# Patient Record
Sex: Male | Born: 1940 | Race: White | Hispanic: No | Marital: Married | Smoking: Former smoker
Health system: Southern US, Community
[De-identification: ages and names within clinical notes are randomized; demographics above are authoritative.]

## PROBLEM LIST (undated history)

## (undated) DIAGNOSIS — C679 Malignant neoplasm of bladder, unspecified: Secondary | ICD-10-CM

## (undated) DIAGNOSIS — G039 Meningitis, unspecified: Secondary | ICD-10-CM

## (undated) DIAGNOSIS — F0390 Unspecified dementia without behavioral disturbance: Secondary | ICD-10-CM

## (undated) DIAGNOSIS — Z789 Other specified health status: Secondary | ICD-10-CM

## (undated) DIAGNOSIS — Z87442 Personal history of urinary calculi: Secondary | ICD-10-CM

## (undated) DIAGNOSIS — N189 Chronic kidney disease, unspecified: Secondary | ICD-10-CM

## (undated) HISTORY — PX: OTHER SURGICAL HISTORY: SHX169

## (undated) HISTORY — PX: HERNIA REPAIR: SHX51

## (undated) HISTORY — PX: TONSILLECTOMY: SUR1361

---

## 2002-12-02 ENCOUNTER — Ambulatory Visit (HOSPITAL_BASED_OUTPATIENT_CLINIC_OR_DEPARTMENT_OTHER): Admission: RE | Admit: 2002-12-02 | Discharge: 2002-12-02 | Payer: Self-pay | Admitting: Surgery

## 2006-09-10 HISTORY — PX: OTHER SURGICAL HISTORY: SHX169

## 2016-12-20 ENCOUNTER — Other Ambulatory Visit: Payer: Self-pay | Admitting: Gastroenterology

## 2017-02-25 ENCOUNTER — Encounter (HOSPITAL_COMMUNITY): Payer: Self-pay | Admitting: *Deleted

## 2017-02-26 ENCOUNTER — Ambulatory Visit (HOSPITAL_COMMUNITY): Payer: Medicare Other | Admitting: Anesthesiology

## 2017-02-26 ENCOUNTER — Ambulatory Visit (HOSPITAL_COMMUNITY)
Admission: RE | Admit: 2017-02-26 | Discharge: 2017-02-26 | Disposition: A | Discharge status: Home / Self Care | Payer: Medicare Other | Source: Ambulatory Visit | Attending: Gastroenterology | Admitting: Gastroenterology

## 2017-02-26 ENCOUNTER — Encounter (HOSPITAL_COMMUNITY): Payer: Self-pay | Admitting: Emergency Medicine

## 2017-02-26 ENCOUNTER — Encounter (HOSPITAL_COMMUNITY)
Admission: RE | Disposition: A | Discharge status: Home / Self Care | Payer: Self-pay | Source: Ambulatory Visit | Attending: Gastroenterology

## 2017-02-26 DIAGNOSIS — Z1211 Encounter for screening for malignant neoplasm of colon: Secondary | ICD-10-CM | POA: Diagnosis not present

## 2017-02-26 DIAGNOSIS — Z79899 Other long term (current) drug therapy: Secondary | ICD-10-CM | POA: Insufficient documentation

## 2017-02-26 DIAGNOSIS — Z87891 Personal history of nicotine dependence: Secondary | ICD-10-CM | POA: Insufficient documentation

## 2017-02-26 HISTORY — PX: COLONOSCOPY WITH PROPOFOL: SHX5780

## 2017-02-26 HISTORY — DX: Other specified health status: Z78.9

## 2017-02-26 SURGERY — COLONOSCOPY WITH PROPOFOL
Anesthesia: Monitor Anesthesia Care

## 2017-02-26 MED ORDER — ONDANSETRON HCL 4 MG/2ML IJ SOLN
INTRAMUSCULAR | Status: AC
  Filled 2017-02-26: qty 2

## 2017-02-26 MED ORDER — LIDOCAINE HCL (CARDIAC) 20 MG/ML IV SOLN
INTRAVENOUS | Status: DC | PRN
  Administered 2017-02-26: 50 mg via INTRAVENOUS

## 2017-02-26 MED ORDER — PROPOFOL 10 MG/ML IV BOLUS
INTRAVENOUS | Status: DC | PRN
  Administered 2017-02-26: 50 mg via INTRAVENOUS

## 2017-02-26 MED ORDER — SODIUM CHLORIDE 0.9 % IV SOLN: INTRAVENOUS | Status: DC

## 2017-02-26 MED ORDER — PROPOFOL 10 MG/ML IV BOLUS
INTRAVENOUS | Status: AC
  Filled 2017-02-26: qty 40

## 2017-02-26 MED ORDER — LACTATED RINGERS IV SOLN
INTRAVENOUS | Status: DC
  Administered 2017-02-26 (×2): via INTRAVENOUS

## 2017-02-26 MED ORDER — LIDOCAINE 2% (20 MG/ML) 5 ML SYRINGE
INTRAMUSCULAR | Status: AC
  Filled 2017-02-26: qty 5

## 2017-02-26 MED ORDER — ONDANSETRON HCL 4 MG/2ML IJ SOLN
INTRAMUSCULAR | Status: DC | PRN
  Administered 2017-02-26: 4 mg via INTRAVENOUS

## 2017-02-26 MED ORDER — PROPOFOL 500 MG/50ML IV EMUL
INTRAVENOUS | Status: DC | PRN
  Administered 2017-02-26: 150 ug/kg/min via INTRAVENOUS

## 2017-02-26 SURGICAL SUPPLY — 22 items

## 2017-02-26 NOTE — Anesthesia Postprocedure Evaluation (Signed)
Anesthesia Post Note  Patient: Benjamin Hensley  Procedure(s) Performed: Procedure(s) (LRB): COLONOSCOPY WITH PROPOFOL (N/A)     Patient location during evaluation: PACU Anesthesia Type: MAC Level of consciousness: awake and alert Pain management: pain level controlled Vital Signs Assessment: post-procedure vital signs reviewed and stable Respiratory status: spontaneous breathing, nonlabored ventilation, respiratory function stable and patient connected to nasal cannula oxygen Cardiovascular status: stable and blood pressure returned to baseline Anesthetic complications: no    Last Vitals:  Vitals:   02/26/17 1030 02/26/17 1040  BP: 118/70 117/66  Pulse: (!) 55 (!) 57  Resp: 15 14  Temp:      Last Pain:  Vitals:   02/26/17 1023  TempSrc: Oral  PainSc:                  [place] Journey S

## 2017-02-26 NOTE — Transfer of Care (Signed)
Immediate Anesthesia Transfer of Care Note  Patient: Benjamin Hensley  Procedure(s) Performed: Procedure(s): COLONOSCOPY WITH PROPOFOL (N/A)  Patient Location: PACU  Anesthesia Type:MAC  Level of Consciousness:  sedated, patient cooperative and responds to stimulation  Airway & Oxygen Therapy:Patient Spontanous Breathing and Patient connected to face mask oxgen  Post-op Assessment:  Report given to PACU RN and Post -op Vital signs reviewed and stable  Post vital signs:  Reviewed and stable  Last Vitals:  Vitals:   02/26/17 0909  BP: (!) 154/68  Pulse: (!) 59  Resp: 12  Temp: 30.8 C    Complications: No apparent anesthesia complications

## 2017-02-26 NOTE — Anesthesia Preprocedure Evaluation (Signed)
Anesthesia Evaluation  Patient identified by MRN, date of birth, ID band Patient awake    Reviewed: Allergy & Precautions, NPO status , Patient's Chart, lab work & pertinent test results  Airway Mallampati: II  TM Distance: >3 FB Neck ROM: Full    Dental no notable dental hx.    Pulmonary neg pulmonary ROS, former smoker,    Pulmonary exam normal breath sounds clear to auscultation       Cardiovascular negative cardio ROS Normal cardiovascular exam Rhythm:Regular Rate:Normal     Neuro/Psych negative neurological ROS  negative psych ROS   GI/Hepatic negative GI ROS, Neg liver ROS,   Endo/Other  negative endocrine ROS  Renal/GU negative Renal ROS  negative genitourinary   Musculoskeletal negative musculoskeletal ROS (+)   Abdominal   Peds negative pediatric ROS (+)  Hematology negative hematology ROS (+)   Anesthesia Other Findings   Reproductive/Obstetrics negative OB ROS                             Anesthesia Physical Anesthesia Plan  ASA: II  Anesthesia Plan: MAC   Post-op Pain Management:    Induction: Intravenous  PONV Risk Score and Plan: 0  Airway Management Planned: Nasal Cannula  Additional Equipment:   Intra-op Plan:   Post-operative Plan:   Informed Consent: I have reviewed the patients History and Physical, chart, labs and discussed the procedure including the risks, benefits and alternatives for the proposed anesthesia with the patient or authorized representative who has indicated his/her understanding and acceptance.   Dental advisory given  Plan Discussed with: CRNA and Surgeon  Anesthesia Plan Comments:         Anesthesia Quick Evaluation

## 2017-02-26 NOTE — Discharge Instructions (Signed)

## 2017-02-26 NOTE — Op Note (Signed)
[facility] Patient Name: Benjamin Hensley Procedure Date: 02/26/2017 MRN: [id number] Attending MD: Garlan Fair , MD Date of Birth: 1941-05-23 CSN: [id number] Age: 76 Admit Type: Outpatient Procedure:                Colonoscopy Indications:              Screening for colorectal malignant neoplasm.                            01/03/2007 normal screening colonoscopy was                            performed. Providers:                Garlan Fair, MD, Laverta Baltimore RN, RN,                            Cherylynn Ridges, Technician Referring MD:              Medicines:                Propofol per Anesthesia Complications:            No immediate complications. Estimated Blood Loss:     Estimated blood loss: none. Procedure:                Pre-Anesthesia Assessment:                           - Prior to the procedure, a History and Physical                            was performed, and patient medications and                            allergies were reviewed. The patient's tolerance of                            previous anesthesia was also reviewed. The risks                            and benefits of the procedure and the sedation                            options and risks were discussed with the patient.                            All questions were answered, and informed consent                            was obtained. Prior Anticoagulants: The patient has                            taken aspirin, last dose was 1 day prior to                            procedure. ASA Grade Assessment:  II - A patient                            with mild systemic disease. After reviewing the                            risks and benefits, the patient was deemed in                            satisfactory condition to undergo the procedure.                           After obtaining informed consent, the colonoscope                            was passed under direct vision. Throughout  the                            procedure, the patient's blood pressure, pulse, and                            oxygen saturations were monitored continuously. The                            EC-[id number] ([id number]) scope was introduced through                            the anus and advanced to the the cecum, identified                            by appendiceal orifice and ileocecal valve. The                            colonoscopy was performed without difficulty. The                            patient tolerated the procedure well. The quality                            of the bowel preparation was good. The appendiceal                            orifice and the rectum were photographed. Scope In: 9:49:20 AM Scope Out: 10:11:34 AM Scope Withdrawal Time: 0 hours 14 minutes 48 seconds  Total Procedure Duration: 0 hours 22 minutes 14 seconds  Findings:      The perianal and digital rectal examinations were normal.      The entire examined colon appeared normal. Impression:               - The entire examined colon is normal.                           - No specimens collected. Moderate Sedation:      N/A- Per Anesthesia Care Recommendation:           -  Patient has a contact number available for                            emergencies. The signs and symptoms of potential                            delayed complications were discussed with the                            patient. Return to normal activities tomorrow.                            Written discharge instructions were provided to the                            patient.                           - Repeat colonoscopy is not recommended for                            screening purposes.                           - Resume previous diet.                           - Continue present medications. Procedure Code(s):        --- Professional ---                           Y4825, Colorectal cancer screening; colonoscopy on                             individual not meeting criteria for high risk Diagnosis Code(s):        --- Professional ---                           Z12.11, Encounter for screening for malignant                            neoplasm of colon CPT copyright 2016 American Medical Association. All rights reserved. The codes documented in this report are preliminary and upon coder review may  be revised to meet current compliance requirements. Earle Gell, MD Garlan Fair, MD 02/26/2017 10:17:08 AM This report has been signed electronically. Number of Addenda: 0

## 2017-02-26 NOTE — H&P (Signed)
Procedure: Screening colonoscopy. 01/03/2007 normal screening colonoscopy was performed  History: The patient is a 76 year old male born May 09, 1941. He is scheduled to undergo a repeat screening colonoscopy today.  Past medical history: Tonsillectomy. Right inguinal hernia repair. Hypertriglyceridemia. Seborrheic dermatitis. Intermittent asthma. Benign prostatic hypertrophy.  Exam: The patient is alert and lying comfortably on the endoscopy stretcher. Abdomen is soft and nontender to palpation. Lungs are clear to auscultation. Cardiac exam reveals a regular rhythm.  Plan: Proceed with screening colonoscopy

## 2017-02-27 ENCOUNTER — Encounter (HOSPITAL_COMMUNITY): Payer: Self-pay | Admitting: Gastroenterology

## 2017-09-18 DIAGNOSIS — B351 Tinea unguium: Secondary | ICD-10-CM | POA: Diagnosis not present

## 2017-12-04 ENCOUNTER — Other Ambulatory Visit: Payer: Self-pay | Admitting: Internal Medicine

## 2019-09-30 ENCOUNTER — Ambulatory Visit: Payer: Medicare Other | Attending: Internal Medicine

## 2019-09-30 DIAGNOSIS — Z23 Encounter for immunization: Secondary | ICD-10-CM | POA: Insufficient documentation

## 2019-10-19 ENCOUNTER — Ambulatory Visit: Payer: Medicare Other | Attending: Internal Medicine

## 2019-10-19 DIAGNOSIS — Z23 Encounter for immunization: Secondary | ICD-10-CM | POA: Insufficient documentation

## 2019-10-19 NOTE — Progress Notes (Signed)
   Covid-19 Vaccination Clinic  Name:  Benjamin Hensley    MRN: [id number] DOB: June 03, 1941  10/19/2019  [name] was observed post Covid-19 immunization for 15 minutes without incidence. He was provided with Vaccine Information Sheet and instruction to access the V-Safe system.   [name] was instructed to call 911 with any severe reactions post vaccine: Marland Kitchen Difficulty breathing  . Swelling of your face and throat  . A fast heartbeat  . A bad rash all over your body  . Dizziness and weakness    Immunizations Administered    Name Date Dose VIS Date Route   Pfizer COVID-19 Vaccine 10/19/2019  3:26 PM 0.3 mL 08/21/2019 Intramuscular   Manufacturer: [place]   Lot: [id number]   [place]: [address]-7

## 2020-09-21 DIAGNOSIS — E782 Mixed hyperlipidemia: Secondary | ICD-10-CM | POA: Diagnosis not present

## 2020-09-21 DIAGNOSIS — N1831 Chronic kidney disease, stage 3a: Secondary | ICD-10-CM | POA: Diagnosis not present

## 2020-09-21 DIAGNOSIS — Z0000 Encounter for general adult medical examination without abnormal findings: Secondary | ICD-10-CM | POA: Diagnosis not present

## 2020-09-21 DIAGNOSIS — Z1389 Encounter for screening for other disorder: Secondary | ICD-10-CM | POA: Diagnosis not present

## 2020-09-23 DIAGNOSIS — N1831 Chronic kidney disease, stage 3a: Secondary | ICD-10-CM | POA: Diagnosis not present

## 2020-09-23 DIAGNOSIS — N189 Chronic kidney disease, unspecified: Secondary | ICD-10-CM | POA: Diagnosis not present

## 2021-04-07 DIAGNOSIS — R109 Unspecified abdominal pain: Secondary | ICD-10-CM | POA: Diagnosis not present

## 2021-04-07 DIAGNOSIS — R310 Gross hematuria: Secondary | ICD-10-CM | POA: Diagnosis not present

## 2021-04-13 ENCOUNTER — Other Ambulatory Visit: Payer: Self-pay | Admitting: Internal Medicine

## 2021-04-13 ENCOUNTER — Ambulatory Visit
Admission: RE | Admit: 2021-04-13 | Discharge: 2021-04-13 | Disposition: A | Discharge status: Home / Self Care | Payer: Medicare Other | Source: Ambulatory Visit | Attending: Internal Medicine | Admitting: Internal Medicine

## 2021-04-13 DIAGNOSIS — R310 Gross hematuria: Secondary | ICD-10-CM

## 2021-04-13 DIAGNOSIS — J8410 Pulmonary fibrosis, unspecified: Secondary | ICD-10-CM | POA: Diagnosis not present

## 2021-04-13 DIAGNOSIS — R935 Abnormal findings on diagnostic imaging of other abdominal regions, including retroperitoneum: Secondary | ICD-10-CM | POA: Diagnosis not present

## 2021-04-24 ENCOUNTER — Other Ambulatory Visit: Payer: Self-pay | Admitting: Urology

## 2021-04-24 DIAGNOSIS — D414 Neoplasm of uncertain behavior of bladder: Secondary | ICD-10-CM | POA: Diagnosis not present

## 2021-04-24 DIAGNOSIS — K429 Umbilical hernia without obstruction or gangrene: Secondary | ICD-10-CM | POA: Diagnosis not present

## 2021-04-24 DIAGNOSIS — R319 Hematuria, unspecified: Secondary | ICD-10-CM | POA: Diagnosis not present

## 2021-04-24 DIAGNOSIS — I700 Atherosclerosis of aorta: Secondary | ICD-10-CM | POA: Diagnosis not present

## 2021-04-24 DIAGNOSIS — R310 Gross hematuria: Secondary | ICD-10-CM | POA: Diagnosis not present

## 2021-04-24 NOTE — Progress Notes (Signed)
DUE TO COVID-19 ONLY ONE VISITOR IS ALLOWED TO COME WITH YOU AND STAY IN THE WAITING ROOM ONLY DURING PRE OP AND PROCEDURE DAY OF SURGERY. THE 1 VISITOR  MAY VISIT WITH YOU AFTER SURGERY IN YOUR PRIVATE ROOM DURING VISITING HOURS ONLY!  YOU NEED TO HAVE A COVID 19 TEST ON___8/19/2022 ____ @_______ , THIS TEST MUST BE DONE BEFORE SURGERY, SEe map.                Benjamin Hensley  04/24/2021   Your procedure is scheduled on:  05/02/21   Report to [facility] Main  Entrance   Report to admitting at    1100 AM     Call this number if you have problems the morning of surgery [phone]    Remember: Do not eat food , candy gum or mints :After Midnight. You may have clear liquids from midnight until     CLEAR LIQUID DIET   Foods Allowed                                                                       Coffee and tea, regular and decaf                              Plain Jell-O any favor except red or purple                                            Fruit ices (not with fruit pulp)                                      Iced Popsicles                                     Carbonated beverages, regular and diet                                    Cranberry, grape and apple juices Sports drinks like Gatorade Lightly seasoned clear broth or consume(fat free) Sugar, honey syrup   _____________________________________________________________________    BRUSH YOUR TEETH MORNING OF SURGERY AND RINSE YOUR MOUTH OUT, NO CHEWING GUM CANDY OR MINTS.     Take these medicines the morning of surgery with A SIP OF WATER:   DO NOT TAKE ANY DIABETIC MEDICATIONS DAY OF YOUR SURGERY                               You may not have any metal on your body including hair pins and              piercings  Do not wear jewelry, make-up, lotions, powders or perfumes, deodorant             Do not wear nail polish on your  fingernails.  Do not shave  48 hours prior to surgery.              Men may  shave face and neck.   Do not bring valuables to the hospital. [place].  Contacts, dentures or bridgework may not be worn into surgery.  Leave suitcase in the car. After surgery it may be brought to your room.     Patients discharged the day of surgery will not be allowed to drive home. IF YOU ARE HAVING SURGERY AND GOING HOME THE SAME DAY, YOU MUST HAVE AN ADULT TO DRIVE YOU HOME AND BE WITH YOU FOR 24 HOURS. YOU MAY GO HOME BY TAXI OR UBER OR ORTHERWISE, BUT AN ADULT MUST ACCOMPANY YOU HOME AND STAY WITH YOU FOR 24 HOURS.  Name and phone number of your driver:  Special Instructions: N/A              Please read over the following fact sheets you were given: _____________________________________________________________________  [facility] - Preparing for Surgery Before surgery, you can play an important role.  Because skin is not sterile, your skin needs to be as free of germs as possible.  You can reduce the number of germs on your skin by washing with CHG (chlorahexidine gluconate) soap before surgery.  CHG is an antiseptic cleaner which kills germs and bonds with the skin to continue killing germs even after washing. Please DO NOT use if you have an allergy to CHG or antibacterial soaps.  If your skin becomes reddened/irritated stop using the CHG and inform your nurse when you arrive at Short Stay. Do not shave (including legs and underarms) for at least 48 hours prior to the first CHG shower.  You may shave your face/neck. Please follow these instructions carefully:  1.  Shower with CHG Soap the night before surgery and the  morning of Surgery.  2.  If you choose to wash your hair, wash your hair first as usual with your  normal  shampoo.  3.  After you shampoo, rinse your hair and body thoroughly to remove the  shampoo.                           4.  Use CHG as you would any other liquid soap.  You can apply chg directly  to the skin and  wash                       Gently with a scrungie or clean washcloth.  5.  Apply the CHG Soap to your body ONLY FROM THE NECK DOWN.   Do not use on face/ open                           Wound or open sores. Avoid contact with eyes, ears mouth and genitals (private parts).                       Wash face,  Genitals (private parts) with your normal soap.             6.  Wash thoroughly, paying special attention to the area where your surgery  will be performed.  7.  Thoroughly rinse your body with warm water from the neck down.  8.  DO NOT shower/wash with your normal soap after using and rinsing off  the CHG Soap.                9.  Pat yourself dry with a clean towel.            10.  Wear clean pajamas.            11.  Place clean sheets on your bed the night of your first shower and do not  sleep with pets. Day of Surgery : Do not apply any lotions/deodorants the morning of surgery.  Please wear clean clothes to the hospital/surgery center.  FAILURE TO FOLLOW THESE INSTRUCTIONS MAY RESULT IN THE CANCELLATION OF YOUR SURGERY PATIENT SIGNATURE_________________________________  NURSE SIGNATURE__________________________________  ________________________________________________________________________

## 2021-04-26 ENCOUNTER — Other Ambulatory Visit: Payer: Self-pay

## 2021-04-26 ENCOUNTER — Encounter (HOSPITAL_COMMUNITY): Payer: Self-pay

## 2021-04-26 ENCOUNTER — Encounter (HOSPITAL_COMMUNITY)
Admission: RE | Admit: 2021-04-26 | Discharge: 2021-04-26 | Disposition: A | Discharge status: Home / Self Care | Payer: Medicare Other | Source: Ambulatory Visit | Attending: Urology | Admitting: Urology

## 2021-04-26 DIAGNOSIS — Z79899 Other long term (current) drug therapy: Secondary | ICD-10-CM | POA: Insufficient documentation

## 2021-04-26 DIAGNOSIS — Z01812 Encounter for preprocedural laboratory examination: Secondary | ICD-10-CM | POA: Insufficient documentation

## 2021-04-26 HISTORY — DX: Meningitis, unspecified: G03.9

## 2021-04-26 LAB — BASIC METABOLIC PANEL
Anion gap: 5 (ref 5–15)
BUN: 19 mg/dL (ref 8–23)
CO2: 27 mmol/L (ref 22–32)
Calcium: 9 mg/dL (ref 8.9–10.3)
Chloride: 106 mmol/L (ref 98–111)
Creatinine, Ser: 1.28 mg/dL — ABNORMAL HIGH (ref 0.61–1.24)
GFR, Estimated: 57 mL/min — ABNORMAL LOW (ref >60)
Glucose, Bld: 83 mg/dL (ref 70–99)
Potassium: 4.8 mmol/L (ref 3.5–5.1)
Sodium: 138 mmol/L (ref 135–145)

## 2021-04-26 LAB — CBC
HCT: 45.9 % (ref 39.0–52.0)
Hemoglobin: 15.1 g/dL (ref 13.0–17.0)
MCH: 28.9 pg (ref 26.0–34.0)
MCHC: 32.9 g/dL (ref 30.0–36.0)
MCV: 87.8 fL (ref 80.0–100.0)
Platelets: 153 10*3/uL (ref 150–400)
RBC: 5.23 MIL/uL (ref 4.22–5.81)
RDW: 13 % (ref 11.5–15.5)
WBC: 6.9 10*3/uL (ref 4.0–10.5)
nRBC: 0 % (ref 0.0–0.2)

## 2021-04-26 NOTE — Progress Notes (Addendum)
Anesthesia Review:  PCP: DR Lavone Orn  Cardiologist : none  Chest x-ray : 04/14/2021  EKG : Echo : Stress test: Cardiac Cath :  Activity level: can do a flight of stairs without difficulty  Sleep Study/ CPAP : none  Fasting Blood Sugar :      / Checks Blood Sugar -- times a day:   Blood Thinner/ Instructions /Last Dose: ASA / Instructions/ Last Dose :   Positive home test for covid on 03/27/2021.  Pt was not seen by MD and did not contact any MD.  PT states he tested negative at home 7 days later per pt  Covid Test 04/28/2021.

## 2021-04-28 ENCOUNTER — Other Ambulatory Visit: Payer: Self-pay | Admitting: Urology

## 2021-04-28 LAB — SARS CORONAVIRUS 2 (TAT 6-24 HRS): SARS Coronavirus 2: NEGATIVE

## 2021-05-01 NOTE — H&P (Signed)
I have blood in my urine.     Benjamin Hensley is sent by Dr. Laurann Montana for intermittent gross hematuria over the last 10 days. It is worse with walking and activity. He has had no dysuria. He has no increased frequency except with increased fluid intake. He has some intermittent right flank pain for years that is not severe. He has no prior GU history. 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 He had an abdominal US in 1/22 that showed a 138ml prostate and normal kidneys. A KUB was clear at that time. IPSS is 6.     CC: AUA Questions Scoring.  HPI: Benjamin Hensley is a 80 year-old male patient who was referred by Dr. Delanna Ahmadi, MD who is here for evaluation.      AUA Symptom Score: Less than 20% of the time he has the sensation of not emptying his bladder completely when finished urinating. Less than 20% of the time he has to urinate again fewer than two hours after he has finished urinating. He does not have to stop and start again several times when he urinates. He never finds it difficult to postpone urination. Less than 50% of the time he has a weak urinary stream. He never has to push or strain to begin urination. He has to get up to urinate 2 times from the time he goes to bed until the time he gets up in the morning.   Calculated AUA Symptom Score: 6    ALLERGIES: None   MEDICATIONS: Atorvastatin Calcium  Sertraline Hcl     GU PSH: None   NON-GU PSH: Hernia Repair - 2018     GU PMH: None   NON-GU PMH: None   FAMILY HISTORY: Death In The Family Father - Other Death In The Family Mother - Other High Blood Pressure - Father   SOCIAL HISTORY: Marital Status: Married Preferred Language: English; Ethnicity: Not Hispanic Or Latino; Race: White Current Smoking Status: Patient does not smoke anymore. Has not smoked since 04/25/1991.   Tobacco Use Assessment Completed: Used Tobacco in last 30 days? Does not use smokeless tobacco. Does not use drugs. Drinks 4+ caffeinated drinks  per day.    REVIEW OF SYSTEMS:    GU Review Male:   Patient reports frequent urination and get up at night to urinate. Patient denies hard to postpone urination, burning/ pain with urination, leakage of urine, stream starts and stops, trouble starting your stream, have to strain to urinate , erection problems, and penile pain.  Gastrointestinal (Upper):   Patient denies nausea, vomiting, and indigestion/ heartburn.  Gastrointestinal (Lower):   Patient denies diarrhea and constipation.  Constitutional:   Patient denies fever, night sweats, weight loss, and fatigue.  Skin:   Patient denies skin rash/ lesion and itching.  Eyes:   Patient denies blurred vision and double vision.  Ears/ Nose/ Throat:   Patient denies sore throat and sinus problems.  Hematologic/Lymphatic:   Patient denies swollen glands and easy bruising.  Cardiovascular:   Patient denies leg swelling and chest pains.  Respiratory:   Patient denies cough and shortness of breath.  Endocrine:   Patient denies excessive thirst.  Musculoskeletal:   Patient denies back pain and joint pain.  Neurological:   Patient denies headaches and dizziness.  Psychologic:   Patient denies depression and anxiety.   Notes: Blood in ruine    VITAL SIGNS:      04/24/2021 09:19 AM  Weight 186 lb / 84.37 kg  Height 71 in / 180.34 cm  BP 147/73 mmHg  Pulse 73 /min  Temperature 97.8 F / 36.5 C  BMI 25.9 kg/m   GU PHYSICAL EXAMINATION:    Anus and Perineum: No hemorrhoids. No anal stenosis. No rectal fissure, no anal fissure. No edema, no dimple, no perineal tenderness, no anal tenderness.  Scrotum: No lesions. No edema. No cysts. No warts.  Epididymides: Right: no spermatocele, no masses, no cysts, no tenderness, no induration, no enlargement. Left: no spermatocele, no masses, no cysts, no tenderness, no induration, no enlargement.  Testes: No tenderness, no swelling, no enlargement left testes. No tenderness, no swelling, no enlargement right  testes. Normal location left testes. Normal location right testes. No mass, no cyst, no varicocele, no hydrocele left testes. No mass, no cyst, no varicocele, no hydrocele right testes.  Urethral Meatus: Normal size. No lesion, no wart, no discharge, no polyp. Normal location.  Penis: Circumcised, no warts, no cracks. No dorsal Peyronie's plaques, no left corporal Peyronie's plaques, no right corporal Peyronie's plaques, no scarring, no warts. No balanitis, no meatal stenosis.  Prostate: Prostate 2 + size. Left lobe normal consistency, right lobe normal consistency. Symmetrical lobes. No prostate nodule. Left lobe no tenderness, right lobe no tenderness.   Seminal Vesicles: Nonpalpable.  Sphincter Tone: Normal sphincter. No rectal tenderness. No rectal mass.    MULTI-SYSTEM PHYSICAL EXAMINATION:    Constitutional: Well-nourished. No physical deformities. Normally developed. Good grooming.  Neck: Neck symmetrical, not swollen. Normal tracheal position.  Respiratory: Normal breath sounds. No labored breathing, no use of accessory muscles.   Cardiovascular: Regular rate and rhythm. No murmur, no gallop. Normal temperature,, no swelling, no varicosities.   Lymphatic: No enlargement, no tenderness of supraclavicular, groin or neck lymph nodes.  Skin: No paleness, no jaundice, no cyanosis. No lesion, no ulcer, no rash.  Neurologic / Psychiatric: Oriented to time, oriented to place, oriented to person. No depression, no anxiety, no agitation.  Gastrointestinal: Umbilical hernia. No mass, no tenderness, no rigidity, non obese abdomen.   Musculoskeletal: Normal gait and station of head and neck.     Complexity of Data:  Lab Test Review:   BMP  Records Review:   AUA Symptom Score, Previous Doctor Records  Urine Test Review:   Urinalysis  X-Ray Review: Renal Ultrasound: Reviewed Films. Reviewed Report. Discussed With Patient.  C.T. Hematuria: Reviewed Films. Reviewed Report. Discussed With Patient.      PROCEDURES:         C.T. 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 There is a second lesion that is smaller on the right bladder neck posteriorly.       OMNIPAQUE [address] Patient confirmed No Neulasta OnPro Device.         Urinalysis w/Scope Dipstick Dipstick Cont'd Micro  Color: Yellow Bilirubin: Neg mg/dL WBC/hpf: 0 - 5/hpf  Appearance: Clear Ketones: Neg mg/dL RBC/hpf: 3 - 10/hpf  Specific Gravity: 1.020 Blood: 1+ ery/uL Bacteria: Rare (0-9/hpf)  pH: 7.0 Protein: Trace mg/dL Cystals: NS (Not Seen)  Glucose: Neg mg/dL Urobilinogen: 0.2 mg/dL Casts: NS (Not Seen)    Nitrites: Neg Trichomonas: Not Present    Leukocyte Esterase: Neg leu/uL Mucous: Not Present      Epithelial Cells: NS (Not Seen)      Yeast: NS (Not Seen)      Sperm: Not Present    ASSESSMENT:  ICD-10 Details  1 GU:   Gross hematuria - Z61.0 Acute, Uncomplicated - He has intermittent gross hematuria with what 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 I will get him set up for a TURBT with possible gemcitabine. I have reviewed the risks of bleeding, infection, bladder wall, ureteral and urethral injury, chemical cystitis, thrombotic events and anesthetic complications.   2   Bladder tumor/neoplasm - D41.4 Undiagnosed New Problem   PLAN:           Orders Labs Urine Cytology  X-Rays: C.T. Hematuria With and Without I.V. Contrast - He has ongoing gross hematuria and needs a CT. Recent Cr on 8/4 was 1.33.   X-Ray Notes: History:  Hematuria: Yes/No  Patient to see MD after exam: Yes/No  Previous exam: CT / IVP/ US/ KUB/ None  When:  Where:  Diabetic: Yes/ No  BUN/ Creatine:  Date of last BUN Creatinine:  Weight in pounds:  Allergy- Contrasts/ Shellfish: Yes/ No  Conflicting diabetic meds: Yes/ No  Oral  contrast and instructions given to patient:   Prior Authorization #: NPCR            Schedule Return Visit/Planned Activity: ASAP - Schedule Surgery  Procedure: Unspecified Date - Cystoscopy TURBT 2-5 cm - 96045 Notes: asap.          Document Letter(s):  Created for Patient: Clinical Summary

## 2021-05-02 ENCOUNTER — Encounter (HOSPITAL_COMMUNITY)
Admission: RE | Disposition: A | Discharge status: Home / Self Care | Payer: Self-pay | Source: Home / Self Care | Attending: Urology

## 2021-05-02 ENCOUNTER — Observation Stay (HOSPITAL_COMMUNITY)
Admission: RE | Admit: 2021-05-02 | Discharge: 2021-05-03 | Disposition: A | Discharge status: Home / Self Care | Payer: Medicare Other | Attending: Urology | Admitting: Urology

## 2021-05-02 ENCOUNTER — Ambulatory Visit (HOSPITAL_COMMUNITY): Payer: Medicare Other | Admitting: Anesthesiology

## 2021-05-02 ENCOUNTER — Encounter (HOSPITAL_COMMUNITY): Payer: Self-pay | Admitting: Urology

## 2021-05-02 ENCOUNTER — Ambulatory Visit (HOSPITAL_COMMUNITY): Payer: Medicare Other | Admitting: Emergency Medicine

## 2021-05-02 ENCOUNTER — Other Ambulatory Visit: Payer: Self-pay

## 2021-05-02 DIAGNOSIS — C672 Malignant neoplasm of lateral wall of bladder: Secondary | ICD-10-CM | POA: Diagnosis not present

## 2021-05-02 DIAGNOSIS — N3289 Other specified disorders of bladder: Secondary | ICD-10-CM | POA: Diagnosis not present

## 2021-05-02 DIAGNOSIS — C678 Malignant neoplasm of overlapping sites of bladder: Secondary | ICD-10-CM | POA: Diagnosis not present

## 2021-05-02 DIAGNOSIS — R310 Gross hematuria: Secondary | ICD-10-CM | POA: Diagnosis not present

## 2021-05-02 DIAGNOSIS — Z87891 Personal history of nicotine dependence: Secondary | ICD-10-CM | POA: Insufficient documentation

## 2021-05-02 DIAGNOSIS — G039 Meningitis, unspecified: Secondary | ICD-10-CM | POA: Diagnosis not present

## 2021-05-02 DIAGNOSIS — C675 Malignant neoplasm of bladder neck: Secondary | ICD-10-CM | POA: Insufficient documentation

## 2021-05-02 HISTORY — PX: TRANSURETHRAL RESECTION OF BLADDER TUMOR WITH MITOMYCIN-C: SHX6459

## 2021-05-02 SURGERY — TRANSURETHRAL RESECTION OF BLADDER TUMOR WITH MITOMYCIN-C
Anesthesia: General

## 2021-05-02 MED ORDER — ONDANSETRON HCL 4 MG/2ML IJ SOLN: 4.0000 mg | INTRAMUSCULAR | Status: DC | PRN

## 2021-05-02 MED ORDER — LIDOCAINE 2% (20 MG/ML) 5 ML SYRINGE
INTRAMUSCULAR | Status: AC
  Filled 2021-05-02: qty 5

## 2021-05-02 MED ORDER — CEFAZOLIN SODIUM-DEXTROSE 2-4 GM/100ML-% IV SOLN
2.0000 g | INTRAVENOUS | Status: AC
  Administered 2021-05-02: 2 g via INTRAVENOUS

## 2021-05-02 MED ORDER — LIDOCAINE 2% (20 MG/ML) 5 ML SYRINGE
INTRAMUSCULAR | Status: DC | PRN
Start: 2021-05-02 — End: 2021-05-02
  Administered 2021-05-02: 80 mg via INTRAVENOUS
  Administered 2021-05-02: 20 mg via INTRAVENOUS

## 2021-05-02 MED ORDER — FENTANYL CITRATE (PF) 100 MCG/2ML IJ SOLN: 25.0000 ug | INTRAMUSCULAR | Status: DC | PRN

## 2021-05-02 MED ORDER — ORAL CARE MOUTH RINSE: 15.0000 mL | Freq: Once | OROMUCOSAL | Status: AC

## 2021-05-02 MED ORDER — GEMCITABINE CHEMO FOR BLADDER INSTILLATION 2000 MG: 2000.0000 mg | Freq: Once | INTRAVENOUS | Status: DC

## 2021-05-02 MED ORDER — ONDANSETRON HCL 4 MG/2ML IJ SOLN: 4.0000 mg | Freq: Once | INTRAMUSCULAR | Status: DC | PRN

## 2021-05-02 MED ORDER — CHLORHEXIDINE GLUCONATE 0.12 % MT SOLN
15.0000 mL | Freq: Once | OROMUCOSAL | Status: AC
  Administered 2021-05-02: 15 mL via OROMUCOSAL

## 2021-05-02 MED ORDER — FENTANYL CITRATE (PF) 100 MCG/2ML IJ SOLN
INTRAMUSCULAR | Status: AC
  Filled 2021-05-02: qty 2

## 2021-05-02 MED ORDER — OXYCODONE HCL 5 MG/5ML PO SOLN
5.0000 mg | Freq: Once | ORAL | Status: DC | PRN
Start: 2021-05-02 — End: 2021-05-02

## 2021-05-02 MED ORDER — HYOSCYAMINE SULFATE 0.125 MG SL SUBL
0.1250 mg | SUBLINGUAL_TABLET | SUBLINGUAL | Status: DC | PRN
  Filled 2021-05-02: qty 1

## 2021-05-02 MED ORDER — ROCURONIUM BROMIDE 10 MG/ML (PF) SYRINGE
PREFILLED_SYRINGE | INTRAVENOUS | Status: DC | PRN
  Administered 2021-05-02: 60 mg via INTRAVENOUS

## 2021-05-02 MED ORDER — BISACODYL 10 MG RE SUPP: 10.0000 mg | Freq: Every day | RECTAL | Status: DC | PRN

## 2021-05-02 MED ORDER — ROCURONIUM BROMIDE 10 MG/ML (PF) SYRINGE
PREFILLED_SYRINGE | INTRAVENOUS | Status: AC
  Filled 2021-05-02: qty 10

## 2021-05-02 MED ORDER — HYDROCODONE-ACETAMINOPHEN 5-325 MG PO TABS: 1.0000 | ORAL_TABLET | Freq: Four times a day (QID) | ORAL | 0 refills | Status: DC | PRN

## 2021-05-02 MED ORDER — OXYCODONE HCL 5 MG PO TABS: 5.0000 mg | ORAL_TABLET | ORAL | Status: DC | PRN

## 2021-05-02 MED ORDER — DEXAMETHASONE SODIUM PHOSPHATE 10 MG/ML IJ SOLN
INTRAMUSCULAR | Status: DC | PRN
  Administered 2021-05-02: 4 mg via INTRAVENOUS

## 2021-05-02 MED ORDER — HYDROMORPHONE HCL 1 MG/ML IJ SOLN: 0.5000 mg | INTRAMUSCULAR | Status: DC | PRN

## 2021-05-02 MED ORDER — FENTANYL CITRATE (PF) 100 MCG/2ML IJ SOLN
INTRAMUSCULAR | Status: DC | PRN
  Administered 2021-05-02: 50 ug via INTRAVENOUS

## 2021-05-02 MED ORDER — SENNOSIDES-DOCUSATE SODIUM 8.6-50 MG PO TABS: 1.0000 | ORAL_TABLET | Freq: Every evening | ORAL | Status: DC | PRN

## 2021-05-02 MED ORDER — ONDANSETRON HCL 4 MG/2ML IJ SOLN
INTRAMUSCULAR | Status: AC
  Filled 2021-05-02: qty 2

## 2021-05-02 MED ORDER — GEMCITABINE CHEMO FOR BLADDER INSTILLATION 2000 MG
2000.0000 mg | Freq: Once | INTRAVENOUS | Status: AC
  Administered 2021-05-02: 2000 mg via INTRAVESICAL
  Filled 2021-05-02: qty 2000

## 2021-05-02 MED ORDER — 0.9 % SODIUM CHLORIDE (POUR BTL) OPTIME
TOPICAL | Status: DC | PRN
  Administered 2021-05-02: 1000 mL

## 2021-05-02 MED ORDER — STERILE WATER FOR IRRIGATION IR SOLN
Status: DC | PRN
  Administered 2021-05-02: 500 mL

## 2021-05-02 MED ORDER — ATORVASTATIN CALCIUM 10 MG PO TABS
10.0000 mg | ORAL_TABLET | Freq: Every day | ORAL | Status: DC
  Administered 2021-05-02 – 2021-05-03 (×2): 10 mg via ORAL
  Filled 2021-05-02 (×2): qty 1

## 2021-05-02 MED ORDER — CEFAZOLIN SODIUM-DEXTROSE 2-4 GM/100ML-% IV SOLN
INTRAVENOUS | Status: AC
  Filled 2021-05-02: qty 100

## 2021-05-02 MED ORDER — OXYCODONE HCL 5 MG PO TABS: 5.0000 mg | ORAL_TABLET | Freq: Once | ORAL | Status: DC | PRN

## 2021-05-02 MED ORDER — DEXAMETHASONE SODIUM PHOSPHATE 10 MG/ML IJ SOLN
INTRAMUSCULAR | Status: AC
  Filled 2021-05-02: qty 1

## 2021-05-02 MED ORDER — SODIUM CHLORIDE 0.9% FLUSH
3.0000 mL | Freq: Two times a day (BID) | INTRAVENOUS | Status: DC
  Administered 2021-05-03: 3 mL via INTRAVENOUS

## 2021-05-02 MED ORDER — SODIUM CHLORIDE 0.9 % IR SOLN
Status: DC | PRN
  Administered 2021-05-02: 12000 mL

## 2021-05-02 MED ORDER — LACTATED RINGERS IV SOLN: INTRAVENOUS | Status: DC

## 2021-05-02 MED ORDER — POTASSIUM CHLORIDE IN NACL 20-0.45 MEQ/L-% IV SOLN
INTRAVENOUS | Status: DC
  Filled 2021-05-02 (×2): qty 1000

## 2021-05-02 MED ORDER — SUGAMMADEX SODIUM 200 MG/2ML IV SOLN
INTRAVENOUS | Status: DC | PRN
Start: 2021-05-02 — End: 2021-05-02
  Administered 2021-05-02: 200 mg via INTRAVENOUS

## 2021-05-02 MED ORDER — ONDANSETRON HCL 4 MG/2ML IJ SOLN
INTRAMUSCULAR | Status: DC | PRN
  Administered 2021-05-02: 4 mg via INTRAVENOUS

## 2021-05-02 MED ORDER — SERTRALINE HCL 50 MG PO TABS
50.0000 mg | ORAL_TABLET | Freq: Every day | ORAL | Status: DC
  Administered 2021-05-02 – 2021-05-03 (×2): 50 mg via ORAL
  Filled 2021-05-02 (×2): qty 1

## 2021-05-02 MED ORDER — ACETAMINOPHEN 325 MG PO TABS: 650.0000 mg | ORAL_TABLET | ORAL | Status: DC | PRN

## 2021-05-02 MED ORDER — PROPOFOL 10 MG/ML IV BOLUS
INTRAVENOUS | Status: AC
  Filled 2021-05-02: qty 20

## 2021-05-02 MED ORDER — PROPOFOL 10 MG/ML IV BOLUS
INTRAVENOUS | Status: DC | PRN
  Administered 2021-05-02: 120 mg via INTRAVENOUS
  Administered 2021-05-02: 30 mg via INTRAVENOUS

## 2021-05-02 MED ORDER — FLEET ENEMA 7-19 GM/118ML RE ENEM: 1.0000 | ENEMA | Freq: Once | RECTAL | Status: DC | PRN

## 2021-05-02 SURGICAL SUPPLY — 22 items
BAG DRN RND TRDRP ANRFLXCHMBR (UROLOGICAL SUPPLIES) ×1
BAG URINE DRAIN 2000ML AR STRL (UROLOGICAL SUPPLIES) ×1 IMPLANT
BAG URO CATCHER STRL LF (MISCELLANEOUS) ×2 IMPLANT
CATH FOLEY 2WAY SLVR  5CC 20FR (CATHETERS) ×2
CATH FOLEY 2WAY SLVR 5CC 20FR (CATHETERS) IMPLANT
CATH FOLEY 3WAY 30CC 22FR (CATHETERS) IMPLANT
CATH URET 5FR 28IN OPEN ENDED (CATHETERS) IMPLANT
DRAPE FOOT SWITCH (DRAPES) ×2 IMPLANT
GLOVE SURG POLYISO LF SZ8 (GLOVE) IMPLANT
GOWN STRL REUS W/TWL XL LVL3 (GOWN DISPOSABLE) ×2 IMPLANT
HOLDER FOLEY CATH W/STRAP (MISCELLANEOUS) ×1 IMPLANT
KIT TURNOVER KIT A (KITS) ×2 IMPLANT
LOOP CUT BIPOLAR 24F LRG (ELECTROSURGICAL) IMPLANT
MANIFOLD NEPTUNE II (INSTRUMENTS) ×2 IMPLANT
PACK CYSTO (CUSTOM PROCEDURE TRAY) ×2 IMPLANT
PLUG CATH AND CAP STER (CATHETERS) ×1 IMPLANT
SUT ETHILON 3 0 PS 1 (SUTURE) IMPLANT
SYR 30ML LL (SYRINGE) IMPLANT
SYR TOOMEY IRRIG 70ML (MISCELLANEOUS)
SYRINGE TOOMEY IRRIG 70ML (MISCELLANEOUS) IMPLANT
TUBING CONNECTING 10 (TUBING) ×2 IMPLANT
TUBING UROLOGY SET (TUBING) ×2 IMPLANT

## 2021-05-02 NOTE — Anesthesia Procedure Notes (Signed)
Procedure Name: Intubation Date/Time: 05/02/2021 12:55 PM Performed by: Milford Cage, CRNA Pre-anesthesia Checklist: Patient identified, Emergency Drugs available, Suction available and Patient being monitored Patient Re-evaluated:Patient Re-evaluated prior to induction Oxygen Delivery Method: Circle system utilized Preoxygenation: Pre-oxygenation with 100% oxygen Induction Type: IV induction Ventilation: Mask ventilation without difficulty Laryngoscope Size: Mac and 3 Grade View: Grade I Tube type: Oral Tube size: 7.0 mm Number of attempts: 1 Airway Equipment and Method: Stylet Placement Confirmation: ETT inserted through vocal cords under direct vision, positive ETCO2 and breath sounds checked- equal and bilateral Secured at: 22 cm Tube secured with: Tape Dental Injury: Teeth and Oropharynx as per pre-operative assessment

## 2021-05-02 NOTE — Transfer of Care (Signed)
Immediate Anesthesia Transfer of Care Note  Patient: Benjamin Hensley  Procedure(s) Performed: TRANSURETHRAL RESECTION OF BLADDER TUMOR WITH GEMCITABINE  Patient Location: PACU  Anesthesia Type:General  Level of Consciousness: drowsy  Airway & Oxygen Therapy: Patient Spontanous Breathing and Patient connected to face mask oxygen  Post-op Assessment: Report given to RN and Post -op Vital signs reviewed and stable  Post vital signs: Reviewed and stable  Last Vitals:  Vitals Value Taken Time  BP    Temp    Pulse 71 05/02/21 1355  Resp 16 05/02/21 1355  SpO2 100 % 05/02/21 1355  Vitals shown include unvalidated device data.  Last Pain:  Vitals:   05/02/21 1145  TempSrc: Oral         Complications: No notable events documented.

## 2021-05-02 NOTE — Progress Notes (Signed)
Report received from [facility] PACU RN

## 2021-05-02 NOTE — Plan of Care (Signed)

## 2021-05-02 NOTE — Interval H&P Note (Signed)
History and Physical Interval Note:  I reviewed the Gemcitabine intravesical chemotherapy which may be needed.  I reviewed the side effect associated with that which is primarily chemical cystitis.   05/02/2021 12:26 PM  Benjamin Hensley  has presented today for surgery, with the diagnosis of [place].  The various methods of treatment have been discussed with the patient and family. After consideration of risks, benefits and other options for treatment, the patient has consented to  Procedure(s) with comments: TRANSURETHRAL RESECTION OF BLADDER TUMOR WITH GEMCITABINE (N/A) - GENERAL ANESTHESIA WITH PARALYSIS as a surgical intervention.  The patient's history has been reviewed, patient examined, no change in status, stable for surgery.  I have reviewed the patient's chart and labs.  Questions were answered to the patient's satisfaction.     Irine Seal

## 2021-05-02 NOTE — Discharge Instructions (Addendum)
You may remove the catheter at home on Friday morning by cutting off the side arm with the yellow nipple and after the fluid drains from the catheter balloon the catheter should slide out easily.  If you don't feel you can do that, please call the office and we can arrange an appointment to remove the catheter.

## 2021-05-02 NOTE — Anesthesia Postprocedure Evaluation (Signed)
Anesthesia Post Note  Patient: Benjamin Hensley  Procedure(s) Performed: TRANSURETHRAL RESECTION OF BLADDER TUMOR WITH GEMCITABINE     Patient location during evaluation: PACU Anesthesia Type: General Level of consciousness: awake and alert Pain management: pain level controlled Vital Signs Assessment: post-procedure vital signs reviewed and stable Respiratory status: spontaneous breathing, nonlabored ventilation and respiratory function stable Cardiovascular status: stable and blood pressure returned to baseline Anesthetic complications: no   No notable events documented.  Last Vitals:  Vitals:   05/02/21 1445 05/02/21 1500  BP: 128/69 (!) 143/72  Pulse: (!) 52 64  Resp: 15 16  Temp:    SpO2: 99% 99%    Last Pain:  Vitals:   05/02/21 1500  TempSrc:   PainSc: 0-No pain                 Audry Pili

## 2021-05-02 NOTE — Op Note (Signed)
Procedure: 1.  Cystoscopy with transurethral resection of 1 cm right anterior bladder neck tumor 2.  Transurethral resection of large left lateral wall tumor with a resection defect of 4 x 6 cm. 3.  Instillation of gemcitabine in the PACU.  Preop diagnosis: Gross hematuria with bladder mass on CT.  Postop diagnosis: Small right anterior bladder neck tumor and large left lateral wall tumor.  Surgeon: Dr. Irine Seal.  Anesthesia: General.  Specimen: Tumor chips from right anterior bladder wall tumor and tumor chips from left lateral wall tumor as separate specimens.  Drain: 59 [place] Foley catheter.  EBL: 10 mL.  Complications: None.  Indications: Benjamin Hensley is a 80 year old male who had presented with a history of gross hematuria and was found on CT scan to have a bladder mass on the left lateral wall with a central bulk of the mass approximately 1.5 cm but there was thickening of the bladder wall that extended from the trigone and out lateral from the tumor as well as superior to the tumor that was concerning for possible invasive disease.  There was another small tumor at the bladder neck.  It was felt that tumor resection possible gemcitabine instillation were indicated.  Procedure: He was taken operating room was given antibiotics.  A general anesthetic was induced.  He was intubated and paralyzed.  He was placed in lithotomy position and fitted with PAS hose.  His perineum and genitalia were prepped with Betadine solution he was draped in usual sterile fashion.  Cystoscopy was performed using the 21 [place] scope and 30 and 70 degree lenses.  Examination revealed a normal urethra.  The external sphincter was intact.  The prostatic urethra was approximately 3 to 4 cm in length with bilobar hyperplasia with coaptation and obstruction and a small middle lobe.  At the right anterior bladder neck there was a papillary lesion that was approximately a centimeter in size.  Inspection of bladder  revealed moderate trabeculation with some cellules and diverticuli.  Ureteral orifices were unremarkable.  The CT detected mass on the left lateral wall was identified and was noted to be a fairly solid appearing lobulated mass consistent with high-grade urothelial carcinoma.  Also extending out from the base was abnormal mucosa with some papillary changes also concerning for urothelial CA and possible carcinoma in situ.  The diameter of this lesion was approximately 6 cm extending from near the left ureteral orifice and on the lateral wall and posteriorly.  The cystoscope was removed and the urethra was calibrated to 30 [place] with [employer] sounds.  The 34 French continuous-flow resectoscope sheath was then inserted and fitted with a bipolar loop and 30 degree lens.  Saline was used as the irrigant.  The tumor at the right anterior bladder neck was resected and the specimen was removed.  The tumor bed was then generously fulgurated.  The left lateral wall tumor was then resected.  It was quite vascular.  Once the bulky component of the tumor was resected I then resected an extensive area of bladder wall with abnormalities noted on cystoscopy.  The resection was carried down into the muscle and there were a few areas of fat noted but no evidence of full perforation.  Once the resection was completed the chips were removed and generous fulguration was performed to ensure hemostasis.  There were a couple of other areas that had some worrisome appearing mucosa including 1 small spot on the posterior wall there were also fulgurated.  The resectoscope was  then removed and a 53 [place] Foley catheter was placed with the aid of a catheter guide.  The balloon was filled with 10 mL of sterile fluid and the catheter was hand irrigated with return of 1 small clot but then clear irrigant.  The catheter was placed to drainage.  He was taken down from lithotomy position, his anesthetic was reversed and he was moved recovery  room in stable condition.  In the PACU his bladder was instilled with 2000 mg of gemcitabine and 50 mL of diluent and and then drained after an hour.  He will be discharged home with his Foley.  There were no complications.

## 2021-05-02 NOTE — Anesthesia Preprocedure Evaluation (Addendum)
Anesthesia Evaluation  Patient identified by MRN, date of birth, ID band Patient awake    Reviewed: Allergy & Precautions, NPO status , Patient's Chart, lab work & pertinent test results  History of Anesthesia Complications Negative for: history of anesthetic complications  Airway Mallampati: II  TM Distance: >3 FB Neck ROM: Full    Dental  (+) Dental Advisory Given, Teeth Intact   Pulmonary former smoker,    Pulmonary exam normal        Cardiovascular negative cardio ROS   Rhythm:Regular Rate:Bradycardia     Neuro/Psych  Meningitis as a child  negative psych ROS   GI/Hepatic negative GI ROS, Neg liver ROS,   Endo/Other  negative endocrine ROS  Renal/GU negative Renal ROS    Bladder lesion with hematuria     Musculoskeletal negative musculoskeletal ROS (+)   Abdominal   Peds  Hematology negative hematology ROS (+)   Anesthesia Other Findings Covid test negative   Reproductive/Obstetrics                            Anesthesia Physical Anesthesia Plan  ASA: 2  Anesthesia Plan: General   Post-op Pain Management:    Induction: Intravenous  PONV Risk Score and Plan: 3 and Treatment may vary due to age or medical condition, Ondansetron and Dexamethasone  Airway Management Planned: Oral ETT  Additional Equipment: None  Intra-op Plan:   Post-operative Plan: Extubation in OR  Informed Consent: I have reviewed the patients History and Physical, chart, labs and discussed the procedure including the risks, benefits and alternatives for the proposed anesthesia with the patient or authorized representative who has indicated his/her understanding and acceptance.     Dental advisory given  Plan Discussed with: CRNA and Anesthesiologist  Anesthesia Plan Comments:        Anesthesia Quick Evaluation

## 2021-05-03 ENCOUNTER — Encounter (HOSPITAL_COMMUNITY): Payer: Self-pay | Admitting: Urology

## 2021-05-03 DIAGNOSIS — C675 Malignant neoplasm of bladder neck: Secondary | ICD-10-CM | POA: Diagnosis present

## 2021-05-03 DIAGNOSIS — C672 Malignant neoplasm of lateral wall of bladder: Secondary | ICD-10-CM | POA: Diagnosis not present

## 2021-05-03 DIAGNOSIS — Z87891 Personal history of nicotine dependence: Secondary | ICD-10-CM | POA: Diagnosis not present

## 2021-05-03 MED ORDER — CHLORHEXIDINE GLUCONATE CLOTH 2 % EX PADS
6.0000 | MEDICATED_PAD | Freq: Every day | CUTANEOUS | Status: DC
  Administered 2021-05-03: 6 via TOPICAL

## 2021-05-03 NOTE — Progress Notes (Signed)
Pt discharged home today per Dr. Jeffie Pollock. [name] IV site D/C'd and WDL. [name] VSS. Pt provided with home medication list, discharge instructions and prescriptions. Verbalized understanding. Pt and patient's wife return demonstration of at home foley care using teach back method. Pt left floor via WC in stable condition accompanied by NT.

## 2021-05-03 NOTE — Plan of Care (Signed)
  Problem: Education: Goal: Knowledge of General Education information will improve Description: Including pain rating scale, medication(s)/side effects and non-pharmacologic comfort measures Outcome: Completed/Met   Problem: Clinical Measurements: Goal: Ability to maintain clinical measurements within normal limits will improve Outcome: Completed/Met Goal: Will remain free from infection Outcome: Completed/Met Goal: Diagnostic test results will improve Outcome: Completed/Met Goal: Respiratory complications will improve Outcome: Completed/Met Goal: Cardiovascular complication will be avoided Outcome: Completed/Met   

## 2021-05-03 NOTE — Plan of Care (Signed)

## 2021-05-03 NOTE — Discharge Summary (Signed)
Physician Discharge Summary  Patient ID: Benjamin Hensley MRN: [id number] DOB/AGE: 11-26-1940 80 y.o.  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  He did well and his urine is clearing.  He will be sent home with the foley.   He was given perioperative antibiotics:  Anti-infectives (From admission, onward)    Start     Dose/Rate Route Frequency Ordered Stop   05/02/21 1118  ceFAZolin (ANCEF) 2-4 GM/100ML-% IVPB       Note to Pharmacy: Marchia Meiers   : cabinet override      05/02/21 1118 05/02/21 1304   05/02/21 1108  ceFAZolin (ANCEF) IVPB 2g/100 mL premix        2 g 200 mL/hr over 30 Minutes Intravenous 30 min pre-op 05/02/21 1108 05/02/21 1258     .  He was given sequential compression devices for DVT prophylaxis.  He benefited maximally from the hospital stay and there were no complications.    Recent vital signs:  Vitals:   05/03/21 0503 05/03/21 0830  BP: 98/64 124/66  Pulse: 74 63  Resp: 18 16  Temp: 98.4 F (36.9 C) 98.2 F (36.8 C)  SpO2: 96% 96%    Recent laboratory studies:  Lab Results  Component Value Date   HGB 15.1 04/26/2021   Lab Results  Component Value Date   WBC 6.9 04/26/2021    PLT 153 04/26/2021   No results found for: INR Lab Results  Component Value Date   NA 138 04/26/2021   K 4.8 04/26/2021   CL 106 04/26/2021   CO2 27 04/26/2021   BUN 19 04/26/2021   CREATININE 1.28 (H) 04/26/2021   GLUCOSE 83 04/26/2021    Discharge Medications:   Allergies as of 05/03/2021   No Known Allergies      Medication List     TAKE these medications    acetaminophen 500 MG tablet Commonly known as: TYLENOL Take 1,000 mg by mouth every 6 (six) hours as needed for moderate pain or headache.   atorvastatin 10 MG tablet Commonly known as: LIPITOR Take 10 mg by mouth daily.   HYDROcodone-acetaminophen 5-325 MG tablet Commonly known as: NORCO/VICODIN Take 1 tablet by mouth every 6 (six) hours as needed for moderate pain. Notes to patient: May cause constipation.   May make you dizzy or drowsy. Do not drive or do anything that could be dangerous until you know how this medicine affects you.    sertraline 50 MG tablet Commonly known as: ZOLOFT Take 50 mg by mouth daily.   sildenafil 100 MG tablet Commonly known as: VIAGRA Take 100 mg by mouth daily as needed for erectile dysfunction.        Diagnostic Studies: DG Chest 2 View  Result  Date: 04/14/2021 CLINICAL DATA:  Ex-smoker with gross hematuria EXAM: CHEST - 2 VIEW COMPARISON:  05/15/2018, report only. FINDINGS: Midline trachea. Normal heart size and mediastinal contours. No pleural effusion or pneumothorax. Right upper lobe calcified granuloma of 7 mm. IMPRESSION: No acute cardiopulmonary disease. Electronically Signed   By: Abigail Miyamoto M.D.   On: 04/14/2021 16:53    Disposition: Discharge disposition: 01-Home or Self Care       Discharge Instructions     Discontinue IV   Complete by: As directed         Follow-up Information     Irine Seal, MD. Go on 05/12/2021.   Specialty: Urology Why: @ 3:45 pm Contact information: [place] [address] [phone]                   Signed: Irine Seal 05/03/2021, 12:54 PM

## 2021-05-04 LAB — SURGICAL PATHOLOGY

## 2021-05-12 DIAGNOSIS — C673 Malignant neoplasm of anterior wall of bladder: Secondary | ICD-10-CM | POA: Diagnosis not present

## 2021-05-12 DIAGNOSIS — C672 Malignant neoplasm of lateral wall of bladder: Secondary | ICD-10-CM | POA: Diagnosis not present

## 2021-06-01 DIAGNOSIS — Z23 Encounter for immunization: Secondary | ICD-10-CM | POA: Diagnosis not present

## 2021-06-01 DIAGNOSIS — C672 Malignant neoplasm of lateral wall of bladder: Secondary | ICD-10-CM | POA: Diagnosis not present

## 2021-06-27 DIAGNOSIS — K429 Umbilical hernia without obstruction or gangrene: Secondary | ICD-10-CM | POA: Diagnosis not present

## 2021-06-27 DIAGNOSIS — C673 Malignant neoplasm of anterior wall of bladder: Secondary | ICD-10-CM | POA: Diagnosis not present

## 2021-06-27 DIAGNOSIS — C672 Malignant neoplasm of lateral wall of bladder: Secondary | ICD-10-CM | POA: Diagnosis not present

## 2021-07-17 DIAGNOSIS — H401111 Primary open-angle glaucoma, right eye, mild stage: Secondary | ICD-10-CM | POA: Diagnosis not present

## 2021-07-17 DIAGNOSIS — H524 Presbyopia: Secondary | ICD-10-CM | POA: Diagnosis not present

## 2021-07-17 DIAGNOSIS — Z961 Presence of intraocular lens: Secondary | ICD-10-CM | POA: Diagnosis not present

## 2021-07-18 DIAGNOSIS — K429 Umbilical hernia without obstruction or gangrene: Secondary | ICD-10-CM | POA: Diagnosis not present

## 2021-07-18 DIAGNOSIS — C673 Malignant neoplasm of anterior wall of bladder: Secondary | ICD-10-CM | POA: Diagnosis not present

## 2021-07-19 ENCOUNTER — Other Ambulatory Visit: Payer: Self-pay | Admitting: Urology

## 2021-09-13 DIAGNOSIS — Z01818 Encounter for other preprocedural examination: Secondary | ICD-10-CM | POA: Diagnosis not present

## 2021-09-13 DIAGNOSIS — C673 Malignant neoplasm of anterior wall of bladder: Secondary | ICD-10-CM | POA: Diagnosis not present

## 2021-09-14 DIAGNOSIS — H401111 Primary open-angle glaucoma, right eye, mild stage: Secondary | ICD-10-CM | POA: Diagnosis not present

## 2021-09-21 ENCOUNTER — Inpatient Hospital Stay (HOSPITAL_COMMUNITY)
Admission: RE | Admit: 2021-09-21 | Discharge: 2021-09-28 | DRG: 654 | Disposition: A | Discharge status: Home Health | Payer: Medicare Other | Source: Ambulatory Visit | Attending: Urology | Admitting: Urology

## 2021-09-21 ENCOUNTER — Encounter (HOSPITAL_COMMUNITY): Payer: Self-pay | Admitting: Urology

## 2021-09-21 ENCOUNTER — Other Ambulatory Visit: Payer: Self-pay

## 2021-09-21 DIAGNOSIS — K429 Umbilical hernia without obstruction or gangrene: Secondary | ICD-10-CM | POA: Diagnosis not present

## 2021-09-21 DIAGNOSIS — Z79899 Other long term (current) drug therapy: Secondary | ICD-10-CM

## 2021-09-21 DIAGNOSIS — C679 Malignant neoplasm of bladder, unspecified: Principal | ICD-10-CM | POA: Diagnosis present

## 2021-09-21 DIAGNOSIS — Z87891 Personal history of nicotine dependence: Secondary | ICD-10-CM | POA: Diagnosis not present

## 2021-09-21 DIAGNOSIS — K567 Ileus, unspecified: Secondary | ICD-10-CM | POA: Diagnosis not present

## 2021-09-21 DIAGNOSIS — Z20822 Contact with and (suspected) exposure to covid-19: Secondary | ICD-10-CM | POA: Diagnosis not present

## 2021-09-21 DIAGNOSIS — C61 Malignant neoplasm of prostate: Secondary | ICD-10-CM | POA: Diagnosis present

## 2021-09-21 DIAGNOSIS — D090 Carcinoma in situ of bladder: Secondary | ICD-10-CM | POA: Diagnosis not present

## 2021-09-21 HISTORY — DX: Malignant neoplasm of bladder, unspecified: C67.9

## 2021-09-21 LAB — TYPE AND SCREEN
ABO/RH(D): O POS
Antibody Screen: NEGATIVE

## 2021-09-21 LAB — COMPREHENSIVE METABOLIC PANEL
ALT: 22 U/L (ref 0–44)
AST: 21 U/L (ref 15–41)
Albumin: 4.1 g/dL (ref 3.5–5.0)
Alkaline Phosphatase: 61 U/L (ref 38–126)
Anion gap: 7 (ref 5–15)
BUN: 21 mg/dL (ref 8–23)
CO2: 25 mmol/L (ref 22–32)
Calcium: 9.1 mg/dL (ref 8.9–10.3)
Chloride: 104 mmol/L (ref 98–111)
Creatinine, Ser: 1.24 mg/dL (ref 0.61–1.24)
GFR, Estimated: 60 mL/min — ABNORMAL LOW (ref >60)
Glucose, Bld: 88 mg/dL (ref 70–99)
Potassium: 3.9 mmol/L (ref 3.5–5.1)
Sodium: 136 mmol/L (ref 135–145)
Total Bilirubin: 0.8 mg/dL (ref 0.3–1.2)
Total Protein: 7.1 g/dL (ref 6.5–8.1)

## 2021-09-21 LAB — ABO/RH: ABO/RH(D): O POS

## 2021-09-21 LAB — CBC
HCT: 48.5 % (ref 39.0–52.0)
Hemoglobin: 15.6 g/dL (ref 13.0–17.0)
MCH: 28.3 pg (ref 26.0–34.0)
MCHC: 32.2 g/dL (ref 30.0–36.0)
MCV: 87.9 fL (ref 80.0–100.0)
Platelets: 186 10*3/uL (ref 150–400)
RBC: 5.52 MIL/uL (ref 4.22–5.81)
RDW: 12.9 % (ref 11.5–15.5)
WBC: 6.6 10*3/uL (ref 4.0–10.5)
nRBC: 0 % (ref 0.0–0.2)

## 2021-09-21 LAB — RESP PANEL BY RT-PCR (FLU A&B, COVID) ARPGX2
Influenza A by PCR: NEGATIVE
Influenza B by PCR: NEGATIVE
SARS Coronavirus 2 by RT PCR: NEGATIVE

## 2021-09-21 LAB — SURGICAL PCR SCREEN
MRSA, PCR: NEGATIVE
Staphylococcus aureus: NEGATIVE

## 2021-09-21 MED ORDER — METRONIDAZOLE 500 MG PO TABS
500.0000 mg | ORAL_TABLET | ORAL | Status: DC
  Administered 2021-09-21 – 2021-09-22 (×2): 500 mg via ORAL
  Filled 2021-09-21 (×2): qty 1

## 2021-09-21 MED ORDER — NEOMYCIN SULFATE 500 MG PO TABS: 500.0000 mg | ORAL_TABLET | ORAL | Status: DC

## 2021-09-21 MED ORDER — ATORVASTATIN CALCIUM 10 MG PO TABS
10.0000 mg | ORAL_TABLET | Freq: Every day | ORAL | Status: DC
  Administered 2021-09-23 – 2021-09-28 (×6): 10 mg via ORAL
  Filled 2021-09-21 (×6): qty 1

## 2021-09-21 MED ORDER — SERTRALINE HCL 50 MG PO TABS
50.0000 mg | ORAL_TABLET | Freq: Every day | ORAL | Status: DC
  Administered 2021-09-23 – 2021-09-28 (×6): 50 mg via ORAL
  Filled 2021-09-21 (×6): qty 1

## 2021-09-21 MED ORDER — ALVIMOPAN 12 MG PO CAPS
12.0000 mg | ORAL_CAPSULE | ORAL | Status: AC
  Administered 2021-09-22: 12 mg via ORAL
  Filled 2021-09-21: qty 1

## 2021-09-21 MED ORDER — PEG 3350-KCL-NA BICARB-NACL 420 G PO SOLR
4000.0000 mL | Freq: Once | ORAL | Status: AC
  Administered 2021-09-21: 4000 mL via ORAL

## 2021-09-21 MED ORDER — PIPERACILLIN-TAZOBACTAM 3.375 G IVPB 30 MIN
3.3750 g | INTRAVENOUS | Status: AC
Start: 2021-09-22 — End: 2021-09-22
  Administered 2021-09-22: 3.375 g via INTRAVENOUS
  Filled 2021-09-21 (×2): qty 50

## 2021-09-21 MED ORDER — SODIUM CHLORIDE 0.9 % IV SOLN: INTRAVENOUS | Status: DC

## 2021-09-21 NOTE — Anesthesia Preprocedure Evaluation (Addendum)
Anesthesia Evaluation  Patient identified by MRN, date of birth, ID band Patient awake    Reviewed: Allergy & Precautions, NPO status , Patient's Chart, lab work & pertinent test results  Airway Mallampati: II  TM Distance: >3 FB Neck ROM: Full    Dental no notable dental hx. (+) Teeth Intact, Dental Advisory Given   Pulmonary former smoker,    Pulmonary exam normal breath sounds clear to auscultation       Cardiovascular Exercise Tolerance: Good Normal cardiovascular exam Rhythm:Regular Rate:Normal     Neuro/Psych negative neurological ROS  negative psych ROS   GI/Hepatic negative GI ROS, Neg liver ROS,   Endo/Other    Renal/GU Lab Results      Component                Value               Date                      CREATININE               1.24                09/21/2021                K                        3.9                 09/21/2021                    Bladder dysfunction (Hx of Bladder CA)      Musculoskeletal negative musculoskeletal ROS (+)   Abdominal   Peds  Hematology Lab Results      Component                Value               Date                            HGB                      15.6                09/21/2021                HCT                      48.5                09/21/2021                        PLT                      186                 09/21/2021             Anesthesia Other Findings NKA  Reproductive/Obstetrics                            Anesthesia Physical Anesthesia Plan  ASA: 2  Anesthesia Plan: General   Post-op Pain Management: Lidocaine infusion and Ketamine IV   Induction:  Intravenous  PONV Risk Score and Plan: 3 and Treatment may vary due to age or medical condition and Ondansetron  Airway Management Planned: Oral ETT  Additional Equipment: None  Intra-op Plan:   Post-operative Plan: Extubation in OR  Informed Consent: I have reviewed  the patients History and Physical, chart, labs and discussed the procedure including the risks, benefits and alternatives for the proposed anesthesia with the patient or authorized representative who has indicated his/her understanding and acceptance.     Dental advisory given  Plan Discussed with: CRNA and Anesthesiologist  Anesthesia Plan Comments: (2 (18g) IVS)       Anesthesia Quick Evaluation

## 2021-09-21 NOTE — H&P (Signed)
Benjamin Hensley is an 81 y.o. male.    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 Staging CXR and CT localized. Cr 1.2's. Single ureters bilaterally. 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   2 Umbilical Hernia - fat-containing umbilical hernia incidental on CT 2022, no h/o strangulation   PMH sig for Rt inguinal hernia repair, TNA, pilonidal cyst surgery. 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 His wife May is very involved as well. His PCP is Lavone Orn MD.   Today " Benjamin Hensley" is seen as pre-op admission before major bladder cancer surgery with cystoprostatectomy, node dissection, and conduit diversion. No interval fevers.   Past Medical History:  Diagnosis Date   Medical history non-contributory    Meningitis    hx of as a child affected right eye    Past Surgical History:  Procedure Laterality Date   ciliodional cyst  yrs ago   COLONOSCOPY WITH PROPOFOL N/A 02/26/2017   Procedure: COLONOSCOPY WITH PROPOFOL;  Surgeon: Garlan Fair, MD;  Location: [facility] ENDOSCOPY;  Service: Endoscopy;  Laterality: N/A;   colonscopy  2008   HERNIA REPAIR     TONSILLECTOMY  as child   TRANSURETHRAL RESECTION OF BLADDER TUMOR WITH MITOMYCIN-C N/A 05/02/2021   Procedure: TRANSURETHRAL RESECTION OF BLADDER TUMOR WITH GEMCITABINE;  Surgeon: Irine Seal, MD;  Location: [facility] ORS;  Service: Urology;  Laterality: N/A;  GENERAL ANESTHESIA WITH PARALYSIS    No family history on file. Social History:  reports that he has quit smoking. His smoking use included cigarettes. He has a 30.00 pack-year smoking history. He has never used smokeless tobacco. He reports current alcohol use. He reports that he does not use drugs.  Allergies: No Known Allergies  Medications Prior to Admission  Medication Sig Dispense Refill    acetaminophen (TYLENOL) 500 MG tablet Take 1,000 mg by mouth every 6 (six) hours as needed for moderate pain or headache.     atorvastatin (LIPITOR) 10 MG tablet Take 10 mg by mouth daily.     Multiple Vitamins-Minerals (EMERGEN-C VITAMIN C PO) Take 1 Package by mouth daily.     sertraline (ZOLOFT) 50 MG tablet Take 50 mg by mouth daily.  3   HYDROcodone-acetaminophen (NORCO/VICODIN) 5-325 MG tablet Take 1 tablet by mouth every 6 (six) hours as needed for moderate pain. (Patient not taking: Reported on 09/12/2021) 8 tablet 0    No results found for this or any previous visit (from the past 48 hour(s)). No results found.  Review of Systems  Constitutional:  Negative for chills and fever.  All other systems reviewed and are negative.  There were no vitals taken for this visit. Physical Exam Vitals reviewed.  HENT:     Head: Normocephalic.  Eyes:     Pupils: Pupils are equal, round, and reactive to light.  Cardiovascular:     Rate and Rhythm: Normal rate.     Pulses: Normal pulses.  Abdominal:     General: Abdomen is flat.     Comments: Reducible umbilical hernia.   Genitourinary:    Comments: No CVAT at present Musculoskeletal:        General: Normal range of motion.     Cervical back: Normal range of motion.  Skin:    General: Skin is warm.  Neurological:     General: No focal deficit present.  Mental Status: He is alert.  Psychiatric:        Mood and Affect: Mood normal.     Assessment/Plan  Bowel prep, stomal marking, CBC, CMP, [place], NPO p MN, 1/2 MIVF. Discussed risks, benefits, alternatives, expected peri-op course with cystectomy including about 5.5 hour surgery, 7 day hospital stay, home with home health and 3-26mos full recovery.    [name], MD 09/21/2021, 1:48 PM

## 2021-09-21 NOTE — Consult Note (Signed)
[facility] Nurse requested for preoperative stoma site marking  Discussed surgical procedure and stoma creation with patient and family.  Explained role of the [place] nurse team.  Provided the patient with educational booklet and provided samples of pouching options.  Answered patient and family questions.   Examined patient sitting, and standing in order to place the marking in the patient's visual field, away from any creases or abdominal contour issues and within the rectus muscle.  Attempted to mark below the patient's belt line, unable to due to a crease just above the umbilicus  Marked for ileal conduit in the RUQ __4__  cm to the right of the umbilicus and  __4__ cm above the umbilicus.  Patient to OR tomorrow therefore mark was not covered.   [facility] Nurse team will follow up with patient after surgery for continued ostomy care and teaching.  Cathlean Marseilles Tamala Julian, MSN, RN, [place], Lysle Pearl, [facility] Wound Treatment Associate Pager [phone]

## 2021-09-22 ENCOUNTER — Inpatient Hospital Stay (HOSPITAL_COMMUNITY): Payer: Medicare Other | Admitting: Anesthesiology

## 2021-09-22 ENCOUNTER — Encounter (HOSPITAL_COMMUNITY)
Admission: RE | Disposition: A | Discharge status: Home Health | Payer: Self-pay | Source: Ambulatory Visit | Attending: Urology

## 2021-09-22 ENCOUNTER — Encounter (HOSPITAL_COMMUNITY): Payer: Self-pay | Admitting: Urology

## 2021-09-22 DIAGNOSIS — C679 Malignant neoplasm of bladder, unspecified: Secondary | ICD-10-CM | POA: Diagnosis present

## 2021-09-22 HISTORY — PX: LYMPH NODE DISSECTION: SHX5087

## 2021-09-22 HISTORY — PX: ROBOT ASSISTED LAPAROSCOPIC COMPLETE CYSTECT ILEAL CONDUIT: SHX5139

## 2021-09-22 HISTORY — PX: UMBILICAL HERNIA REPAIR: SHX196

## 2021-09-22 HISTORY — PX: ROBOT ASSISTED LAPAROSCOPIC RADICAL PROSTATECTOMY: SHX5141

## 2021-09-22 LAB — GLUCOSE, CAPILLARY: Glucose-Capillary: 157 mg/dL — ABNORMAL HIGH (ref 70–99)

## 2021-09-22 LAB — HEMOGLOBIN AND HEMATOCRIT, BLOOD
HCT: 46.6 % (ref 39.0–52.0)
Hemoglobin: 15 g/dL (ref 13.0–17.0)

## 2021-09-22 SURGERY — CYSTECTOMY, ROBOT-ASSISTED, WITH ILEAL CONDUIT CREATION
Anesthesia: General | Site: Abdomen

## 2021-09-22 MED ORDER — LIDOCAINE HCL (PF) 2 % IJ SOLN
INTRAMUSCULAR | Status: DC | PRN
  Administered 2021-09-22: 1.5 mg/kg/h

## 2021-09-22 MED ORDER — DEXAMETHASONE SODIUM PHOSPHATE 10 MG/ML IJ SOLN
INTRAMUSCULAR | Status: AC
  Filled 2021-09-22: qty 1

## 2021-09-22 MED ORDER — BUPIVACAINE LIPOSOME 1.3 % IJ SUSP
INTRAMUSCULAR | Status: AC
  Filled 2021-09-22: qty 20

## 2021-09-22 MED ORDER — ONDANSETRON HCL 4 MG/2ML IJ SOLN
INTRAMUSCULAR | Status: AC
  Filled 2021-09-22: qty 2

## 2021-09-22 MED ORDER — ROCURONIUM BROMIDE 10 MG/ML (PF) SYRINGE
PREFILLED_SYRINGE | INTRAVENOUS | Status: DC | PRN
  Administered 2021-09-22: 30 mg via INTRAVENOUS
  Administered 2021-09-22: 20 mg via INTRAVENOUS
  Administered 2021-09-22: 30 mg via INTRAVENOUS
  Administered 2021-09-22: 60 mg via INTRAVENOUS

## 2021-09-22 MED ORDER — PHENYLEPHRINE HCL-NACL 20-0.9 MG/250ML-% IV SOLN
INTRAVENOUS | Status: DC | PRN
  Administered 2021-09-22: 25 ug/min via INTRAVENOUS

## 2021-09-22 MED ORDER — ROCURONIUM BROMIDE 10 MG/ML (PF) SYRINGE
PREFILLED_SYRINGE | INTRAVENOUS | Status: AC
  Filled 2021-09-22: qty 10

## 2021-09-22 MED ORDER — STERILE WATER FOR IRRIGATION IR SOLN
Status: DC | PRN
  Administered 2021-09-22: 3000 mL

## 2021-09-22 MED ORDER — PROPOFOL 500 MG/50ML IV EMUL
INTRAVENOUS | Status: AC
  Filled 2021-09-22: qty 50

## 2021-09-22 MED ORDER — PIPERACILLIN-TAZOBACTAM 3.375 G IVPB
3.3750 g | Freq: Three times a day (TID) | INTRAVENOUS | Status: AC
  Administered 2021-09-22 – 2021-09-23 (×3): 3.375 g via INTRAVENOUS
  Filled 2021-09-22 (×3): qty 50

## 2021-09-22 MED ORDER — ALVIMOPAN 12 MG PO CAPS
12.0000 mg | ORAL_CAPSULE | Freq: Two times a day (BID) | ORAL | Status: DC
  Administered 2021-09-23 – 2021-09-24 (×4): 12 mg via ORAL
  Filled 2021-09-22 (×4): qty 1

## 2021-09-22 MED ORDER — SODIUM CHLORIDE (PF) 0.9 % IJ SOLN
INTRAMUSCULAR | Status: DC | PRN
  Administered 2021-09-22: 20 mL

## 2021-09-22 MED ORDER — FENTANYL CITRATE (PF) 100 MCG/2ML IJ SOLN
INTRAMUSCULAR | Status: AC
  Filled 2021-09-22: qty 2

## 2021-09-22 MED ORDER — KETAMINE HCL 50 MG/5ML IJ SOSY
PREFILLED_SYRINGE | INTRAMUSCULAR | Status: AC
  Filled 2021-09-22: qty 5

## 2021-09-22 MED ORDER — FENTANYL CITRATE PF 50 MCG/ML IJ SOSY
PREFILLED_SYRINGE | INTRAMUSCULAR | Status: AC
  Filled 2021-09-22: qty 3

## 2021-09-22 MED ORDER — DIPHENHYDRAMINE HCL 12.5 MG/5ML PO ELIX
12.5000 mg | ORAL_SOLUTION | Freq: Four times a day (QID) | ORAL | Status: DC | PRN
  Administered 2021-09-27: 12.5 mg via ORAL
  Filled 2021-09-22: qty 5

## 2021-09-22 MED ORDER — ACETAMINOPHEN 10 MG/ML IV SOLN: 1000.0000 mg | Freq: Once | INTRAVENOUS | Status: DC | PRN

## 2021-09-22 MED ORDER — LIDOCAINE HCL 2 % IJ SOLN
INTRAMUSCULAR | Status: AC
  Filled 2021-09-22: qty 40

## 2021-09-22 MED ORDER — LACTATED RINGERS IV SOLN: INTRAVENOUS | Status: DC

## 2021-09-22 MED ORDER — OXYCODONE HCL 5 MG PO TABS
5.0000 mg | ORAL_TABLET | ORAL | Status: DC | PRN
  Administered 2021-09-22 – 2021-09-25 (×5): 5 mg via ORAL
  Filled 2021-09-22 (×5): qty 1

## 2021-09-22 MED ORDER — LIDOCAINE 2% (20 MG/ML) 5 ML SYRINGE
INTRAMUSCULAR | Status: DC | PRN
  Administered 2021-09-22: 100 mg via INTRAVENOUS

## 2021-09-22 MED ORDER — PHENYLEPHRINE 40 MCG/ML (10ML) SYRINGE FOR IV PUSH (FOR BLOOD PRESSURE SUPPORT)
PREFILLED_SYRINGE | INTRAVENOUS | Status: AC
  Filled 2021-09-22: qty 10

## 2021-09-22 MED ORDER — FENTANYL CITRATE PF 50 MCG/ML IJ SOSY
25.0000 ug | PREFILLED_SYRINGE | INTRAMUSCULAR | Status: DC | PRN
  Administered 2021-09-22: 50 ug via INTRAVENOUS

## 2021-09-22 MED ORDER — LIDOCAINE HCL (PF) 2 % IJ SOLN
INTRAMUSCULAR | Status: AC
  Filled 2021-09-22: qty 5

## 2021-09-22 MED ORDER — DEXTROSE-NACL 5-0.45 % IV SOLN
INTRAVENOUS | Status: DC
  Administered 2021-09-22: 1000 mL via INTRAVENOUS

## 2021-09-22 MED ORDER — DIPHENHYDRAMINE HCL 50 MG/ML IJ SOLN: 12.5000 mg | Freq: Four times a day (QID) | INTRAMUSCULAR | Status: DC | PRN

## 2021-09-22 MED ORDER — SUGAMMADEX SODIUM 200 MG/2ML IV SOLN
INTRAVENOUS | Status: DC | PRN
  Administered 2021-09-22: 200 mg via INTRAVENOUS

## 2021-09-22 MED ORDER — ONDANSETRON HCL 4 MG/2ML IJ SOLN: 4.0000 mg | INTRAMUSCULAR | Status: DC | PRN

## 2021-09-22 MED ORDER — SENNOSIDES-DOCUSATE SODIUM 8.6-50 MG PO TABS
2.0000 | ORAL_TABLET | Freq: Every day | ORAL | Status: DC
  Administered 2021-09-22 – 2021-09-23 (×2): 2 via ORAL
  Filled 2021-09-22 (×3): qty 2

## 2021-09-22 MED ORDER — HYDROMORPHONE HCL 1 MG/ML IJ SOLN
0.5000 mg | INTRAMUSCULAR | Status: DC | PRN
  Administered 2021-09-22: 1 mg via INTRAVENOUS
  Filled 2021-09-22: qty 1

## 2021-09-22 MED ORDER — PROPOFOL 10 MG/ML IV BOLUS
INTRAVENOUS | Status: DC | PRN
Start: 2021-09-22 — End: 2021-09-22
  Administered 2021-09-22: 150 mg via INTRAVENOUS

## 2021-09-22 MED ORDER — FENTANYL CITRATE (PF) 250 MCG/5ML IJ SOLN
INTRAMUSCULAR | Status: AC
  Filled 2021-09-22: qty 5

## 2021-09-22 MED ORDER — LACTATED RINGERS IR SOLN
Status: DC | PRN
  Administered 2021-09-22: 1000 mL

## 2021-09-22 MED ORDER — PHENYLEPHRINE HCL (PRESSORS) 10 MG/ML IV SOLN
INTRAVENOUS | Status: AC
  Filled 2021-09-22: qty 1

## 2021-09-22 MED ORDER — BUPIVACAINE LIPOSOME 1.3 % IJ SUSP
INTRAMUSCULAR | Status: DC | PRN
  Administered 2021-09-22: 20 mL

## 2021-09-22 MED ORDER — PHENYLEPHRINE 40 MCG/ML (10ML) SYRINGE FOR IV PUSH (FOR BLOOD PRESSURE SUPPORT)
PREFILLED_SYRINGE | INTRAVENOUS | Status: DC | PRN
  Administered 2021-09-22 (×4): 80 ug via INTRAVENOUS

## 2021-09-22 MED ORDER — SODIUM CHLORIDE (PF) 0.9 % IJ SOLN
INTRAMUSCULAR | Status: AC
  Filled 2021-09-22: qty 20

## 2021-09-22 MED ORDER — KETAMINE HCL-SODIUM CHLORIDE 100-0.9 MG/10ML-% IV SOSY
PREFILLED_SYRINGE | INTRAVENOUS | Status: DC | PRN
  Administered 2021-09-22 (×2): 10 mg via INTRAVENOUS
  Administered 2021-09-22: 30 mg via INTRAVENOUS

## 2021-09-22 MED ORDER — ONDANSETRON HCL 4 MG/2ML IJ SOLN
INTRAMUSCULAR | Status: DC | PRN
  Administered 2021-09-22: 4 mg via INTRAVENOUS

## 2021-09-22 MED ORDER — LACTATED RINGERS IV SOLN
INTRAVENOUS | Status: DC | PRN
Start: 2021-09-22 — End: 2021-09-22

## 2021-09-22 MED ORDER — DEXAMETHASONE SODIUM PHOSPHATE 10 MG/ML IJ SOLN
INTRAMUSCULAR | Status: DC | PRN
  Administered 2021-09-22: 10 mg via INTRAVENOUS

## 2021-09-22 MED ORDER — ONDANSETRON HCL 4 MG/2ML IJ SOLN: 4.0000 mg | Freq: Once | INTRAMUSCULAR | Status: DC | PRN

## 2021-09-22 MED ORDER — ACETAMINOPHEN 10 MG/ML IV SOLN
1000.0000 mg | Freq: Four times a day (QID) | INTRAVENOUS | Status: AC
  Administered 2021-09-22 – 2021-09-23 (×4): 1000 mg via INTRAVENOUS
  Filled 2021-09-22 (×4): qty 100

## 2021-09-22 MED ORDER — FENTANYL CITRATE (PF) 100 MCG/2ML IJ SOLN
INTRAMUSCULAR | Status: DC | PRN
  Administered 2021-09-22: 50 ug via INTRAVENOUS
  Administered 2021-09-22 (×2): 25 ug via INTRAVENOUS
  Administered 2021-09-22 (×5): 50 ug via INTRAVENOUS

## 2021-09-22 MED ORDER — STERILE WATER FOR IRRIGATION IR SOLN
Status: DC | PRN
  Administered 2021-09-22: 1000 mL

## 2021-09-22 MED ORDER — PHENYLEPHRINE HCL-NACL 20-0.9 MG/250ML-% IV SOLN
INTRAVENOUS | Status: AC
  Filled 2021-09-22: qty 250

## 2021-09-22 SURGICAL SUPPLY — 122 items
ADH SKN CLS APL DERMABOND .7 (GAUZE/BANDAGES/DRESSINGS) ×3
AGENT HMST KT MTR STRL THRMB (HEMOSTASIS)
APL ESCP 34 STRL LF DISP (HEMOSTASIS)
APL PRP STRL LF DISP 70% ISPRP (MISCELLANEOUS) ×3
APL SKNCLS STERI-STRIP NONHPOA (GAUZE/BANDAGES/DRESSINGS)
APL SWBSTK 6 STRL LF DISP (MISCELLANEOUS) ×3
APPLICATOR COTTON TIP 6 STRL (MISCELLANEOUS) ×4 IMPLANT
APPLICATOR COTTON TIP 6IN STRL (MISCELLANEOUS) ×4
APPLICATOR SURGIFLO ENDO (HEMOSTASIS) IMPLANT
BAG COUNTER SPONGE SURGICOUNT (BAG) IMPLANT
BAG LAPAROSCOPIC 12 15 PORT 16 (BASKET) ×4 IMPLANT
BAG RETRIEVAL 12/15 (BASKET) ×4
BAG SPNG CNTER NS LX DISP (BAG)
BENZOIN TINCTURE PRP APPL 2/3 (GAUZE/BANDAGES/DRESSINGS) IMPLANT
BLADE SURG SZ10 CARB STEEL (BLADE) IMPLANT
CATH FOLEY 2WAY SLVR 18FR 30CC (CATHETERS) ×5 IMPLANT
CATH SILICONE 5CC 18FR (INSTRUMENTS) ×5 IMPLANT
CATH TIEMANN FOLEY 18FR 5CC (CATHETERS) ×5 IMPLANT
CELLS DAT CNTRL 66122 CELL SVR (MISCELLANEOUS) ×3 IMPLANT
CHLORAPREP W/TINT 26 (MISCELLANEOUS) ×5 IMPLANT
CLIP LIGATING HEM O LOK PURPLE (MISCELLANEOUS) ×21 IMPLANT
CLIP LIGATING HEMO LOK XL GOLD (MISCELLANEOUS) ×10 IMPLANT
CLIP LIGATING HEMO O LOK GREEN (MISCELLANEOUS) ×5 IMPLANT
CNTNR URN SCR LID CUP LEK RST (MISCELLANEOUS) ×4 IMPLANT
CONT SPEC 4OZ STRL OR WHT (MISCELLANEOUS) ×4
COVER SURGICAL LIGHT HANDLE (MISCELLANEOUS) ×5 IMPLANT
COVER TIP SHEARS 8 DVNC (MISCELLANEOUS) ×4 IMPLANT
COVER TIP SHEARS 8MM DA VINCI (MISCELLANEOUS) ×4
CUTTER ECHEON FLEX ENDO 45 340 (ENDOMECHANICALS) ×5 IMPLANT
DECANTER SPIKE VIAL GLASS SM (MISCELLANEOUS) ×5 IMPLANT
DERMABOND ADVANCED (GAUZE/BANDAGES/DRESSINGS) ×1
DERMABOND ADVANCED .7 DNX12 (GAUZE/BANDAGES/DRESSINGS) ×8 IMPLANT
DRAIN CHANNEL RND F F (WOUND CARE) IMPLANT
DRAIN PENROSE 0.5X18 (DRAIN) IMPLANT
DRAPE ARM DVNC X/XI (DISPOSABLE) ×16 IMPLANT
DRAPE COLUMN DVNC XI (DISPOSABLE) ×4 IMPLANT
DRAPE DA VINCI XI ARM (DISPOSABLE) ×16
DRAPE DA VINCI XI COLUMN (DISPOSABLE) ×4
DRAPE SURG IRRIG POUCH 19X23 (DRAPES) ×5 IMPLANT
DRSG TEGADERM 4X4.75 (GAUZE/BANDAGES/DRESSINGS) ×5 IMPLANT
ELECT PENCIL ROCKER SW 15FT (MISCELLANEOUS) ×5 IMPLANT
ELECT REM PT RETURN 15FT ADLT (MISCELLANEOUS) ×5 IMPLANT
GAUZE 4X4 16PLY ~~LOC~~+RFID DBL (SPONGE) IMPLANT
GAUZE SPONGE 2X2 8PLY STRL LF (GAUZE/BANDAGES/DRESSINGS) IMPLANT
GLOVE SURG ENC MOIS LTX SZ6.5 (GLOVE) ×10 IMPLANT
GLOVE SURG ENC TEXT LTX SZ7.5 (GLOVE) ×15 IMPLANT
GLOVE SURG UNDER POLY LF SZ7.5 (GLOVE) ×5 IMPLANT
GOWN STRL REUS W/TWL LRG LVL3 (GOWN DISPOSABLE) ×25 IMPLANT
HOLDER FOLEY CATH W/STRAP (MISCELLANEOUS) ×5 IMPLANT
IRRIG SUCT STRYKERFLOW 2 WTIP (MISCELLANEOUS) ×4
IRRIGATION SUCT STRKRFLW 2 WTP (MISCELLANEOUS) ×4 IMPLANT
IV LACTATED RINGERS 1000ML (IV SOLUTION) ×5 IMPLANT
KIT PROCEDURE DA VINCI SI (MISCELLANEOUS) ×4
KIT PROCEDURE DVNC SI (MISCELLANEOUS) ×4 IMPLANT
KIT TURNOVER KIT A (KITS) IMPLANT
LOOP VESSEL MAXI BLUE (MISCELLANEOUS) ×5 IMPLANT
NDL ASPIRATION 22 (NEEDLE) ×4 IMPLANT
NDL INSUFFLATION 14GA 120MM (NEEDLE) ×4 IMPLANT
NDL SPNL 22GX7 QUINCKE BK (NEEDLE) ×4 IMPLANT
NEEDLE ASPIRATION 22 (NEEDLE) ×4 IMPLANT
NEEDLE INSUFFLATION 14GA 120MM (NEEDLE) ×4 IMPLANT
NEEDLE SPNL 22GX7 QUINCKE BK (NEEDLE) ×4 IMPLANT
PACK ROBOT UROLOGY CUSTOM (CUSTOM PROCEDURE TRAY) ×5 IMPLANT
PAD POSITIONING PINK XL (MISCELLANEOUS) ×5 IMPLANT
PORT ACCESS TROCAR AIRSEAL 12 (TROCAR) ×4 IMPLANT
PORT ACCESS TROCAR AIRSEAL 5M (TROCAR) ×1
RELOAD STAPLE 45 4.1 GRN THCK (STAPLE) ×4 IMPLANT
RELOAD STAPLE 60 2.6 WHT THN (STAPLE) ×12 IMPLANT
RELOAD STAPLE 60 4.1 GRN THCK (STAPLE) ×12 IMPLANT
RELOAD STAPLER GREEN 60MM (STAPLE) ×21 IMPLANT
RELOAD STAPLER WHITE 60MM (STAPLE) ×15 IMPLANT
RETRACTOR LONRSTAR 16.6X16.6CM (MISCELLANEOUS) IMPLANT
RETRACTOR STAY HOOK 5MM (MISCELLANEOUS) IMPLANT
RETRACTOR STER APS 16.6X16.6CM (MISCELLANEOUS)
RETRACTOR WND ALEXIS 18 MED (MISCELLANEOUS) ×4 IMPLANT
RTRCTR WOUND ALEXIS 18CM MED (MISCELLANEOUS) ×4
SEAL CANN UNIV 5-8 DVNC XI (MISCELLANEOUS) ×16 IMPLANT
SEAL XI 5MM-8MM UNIVERSAL (MISCELLANEOUS) ×16
SET TRI-LUMEN FLTR TB AIRSEAL (TUBING) ×5 IMPLANT
SOLUTION ELECTROLUBE (MISCELLANEOUS) ×5 IMPLANT
SPONGE GAUZE 2X2 STER 10/PKG (GAUZE/BANDAGES/DRESSINGS)
SPONGE T-LAP 18X18 ~~LOC~~+RFID (SPONGE) ×10 IMPLANT
SPONGE T-LAP 4X18 ~~LOC~~+RFID (SPONGE) ×5 IMPLANT
STAPLE RELOAD 45 GRN (STAPLE) ×3 IMPLANT
STAPLE RELOAD 45MM GREEN (STAPLE) ×4
STAPLER ECHELON LONG 60 440 (INSTRUMENTS) ×5 IMPLANT
STAPLER RELOAD GREEN 60MM (STAPLE) ×28
STAPLER RELOAD WHITE 60MM (STAPLE) ×20
STENT SET URETHERAL LEFT 7FR (STENTS) ×5 IMPLANT
STENT SET URETHERAL RIGHT 7FR (STENTS) ×5 IMPLANT
SURGIFLO W/THROMBIN 8M KIT (HEMOSTASIS) IMPLANT
SUT CHROMIC 4 0 RB 1X27 (SUTURE) ×5 IMPLANT
SUT ETHILON 3 0 PS 1 (SUTURE) ×5 IMPLANT
SUT MNCRL AB 4-0 PS2 18 (SUTURE) ×10 IMPLANT
SUT NOVA NAB GS-21 0 18 T12 DT (SUTURE) ×1 IMPLANT
SUT PDS AB 0 CTX 36 PDP370T (SUTURE) ×15 IMPLANT
SUT PDS AB 1 CT1 27 (SUTURE) ×10 IMPLANT
SUT SILK 3 0 SH 30 (SUTURE) IMPLANT
SUT SILK 3 0 SH CR/8 (SUTURE) ×5 IMPLANT
SUT V-LOC BARB 180 2/0GR6 GS22 (SUTURE)
SUT VIC AB 2-0 CT1 27 (SUTURE)
SUT VIC AB 2-0 CT1 27XBRD (SUTURE) IMPLANT
SUT VIC AB 2-0 SH 18 (SUTURE) IMPLANT
SUT VIC AB 2-0 SH 27 (SUTURE) ×4
SUT VIC AB 2-0 SH 27X BRD (SUTURE) ×4 IMPLANT
SUT VIC AB 2-0 UR5 27 (SUTURE) ×20 IMPLANT
SUT VIC AB 3-0 SH 27 (SUTURE) ×24
SUT VIC AB 3-0 SH 27X BRD (SUTURE) ×8 IMPLANT
SUT VIC AB 3-0 SH 27XBRD (SUTURE) ×16 IMPLANT
SUT VIC AB 4-0 RB1 27 (SUTURE) ×16
SUT VIC AB 4-0 RB1 27XBRD (SUTURE) ×16 IMPLANT
SUT VICRYL 0 UR6 27IN ABS (SUTURE) ×5 IMPLANT
SUT VLOC BARB 180 ABS3/0GR12 (SUTURE) ×12
SUTURE V-LC BRB 180 2/0GR6GS22 (SUTURE) IMPLANT
SUTURE VLOC BRB 180 ABS3/0GR12 (SUTURE) ×12 IMPLANT
SYR 27GX1/2 1ML LL SAFETY (SYRINGE) ×5 IMPLANT
SYSTEM UROSTOMY GENTLE TOUCH (WOUND CARE) ×5 IMPLANT
TOWEL OR NON WOVEN STRL DISP B (DISPOSABLE) ×5 IMPLANT
TROCAR BLADELESS 15MM (ENDOMECHANICALS) ×5 IMPLANT
TROCAR XCEL NON-BLD 5MMX100MML (ENDOMECHANICALS) IMPLANT
TUBING CONNECTING 10 (TUBING) ×5 IMPLANT
WATER STERILE IRR 1000ML POUR (IV SOLUTION) ×5 IMPLANT

## 2021-09-22 NOTE — Transfer of Care (Signed)
Immediate Anesthesia Transfer of Care Note  Patient: Benjamin Hensley  Procedure(s) Performed: XI ROBOTIC ASSISTED LAPAROSCOPIC COMPLETE CYSTECT ILEAL CONDUIT WITH INDOCYANINE GREEN DYE (Abdomen) XI ROBOTIC ASSISTED LAPAROSCOPIC RADICAL PROSTATECTOMY (Abdomen) LYMPH NODE DISSECTION (Bilateral: Abdomen) OPEN HERNIA REPAIR UMBILICAL ADULT INDOCYANINE GREEN FLUORESCENCE IMAGING (ICG) (Abdomen)  Patient Location: PACU  Anesthesia Type:General  Level of Consciousness: awake, alert  and oriented  Airway & Oxygen Therapy: Patient Spontanous Breathing and Patient connected to face mask oxygen  Post-op Assessment: Report given to RN and Post -op Vital signs reviewed and stable  Post vital signs: Reviewed and stable  Last Vitals:  Vitals Value Taken Time  BP 114/78   Temp    Pulse 72 09/22/21 1236  Resp 15 09/22/21 1236  SpO2 99 % 09/22/21 1236  Vitals shown include unvalidated device data.  Last Pain:  Vitals:   09/22/21 0412  TempSrc: Oral  PainSc:          Complications: No notable events documented.

## 2021-09-22 NOTE — Progress Notes (Signed)
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 Staging CXR and CT localized. Cr 1.2's. Single ureters bilaterally. 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   2 Umbilical Hernia - fat-containing umbilical hernia incidental on CT 2022, no h/o strangulation   Today " Benjamin Hensley" is seen to proceed with cystectomy / umbilical hernia repair. He complete bowel prep to clear. Hgb 15.     Objective: Vital signs in last 24 hours: Temp:  [97.3 F (36.3 C)-99 F (37.2 C)] 98.1 F (36.7 C) (01/13 0412) Pulse Rate:  [56-70] 70 (01/13 0412) Resp:  [16-17] 16 (01/13 0412) BP: (129-147)/(74-93) 134/89 (01/13 0412) SpO2:  [92 %-100 %] 96 % (01/13 0412) Weight:  [86.8 kg] 86.8 kg (01/12 1411) Last BM Date: 09/21/21  Intake/Output from previous day: 01/12 0701 - 01/13 0700 In: 790.3 [P.O.:360; I.V.:430.3] Out: -  Intake/Output this shift: No intake/output data recorded.  Physical Exam Vitals reviewed.  HENT:     Head: Normocephalic.  Eyes:     Pupils: Pupils are equal, round, and reactive to light. Stable disconjugate gaze.  Cardiovascular:     Rate and Rhythm: Normal rate.     Pulses: Normal pulses.  Abdominal:     General: Abdomen is flat.     Comments: Reducible umbilical hernia.  Left sided stomal site noted.  Genitourinary:    Comments: No CVAT at present Musculoskeletal:        General: Normal range of motion.     Cervical back: Normal range of motion.  Skin:    General: Skin is warm.  Neurological:     General: No focal deficit present.     Mental Status: He is alert.  Psychiatric:        Mood and Affect: Mood normal.     Lab Results:  Recent Labs    09/21/21 1357  WBC 6.6  HGB 15.6  HCT 48.5  PLT 186   BMET Recent Labs    09/21/21 1357  NA 136  K 3.9  CL 104  CO2 25  GLUCOSE 88  BUN 21  CREATININE 1.24  CALCIUM 9.1    PT/INR No results for input(s): LABPROT, INR in the last 72 hours. ABG No results for input(s): PHART, HCO3 in the last 72 hours.  Invalid input(s): PCO2, PO2  Studies/Results: No results found.  Anti-infectives: Anti-infectives (From admission, onward)    Start     Dose/Rate Route Frequency Ordered Stop   09/22/21 0600  piperacillin-tazobactam (ZOSYN) IVPB 3.375 g        3.375 g 100 mL/hr over 30 Minutes Intravenous 30 min pre-op 09/21/21 1340     09/21/21 1600  metroNIDAZOLE (FLAGYL) tablet 500 mg        500 mg Oral Every 4 hours 09/21/21 1446 09/22/21 0359   09/21/21 1600  neomycin (MYCIFRADIN) tablet 500 mg  Status:  Discontinued        500 mg Oral Every 4 hours 09/21/21 1446 09/21/21 1447       Assessment/Plan:  Proceed as planned with cystoprostatectomy with conduit diversion and umbilical hernia repair. Risks, benefits, alternatives, expected peri-op course discussed extensively previously and reiteratd today.    [name] 09/22/2021

## 2021-09-22 NOTE — Plan of Care (Signed)

## 2021-09-22 NOTE — Anesthesia Postprocedure Evaluation (Signed)
Anesthesia Post Note  Patient: Benjamin Hensley  Procedure(s) Performed: XI ROBOTIC ASSISTED LAPAROSCOPIC COMPLETE CYSTECT ILEAL CONDUIT WITH INDOCYANINE GREEN DYE (Abdomen) XI ROBOTIC ASSISTED LAPAROSCOPIC RADICAL PROSTATECTOMY (Abdomen) LYMPH NODE DISSECTION (Bilateral: Abdomen) OPEN HERNIA REPAIR UMBILICAL ADULT INDOCYANINE GREEN FLUORESCENCE IMAGING (ICG) (Abdomen)     Patient location during evaluation: PACU Anesthesia Type: General Level of consciousness: awake and alert Pain management: pain level controlled Vital Signs Assessment: post-procedure vital signs reviewed and stable Respiratory status: spontaneous breathing, nonlabored ventilation, respiratory function stable and patient connected to nasal cannula oxygen Cardiovascular status: blood pressure returned to baseline and stable Postop Assessment: no apparent nausea or vomiting Anesthetic complications: no   No notable events documented.  Last Vitals:  Vitals:   09/22/21 1400 09/22/21 1428  BP: 132/71 (!) 132/52  Pulse: 81 84  Resp: 14 18  Temp: 36.6 C 36.6 C  SpO2: 99% 100%    Last Pain:  Vitals:   09/22/21 1428  TempSrc: Oral  PainSc:                  Barnet Glasgow

## 2021-09-22 NOTE — Anesthesia Procedure Notes (Signed)
Procedure Name: Intubation Date/Time: 09/22/2021 7:40 AM Performed by: Genelle Bal, CRNA Pre-anesthesia Checklist: Patient identified, Emergency Drugs available, Suction available and Patient being monitored Patient Re-evaluated:Patient Re-evaluated prior to induction Oxygen Delivery Method: Circle system utilized Preoxygenation: Pre-oxygenation with 100% oxygen Induction Type: IV induction Ventilation: Mask ventilation without difficulty and Oral airway inserted - appropriate to patient size Laryngoscope Size: Sabra Heck and 2 Grade View: Grade I Tube type: Oral Tube size: 7.5 mm Number of attempts: 1 Airway Equipment and Method: Stylet and Oral airway Placement Confirmation: ETT inserted through vocal cords under direct vision, positive ETCO2 and breath sounds checked- equal and bilateral Secured at: 23 cm Tube secured with: Tape Dental Injury: Teeth and Oropharynx as per pre-operative assessment

## 2021-09-22 NOTE — Discharge Instructions (Signed)

## 2021-09-22 NOTE — Brief Op Note (Signed)
09/21/2021 - 09/22/2021  12:15 PM  PATIENT:  Benjamin Hensley  81 y.o. male  PRE-OPERATIVE DIAGNOSIS:  BLADDER CANCER, UMBILICAL HERNIA  POST-OPERATIVE DIAGNOSIS:  bladder cancer umbilical hernia  PROCEDURE:  Procedure(s) with comments: XI ROBOTIC ASSISTED LAPAROSCOPIC COMPLETE CYSTECT ILEAL CONDUIT WITH INDOCYANINE GREEN DYE (N/A) - 6 HRS XI ROBOTIC ASSISTED LAPAROSCOPIC RADICAL PROSTATECTOMY (N/A) LYMPH NODE DISSECTION (Bilateral) OPEN HERNIA REPAIR UMBILICAL ADULT (N/A) INDOCYANINE GREEN FLUORESCENCE IMAGING (ICG) (N/A)  SURGEON:  Surgeon(s) and Role:    [name], MD - Primary  PHYSICIAN ASSISTANT:   ASSISTANTS: Debbrah Alar PA   ANESTHESIA:   local and general  EBL:  200 mL   BLOOD ADMINISTERED:none  DRAINS:  1 - Rt sided Urostomy to gravity with Rt (red) and LT (blue) bander stents; 2- JP to bulb    LOCAL MEDICATIONS USED:  MARCAINE     SPECIMEN:  Source of Specimen:  1 - ureteral margins; 2 - pelvic lymph nodes; 3 - bladder + prostate en bloc  DISPOSITION OF SPECIMEN:  PATHOLOGY  COUNTS:  YES  TOURNIQUET:  * No tourniquets in log *  DICTATION: .Other Dictation: Dictation Number [id number]  PLAN OF CARE: Admit to inpatient   PATIENT DISPOSITION:  PACU - hemodynamically stable.   Delay start of Pharmacological VTE agent (>24hrs) due to surgical blood loss or risk of bleeding: yes

## 2021-09-23 ENCOUNTER — Encounter (HOSPITAL_COMMUNITY): Payer: Self-pay | Admitting: Urology

## 2021-09-23 LAB — HEMOGLOBIN AND HEMATOCRIT, BLOOD
HCT: 41.8 % (ref 39.0–52.0)
Hemoglobin: 13.6 g/dL (ref 13.0–17.0)

## 2021-09-23 LAB — BASIC METABOLIC PANEL
Anion gap: 10 (ref 5–15)
BUN: 22 mg/dL (ref 8–23)
CO2: 26 mmol/L (ref 22–32)
Calcium: 8.5 mg/dL — ABNORMAL LOW (ref 8.9–10.3)
Chloride: 104 mmol/L (ref 98–111)
Creatinine, Ser: 1.42 mg/dL — ABNORMAL HIGH (ref 0.61–1.24)
GFR, Estimated: 51 mL/min — ABNORMAL LOW (ref >60)
Glucose, Bld: 147 mg/dL — ABNORMAL HIGH (ref 70–99)
Potassium: 4.1 mmol/L (ref 3.5–5.1)
Sodium: 140 mmol/L (ref 135–145)

## 2021-09-23 LAB — GLUCOSE, CAPILLARY
Glucose-Capillary: 118 mg/dL — ABNORMAL HIGH (ref 70–99)
Glucose-Capillary: 120 mg/dL — ABNORMAL HIGH (ref 70–99)
Glucose-Capillary: 146 mg/dL — ABNORMAL HIGH (ref 70–99)
Glucose-Capillary: 188 mg/dL — ABNORMAL HIGH (ref 70–99)

## 2021-09-23 NOTE — Progress Notes (Signed)
The pt was able to ambulate the total length of the hall way with the Probation officer. He tolerated it well. Shortly after returning to his room, he became "weak  in his knees" and he felt like he was going to fall. He had to be assist back to bed with two staff members. After he returned to his bed he felt fine. I will continue to monitor.

## 2021-09-23 NOTE — Progress Notes (Signed)
1 Day Post-Op Subjective: No acute events overnight. Patient has ambulated x 2. No flatus/BM. Patient endorses some pain but overall controlled  Objective: Vital signs in last 24 hours: Temp:  [97.5 F (36.4 C)-98.4 F (36.9 C)] 98 F (36.7 C) (01/14 1200) Pulse Rate:  [61-99] 61 (01/14 1200) Resp:  [14-18] 16 (01/14 1200) BP: (107-132)/(52-81) 107/69 (01/14 1200) SpO2:  [93 %-100 %] 98 % (01/14 1200)  Intake/Output from previous day: 01/13 0701 - 01/14 0700 In: 2550 [I.V.:2500; IV Piggyback:50] Out: 2235 [Urine:1700; Drains:335; Blood:200] Intake/Output this shift: Total I/O In: -  Out: 390 [Urine:350; Drains:40]  Physical Exam:  General: Alert and oriented CV: RRR Lungs: Clear Abdomen: Soft, ND, ATTP; inc c/d/I; drain with scant s/s output. Ostomy pink/patent. Urine clear pink Ext: NT, No erythema  Lab Results: Recent Labs    09/21/21 1357 09/22/21 1318 09/23/21 0534  HGB 15.6 15.0 13.6  HCT 48.5 46.6 41.8   BMET Recent Labs    09/21/21 1357 09/23/21 0534  NA 136 140  K 3.9 4.1  CL 104 104  CO2 25 26  GLUCOSE 88 147*  BUN 21 22  CREATININE 1.24 1.42*  CALCIUM 9.1 8.5*     Studies/Results: No results found.  Assessment/Plan: 81 yo M s/p robot assisted cystectomy with ileal conduit.  POD 1, doing well --continue OOB --pain control --advance to clears tomorrow if no emesis   LOS: 2 days   Donald Pore MD 09/23/2021, 1:30 PM [facility]  Pager: [phone]

## 2021-09-23 NOTE — Progress Notes (Signed)
The pt had a second attempt at ambulating the full length of the unit and tolerated it well.

## 2021-09-24 LAB — BASIC METABOLIC PANEL
Anion gap: 6 (ref 5–15)
BUN: 19 mg/dL (ref 8–23)
CO2: 26 mmol/L (ref 22–32)
Calcium: 8.4 mg/dL — ABNORMAL LOW (ref 8.9–10.3)
Chloride: 109 mmol/L (ref 98–111)
Creatinine, Ser: 1.34 mg/dL — ABNORMAL HIGH (ref 0.61–1.24)
GFR, Estimated: 54 mL/min — ABNORMAL LOW (ref >60)
Glucose, Bld: 126 mg/dL — ABNORMAL HIGH (ref 70–99)
Potassium: 3.6 mmol/L (ref 3.5–5.1)
Sodium: 141 mmol/L (ref 135–145)

## 2021-09-24 LAB — HEMOGLOBIN AND HEMATOCRIT, BLOOD
HCT: 44.3 % (ref 39.0–52.0)
Hemoglobin: 14.3 g/dL (ref 13.0–17.0)

## 2021-09-24 NOTE — Progress Notes (Signed)
Pt ambulated this morning on hall way via walker, tolerated well.

## 2021-09-24 NOTE — Progress Notes (Addendum)
I administered the patient's ordered meds, including alvimopan, at 1000. The patient reported that he was not able to pass gas or have a bowel movement. The patient's bowel sounds were active. Patient attempted to go to the bathroom, but was unable to go. Patient's diet was changed to clear liquid diet from being NPO post-surgery. Patient was able to tolerate broth and ginger ale. I will continue to monitor for bowels to "wake up" post-surgery. I also completed a head-to-toe assessment on patient. Vitals were WNL except for BP which I attributed to our ambulation to the restroom. Upon assessment, stoma looked good (red, moist). I also hung a new bag of fluids (5% dextrose, 45% sodium chloride) at the ordered slower rate of 75 mL/hr. Will continue to monitor.

## 2021-09-24 NOTE — Progress Notes (Addendum)
The patient had a bowel movement in the bed. I assisted the patient to the restroom where he continued to have a fully liquid bowel movement over the course of about 15 minutes. Once he determined he was finished I bathed the patient, changed the patient's gown and socks, cleaned the bathroom and room, changed the linens on the patient's bed and got the patient back to bed. Patient stated that pain was still a 5/10 but refused any pain medication/interventions. Patient denies any cramping or abdominal discomfort due to urgent diarrhea. Will continue to monitor. Patient's JP drain was full so I emptied and recorded the amount drained (75 mL). Will continue to monitor drain, stoma and urinary bag. Still having hematuria post-surgery.

## 2021-09-24 NOTE — Evaluation (Signed)
Physical Therapy Evaluation Patient Details Name: Benjamin Hensley MRN: [id number] DOB: August 03, 1941 Today's Date: 09/24/2021  History of Present Illness  Pt s/p XI ROBOTIC ASSISTED LAPAROSCOPIC COMPLETE CYSTECT ILEAL CONDUIT, ROBOTIC ASSISTED LAPAROSCOPIC RADICAL PROSTATECTOMY, LYMPH NODE DISSECTIONand  OPEN HERNIA REPAIR UMBILICAL on 4/94/49 due to bladder CA.  Clinical Impression  Pt admitted with above diagnosis.  Pt currently with functional limitations due to the deficits listed below (see PT Problem List). Pt will benefit from skilled PT to increase their independence and safety with mobility to allow discharge to the venue listed below.  Pt limited today by large bouts of diarrhea earlier in the day.  Legs feel weak due to this and didn't want to walk away from his room.  Anticipate that he should progress well enough to not need any home PT or DME, but will continue to assess.        Recommendations for follow up therapy are one component of a multi-disciplinary discharge planning process, led by the attending physician.  Recommendations may be updated based on patient status, additional functional criteria and insurance authorization.  Follow Up Recommendations No PT follow up    Assistance Recommended at Discharge    Patient can return home with the following  A little help with walking and/or transfers;A little help with bathing/dressing/bathroom    Equipment Recommendations Other (comment) (continue to assess)  Recommendations for Other Services       Functional Status Assessment Patient has had a recent decline in their functional status and demonstrates the ability to make significant improvements in function in a reasonable and predictable amount of time.     Precautions / Restrictions Precautions Precautions: Fall Restrictions Weight Bearing Restrictions: No      Mobility  Bed Mobility Overal bed mobility: Needs Assistance Bed Mobility: Rolling;Sidelying to  Sit;Sit to Sidelying Rolling: Min guard Sidelying to sit: Min assist     Sit to sidelying: Min assist General bed mobility comments: A for legs with bed mobility, but pt gives forth good effort. Instructed in log rolling.    Transfers Overall transfer level: Needs assistance Equipment used: None Transfers: Sit to/from Stand Sit to Stand: Min assist           General transfer comment: MIN A to power up due to weakness    Ambulation/Gait Ambulation/Gait assistance: Min guard Gait Distance (Feet): 50 Feet (in room) Assistive device: IV Pole Gait Pattern/deviations: Step-through pattern       General Gait Details: Ambulated in room only due to pt having had diarrhea earlier.  Stairs            Wheelchair Mobility    Modified Rankin (Stroke Patients Only)       Balance Overall balance assessment: No apparent balance deficits (not formally assessed)                                           Pertinent Vitals/Pain Pain Assessment: Faces Faces Pain Scale: Hurts a little bit    Home Living Family/patient expects to be discharged to:: Private residence Living Arrangements: Spouse/significant other Available Help at Discharge: Family Type of Home: House Home Access: Stairs to enter Entrance Stairs-Rails: None Entrance Stairs-Number of Steps: 3   Home Layout: One level Home Equipment: None      Prior Function Prior Level of Function : Independent/Modified Independent  Hand Dominance        Extremity/Trunk Assessment   Upper Extremity Assessment Upper Extremity Assessment: Overall WFL for tasks assessed    Lower Extremity Assessment Lower Extremity Assessment: Overall WFL for tasks assessed;Generalized weakness       Communication      Cognition Arousal/Alertness: Awake/alert Behavior During Therapy: WFL for tasks assessed/performed Overall Cognitive Status: Within Functional Limits for tasks  assessed                                          General Comments      Exercises     Assessment/Plan    PT Assessment Patient needs continued PT services  PT Problem List Decreased strength;Decreased activity tolerance;Decreased mobility;Decreased knowledge of precautions       PT Treatment Interventions DME instruction;Gait training;Functional mobility training;Therapeutic activities;Therapeutic exercise    PT Goals (Current goals can be found in the Care Plan section)  Acute Rehab PT Goals Patient Stated Goal: Get stronger PT Goal Formulation: With patient Time For Goal Achievement: 10/08/21 Potential to Achieve Goals: Good    Frequency Min 3X/week     Co-evaluation               AM-PAC PT "6 Clicks" Mobility  Outcome Measure Help needed turning from your back to your side while in a flat bed without using bedrails?: A Little Help needed moving from lying on your back to sitting on the side of a flat bed without using bedrails?: A Little Help needed moving to and from a bed to a chair (including a wheelchair)?: A Little Help needed standing up from a chair using your arms (e.g., wheelchair or bedside chair)?: A Little Help needed to walk in hospital room?: A Little Help needed climbing 3-5 steps with a railing? : A Little 6 Click Score: 18    End of Session Equipment Utilized During Treatment: Gait belt Activity Tolerance: Patient tolerated treatment well Patient left: in bed;with call bell/phone within reach;with bed alarm set Nurse Communication: Mobility status PT Visit Diagnosis: Muscle weakness (generalized) (M62.81);Difficulty in walking, not elsewhere classified (R26.2)    Time: 1351-1416 PT Time Calculation (min) (ACUTE ONLY): 25 min   Charges:   PT Evaluation $PT Eval Moderate Complexity: 1 Mod PT Treatments $Gait Training: 8-22 mins        Merrik Puebla L. Tamala Julian, [place]  09/24/2021   Galen Manila 09/24/2021, 5:11 PM

## 2021-09-24 NOTE — Progress Notes (Signed)
2 Days Post-Op Subjective: No acute complaints today. +OOB multiple times yesterday. -Flatus, -BM. Denies n/v  Objective: Vital signs in last 24 hours: Temp:  [97.5 F (36.4 C)-99.3 F (37.4 C)] 98.4 F (36.9 C) (01/15 0440) Pulse Rate:  [61-82] 82 (01/15 0440) Resp:  [14-18] 18 (01/15 0440) BP: (107-145)/(57-81) 136/72 (01/15 0440) SpO2:  [95 %-100 %] 97 % (01/15 0440)  Intake/Output from previous day: 01/14 0701 - 01/15 0700 In: 3813.4 [I.V.:3813.4] Out: 3250 [Urine:3000; Drains:250] Intake/Output this shift: No intake/output data recorded.  Physical Exam:  General: Alert and oriented CV: RRR Lungs: Clear Abdomen: Soft, ND, ATTP; inc c/d/I; drain with ~50 cc of s/s output; ostomy pink/patent, stents in place Ext: NT, No erythema  Lab Results: Recent Labs    09/22/21 1318 09/23/21 0534 09/24/21 0451  HGB 15.0 13.6 14.3  HCT 46.6 41.8 44.3   BMET Recent Labs    09/23/21 0534 09/24/21 0451  NA 140 141  K 4.1 3.6  CL 104 109  CO2 26 26  GLUCOSE 147* 126*  BUN 22 19  CREATININE 1.42* 1.34*  CALCIUM 8.5* 8.4*     Studies/Results: No results found.  Assessment/Plan: 81 yo M s/p robot assisted cystectomy with ileal conduit POD 2, doing well -advance diet to clears -continue OOB -labs in AM   LOS: 3 days   Donald Pore MD 09/24/2021, 8:54 AM [facility]  Pager: [phone]

## 2021-09-25 LAB — BASIC METABOLIC PANEL
Anion gap: 6 (ref 5–15)
BUN: 16 mg/dL (ref 8–23)
CO2: 26 mmol/L (ref 22–32)
Calcium: 8.1 mg/dL — ABNORMAL LOW (ref 8.9–10.3)
Chloride: 108 mmol/L (ref 98–111)
Creatinine, Ser: 1.2 mg/dL (ref 0.61–1.24)
GFR, Estimated: >60 mL/min (ref >60)
Glucose, Bld: 125 mg/dL — ABNORMAL HIGH (ref 70–99)
Potassium: 3.5 mmol/L (ref 3.5–5.1)
Sodium: 140 mmol/L (ref 135–145)

## 2021-09-25 LAB — HEMOGLOBIN AND HEMATOCRIT, BLOOD
HCT: 37.7 % — ABNORMAL LOW (ref 39.0–52.0)
Hemoglobin: 12.3 g/dL — ABNORMAL LOW (ref 13.0–17.0)

## 2021-09-25 MED ORDER — SODIUM CHLORIDE 0.9 % IV SOLN: INTRAVENOUS | Status: DC

## 2021-09-25 NOTE — Progress Notes (Signed)
° °  Subjective/Chief Complaint:  1 - Aggressive Variant Bladder Cancer - s/p cystoprostatectomy with node dissection and conduit diversion and open umbilcal hernia repair  09/22/2021 for aggressive variant bladder caner and reducible umbilical hernia. Path pending. Admitted 1/12 for stomal marking and bowel prep.  2 - Ileus  - s/p planned small bowel anastamosis as part of conduit diversion 1/13. Received entered peri-op. NPO with ic chips, Clears POD2, Vigorous bowel function POD 3.   3 - Disposition / Rehab - independent in all ADL's. Wife "May" very involved. PT eval 1/15 recs no needs. [place] working with in house for new ostomy teaching.  Today "Cloy" is continuing to progress nicely. Return of bowel function. Pain controlled. In good spirits. PT eval yesterday no needs.     Objective: Vital signs in last 24 hours: Temp:  [98.1 F (36.7 C)-99.7 F (37.6 C)] 98.4 F (36.9 C) (01/16 0439) Pulse Rate:  [68-76] 70 (01/16 0439) Resp:  [16-17] 16 (01/16 0439) BP: (125-154)/(64-75) 143/71 (01/16 0439) SpO2:  [94 %-98 %] 94 % (01/16 0439) Last BM Date: 09/24/21  Intake/Output from previous day: 01/15 0701 - 01/16 0700 In: 1241.1 [P.O.:360; I.V.:881.1] Out: 1742 [Urine:1475; Drains:267] Intake/Output this shift: Total I/O In: 240 [P.O.:240] Out: 210 [Urine:150; Drains:60]  NAD, Very pleasant, Vigorous for age, In glasses. Non-labored breathing on RA RRR SNTND, RLQ Urostomy pink / patent with Rt (red) and Lt (blue) bander stents and copious non-foul urine  scant mucus Recent scars w/o hernias.  SCD's in place.    Lab Results:  Recent Labs    09/24/21 0451 09/25/21 0414  HGB 14.3 12.3*  HCT 44.3 37.7*   BMET Recent Labs    09/24/21 0451 09/25/21 0414  NA 141 140  K 3.6 3.5  CL 109 108  CO2 26 26  GLUCOSE 126* 125*  BUN 19 16  CREATININE 1.34* 1.20  CALCIUM 8.4* 8.1*   PT/INR No results for input(s): LABPROT, INR in the last 72 hours. ABG No results for  input(s): PHART, HCO3 in the last 72 hours.  Invalid input(s): PCO2, PO2  Studies/Results: No results found.  Anti-infectives: Anti-infectives (From admission, onward)    Start     Dose/Rate Route Frequency Ordered Stop   09/22/21 1600  piperacillin-tazobactam (ZOSYN) IVPB 3.375 g        3.375 g 12.5 mL/hr over 240 Minutes Intravenous Every 8 hours 09/22/21 1502 09/23/21 1134   09/22/21 0600  piperacillin-tazobactam (ZOSYN) IVPB 3.375 g        3.375 g 100 mL/hr over 30 Minutes Intravenous 30 min pre-op 09/21/21 1340 09/22/21 0815   09/21/21 1600  metroNIDAZOLE (FLAGYL) tablet 500 mg  Status:  Discontinued        500 mg Oral Every 4 hours 09/21/21 1446 09/22/21 0359   09/21/21 1600  neomycin (MYCIFRADIN) tablet 500 mg  Status:  Discontinued        500 mg Oral Every 4 hours 09/21/21 1446 09/21/21 1447       Assessment/Plan:  Doing exceptionally well POD 3 s/p major extirpative surgery for bladder cancer. IVF to 50, continue clears, importance of TID ambulation reinforced. Stop PO senna as some loose stools. Goals for DC discussed, likely Thursday based on current progress.    [name] 09/25/2021

## 2021-09-25 NOTE — Consult Note (Addendum)
[facility] Nurse ostomy consult note Patient receiving care in [phone] Stoma type/location: RMQ ileal conduit Stomal assessment/size: 1 5/8" with two stents Peristomal assessment:  Blistering with skin loss on the left side of the stoma. Smaller skin barrier replaced to allow for this skin to heal.  Treatment options for stomal/peristomal skin: Ring barrier Output: pink with mucus around the stoma which easily came off with cleaning of the peristomal skin.  Ostomy pouching: 2pc. 2 1/4" pouch Kellie Simmering # [phone]) skin barrier Kellie Simmering # [phone]) Barrier ring Kellie Simmering # [phone]) Education provided: Spouse and two sons Aaron Edelman and [place]) present and participated in pouch change.  Explained role of ostomy nurse and creation of stoma  Explained stoma characteristics (budded, color, texture, care) Demonstrated pouch change (cutting new barrier, measuring stoma, cleaning peristomal skin and stoma, use of barrier ring) Education on emptying when 1/3 to 1/2 full and how to empty Education on urine characteristics (sediment, mucous) Demonstrated hooking pouch to nighttime drainage bag Discussed bathing dehydration  Discussed treatment of peristomal skin (skin barrier wipes) Answered patient/family questions:    Enrolled patient in [facility] program: Yes Welcome to [employer]! Thank you for choosing to enroll. A member of our team will be in touch shortly to explain our services, which include:  Personalized support from a Land, supplier, and insurance information Condition-specific information and connection to community resources If you have questions now, or in the future, please call us at [phone].  Spouse and one son will be present on Thursday at 10:00 for another teaching session. WOC will follow weekly until discharge.  Cathlean Marseilles Tamala Julian, MSN, RN, [place], Lysle Pearl, [facility] Wound Treatment Associate Pager [phone]

## 2021-09-25 NOTE — Op Note (Signed)
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  PROCEDURE PERFORMED:    1.  Cystoscopy with injection of indocyanine green dye. 2.  Robotic-assisted laparoscopic radical cystectomy. 3.  Radical prostatectomy. 4.  Bilateral pelvic lymph node dissection. 5.  Ileal conduit urinary diversion. 6.  Open umbilical hernia repair.  ESTIMATED BLOOD LOSS:  200 mL.  COMPLICATIONS:  None.  FINDINGS:   1.  Right distal ureteral margin frozen section negative. 2.  Final right distal ureteral margin. 3.  Left distal ureteral margin, frozen section negative. 4.  Final left distal ureter margin. 5.  Right external iliac lymph nodes. 6. Right obturator lymph nodes. 7.  Right common iliac lymph nodes. 8.  Left common iliac lymph nodes. 9.  Left internal iliac lymph nodes. 10.  Left external iliac lymph nodes. 11.  Left obturator lymph nodes. 12.  Bladder plus prostate en bloc.  DRAINS:   1.  Jackson-Pratt drain to bulb suction. 2.  Right lower quadrant urostomy to gravity drainage with right (red), left (blue) bander stents.  FINDINGS:   1.  Minimal volume intraluminal residual bladder tumor. 2.  Multiple bilateral sentinel lymph nodes noted on pathology requisition. 3.  Fat containing umbilical hernia fascial defect approximately 2.5 cm.  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  He has undergone transurethral resection for initial diagnostic staging purposes and actual  imaging, which revealed no distant disease. Given his aggressive variant, he was referred for consideration of definitive management with cystoprostatectomy and he  elected to proceed.  He was admitted yesterday for bowel prep, stomal marking and labs,  which were all favorable. He presents for cystectomy with conduit urinary.  He does notably have significant umbilical hernia, fat containing type.  They wished to have it addressed concomitantly for cosmesis and for safety to prevent future  strangulation of the bowel contents.  Informed consent was obtained and placed in medical record.   PROCEDURE IN DETAIL: The patient being Benjamin Hensley verified, procedure being cystoscopy with injection of indocyanine green dye, robotic cystectomy, prostatectomy, node dissection, conduit urinary diversion and umbilical hernia repair was confirmed.   Procedure timeout was performed.  Intravenous antibiotics administered.  General endotracheal anesthesia induced.  The patient was placed into a low lithotomy position.  Sterile field was created, prepped and draped the patient's penis, perineum and  proximal thigh using iodine and his infraxiphoid abdomen using chlorhexidine gluconate. He was further fashioned on the operating table using 3-inch tape with foam padding across the supra xiphoid chest.  His arms were tucked to the side using gel  rolls.  Test with a steep Trendelenburg positioning was performed.  LAVH type drape was placed.  Next, cystourethroscopy was performed with a 24-French injection cystoscope with a 0-degree lens. Inspection of anterior and posterior  urethra only revealed some significant bilobar prostatic hypertrophy.  The patient's urinary bladder revealed minimal amount of residual tumor.  There was some subtle tumor and papillary changes at the left hemitrigone area.  Next, 2 mL of indocyanine  green dye was injected into this area across approximately 5 submucosal blebs to perform tumor tattooing and sentinel lymphangiography.  A silicone type Foley catheter was then placed per  urethra to straight drain and 15 mL sterile water in the balloon.  Next, high  flow,  low pressure, pneumoperitoneum was obtained using Veress technique in the supraumbilical midline using aspiration and drop test and reducing the umbilical hernia and verifying that no bowel contents were there on CT scan. And an 8 mm robotic camera port  was then placed in the same location.  Laparoscopic examination of the patient's peritoneal cavity revealed that he had some mild loose adhesions in the area of the umbilical hernia.  Additional ports were placed as follows:  Right paramedian 8 mm  robotic port, right far lateral 12 mm AirSeal assistant port, right paramedian 15 mm assistant port at the previously marked stomal site, left paramedian 8 mm robotic port and the left far lateral 8 mm robotic port.  Limited adhesiolysis was performed  using purely laparoscopic scissors.  There was some omentum that was tethered to the umbilical hernia sac.  Next, robot was docked.  Attention was directed to development of the left retroperitoneum.  Incision was made lateral to the descending colon  from the iliac vessels superiorly for a distance of approximately 10 cm and inferiorly cruising  just lateral to the left medial umbilical ligament towards the anterior abdominal wall.  This created a very large posterior peritoneal flap, which was  reflected medially.  The left ureter was encountered. There was a crossing iliac vessel and it was marked with a vessel loop, dissected proximally for a distance of approximately 8 cm towards the area of the gonadal crossing and then distally to the  ureterovesical junction, which was doubly clipped and ligated with a proximal clip containing dye tagged suture.  Frozen section was negative for carcinoma.  The left ureter was then tucked into the true pelvis.  Attention was then directed to the left  sided pelvic lymphadenectomy.  The pelvis was inspected under near infrared fluorescence light and multiple sentinel lymphatic channels were seen.  There were multiple  sentinel lymph nodes on both sides.  These will be noted on the pathology requisition.  First the left common iliac group was dissected free with the boundaries being the aortic bifurcation, iliac bifurcation  and lymphostasis achieved with cold clips, set aside labelled as left common iliac lymph nodes.  Next, the left external iliac  group was dissected free with the boundaries being the iliac bifurcation, inguinal ring.  Lymphostasis was achieved with cold clips, set aside labeled as the left external lymph nodes.  Next the left obturator was resected free.  The boundaries being the left external iliac vein, pelvic sidewall, obturator nerve.  Lymphostasis was achieved with cold clips, set aside as left obturator lymph nodes. Left obturator nerve was inspected  following this maneuver and found to be uninjured.  Additional fibrofatty tissue overlying the internal iliac artery from the area of the iliac bifurcation to the superior vesicle artery was dissected free.  Lymphostasis was achieved with cold clips and  set aside labelled as left internal iliac lymph nodes.  The left bladder was then swept further away from the pelvic sidewall toward the endopelvic fascia, which was then swept away from the lateral side of the prostate towards the apex of the prostate.   This completed the left lateral and retroperitoneal dissection.  Attention was directed to the right side.  The ileocecal junction was identified by visualizing the appendix.  The distal ileum was traced proximally for a distance of approximately 20 cm  into an area of distal  ileum that appeared to have sufficient mobility and vascularity for conduit formation.  This was marked using silk tag clip with a single non type clip distal to this in the proximal and distal orientation. Incision made   lateral to the cecum inferiorly along the posterior peritoneum coursing lateral to the right medial umbilical ligament towards the anterior abdominal wall.   A Y-shaped extension was made from this towards the area of the aortic bifurcation following the  course of the common iliac vessels.  This created a large posterior peritoneal flap on the right side, which was grasped and reflected medially.  The right ureter was encountered as it coursed over the iliac vessels, marked with a vessel loop, dissected  proximally and distally to a distance of approximately 6 mm distally to the ureterovesical junction, which was doubly clipped and ligated, proximal clip containing a white tag suture.  Right ureter was stuck to the true pelvis.  Frozen section negative  for carcinoma.  This exposed the pelvic lymphoceles on the right side and a right sided pelvic lymph node dissection was performed as per the left using the same template.  Following this, the right obturator nerve was inspected and found to be uninjured  as well and the right bladder wall swept away from the pelvic sidewall.  Endopelvic fascia was then swept away from the prostate towards the apex of the prostate.  Next a retroperitoneal window was made directly on top of the aortic bifurcation from the right side, the left side and left ureter was brought through this retroperitoneal tunnel and the right ureter, left ureter and terminal ileum tag sutures were  placed into Hem-o-lok clip for later urinary diversion.  All these structures were out of the true pelvis.  Posterior dissection was performed by creating an inverted U-shaped incision in the posterior peritoneum behind the bladder. Incision made to  previous lateral retroperitoneal incisions and this created a posterior peritoneal flap, which developed away from the posterior bladder toward the area of the apex of the prostate.  This was developed laterally, thus exposing the vascular pedicles of  the bladder and prostate.  These were controlled using white load stapler x 2 each side, which revealed an excellent hemostatic control of the bladder and  prostate pedicles.  The space of Retzius was then developed between the median umbilical ligaments  towards the area of the prostate.  This exposed the dorsal venous complex and it was controlled using a green load stapler.  Final apical dissection was performed releasing the bladder and prostate specimen on superior traction, transecting the  membranous urethra coldly.  The silicone catheter was purposely doubly clipped, ligated and used as a bucket handle and the bladder plus prostate en bloc specimen was placed in a large EndoCatch bag for later retrieval.   Digital rectal exam was again performed using indicator glove under laparoscopic vision.  No evidence of rectal violation was noted.  Sponge and needle counts were correct.  Hemostasis was excellent. I was quite happy with extirpative portions of the procedure. Closed suction  drain was brought throughout the pubis, left lateral most robotic port site into the peritoneal cavity.  The specimen bag was brought through the left paramedian robotic port.  The right ureter, left ureter, terminal ileum tag sutures were grasped with  a self-locking laparoscopic grasper via the right side of the 15 mm assistant port, just that this did not cross the midline.  Robot was then undocked.  Specimen was retrieved  by extending the previous camera port site inferiorly to the left side of  the umbilicus for a distance of approximately 7 cm and the bladder plus prostate en bloc specimen was removed and set aside for permanent pathology.  A wound protector type retractor was then placed. The right ureter, left ureter, terminal ileum tag  sutures were brought through this and these structures were marked using a Babcock graspers individually.  Attention was placed at the conduit harvest. A 14 cm segment of distal ileum was taken to continuity at the previously marked site.  The mesentery  developed with 1.5 loads of the vascular stapler distal and one load proximal  taking exquisite care to avoid devascularization of the anastomotic segments.  The conduit was then lined to retroperitoneal orientation.  The bowel-bowel anastomosis was  performed using 1.5 loads of the green load stapler in the antimesenteric border.  The free end oversewn using running silk and A separate imbricating layer of running silk.  The acute angle anastomosis was bolstered with interrupted silk.  The  mesenteric defect was reapproximated using interrupted silk.  The bowel-bowel anastomosis was visually pliable, palpably patent with a favorable geometry and it was redelivered into abdominal cavity.  The proximal staple line of the conduit was excluded  using running Vicryl.  The distal staple line was removed as were the tag sutures.  Attention was directed at ureteroenteric anastomosis first of the left ureter. Final left margin was sent marked for permanent pathology and a 4 mm V-shaped segment of  proximal conduit was excised using Potts scissors and four mucosal everting sutures were applied.  The ureter was spatulated for a distance of approximately 1 cm.  A heel stitch of interrupted Vicryl was applied and a blue color bander stent was placed  26 mm to the anastomosis and further ureteroenteric anastomosis was performed using two separate running suture lines of 4-0 Vicryl, which showed an excellent tension-free apposition.  A chromic stitch was used to anchor the stent in the mid pole of the  conduit to prevent inadvertent early displacement.  A mirror image ureteroenteric anastomosis was performed on the right side of the right ureter by placing a red color bander stent 26 mm to the anastomosis and setting aside a separate permanent  pathology, distal margin.  I was quite happy with the geometry and apparent vascular viability of the diversion.  Next, a quarter-sized diameter column of skin and subcutaneous tissue was excised from the previous marked conduit site and the level of the   fascia was dilated to accommodate two surgeon's fingers and 2-0 Vicryl anchoring sutures were placed in the fascia in a quadrant type fashion.  Conduit was brought through this into a proper mesenteric position and the anchoring sutures were applied at  the proximal end of the conduit to prevent parastomal hernia formation.  Four rose-budding sutures were then applied in a similar fashion.  The abdomen was once again inspected at the extraction sites. Again, hemostasis was excellent.  Sponge and needle  counts were correct.  The omentum was brought over this.  The umbilical hernia sac was very carefully mobilized. Edges were visualized and the sac was excised and discarded.  The anterior fascial edges were easily visualized and the extraction site was  then closed using figure-of-eight Novafil x 7, thus performing open umbilical hernia repair apparently. Second layer of the Scarpa's was reapproximated using a running 0 Vicryl.  All incision sites were infiltrated with dilute lipolyzed Marcaine and  closed at the level of the skin using subcuticular Monocryl followed by Dermabond.  Final small maturation was performed by tying down the rose-budding sutures and then reapproximating further the skin to mucosa in interrupted fashion x 3 each  quadrant. Stoma appliance was placed. Jackson-Pratt drain, which had previously been placed through the left lateral most robotic port site was placed into JP suction.  The procedure was terminated.  The patient tolerated the procedure well, no   immediate perioperative complications.  The patient was taken to postanesthesia care in stable condition.  Plan for inpatient admission.  Please note, first assistant Debbrah Alar was crucial for all portions of the surgery today.  She provided invaluable assistance with retraction, suctioning, vascular clipping, vascular stapling and general first assistance.   MUK D: 09/22/2021 12:31:07 pm T: 09/23/2021 12:23:00 am  JOB:  [id number]/ [id number]

## 2021-09-25 NOTE — TOC CM/SW Note (Signed)
°  Transition of Care [facility]) Screening Note   Patient Details  Name: Benjamin Hensley Date of Birth: 1940/10/21   [facility]) CM/SW Contact:    Ross Ludwig, LCSW Phone Number: 09/25/2021, 7:01 PM    Transition of Care Department [facility]) has reviewed patient and no TOC needs have been identified at this time. We will continue to monitor patient advancement through interdisciplinary progression rounds. If new patient transition needs arise, please place a TOC consult.

## 2021-09-26 LAB — BASIC METABOLIC PANEL
Anion gap: 5 (ref 5–15)
BUN: 21 mg/dL (ref 8–23)
CO2: 25 mmol/L (ref 22–32)
Calcium: 7.9 mg/dL — ABNORMAL LOW (ref 8.9–10.3)
Chloride: 108 mmol/L (ref 98–111)
Creatinine, Ser: 1.12 mg/dL (ref 0.61–1.24)
GFR, Estimated: >60 mL/min (ref >60)
Glucose, Bld: 98 mg/dL (ref 70–99)
Potassium: 3.6 mmol/L (ref 3.5–5.1)
Sodium: 138 mmol/L (ref 135–145)

## 2021-09-26 LAB — HEMOGLOBIN AND HEMATOCRIT, BLOOD
HCT: 37 % — ABNORMAL LOW (ref 39.0–52.0)
Hemoglobin: 11.7 g/dL — ABNORMAL LOW (ref 13.0–17.0)

## 2021-09-26 LAB — SURGICAL PATHOLOGY

## 2021-09-26 LAB — CREATININE, FLUID (PLEURAL, PERITONEAL, JP DRAINAGE): Creat, Fluid: 1.1 mg/dL

## 2021-09-26 NOTE — Progress Notes (Signed)
4 Days Post-Op   Subjective/Chief Complaint:   1 - Aggressive Variant Bladder Cancer + Prostate Cancer - s/p cystoprostatectomy with node dissection and conduit diversion and open umbilcal hernia repair  09/22/2021 for aggressive variant bladder caner and reducible umbilical hernia. Path residual carcioma in situ + Grade 3 prostate cancer with negative nodes and margins. Admitted 1/12 for stomal marking and bowel prep.  2 - Ileus  - s/p planned small bowel anastamosis as part of conduit diversion 1/13. Received entered peri-op. NPO with ic chips, Clears POD2, Vigorous bowel function POD 3. Fulls POD 4.   3 - Disposition / Rehab - independent in all ADL's. Wife "May" very involved. PT eval 1/15 recs no needs. [place] working with in house for new ostomy teaching.  Today "Murat" is stable. Path very favorable with node and margin negative disease. Tolerating fulls with continued bowel function.    Objective: Vital signs in last 24 hours: Temp:  [97.6 F (36.4 C)-98 F (36.7 C)] 97.6 F (36.4 C) (01/17 0400) Pulse Rate:  [61-62] 61 (01/17 0400) Resp:  [16] 16 (01/16 2000) BP: (133-136)/(69-70) 133/70 (01/17 0400) SpO2:  [95 %-99 %] 99 % (01/17 0400) Last BM Date: 09/26/21  Intake/Output from previous day: 01/16 0701 - 01/17 0700 In: 880.6 [P.O.:240; I.V.:640.6] Out: 1210 [Urine:900; Drains:310] Intake/Output this shift: Total I/O In: 868.3 [I.V.:868.3] Out: 425 [Urine:400; Drains:25]  NAD, Very pleasant, Vigorous for age, In glasses.Wife at bedside, PT walking in gall.  Non-labored breathing on RA RRR SNTND, RLQ Urostomy pink / patent with Rt (red) and Lt (blue) bander stents and copious non-foul urine  scant mucus Recent scars w/o hernias.  SCD's in place.   Lab Results:  Recent Labs    09/25/21 0414 09/26/21 0423  HGB 12.3* 11.7*  HCT 37.7* 37.0*   BMET Recent Labs    09/25/21 0414 09/26/21 0423  NA 140 138  K 3.5 3.6  CL 108 108  CO2 26 25  GLUCOSE 125* 98  BUN  16 21  CREATININE 1.20 1.12  CALCIUM 8.1* 7.9*   PT/INR No results for input(s): LABPROT, INR in the last 72 hours. ABG No results for input(s): PHART, HCO3 in the last 72 hours.  Invalid input(s): PCO2, PO2  Studies/Results: No results found.  Anti-infectives: Anti-infectives (From admission, onward)    Start     Dose/Rate Route Frequency Ordered Stop   09/22/21 1600  piperacillin-tazobactam (ZOSYN) IVPB 3.375 g        3.375 g 12.5 mL/hr over 240 Minutes Intravenous Every 8 hours 09/22/21 1502 09/23/21 1134   09/22/21 0600  piperacillin-tazobactam (ZOSYN) IVPB 3.375 g        3.375 g 100 mL/hr over 30 Minutes Intravenous 30 min pre-op 09/21/21 1340 09/22/21 0815   09/21/21 1600  metroNIDAZOLE (FLAGYL) tablet 500 mg  Status:  Discontinued        500 mg Oral Every 4 hours 09/21/21 1446 09/22/21 0359   09/21/21 1600  neomycin (MYCIFRADIN) tablet 500 mg  Status:  Discontinued        500 mg Oral Every 4 hours 09/21/21 1446 09/21/21 1447       Assessment/Plan:  Doing exceptionally well POD 4. SLIV, CM consult to arrange Nord 1/19 based on current progress.   Path and overall good prognosis discussed with pt and wife.    [name] 09/26/2021

## 2021-09-26 NOTE — Care Management Important Message (Signed)
Important Message  Patient Details IM Letter given to the Patient. Name: Benjamin Hensley MRN: [id number] Date of Birth: [birth year]   Medicare Important Message Given:  Yes     Kerin Salen 09/26/2021, 11:34 AM

## 2021-09-26 NOTE — Progress Notes (Signed)
Patient ambulated this morning on hallway, tolerated well.

## 2021-09-27 LAB — BASIC METABOLIC PANEL
Anion gap: 4 — ABNORMAL LOW (ref 5–15)
BUN: 23 mg/dL (ref 8–23)
CO2: 26 mmol/L (ref 22–32)
Calcium: 8.3 mg/dL — ABNORMAL LOW (ref 8.9–10.3)
Chloride: 107 mmol/L (ref 98–111)
Creatinine, Ser: 1.12 mg/dL (ref 0.61–1.24)
GFR, Estimated: >60 mL/min (ref >60)
Glucose, Bld: 101 mg/dL — ABNORMAL HIGH (ref 70–99)
Potassium: 3.9 mmol/L (ref 3.5–5.1)
Sodium: 137 mmol/L (ref 135–145)

## 2021-09-27 LAB — HEMOGLOBIN AND HEMATOCRIT, BLOOD
HCT: 37.2 % — ABNORMAL LOW (ref 39.0–52.0)
Hemoglobin: 12.1 g/dL — ABNORMAL LOW (ref 13.0–17.0)

## 2021-09-27 NOTE — Progress Notes (Signed)
5 Days Post-Op   Subjective/Chief Complaint:   1 - Aggressive Variant Bladder Cancer + Prostate Cancer - s/p cystoprostatectomy with node dissection and conduit diversion and open umbilcal hernia repair  09/22/2021 for aggressive variant bladder caner and reducible umbilical hernia. Path residual carcioma in situ + Grade 3 prostate cancer with negative nodes and margins. Admitted 1/12 for stomal marking and bowel prep. JP Cr same as serum POD 4.   2 - Ileus  - s/p planned small bowel anastamosis as part of conduit diversion 1/13. Received entered peri-op. NPO with ic chips, Clears POD2, Vigorous bowel function POD 3. Fulls POD 4. Reg diet POD 5.   3 - Disposition / Rehab - independent in all ADL's. Wife "May" very involved. PT eval 1/15 recs no needs. [place] working with in house for new ostomy teaching.  Today "Benjamin Hensley" is continuing to improve, likely DC tomorrow. Has ostomy RN visit in house scheduled for AM and family going to participate.    Objective: Vital signs in last 24 hours: Temp:  [98.7 F (37.1 C)-99 F (37.2 C)] 98.8 F (37.1 C) (01/18 1509) Pulse Rate:  [62-75] 75 (01/18 1509) Resp:  [18] 18 (01/18 1509) BP: (127-137)/(64-72) 137/72 (01/18 1509) SpO2:  [93 %-96 %] 96 % (01/18 1509) Last BM Date: 09/26/21  Intake/Output from previous day: 01/17 0701 - 01/18 0700 In: 868.3 [I.V.:868.3] Out: 1805 [Urine:1750; Drains:55] Intake/Output this shift: Total I/O In: 240 [P.O.:240] Out: 550 [Urine:550]   NAD, Very pleasant, Vigorous for age, In glasses.Wife and sons at bedside.  Non-labored breathing on RA RRR SNTND, RLQ Urostomy pink / patent with Rt (red) and Lt (blue) bander stents and copious non-foul urine  scant mucus Recent scars w/o hernias.  SCD's in place.   Lab Results:  Recent Labs    09/26/21 0423 09/27/21 0426  HGB 11.7* 12.1*  HCT 37.0* 37.2*   BMET Recent Labs    09/26/21 0423 09/27/21 0426  NA 138 137  K 3.6 3.9  CL 108 107  CO2 25 26   GLUCOSE 98 101*  BUN 21 23  CREATININE 1.12 1.12  CALCIUM 7.9* 8.3*   PT/INR No results for input(s): LABPROT, INR in the last 72 hours. ABG No results for input(s): PHART, HCO3 in the last 72 hours.  Invalid input(s): PCO2, PO2  Studies/Results: No results found.  Anti-infectives: Anti-infectives (From admission, onward)    Start     Dose/Rate Route Frequency Ordered Stop   09/22/21 1600  piperacillin-tazobactam (ZOSYN) IVPB 3.375 g        3.375 g 12.5 mL/hr over 240 Minutes Intravenous Every 8 hours 09/22/21 1502 09/23/21 1134   09/22/21 0600  piperacillin-tazobactam (ZOSYN) IVPB 3.375 g        3.375 g 100 mL/hr over 30 Minutes Intravenous 30 min pre-op 09/21/21 1340 09/22/21 0815   09/21/21 1600  metroNIDAZOLE (FLAGYL) tablet 500 mg  Status:  Discontinued        500 mg Oral Every 4 hours 09/21/21 1446 09/22/21 0359   09/21/21 1600  neomycin (MYCIFRADIN) tablet 500 mg  Status:  Discontinued        500 mg Oral Every 4 hours 09/21/21 1446 09/21/21 1447       Assessment/Plan:  Doing exceptionally well POD 5 s/p major surgery at his age. Reg diet, stop labs, likely DC tomorrow. HHRN and case management consult requested. Family updated on plan of care and in agreement.    [name] 09/27/2021

## 2021-09-27 NOTE — Plan of Care (Signed)
  Problem: Skin Integrity: Goal: Risk for impaired skin integrity will decrease Outcome: Progressing   

## 2021-09-27 NOTE — Plan of Care (Signed)
  Problem: Nutrition: Goal: Adequate nutrition will be maintained Outcome: Progressing   Problem: Skin Integrity: Goal: Risk for impaired skin integrity will decrease Outcome: Progressing   

## 2021-09-27 NOTE — Progress Notes (Signed)
Physical Therapy Treatment and Discharge Patient Details Name: Benjamin Hensley MRN: [id number] DOB: 23-Jun-1941 Today's Date: 09/27/2021   History of Present Illness Pt s/p XI ROBOTIC ASSISTED LAPAROSCOPIC COMPLETE CYSTECT ILEAL CONDUIT, ROBOTIC ASSISTED LAPAROSCOPIC RADICAL PROSTATECTOMY, LYMPH NODE DISSECTIONand  OPEN HERNIA REPAIR UMBILICAL on 6/54/65 due to bladder CA.    PT Comments    Pt progressing well and goals met.  No further therapy needs.  Ambulated in hallway with supervision safely.  Demonstrated safe balance and steady dynamic gait (21/24 indicating low fall risk) and stairs.   Safe to ambulate with family.  Recommendations for follow up therapy are one component of a multi-disciplinary discharge planning process, led by the attending physician.  Recommendations may be updated based on patient status, additional functional criteria and insurance authorization.  Follow Up Recommendations  No PT follow up     Assistance Recommended at Discharge PRN  Patient can return home with the following Assistance with cooking/housework   Equipment Recommendations  None recommended by PT    Recommendations for Other Services       Precautions / Restrictions Precautions Precautions: Fall Restrictions Weight Bearing Restrictions: No     Mobility  Bed Mobility Overal bed mobility: Independent                  Transfers Overall transfer level: Modified independent   Transfers: Sit to/from Stand Sit to Stand: Modified independent (Device/Increase time)           General transfer comment: Demonstrated sit to stand safely    Ambulation/Gait Ambulation/Gait assistance: Supervision Gait Distance (Feet): 300 Feet Assistive device: None Gait Pattern/deviations: WFL(Within Functional Limits) Gait velocity: normal     General Gait Details: Demonstrated normal gait safely with supervision   Stairs Stairs: Yes Stairs assistance: Supervision Stair Management:  One rail Right, Alternating pattern, Forwards Number of Stairs: 13     Wheelchair Mobility    Modified Rankin (Stroke Patients Only)       Balance Overall balance assessment: Independent   Sitting balance-Leahy Scale: Normal     Standing balance support: No upper extremity supported Standing balance-Leahy Scale: Good                   Standardized Balance Assessment Standardized Balance Assessment : Dynamic Gait Index   Dynamic Gait Index Level Surface: Normal Change in Gait Speed: Normal Gait with Horizontal Head Turns: Normal Gait with Vertical Head Turns: Normal Gait and Pivot Turn: Normal Step Over Obstacle: Mild Impairment Step Around Obstacles: Mild Impairment Steps: Mild Impairment Total Score: 21      Cognition Arousal/Alertness: Awake/alert Behavior During Therapy: WFL for tasks assessed/performed Overall Cognitive Status: Within Functional Limits for tasks assessed                                          Exercises      General Comments General comments (skin integrity, edema, etc.): VSS; educated no further PT needs      Pertinent Vitals/Pain Pain Assessment Pain Assessment: No/denies pain    Home Living                          Prior Function            PT Goals (current goals can now be found in the care plan section) Progress towards PT goals: Goals  met/education completed, patient discharged from PT    Frequency           PT Plan      Co-evaluation              AM-PAC PT "6 Clicks" Mobility   Outcome Measure  Help needed turning from your back to your side while in a flat bed without using bedrails?: None Help needed moving from lying on your back to sitting on the side of a flat bed without using bedrails?: None Help needed moving to and from a bed to a chair (including a wheelchair)?: None Help needed standing up from a chair using your arms (e.g., wheelchair or bedside chair)?:  None Help needed to walk in hospital room?: None Help needed climbing 3-5 steps with a railing? : A Little 6 Click Score: 23    End of Session   Activity Tolerance: Patient tolerated treatment well Patient left: in bed;with call bell/phone within reach;with bed alarm set Nurse Communication: Mobility status PT Visit Diagnosis: Muscle weakness (generalized) (M62.81);Difficulty in walking, not elsewhere classified (R26.2)     Time: 1210-1220 PT Time Calculation (min) (ACUTE ONLY): 10 min  Charges:  $Gait Training: 8-22 mins                     Abran Richard, PT Acute Rehab Services Pager [phone] [facility] [place] 09/27/2021, 12:31 PM

## 2021-09-28 MED ORDER — OXYCODONE-ACETAMINOPHEN 5-325 MG PO TABS: 1.0000 | ORAL_TABLET | Freq: Four times a day (QID) | ORAL | 0 refills | Status: DC | PRN

## 2021-09-28 NOTE — Discharge Summary (Signed)
Physician Discharge Summary  Patient ID: Benjamin Hensley MRN: [id number] DOB/AGE: [birth year]-Benjamin Hensley-1942 79 y.o.  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 Path residual carcioma in situ + Grade 3 prostate cancer with negative nodes and margins. Admitted 1/12 for stomal marking and bowel prep. JP Cr same as serum POD 4.   2 - Ileus  - s/p planned small bowel anastamosis as part of conduit diversion 1/13. Received entered peri-op. NPO with ic chips, Clears POD2, Vigorous bowel function POD 3. Fulls POD 4. Reg diet POD 5.   3 - Disposition / Rehab - independent in all ADL's. Wife "Benjamin Hensley" very involved. PT eval 1/15 recs no needs. Benjamin Hensley working with in house for new ostomy teaching and to be arranged ro home at discharge.   Consults: None  Significant Diagnostic Studies: labs: as per above.   Treatments: surgery: as per above  Discharge Exam:  NAD, Very pleasant, Vigorous for age, In glasses.Wife and son at bedside.  Non-labored breathing on RA RRR SNTND, RLQ Urostomy pink / patent with Rt (red) and Lt (blue) bander stents and copious non-foul urine  scant mucus JP removed (output non-foul serous) and dry dressing placed Recent scars w/o hernias.  SCD's in place.   Blood pressure 124/78, pulse 77, temperature 98.1 F (36.7 C), temperature source Oral, resp. rate 18, height 5\' 11"  (1.803 m), weight 86.8 kg, SpO2 97 %.    Disposition: HOME   Allergies as of 09/28/2021   No Known Allergies      Medication List     STOP taking these medications     HYDROcodone-acetaminophen 5-325 MG tablet Commonly known as: NORCO/VICODIN       TAKE these medications    acetaminophen 500 MG tablet Commonly known as: TYLENOL Take 1,000 mg by mouth every 6 (six) hours as needed for moderate pain or headache.   atorvastatin 10 MG tablet Commonly known as: LIPITOR Take 10 mg by mouth daily.   EMERGEN-C VITAMIN C PO Take 1 Package by mouth daily.   oxyCODONE-acetaminophen 5-325 MG tablet Commonly known as: Percocet Take 1 tablet by mouth every 6 (six) hours as needed for severe pain or moderate pain (post-operatively).   sertraline 50 MG tablet Commonly known as: ZOLOFT Take 50 mg by mouth daily.        Follow-up Information     [name], MD Follow up on 10/16/2021.   Specialty: Urology Why: at 10:45 Am for MD visit. Contact information: [address] [place] [address] [phone]                 Signed: [name] 09/28/2021, 2:22 PM

## 2021-09-28 NOTE — TOC Transition Note (Signed)
Transition of Care [facility]) - CM/SW Discharge Note   Patient Details  Name: Benjamin Hensley MRN: [id number] Date of Birth: 1941-04-02  [facility]) CM/SW Contact:  Dessa Phi, RN Phone Number: 09/28/2021, 2:51 PM   Clinical Narrative:  d/c home w/[facility]. No further CM needs.     Final next level of care: Home w Home Health Services Barriers to Discharge: No Barriers Identified   Patient Goals and CMS Choice   CMS Medicare.gov Compare Post Acute Care list provided to:: Patient Choice offered to / list presented to : Patient  Discharge Placement                       Discharge Plan and Services                          HH Arranged: RN [facility] Agency: [facility] Date [place]: 09/28/21 Time [place]: 3329 Representative spoke with at [place]: [place] ([place]) Interventions     Readmission Risk Interventions No flowsheet data found.

## 2021-09-28 NOTE — Progress Notes (Signed)
Notified on call provider about patient having a bit more JP output than the day before. On call provider mentioned that it might occur with more movement. Patient did ambulate at least 5 times on 09/27/21 and climbed a flight of stairs. Passed along to dayshift nurse.

## 2021-09-28 NOTE — Progress Notes (Signed)
Pt and [name] Wife and Sons given discharge teaching including all Medications and schedules for these Medications. [place] RN visited Pt and Family earlier this AM for home ileal conduit care. HH has been arranged by Care Manager. Pt and Family verbalized understanding of all discharge teaching/instructions. Discharge AVS with Pt at time of discharge.

## 2021-09-28 NOTE — Consult Note (Signed)
Benjamin Hensley Nurse ostomy follow up Patient receiving care in [place]. Sons Abagail Kitchens and Bryan, and wife present for teaching session. Stoma type/location: RUQ urostomy Stomal assessment/size: 1 3/8 inches, round, moist, sutures intact, stent intact and draining urine from both Peristomal assessment: intact Treatment options for stomal/peristomal skin: barrier ring Output: yellow urine Ostomy pouching: 2pc. 2 1/4 inch pouch and skin barrier with barrier ring.  Six of each of these items provided for discharge home, as well as a new bedside drainage bag and adapter. Education provided:  The spouse completed the entire pouch removal, new system preparation and application process today.  She, the patient, and both sons asked many thoughtful and important questions during the hour long teaching session. The spouse did a superior job with the ostomy care she provided today.  No further inpatient teaching is required.  I hope they will have the opportunity to receive home health support for a while.  Enrolled patient in [place] Discharge program: Yes, previously.  Val Riles, RN, MSN, CWOCN, CNS-BC, pager [phone]

## 2021-09-29 DIAGNOSIS — L219 Seborrheic dermatitis, unspecified: Secondary | ICD-10-CM | POA: Diagnosis not present

## 2021-09-30 DIAGNOSIS — K567 Ileus, unspecified: Secondary | ICD-10-CM | POA: Diagnosis not present

## 2021-09-30 DIAGNOSIS — Z936 Other artificial openings of urinary tract status: Secondary | ICD-10-CM | POA: Diagnosis not present

## 2021-09-30 DIAGNOSIS — Z48815 Encounter for surgical aftercare following surgery on the digestive system: Secondary | ICD-10-CM | POA: Diagnosis not present

## 2021-09-30 DIAGNOSIS — Z87891 Personal history of nicotine dependence: Secondary | ICD-10-CM | POA: Diagnosis not present

## 2021-09-30 DIAGNOSIS — Z483 Aftercare following surgery for neoplasm: Secondary | ICD-10-CM | POA: Diagnosis not present

## 2021-09-30 DIAGNOSIS — Z906 Acquired absence of other parts of urinary tract: Secondary | ICD-10-CM | POA: Diagnosis not present

## 2021-09-30 DIAGNOSIS — C679 Malignant neoplasm of bladder, unspecified: Secondary | ICD-10-CM | POA: Diagnosis not present

## 2021-09-30 DIAGNOSIS — Z9181 History of falling: Secondary | ICD-10-CM | POA: Diagnosis not present

## 2021-10-02 DIAGNOSIS — Z87891 Personal history of nicotine dependence: Secondary | ICD-10-CM | POA: Diagnosis not present

## 2021-10-02 DIAGNOSIS — Z483 Aftercare following surgery for neoplasm: Secondary | ICD-10-CM | POA: Diagnosis not present

## 2021-10-02 DIAGNOSIS — Z936 Other artificial openings of urinary tract status: Secondary | ICD-10-CM | POA: Diagnosis not present

## 2021-10-02 DIAGNOSIS — C679 Malignant neoplasm of bladder, unspecified: Secondary | ICD-10-CM | POA: Diagnosis not present

## 2021-10-02 DIAGNOSIS — Z48815 Encounter for surgical aftercare following surgery on the digestive system: Secondary | ICD-10-CM | POA: Diagnosis not present

## 2021-10-02 DIAGNOSIS — Z906 Acquired absence of other parts of urinary tract: Secondary | ICD-10-CM | POA: Diagnosis not present

## 2021-10-02 DIAGNOSIS — Z9181 History of falling: Secondary | ICD-10-CM | POA: Diagnosis not present

## 2021-10-02 DIAGNOSIS — K567 Ileus, unspecified: Secondary | ICD-10-CM | POA: Diagnosis not present

## 2021-10-05 DIAGNOSIS — K567 Ileus, unspecified: Secondary | ICD-10-CM | POA: Diagnosis not present

## 2021-10-05 DIAGNOSIS — Z906 Acquired absence of other parts of urinary tract: Secondary | ICD-10-CM | POA: Diagnosis not present

## 2021-10-05 DIAGNOSIS — Z936 Other artificial openings of urinary tract status: Secondary | ICD-10-CM | POA: Diagnosis not present

## 2021-10-05 DIAGNOSIS — C679 Malignant neoplasm of bladder, unspecified: Secondary | ICD-10-CM | POA: Diagnosis not present

## 2021-10-05 DIAGNOSIS — Z48815 Encounter for surgical aftercare following surgery on the digestive system: Secondary | ICD-10-CM | POA: Diagnosis not present

## 2021-10-05 DIAGNOSIS — Z87891 Personal history of nicotine dependence: Secondary | ICD-10-CM | POA: Diagnosis not present

## 2021-10-05 DIAGNOSIS — Z9181 History of falling: Secondary | ICD-10-CM | POA: Diagnosis not present

## 2021-10-05 DIAGNOSIS — Z483 Aftercare following surgery for neoplasm: Secondary | ICD-10-CM | POA: Diagnosis not present

## 2021-10-09 DIAGNOSIS — Z9181 History of falling: Secondary | ICD-10-CM | POA: Diagnosis not present

## 2021-10-09 DIAGNOSIS — K567 Ileus, unspecified: Secondary | ICD-10-CM | POA: Diagnosis not present

## 2021-10-09 DIAGNOSIS — Z906 Acquired absence of other parts of urinary tract: Secondary | ICD-10-CM | POA: Diagnosis not present

## 2021-10-09 DIAGNOSIS — Z936 Other artificial openings of urinary tract status: Secondary | ICD-10-CM | POA: Diagnosis not present

## 2021-10-09 DIAGNOSIS — Z87891 Personal history of nicotine dependence: Secondary | ICD-10-CM | POA: Diagnosis not present

## 2021-10-09 DIAGNOSIS — Z48815 Encounter for surgical aftercare following surgery on the digestive system: Secondary | ICD-10-CM | POA: Diagnosis not present

## 2021-10-09 DIAGNOSIS — Z483 Aftercare following surgery for neoplasm: Secondary | ICD-10-CM | POA: Diagnosis not present

## 2021-10-09 DIAGNOSIS — C679 Malignant neoplasm of bladder, unspecified: Secondary | ICD-10-CM | POA: Diagnosis not present

## 2021-10-12 DIAGNOSIS — Z936 Other artificial openings of urinary tract status: Secondary | ICD-10-CM | POA: Diagnosis not present

## 2021-10-12 DIAGNOSIS — Z906 Acquired absence of other parts of urinary tract: Secondary | ICD-10-CM | POA: Diagnosis not present

## 2021-10-12 DIAGNOSIS — K567 Ileus, unspecified: Secondary | ICD-10-CM | POA: Diagnosis not present

## 2021-10-12 DIAGNOSIS — Z9181 History of falling: Secondary | ICD-10-CM | POA: Diagnosis not present

## 2021-10-12 DIAGNOSIS — Z87891 Personal history of nicotine dependence: Secondary | ICD-10-CM | POA: Diagnosis not present

## 2021-10-12 DIAGNOSIS — C679 Malignant neoplasm of bladder, unspecified: Secondary | ICD-10-CM | POA: Diagnosis not present

## 2021-10-12 DIAGNOSIS — Z483 Aftercare following surgery for neoplasm: Secondary | ICD-10-CM | POA: Diagnosis not present

## 2021-10-12 DIAGNOSIS — Z48815 Encounter for surgical aftercare following surgery on the digestive system: Secondary | ICD-10-CM | POA: Diagnosis not present

## 2021-10-14 DIAGNOSIS — Z936 Other artificial openings of urinary tract status: Secondary | ICD-10-CM | POA: Diagnosis not present

## 2021-10-16 DIAGNOSIS — R8271 Bacteriuria: Secondary | ICD-10-CM | POA: Diagnosis not present

## 2021-10-17 DIAGNOSIS — Z936 Other artificial openings of urinary tract status: Secondary | ICD-10-CM | POA: Diagnosis not present

## 2021-10-17 DIAGNOSIS — Z8551 Personal history of malignant neoplasm of bladder: Secondary | ICD-10-CM | POA: Diagnosis not present

## 2021-10-19 DIAGNOSIS — Z9181 History of falling: Secondary | ICD-10-CM | POA: Diagnosis not present

## 2021-10-19 DIAGNOSIS — C679 Malignant neoplasm of bladder, unspecified: Secondary | ICD-10-CM | POA: Diagnosis not present

## 2021-10-19 DIAGNOSIS — Z906 Acquired absence of other parts of urinary tract: Secondary | ICD-10-CM | POA: Diagnosis not present

## 2021-10-19 DIAGNOSIS — Z936 Other artificial openings of urinary tract status: Secondary | ICD-10-CM | POA: Diagnosis not present

## 2021-10-19 DIAGNOSIS — Z48815 Encounter for surgical aftercare following surgery on the digestive system: Secondary | ICD-10-CM | POA: Diagnosis not present

## 2021-10-19 DIAGNOSIS — K567 Ileus, unspecified: Secondary | ICD-10-CM | POA: Diagnosis not present

## 2021-10-19 DIAGNOSIS — Z87891 Personal history of nicotine dependence: Secondary | ICD-10-CM | POA: Diagnosis not present

## 2021-10-19 DIAGNOSIS — Z483 Aftercare following surgery for neoplasm: Secondary | ICD-10-CM | POA: Diagnosis not present

## 2021-10-26 DIAGNOSIS — Z906 Acquired absence of other parts of urinary tract: Secondary | ICD-10-CM | POA: Diagnosis not present

## 2021-10-26 DIAGNOSIS — Z87891 Personal history of nicotine dependence: Secondary | ICD-10-CM | POA: Diagnosis not present

## 2021-10-26 DIAGNOSIS — Z483 Aftercare following surgery for neoplasm: Secondary | ICD-10-CM | POA: Diagnosis not present

## 2021-10-26 DIAGNOSIS — K567 Ileus, unspecified: Secondary | ICD-10-CM | POA: Diagnosis not present

## 2021-10-26 DIAGNOSIS — Z48815 Encounter for surgical aftercare following surgery on the digestive system: Secondary | ICD-10-CM | POA: Diagnosis not present

## 2021-10-26 DIAGNOSIS — C679 Malignant neoplasm of bladder, unspecified: Secondary | ICD-10-CM | POA: Diagnosis not present

## 2021-10-26 DIAGNOSIS — Z936 Other artificial openings of urinary tract status: Secondary | ICD-10-CM | POA: Diagnosis not present

## 2021-10-26 DIAGNOSIS — Z9181 History of falling: Secondary | ICD-10-CM | POA: Diagnosis not present

## 2021-10-31 DIAGNOSIS — Z483 Aftercare following surgery for neoplasm: Secondary | ICD-10-CM | POA: Diagnosis not present

## 2021-11-01 DIAGNOSIS — Z483 Aftercare following surgery for neoplasm: Secondary | ICD-10-CM | POA: Diagnosis not present

## 2021-11-02 DIAGNOSIS — Z906 Acquired absence of other parts of urinary tract: Secondary | ICD-10-CM | POA: Diagnosis not present

## 2021-11-02 DIAGNOSIS — K567 Ileus, unspecified: Secondary | ICD-10-CM | POA: Diagnosis not present

## 2021-11-02 DIAGNOSIS — Z48815 Encounter for surgical aftercare following surgery on the digestive system: Secondary | ICD-10-CM | POA: Diagnosis not present

## 2021-11-02 DIAGNOSIS — Z87891 Personal history of nicotine dependence: Secondary | ICD-10-CM | POA: Diagnosis not present

## 2021-11-02 DIAGNOSIS — Z483 Aftercare following surgery for neoplasm: Secondary | ICD-10-CM | POA: Diagnosis not present

## 2021-11-02 DIAGNOSIS — Z9181 History of falling: Secondary | ICD-10-CM | POA: Diagnosis not present

## 2021-11-02 DIAGNOSIS — Z936 Other artificial openings of urinary tract status: Secondary | ICD-10-CM | POA: Diagnosis not present

## 2021-11-02 DIAGNOSIS — C679 Malignant neoplasm of bladder, unspecified: Secondary | ICD-10-CM | POA: Diagnosis not present

## 2021-11-06 DIAGNOSIS — R8271 Bacteriuria: Secondary | ICD-10-CM | POA: Diagnosis not present

## 2021-11-09 DIAGNOSIS — Z936 Other artificial openings of urinary tract status: Secondary | ICD-10-CM | POA: Diagnosis not present

## 2021-11-09 DIAGNOSIS — Z87891 Personal history of nicotine dependence: Secondary | ICD-10-CM | POA: Diagnosis not present

## 2021-11-09 DIAGNOSIS — C679 Malignant neoplasm of bladder, unspecified: Secondary | ICD-10-CM | POA: Diagnosis not present

## 2021-11-09 DIAGNOSIS — Z48815 Encounter for surgical aftercare following surgery on the digestive system: Secondary | ICD-10-CM | POA: Diagnosis not present

## 2021-11-09 DIAGNOSIS — Z483 Aftercare following surgery for neoplasm: Secondary | ICD-10-CM | POA: Diagnosis not present

## 2021-11-09 DIAGNOSIS — Z906 Acquired absence of other parts of urinary tract: Secondary | ICD-10-CM | POA: Diagnosis not present

## 2021-11-09 DIAGNOSIS — Z9181 History of falling: Secondary | ICD-10-CM | POA: Diagnosis not present

## 2021-11-09 DIAGNOSIS — K567 Ileus, unspecified: Secondary | ICD-10-CM | POA: Diagnosis not present

## 2021-11-14 ENCOUNTER — Encounter (HOSPITAL_COMMUNITY): Payer: Self-pay

## 2021-11-14 ENCOUNTER — Inpatient Hospital Stay (HOSPITAL_COMMUNITY)
Admission: EM | Admit: 2021-11-14 | Discharge: 2021-11-16 | DRG: 690 | Disposition: A | Discharge status: Home / Self Care | Payer: Medicare Other | Attending: Family Medicine | Admitting: Family Medicine

## 2021-11-14 ENCOUNTER — Other Ambulatory Visit: Payer: Self-pay

## 2021-11-14 ENCOUNTER — Emergency Department (HOSPITAL_COMMUNITY): Payer: Medicare Other

## 2021-11-14 DIAGNOSIS — R6889 Other general symptoms and signs: Secondary | ICD-10-CM

## 2021-11-14 DIAGNOSIS — Z9079 Acquired absence of other genital organ(s): Secondary | ICD-10-CM | POA: Diagnosis not present

## 2021-11-14 DIAGNOSIS — B9620 Unspecified Escherichia coli [E. coli] as the cause of diseases classified elsewhere: Secondary | ICD-10-CM | POA: Diagnosis present

## 2021-11-14 DIAGNOSIS — N134 Hydroureter: Secondary | ICD-10-CM | POA: Diagnosis not present

## 2021-11-14 DIAGNOSIS — R1032 Left lower quadrant pain: Secondary | ICD-10-CM | POA: Diagnosis not present

## 2021-11-14 DIAGNOSIS — R5381 Other malaise: Secondary | ICD-10-CM | POA: Diagnosis not present

## 2021-11-14 DIAGNOSIS — N136 Pyonephrosis: Principal | ICD-10-CM | POA: Diagnosis present

## 2021-11-14 DIAGNOSIS — B961 Klebsiella pneumoniae [K. pneumoniae] as the cause of diseases classified elsewhere: Secondary | ICD-10-CM | POA: Diagnosis present

## 2021-11-14 DIAGNOSIS — Z87891 Personal history of nicotine dependence: Secondary | ICD-10-CM | POA: Diagnosis not present

## 2021-11-14 DIAGNOSIS — E785 Hyperlipidemia, unspecified: Secondary | ICD-10-CM | POA: Diagnosis present

## 2021-11-14 DIAGNOSIS — K5900 Constipation, unspecified: Secondary | ICD-10-CM | POA: Diagnosis present

## 2021-11-14 DIAGNOSIS — Z79899 Other long term (current) drug therapy: Secondary | ICD-10-CM | POA: Diagnosis not present

## 2021-11-14 DIAGNOSIS — R6883 Chills (without fever): Secondary | ICD-10-CM | POA: Diagnosis not present

## 2021-11-14 DIAGNOSIS — N390 Urinary tract infection, site not specified: Secondary | ICD-10-CM | POA: Diagnosis not present

## 2021-11-14 DIAGNOSIS — C61 Malignant neoplasm of prostate: Secondary | ICD-10-CM | POA: Diagnosis present

## 2021-11-14 DIAGNOSIS — Z20822 Contact with and (suspected) exposure to covid-19: Secondary | ICD-10-CM | POA: Diagnosis present

## 2021-11-14 DIAGNOSIS — Z801 Family history of malignant neoplasm of trachea, bronchus and lung: Secondary | ICD-10-CM

## 2021-11-14 DIAGNOSIS — C679 Malignant neoplasm of bladder, unspecified: Secondary | ICD-10-CM | POA: Diagnosis not present

## 2021-11-14 DIAGNOSIS — D72828 Other elevated white blood cell count: Secondary | ICD-10-CM | POA: Diagnosis not present

## 2021-11-14 DIAGNOSIS — I700 Atherosclerosis of aorta: Secondary | ICD-10-CM | POA: Diagnosis not present

## 2021-11-14 LAB — URINALYSIS, ROUTINE W REFLEX MICROSCOPIC
Bilirubin Urine: NEGATIVE
Glucose, UA: NEGATIVE mg/dL
Ketones, ur: NEGATIVE mg/dL
Nitrite: POSITIVE — AB
Protein, ur: 100 mg/dL — AB
Specific Gravity, Urine: 1.014 (ref 1.005–1.030)
WBC, UA: >50 WBC/hpf — ABNORMAL HIGH (ref 0–5)
pH: 5 (ref 5.0–8.0)

## 2021-11-14 LAB — CBC
HCT: 40.9 % (ref 39.0–52.0)
Hemoglobin: 13.1 g/dL (ref 13.0–17.0)
MCH: 27 pg (ref 26.0–34.0)
MCHC: 32 g/dL (ref 30.0–36.0)
MCV: 84.3 fL (ref 80.0–100.0)
Platelets: 160 10*3/uL (ref 150–400)
RBC: 4.85 MIL/uL (ref 4.22–5.81)
RDW: 13.4 % (ref 11.5–15.5)
WBC: 10.3 10*3/uL (ref 4.0–10.5)
nRBC: 0 % (ref 0.0–0.2)

## 2021-11-14 LAB — COMPREHENSIVE METABOLIC PANEL
ALT: 20 U/L (ref 0–44)
AST: 20 U/L (ref 15–41)
Albumin: 3.6 g/dL (ref 3.5–5.0)
Alkaline Phosphatase: 58 U/L (ref 38–126)
Anion gap: 9 (ref 5–15)
BUN: 32 mg/dL — ABNORMAL HIGH (ref 8–23)
CO2: 22 mmol/L (ref 22–32)
Calcium: 8.7 mg/dL — ABNORMAL LOW (ref 8.9–10.3)
Chloride: 107 mmol/L (ref 98–111)
Creatinine, Ser: 1.45 mg/dL — ABNORMAL HIGH (ref 0.61–1.24)
GFR, Estimated: 49 mL/min — ABNORMAL LOW (ref >60)
Glucose, Bld: 116 mg/dL — ABNORMAL HIGH (ref 70–99)
Potassium: 4.2 mmol/L (ref 3.5–5.1)
Sodium: 138 mmol/L (ref 135–145)
Total Bilirubin: 0.7 mg/dL (ref 0.3–1.2)
Total Protein: 7.2 g/dL (ref 6.5–8.1)

## 2021-11-14 LAB — RESP PANEL BY RT-PCR (FLU A&B, COVID) ARPGX2
Influenza A by PCR: NEGATIVE
Influenza B by PCR: NEGATIVE
SARS Coronavirus 2 by RT PCR: NEGATIVE

## 2021-11-14 LAB — LIPASE, BLOOD: Lipase: 38 U/L (ref 11–51)

## 2021-11-14 MED ORDER — SERTRALINE HCL 50 MG PO TABS
50.0000 mg | ORAL_TABLET | Freq: Every day | ORAL | Status: DC
  Administered 2021-11-15 – 2021-11-16 (×2): 50 mg via ORAL
  Filled 2021-11-14 (×3): qty 1

## 2021-11-14 MED ORDER — ACETAMINOPHEN 650 MG RE SUPP: 650.0000 mg | Freq: Four times a day (QID) | RECTAL | Status: DC | PRN

## 2021-11-14 MED ORDER — SODIUM CHLORIDE 0.9 % IV SOLN
1.0000 g | INTRAVENOUS | Status: DC
  Filled 2021-11-14: qty 10

## 2021-11-14 MED ORDER — ATORVASTATIN CALCIUM 10 MG PO TABS
10.0000 mg | ORAL_TABLET | Freq: Every day | ORAL | Status: DC
  Administered 2021-11-15 – 2021-11-16 (×2): 10 mg via ORAL
  Filled 2021-11-14 (×3): qty 1

## 2021-11-14 MED ORDER — ACETAMINOPHEN 325 MG PO TABS
650.0000 mg | ORAL_TABLET | Freq: Four times a day (QID) | ORAL | Status: DC | PRN
Start: 2021-11-14 — End: 2021-11-16

## 2021-11-14 MED ORDER — ONDANSETRON HCL 4 MG PO TABS
4.0000 mg | ORAL_TABLET | Freq: Four times a day (QID) | ORAL | Status: DC | PRN
Start: 2021-11-14 — End: 2021-11-16
  Administered 2021-11-16: 11:00:00 4 mg via ORAL
  Filled 2021-11-14: qty 1

## 2021-11-14 MED ORDER — SODIUM CHLORIDE 0.9 % IV BOLUS
1000.0000 mL | Freq: Once | INTRAVENOUS | Status: AC
  Administered 2021-11-14: 1000 mL via INTRAVENOUS

## 2021-11-14 MED ORDER — IOHEXOL 300 MG/ML  SOLN
100.0000 mL | Freq: Once | INTRAMUSCULAR | Status: AC | PRN
  Administered 2021-11-14: 100 mL via INTRAVENOUS

## 2021-11-14 MED ORDER — ENOXAPARIN SODIUM 40 MG/0.4ML IJ SOSY
40.0000 mg | PREFILLED_SYRINGE | INTRAMUSCULAR | Status: DC
  Administered 2021-11-14 – 2021-11-15 (×2): 40 mg via SUBCUTANEOUS
  Filled 2021-11-14 (×2): qty 0.4

## 2021-11-14 MED ORDER — LACTATED RINGERS IV SOLN: INTRAVENOUS | Status: DC

## 2021-11-14 MED ORDER — ONDANSETRON HCL 4 MG/2ML IJ SOLN
4.0000 mg | Freq: Four times a day (QID) | INTRAMUSCULAR | Status: DC | PRN
Start: 2021-11-14 — End: 2021-11-16

## 2021-11-14 MED ORDER — CEFTRIAXONE SODIUM 1 G IJ SOLR
1.0000 g | Freq: Once | INTRAMUSCULAR | Status: AC
  Administered 2021-11-14: 1 g via INTRAVENOUS
  Filled 2021-11-14: qty 10

## 2021-11-14 NOTE — ED Triage Notes (Signed)
Patient c/o LLQ abdominal pain and chills since 0300 today.   ?

## 2021-11-14 NOTE — ED Notes (Signed)
pt

## 2021-11-14 NOTE — ED Notes (Signed)
Patient transported to CT 

## 2021-11-14 NOTE — ED Provider Notes (Signed)
[facility] DEPT Provider Note   CSN: [id number] Arrival date & time: 11/14/21  1345     History  Chief Complaint  Patient presents with   Chills   Abdominal Pain    Benjamin Hensley is a 81 y.o. male with medical history significant for bladder cancer, hernia surgery on 09/22/21.  Patient reports to ED for evaluation of chills and left lower quadrant abdominal pain.  Patient states that since his hernia surgery on 1/13 he has had abdominal pain that waxes and wanes.  The patient states that last night at 3 AM he was awoken out of his sleep due to this abdominal pain.  The patient states that the pain is located in his left lower quadrant.  The patient describes the pain as a "nagging pain, I cannot get rid of it".  Patient states that the pain is alleviated when he takes exercise Tylenol however the pain always returns.  Patient reports that he has not had a bowel movement since last Saturday however he states that this is normal for him.  The patient denies any use of opiates.  Patient endorsing abdominal pain, constipation, chills.  The patient denies nausea, vomiting, diarrhea, fevers, blood in stool, dysuria, chest pain, shortness of breath.  Patient denies any history of A-fib, being placed on blood thinners.   Abdominal Pain Associated symptoms: chills and constipation   Associated symptoms: no chest pain, no diarrhea, no fever, no nausea, no shortness of breath and no vomiting       Home Medications Prior to Admission medications   Medication Sig Start Date End Date Taking? Authorizing Provider  acetaminophen (TYLENOL) 500 MG tablet Take 1,000 mg by mouth every 6 (six) hours as needed for moderate pain or headache.    [provider]  atorvastatin (LIPITOR) 10 MG tablet Take 10 mg by mouth daily. 02/17/21   [provider]  Multiple Vitamins-Minerals (EMERGEN-C VITAMIN C PO) Take 1 Package by mouth daily.    [provider]   oxyCODONE-acetaminophen (PERCOCET) 5-325 MG tablet Take 1 tablet by mouth every 6 (six) hours as needed for severe pain or moderate pain (post-operatively). 09/28/21 09/28/22  [name], MD  sertraline (ZOLOFT) 50 MG tablet Take 50 mg by mouth daily. 12/11/16   [provider]      Allergies    Patient has no known allergies.    Review of Systems   Review of Systems  Constitutional:  Positive for chills. Negative for fever.  Respiratory:  Negative for shortness of breath.   Cardiovascular:  Negative for chest pain.  Gastrointestinal:  Positive for abdominal pain and constipation. Negative for blood in stool, diarrhea, nausea and vomiting.  All other systems reviewed and are negative.  Physical Exam Updated Vital Signs BP 115/67    Pulse 88    Temp 98.6 F (37 C) (Oral)    Resp 18    Ht 5' 11.75" (1.822 m)    Wt 81.6 kg    SpO2 97%    BMI 24.58 kg/m  Physical Exam Vitals and nursing note reviewed.  Constitutional:      General: He is not in acute distress.    Appearance: He is well-developed. He is not ill-appearing, toxic-appearing or diaphoretic.  HENT:     Head: Normocephalic and atraumatic.  Cardiovascular:     Rate and Rhythm: Normal rate and regular rhythm.  Pulmonary:     Effort: Pulmonary effort is normal. No respiratory distress.  Breath sounds: Normal breath sounds. No stridor. No wheezing, rhonchi or rales.  Abdominal:     General: Abdomen is flat. A surgical scar is present. Bowel sounds are increased. There is no distension. There are no signs of injury.     Palpations: Abdomen is soft. There is no mass.     Tenderness: There is abdominal tenderness in the left lower quadrant.     Comments: Urostomy bag noted to patient abdomen, not draining or leaking. Urine in bag appears concentrated.   Skin:    General: Skin is warm and dry.     Capillary Refill: Capillary refill takes less than 2 seconds.  Neurological:     Mental Status: He is alert and  oriented to person, place, and time.    ED Results / Procedures / Treatments   Labs (all labs ordered are listed, but only abnormal results are displayed) Labs Reviewed  COMPREHENSIVE METABOLIC PANEL - Abnormal; Notable for the following components:      Result Value   Glucose, Bld 116 (*)    BUN 32 (*)    Creatinine, Ser 1.45 (*)    Calcium 8.7 (*)    GFR, Estimated 49 (*)    All other components within normal limits  URINALYSIS, ROUTINE W REFLEX MICROSCOPIC - Abnormal; Notable for the following components:   APPearance CLOUDY (*)    Hgb urine dipstick MODERATE (*)    Protein, ur 100 (*)    Nitrite POSITIVE (*)    Leukocytes,Ua LARGE (*)    WBC, UA >50 (*)    Bacteria, UA MANY (*)    All other components within normal limits  URINE CULTURE  CULTURE, BLOOD (ROUTINE X 2)  CULTURE, BLOOD (ROUTINE X 2)  LIPASE, BLOOD  CBC    EKG None  Radiology CT ABDOMEN PELVIS W CONTRAST  Result Date: 11/14/2021 CLINICAL DATA:  Left lower quadrant abdominal pain. EXAM: CT ABDOMEN AND PELVIS WITH CONTRAST TECHNIQUE: Multidetector CT imaging of the abdomen and pelvis was performed using the standard protocol following bolus administration of intravenous contrast. RADIATION DOSE REDUCTION: This exam was performed according to the departmental dose-optimization program which includes automated exposure control, adjustment of the mA and/or kV according to patient size and/or use of iterative reconstruction technique. CONTRAST:  145m OMNIPAQUE IOHEXOL 300 MG/ML  SOLN COMPARISON:  CT abdomen pelvis dated April 24, 2021. FINDINGS: Lower chest: No acute abnormality. Hepatobiliary: No focal liver abnormality is seen. No gallstones, gallbladder wall thickening, or biliary dilatation. Pancreas: Unremarkable. No pancreatic ductal dilatation or surrounding inflammatory changes. Spleen: Normal in size without focal abnormality. Unchanged calcified granulomas. Adrenals/Urinary Tract: Adrenal glands are  unremarkable. Interval cystectomy with ileal conduit. New mild bilateral hydroureteronephrosis. Prominent urothelial enhancement of the left renal pelvis and ureter with surrounding fat stranding. Ureteral stent within the ileal conduit and left ureter. No calculi. Stomach/Bowel: Unchanged small hiatal hernia. The stomach is otherwise within normal limits. No bowel wall thickening, distention, or surrounding inflammatory changes. Vascular/Lymphatic: Aortic atherosclerosis. No enlarged abdominal or pelvic lymph nodes. Reproductive: Interval prostatectomy. Other: No abdominal wall hernia or abnormality. No abdominopelvic ascites. No pneumoperitoneum. Musculoskeletal: No acute or significant osseous findings. IMPRESSION: 1. Interval cystectomy with ileal conduit. New mild bilateral hydroureteronephrosis with prominent urothelial enhancement of the left renal pelvis and ureter with surrounding inflammation, concerning for ascending urinary tract infection. 2. Aortic Atherosclerosis (ICD10-I70.0). Electronically Signed   By: WTitus DubinM.D.   On: 11/14/2021 16:19    Procedures Procedures    Medications  Ordered in ED Medications  cefTRIAXone (ROCEPHIN) 1 g in sodium chloride 0.9 % 100 mL IVPB (1 g Intravenous New Bag/Given 11/14/21 1702)  sodium chloride 0.9 % bolus 1,000 mL (1,000 mLs Intravenous New Bag/Given 11/14/21 1544)  iohexol (OMNIPAQUE) 300 MG/ML solution 100 mL (100 mLs Intravenous Contrast Given 11/14/21 1601)    ED Course/ Medical Decision Making/ A&P Clinical Course as of 11/14/21 1709  Tue Mar 07, 645  2531 81 year old male with a history of urostomy, placed 2 months ago, presenting to ED with lower abdominal pain and rigors and chills last night.  He initially presented with a heart rate of 100, afebrile, blood pressures been normal.  White blood cell count is 10.3.  UA was positive for leukocytes and nitrites and cloudy, with many bacteria.  CT scan of the abdomen is concerning for  bilateral hydronephrosis and possible ureteral inflammation.  Overall this clinical presentation could be consistent with complicated UTI, and also concerning for possible bacteremia with his rigors.  I felt the safest course of action would be to initiate IV antibiotics with Rocephin, send a urine culture, obtain blood cultures, and admit to observation for bacteremia rule out.  The patient and his wife are agreeable with this plan.  He also received some IV fluids for some elevation of BUN and creatinine [MT]    Clinical Course User Index [MT] Trifan, Carola Rhine, MD                           Medical Decision Making Amount and/or Complexity of Data Reviewed Labs: ordered. Radiology: ordered.   81 year old male presents to ED for evaluation of abdominal pain please see HPI for further details. On examination, patient is afebrile, not tachycardic however patient did present with tachycardia, not hypoxic, clear lung sounds bilaterally.  The patient has a soft compressible abdomen and endorses tenderness in the left lower quadrant.  Patient denies a history of diverticulosis.  Patient has recent surgical history on 1/13 that included an umbilical hernia repair as well as Crystoprostatectomy with node dissection and conduit diversion for aggressive variant bladder cancer, grade 3 prostate cancer.  Patient worked up utilizing the following labs and imaging studies interpreted by me personally: - CMP shows elevated creatinine 1.45, decreased GFR to 49.  Patient treated utilizing 1 L normal saline - Lipase unremarkable - CBC unremarkable, patient has slightly elevated white blood cell count of 10.3 - Urinalysis shows nitrites, leukocytes, protein, white blood cells. - CT abdomen pelvis does not show any signs of bowel obstruction, diverticulitis.  There is concern for a sending infection/inflammation of urinary tract concerning for pyelonephritis.  At this time, after discussing the patient and his  case with my attending Dr. Langston Masker, we feel that this patient should be admitted for IV antibiotics as well as sepsis rule out. Patient has been started on 1g of Rocephin. Have paged hospitalist for admission.   Dr. Darrick Meigs, [facility] hospitalist, has agreed to see and admit the patient.  The patient is stable at time of admission.   Final Clinical Impression(s) / ED Diagnoses Final diagnoses:  Urinary tract infection without hematuria, site unspecified  Rigors    Rx / DC Orders ED Discharge Orders     None         Azucena Cecil, PA-C 11/14/21 1710    Wyvonnia Dusky, MD 11/14/21 [id number]

## 2021-11-14 NOTE — ED Notes (Signed)
Pt NAD in bed, a/ox4. Pt c/o LUQ abd pain since sudden onset this AM at 0300. Pt states he took 2 tylenol and it went away, then came back at 1100. Pt denies pain on palp, n/v/d, flank pain, constipation, fever. -sick contacts or food borne illness.  ?

## 2021-11-14 NOTE — H&P (Signed)
TRH H&P    Patient Demographics:    Benjamin Hensley, is a 81 y.o. male  MRN: [id number]  DOB - 08-23-1941  Admit Date - 11/14/2021  Referring MD/NP/PA: Octaviano Glow  Outpatient Primary MD for the patient is Lavone Orn, MD  Patient coming from: Home  Chief complaint-left lower quadrant pain and chills   HPI:    Benjamin Hensley  is a 81 y.o. male, with medical history of bladder cancer, umbilical hernia, prostate cancer, s/p cystoprostatectomy with no dissection and conduit diversion and open umbilical hernia repair on 09/22/2021.  Patient had urostomy placed on 09/22/2021.  Presented with abdominal pain and left lower quadrant which has been going for a week, usually gets better with Tylenol but today patient says that pain did not get better.  He also developed rigors and chills this morning around 3 AM.  Patient came to the ED, CT scan of the abdomen was concerning for bilateral hydroureteronephrosis with prominent urothelial enhancement of the left renal pelvis and ureter with surrounding inflammation concerning for ascending urinary tract infection.  Patient also had abnormal UA.  Started on ceftriaxone for complicated UTI. Denies nausea or vomiting Denies chest pain or shortness of breath Denies dizziness, or passing out spell  In the ED patient started on ceftriaxone.    Review of systems:    In addition to the HPI above,    All other systems reviewed and are negative.    Past History of the following :    Past Medical History:  Diagnosis Date   Bladder cancer [facility])    Medical history non-contributory    Meningitis    hx of as a child affected right eye      Past Surgical History:  Procedure Laterality Date   ciliodional cyst  yrs ago   COLONOSCOPY WITH PROPOFOL N/A 02/26/2017   Procedure: COLONOSCOPY WITH PROPOFOL;  Surgeon: Garlan Fair, MD;  Location: [facility];  Service:  Endoscopy;  Laterality: N/A;   colonscopy  2008   HERNIA REPAIR     LYMPH NODE DISSECTION Bilateral 09/22/2021   Procedure: LYMPH NODE DISSECTION;  Surgeon: [name], MD;  Location: [facility];  Service: Urology;  Laterality: Bilateral;   ROBOT ASSISTED LAPAROSCOPIC COMPLETE CYSTECT ILEAL CONDUIT N/A 09/22/2021   Procedure: XI ROBOTIC ASSISTED LAPAROSCOPIC COMPLETE CYSTECT ILEAL CONDUIT WITH INDOCYANINE GREEN DYE;  Surgeon: [name], MD;  Location: [facility];  Service: Urology;  Laterality: N/A;  6 HRS   ROBOT ASSISTED LAPAROSCOPIC RADICAL PROSTATECTOMY N/A 09/22/2021   Procedure: XI ROBOTIC ASSISTED LAPAROSCOPIC RADICAL PROSTATECTOMY;  Surgeon: [name], MD;  Location: [facility];  Service: Urology;  Laterality: N/A;   TONSILLECTOMY  as child   TRANSURETHRAL RESECTION OF BLADDER TUMOR WITH MITOMYCIN-C N/A 05/02/2021   Procedure: TRANSURETHRAL RESECTION OF BLADDER TUMOR WITH GEMCITABINE;  Surgeon: Irine Seal, MD;  Location: [facility];  Service: Urology;  Laterality: N/A;  GENERAL ANESTHESIA WITH PARALYSIS   UMBILICAL HERNIA REPAIR N/A 09/22/2021   Procedure: OPEN HERNIA REPAIR UMBILICAL ADULT;  Surgeon: [name], MD;  Location:  [facility];  Service: Urology;  Laterality: N/A;      Social History:      Social History   Tobacco Use   Smoking status: Former    Packs/day: 1.00    Years: 30.00    Pack years: 30.00    Types: Cigarettes   Smokeless tobacco: Never  Substance Use Topics   Alcohol use: Not Currently    Comment: occ       Family History :   Patient's sister diagnosed with lung cancer   Home Medications:   Prior to Admission medications   Medication Sig Start Date End Date Taking? Authorizing Provider  acetaminophen (TYLENOL) 500 MG tablet Take 1,000 mg by mouth every 6 (six) hours as needed for moderate pain or headache.   Yes [provider]  atorvastatin (LIPITOR) 10 MG tablet Take 10 mg by mouth daily. 02/17/21  Yes [provider]   sertraline (ZOLOFT) 50 MG tablet Take 50 mg by mouth daily. 12/11/16  Yes [provider]  oxyCODONE-acetaminophen (PERCOCET) 5-325 MG tablet Take 1 tablet by mouth every 6 (six) hours as needed for severe pain or moderate pain (post-operatively). 09/28/21 09/28/22  [name], MD     Allergies:    No Known Allergies   Physical Exam:   Vitals  Blood pressure 115/67, pulse 88, temperature 98.6 F (37 C), temperature source Oral, resp. rate 18, height 5' 11.75" (1.822 m), weight 81.6 kg, SpO2 97 %.  1.  General: Appears in no acute distress  2. Psychiatric: Alert, oriented x3, intact insight and judgment  3. Neurologic: Cranial nerves II through XII grossly intact, no focal deficit noted  4. HEENMT:  Atraumatic normocephalic, extraocular muscles are intact  5. Respiratory : Clear to auscultation bilaterally, no wheezing or crackles auscultated  6. Cardiovascular : S1-S2, regular, no murmur auscultated  7. Gastrointestinal:  Abdominal soft, nontender, no organomegaly, no CVA tenderness   8. Skin:  No rashes noted on the skin  9.Musculoskeletal:  No edema in the lower extremities    Data Review:    CBC Recent Labs  Lab 11/14/21 1355  WBC 10.3  HGB 13.1  HCT 40.9  PLT 160  MCV 84.3  MCH 27.0  MCHC 32.0  RDW 13.4   ------------------------------------------------------------------------------------------------------------------  Results for orders placed or performed during the hospital encounter of 11/14/21 (from the past 48 hour(s))  Lipase, blood     Status: None   Collection Time: 11/14/21  1:55 PM  Result Value Ref Range   Lipase 38 11 - 51 U/L    Comment: Performed at [facility], [place] [address] [place], [place] [address]  Comprehensive metabolic panel     Status: Abnormal   Collection Time: 11/14/21  1:55 PM  Result Value Ref Range   Sodium 138 135 - 145 mmol/L   Potassium 4.2 3.5 - 5.1 mmol/L   Chloride 107 98 -  111 mmol/L   CO2 22 22 - 32 mmol/L   Glucose, Bld 116 (H) 70 - 99 mg/dL    Comment: Glucose reference range applies only to samples taken after fasting for at least 8 hours.   BUN 32 (H) 8 - 23 mg/dL   Creatinine, Ser 1.45 (H) 0.61 - 1.24 mg/dL   Calcium 8.7 (L) 8.9 - 10.3 mg/dL   Total Protein 7.2 6.5 - 8.1 g/dL   Albumin 3.6 3.5 - 5.0 g/dL   AST 20 15 - 41 U/L   ALT 20 0 - 44  U/L   Alkaline Phosphatase 58 38 - 126 U/L   Total Bilirubin 0.7 0.3 - 1.2 mg/dL   GFR, Estimated 49 (L) >60 mL/min    Comment: (NOTE) Calculated using the CKD-EPI Creatinine Equation (2021)    Anion gap 9 5 - 15    Comment: Performed at [facility], [place] [address], [place], [place] [address]  CBC     Status: None   Collection Time: 11/14/21  1:55 PM  Result Value Ref Range   WBC 10.3 4.0 - 10.5 K/uL   RBC 4.85 4.22 - 5.81 MIL/uL   Hemoglobin 13.1 13.0 - 17.0 g/dL   HCT 40.9 39.0 - 52.0 %   MCV 84.3 80.0 - 100.0 fL   MCH 27.0 26.0 - 34.0 pg   MCHC 32.0 30.0 - 36.0 g/dL   RDW 13.4 11.5 - 15.5 %   Platelets 160 150 - 400 K/uL   nRBC 0.0 0.0 - 0.2 %    Comment: Performed at [facility], [place] [address] [place], [place] [address]  Urinalysis, Routine w reflex microscopic     Status: Abnormal   Collection Time: 11/14/21  3:09 PM  Result Value Ref Range   Color, Urine YELLOW YELLOW   APPearance CLOUDY (A) CLEAR   Specific Gravity, Urine 1.014 1.005 - 1.030   pH 5.0 5.0 - 8.0   Glucose, UA NEGATIVE NEGATIVE mg/dL   Hgb urine dipstick MODERATE (A) NEGATIVE   Bilirubin Urine NEGATIVE NEGATIVE   Ketones, ur NEGATIVE NEGATIVE mg/dL   Protein, ur 100 (A) NEGATIVE mg/dL   Nitrite POSITIVE (A) NEGATIVE   Leukocytes,Ua LARGE (A) NEGATIVE   RBC / HPF 21-50 0 - 5 RBC/hpf   WBC, UA >50 (H) 0 - 5 WBC/hpf   Bacteria, UA MANY (A) NONE SEEN   Mucus PRESENT     Comment: Performed at [facility], [place] [address], [place], [place] [address]    Chemistries   Recent Labs  Lab 11/14/21 1355  NA 138  K 4.2  CL 107  CO2 22  GLUCOSE 116*  BUN 32*  CREATININE 1.45*  CALCIUM 8.7*  AST 20  ALT 20  ALKPHOS 58  BILITOT 0.7   ------------------------------------------------------------------------------------------------------------------  ------------------------------------------------------------------------------------------------------------------ GFR: Estimated Creatinine Clearance: 45.7 mL/min (A) (by C-G formula based on SCr of 1.45 mg/dL (H)). Liver Function Tests: Recent Labs  Lab 11/14/21 1355  AST 20  ALT 20  ALKPHOS 58  BILITOT 0.7  PROT 7.2  ALBUMIN 3.6   Recent Labs  Lab 11/14/21 1355  LIPASE 38   No results for input(s): AMMONIA in the last 168 hours. Coagulation Profile: No results for input(s): INR, PROTIME in the last 168 hours. Cardiac Enzymes: No results for input(s): CKTOTAL, CKMB, CKMBINDEX, TROPONINI in the last 168 hours. BNP (last 3 results) No results for input(s): PROBNP in the last 8760 hours. HbA1C: No results for input(s): HGBA1C in the last 72 hours. CBG: No results for input(s): GLUCAP in the last 168 hours. Lipid Profile: No results for input(s): CHOL, HDL, LDLCALC, TRIG, CHOLHDL, LDLDIRECT in the last 72 hours. Thyroid Function Tests: No results for input(s): TSH, T4TOTAL, FREET4, T3FREE, THYROIDAB in the last 72 hours. Anemia Panel: No results for input(s): VITAMINB12, FOLATE, FERRITIN, TIBC, IRON, RETICCTPCT in the last 72 hours.  --------------------------------------------------------------------------------------------------------------- Urine analysis:    Component Value Date/Time   COLORURINE YELLOW 11/14/2021 1509   APPEARANCEUR CLOUDY (A) 11/14/2021 1509   LABSPEC 1.014 11/14/2021 1509  PHURINE 5.0 11/14/2021 1509   GLUCOSEU NEGATIVE 11/14/2021 1509   HGBUR MODERATE (A) 11/14/2021 1509   BILIRUBINUR NEGATIVE 11/14/2021 1509   KETONESUR NEGATIVE 11/14/2021 1509    PROTEINUR 100 (A) 11/14/2021 1509   NITRITE POSITIVE (A) 11/14/2021 1509   LEUKOCYTESUR LARGE (A) 11/14/2021 1509      Imaging Results:    CT ABDOMEN PELVIS W CONTRAST  Result Date: 11/14/2021 CLINICAL DATA:  Left lower quadrant abdominal pain. EXAM: CT ABDOMEN AND PELVIS WITH CONTRAST TECHNIQUE: Multidetector CT imaging of the abdomen and pelvis was performed using the standard protocol following bolus administration of intravenous contrast. RADIATION DOSE REDUCTION: This exam was performed according to the departmental dose-optimization program which includes automated exposure control, adjustment of the mA and/or kV according to patient size and/or use of iterative reconstruction technique. CONTRAST:  150m OMNIPAQUE IOHEXOL 300 MG/ML  SOLN COMPARISON:  CT abdomen pelvis dated April 24, 2021. FINDINGS: Lower chest: No acute abnormality. Hepatobiliary: No focal liver abnormality is seen. No gallstones, gallbladder wall thickening, or biliary dilatation. Pancreas: Unremarkable. No pancreatic ductal dilatation or surrounding inflammatory changes. Spleen: Normal in size without focal abnormality. Unchanged calcified granulomas. Adrenals/Urinary Tract: Adrenal glands are unremarkable. Interval cystectomy with ileal conduit. New mild bilateral hydroureteronephrosis. Prominent urothelial enhancement of the left renal pelvis and ureter with surrounding fat stranding. Ureteral stent within the ileal conduit and left ureter. No calculi. Stomach/Bowel: Unchanged small hiatal hernia. The stomach is otherwise within normal limits. No bowel wall thickening, distention, or surrounding inflammatory changes. Vascular/Lymphatic: Aortic atherosclerosis. No enlarged abdominal or pelvic lymph nodes. Reproductive: Interval prostatectomy. Other: No abdominal wall hernia or abnormality. No abdominopelvic ascites. No pneumoperitoneum. Musculoskeletal: No acute or significant osseous findings. IMPRESSION: 1. Interval cystectomy  with ileal conduit. New mild bilateral hydroureteronephrosis with prominent urothelial enhancement of the left renal pelvis and ureter with surrounding inflammation, concerning for ascending urinary tract infection. 2. Aortic Atherosclerosis (ICD10-I70.0). Electronically Signed   By: WTitus DubinM.D.   On: 11/14/2021 16:19       Assessment & Plan:    Principal Problem:   Complicated UTI (urinary tract infection)   Complicated UTI-patient had complicated surgery on 10/17/5102now presenting with left lower quadrant pain, chills and shivering concerning for bacteremia.  CT abdomen/pelvis shows ascending inflammation and left hydroureter/renal pelvis.  Patient started on IV ceftriaxone.  Follow urine and blood culture results.  I have consulted urology, called and discussed Dr. BShelda Palwill see patient in a.m. Hyperlipidemia-continue Lipitor    DVT Prophylaxis-   Lovenox   AM Labs Ordered, also please review Full Orders  Family Communication: Admission, patients condition and plan of care including tests being ordered have been discussed with the patient and  who indicate understanding and agree with the plan and Code Status.  Code Status: Full code  Admission status: Inpatient :The appropriate admission status for this patient is INPATIENT. Inpatient status is judged to be reasonable and necessary in order to provide the required intensity of service to ensure the patient's safety. The patient's presenting symptoms, physical exam findings, and initial radiographic and laboratory data in the context of their chronic comorbidities is felt to place them at high risk for further clinical deterioration. Furthermore, it is not anticipated that the patient will be medically stable for discharge from the hospital within 2 midnights of admission. The following factors support the admission status of inpatient.    * I certify that at the point of admission it is my clinical judgment that the patient  will require inpatient hospital care spanning beyond 2 midnights from the point of admission due to high intensity of service, high risk for further deterioration and high frequency of surveillance required.*  Time spent in minutes : 60 minutes   Athalee Esterline S Nikoloz Huy M.D

## 2021-11-15 DIAGNOSIS — C679 Malignant neoplasm of bladder, unspecified: Secondary | ICD-10-CM

## 2021-11-15 LAB — CBC
HCT: 38.1 % — ABNORMAL LOW (ref 39.0–52.0)
Hemoglobin: 12.1 g/dL — ABNORMAL LOW (ref 13.0–17.0)
MCH: 27.4 pg (ref 26.0–34.0)
MCHC: 31.8 g/dL (ref 30.0–36.0)
MCV: 86.2 fL (ref 80.0–100.0)
Platelets: 144 10*3/uL — ABNORMAL LOW (ref 150–400)
RBC: 4.42 MIL/uL (ref 4.22–5.81)
RDW: 13.4 % (ref 11.5–15.5)
WBC: 8 10*3/uL (ref 4.0–10.5)
nRBC: 0 % (ref 0.0–0.2)

## 2021-11-15 LAB — COMPREHENSIVE METABOLIC PANEL
ALT: 16 U/L (ref 0–44)
AST: 16 U/L (ref 15–41)
Albumin: 3.2 g/dL — ABNORMAL LOW (ref 3.5–5.0)
Alkaline Phosphatase: 51 U/L (ref 38–126)
Anion gap: 9 (ref 5–15)
BUN: 25 mg/dL — ABNORMAL HIGH (ref 8–23)
CO2: 22 mmol/L (ref 22–32)
Calcium: 8.5 mg/dL — ABNORMAL LOW (ref 8.9–10.3)
Chloride: 106 mmol/L (ref 98–111)
Creatinine, Ser: 1.41 mg/dL — ABNORMAL HIGH (ref 0.61–1.24)
GFR, Estimated: 51 mL/min — ABNORMAL LOW (ref >60)
Glucose, Bld: 117 mg/dL — ABNORMAL HIGH (ref 70–99)
Potassium: 4.1 mmol/L (ref 3.5–5.1)
Sodium: 137 mmol/L (ref 135–145)
Total Bilirubin: 0.5 mg/dL (ref 0.3–1.2)
Total Protein: 6.6 g/dL (ref 6.5–8.1)

## 2021-11-15 MED ORDER — OXYCODONE-ACETAMINOPHEN 5-325 MG PO TABS: 1.0000 | ORAL_TABLET | Freq: Once | ORAL | Status: DC | PRN

## 2021-11-15 MED ORDER — OXYCODONE-ACETAMINOPHEN 5-325 MG PO TABS
1.0000 | ORAL_TABLET | Freq: Once | ORAL | Status: AC | PRN
  Administered 2021-11-15: 1 via ORAL
  Filled 2021-11-15: qty 1

## 2021-11-15 MED ORDER — TRAMADOL HCL 50 MG PO TABS
50.0000 mg | ORAL_TABLET | Freq: Once | ORAL | Status: AC | PRN
  Administered 2021-11-15: 50 mg via ORAL
  Filled 2021-11-15: qty 1

## 2021-11-15 MED ORDER — CIPROFLOXACIN IN D5W 400 MG/200ML IV SOLN
400.0000 mg | Freq: Two times a day (BID) | INTRAVENOUS | Status: DC
  Administered 2021-11-15 – 2021-11-16 (×3): 400 mg via INTRAVENOUS
  Filled 2021-11-15 (×2): qty 200

## 2021-11-15 NOTE — Hospital Course (Signed)
Mr. Tirado is a 81 y.o. M with T1G3 urothelial carcinoma s/p TURBT 2022 and recent cystoprostatectomy with ureteral conduit diversion 1 month ago in Jan 2023 who presented with LLQ pain and chills for several days, now severe. ? ?In the ER, CT showed bilateral hydroureter and enhancement consistent with ascending UTI. Started on Rocephin and Urology consulted. ?

## 2021-11-15 NOTE — Progress Notes (Signed)
?  Progress Note ? ? ?Patient: Benjamin Hensley VQX:[id number] DOB: Apr 03, 1941 DOA: 11/14/2021     1 ?DOS: the patient was seen and examined on 11/15/2021 ?  ? ? ? ? ?Brief hospital course: ?Benjamin Hensley is a 81 y.o. M with T1G3 urothelial carcinoma s/p TURBT 2022 and recent cystoprostatectomy with ureteral conduit diversion 1 month ago in Jan 2023 who presented with LLQ pain and chills for several days, now severe. ? ?In the ER, CT showed bilateral hydroureter and enhancement consistent with ascending UTI. Started on Rocephin and Urology consulted. ? ? ? ? ? ?Assessment and Plan: ?* Complicated UTI (urinary tract infection) ?Urine culture from clinic growing Rocephin and Enterobacter. ?- Stop Rocephin ?- Start Levaquin IV ? ?Bladder cancer ([place]) ?Urology reviewed CT, no indication for treatment of hydronephrosis which is expected at this time ?- Consult urology, appreciate cares ? ? ? ? ?  ? ?Subjective: Patient is extremely sleepy, weak, barely able to get up no further fever, confusion, vomiting. ? ?Physical Exam: ?Vitals:  ? 11/14/21 2341 11/15/21 0529 11/15/21 0804 11/15/21 1420  ?BP: (!) 123/50 126/60 120/75 (!) 122/59  ?Pulse: 64 80 70 80  ?Resp: '16 18 18 16  '$ ?Temp: 98 ?F (36.7 ?C) 98.2 ?F (36.8 ?C) 98.3 ?F (36.8 ?C) 99.1 ?F (37.3 ?C)  ?TempSrc: Oral Oral Oral Oral  ?SpO2: 98% 96% 96% 93%  ?Weight:      ?Height:      ? ?Elderly adult male, sitting in recliner, appears debilitated and weak. ?RRR, no murmurs, no lower extremity edema ?Normal respiratory rate and rhythm, lungs clear without rales or wheezes ?Abdomen soft, mild nonfocal tenderness to palpation, urostomy in place ?Attention diminished, affect blunted ? ? ? ? ?Data Reviewed: ?Urology notes reviewed, CT imaging, basic metabolic panel and complete blood counts reviewed.  Discussed with urology. ? ?Family Communication: wife by phone ? ?Disposition: ?Status is: Inpatient ?Remains inpatient appropriate because: he is still quite weak and tired and somnolent  will keep for further IV antibiotics ? Planned Discharge Destination: Home ? ? ? ?  ? ? ? ?Author: ?Edwin Dada, MD ?11/15/2021 3:48 PM ? ?For on call review [email or web address].  ?

## 2021-11-15 NOTE — Assessment & Plan Note (Signed)
Urine culture from clinic growing Rocephin and Enterobacter. ?- Stop Rocephin ?- Start Levaquin IV ?

## 2021-11-15 NOTE — Assessment & Plan Note (Addendum)
Urology reviewed CT, no indication for treatment of hydronephrosis which is expected at this time ?- Consult urology, appreciate cares ?

## 2021-11-15 NOTE — Consult Note (Signed)
H&P Physician requesting consult: Eleonore Chiquito  Chief Complaint: UTI  History of Present Illness: 81 year old male status post cystoprostatectomy with node dissection and conduit diversion and umbilical hernia repair 7/98/9211 for an aggressive bladder cancer and prostate cancer.  He presented with abdominal pain and chills.  He has been afebrile with vital signs stable.  No leukocytosis.  Creatinine 1.41 which is similar to a value about 1 month ago.  Patient had a urine culture from 10/16/2021 that showed Enterobacter sensitive to ciprofloxacin and cefepime.  Results are as follows from February culture:    Source       : URINE CULTURE  Iso/Result : 01     Enterobacter cloacae  Antimicrobic/Dose                        MIC   SYSTEMIC   URINE  ---------------------------------------------------------------  Amp/Sulbactam                          >16/8       R        R  Amikacin                                 <16       S        S  Ampicillin                               >16       R        R  Amox/K Clav                            >16/8       R        R  Aztreonam                                 16       I        I  Ceftriaxone                              >32       R        R  Ceftazidime                              >16       R        R  Ceftazidime/K Clav                        >2                   Cefotaxime                               >32       R        R  Cefotaxime/K Clavu                        >  4                   Cefotaxime-ESBL                           >1                   Cefazolin                                >16       R        R  Ciprofloxacin                             <1       S        S  Cefepime                                  <2       S        S  Cefotetan                                >32       R        R  Ertapenem                               <0.5       S        S  Nitrofurantoin                           <32                S  Gentamicin                                 >8       R        R  Levofloxacin                              <2       S        S  Meropenem                                 <1       S        S  Minocycline                               <4       S        S  Pip/Tazo                                 <16       S        S  Trimeth/Sulfa                          >  2/38       R        R  Tetracycline                              >8       R        R  Tobramycin                                <4       S        S   Today, patient's symptoms have resolved.  He is feeling much improved.  He states he is having good bowel movements.  Urostomy has been working well.  No longer having pain or chills.  Currently on ceftriaxone.  He had a CT scan performed that showed new mild bilateral hydroureteronephrosis with prominent urothelial enhancement of the left renal pelvis and ureter with surrounding inflammation concerning for ascending UTI.    Past Medical History:  Diagnosis Date   Bladder cancer [facility])    Medical history non-contributory    Meningitis    hx of as a child affected right eye   Past Surgical History:  Procedure Laterality Date   ciliodional cyst  yrs ago   COLONOSCOPY WITH PROPOFOL N/A 02/26/2017   Procedure: COLONOSCOPY WITH PROPOFOL;  Surgeon: Garlan Fair, MD;  Location: [facility];  Service: Endoscopy;  Laterality: N/A;   colonscopy  2008   HERNIA REPAIR     LYMPH NODE DISSECTION Bilateral 09/22/2021   Procedure: LYMPH NODE DISSECTION;  Surgeon: [name], MD;  Location: [facility];  Service: Urology;  Laterality: Bilateral;   ROBOT ASSISTED LAPAROSCOPIC COMPLETE CYSTECT ILEAL CONDUIT N/A 09/22/2021   Procedure: XI ROBOTIC ASSISTED LAPAROSCOPIC COMPLETE CYSTECT ILEAL CONDUIT WITH INDOCYANINE GREEN DYE;  Surgeon: [name], MD;  Location: [facility];  Service: Urology;  Laterality: N/A;  6 HRS   ROBOT ASSISTED LAPAROSCOPIC RADICAL PROSTATECTOMY N/A 09/22/2021   Procedure: XI ROBOTIC  ASSISTED LAPAROSCOPIC RADICAL PROSTATECTOMY;  Surgeon: [name], MD;  Location: [facility];  Service: Urology;  Laterality: N/A;   TONSILLECTOMY  as child   TRANSURETHRAL RESECTION OF BLADDER TUMOR WITH MITOMYCIN-C N/A 05/02/2021   Procedure: TRANSURETHRAL RESECTION OF BLADDER TUMOR WITH GEMCITABINE;  Surgeon: Irine Seal, MD;  Location: [facility];  Service: Urology;  Laterality: N/A;  GENERAL ANESTHESIA WITH PARALYSIS   UMBILICAL HERNIA REPAIR N/A 09/22/2021   Procedure: OPEN HERNIA REPAIR UMBILICAL ADULT;  Surgeon: [name], MD;  Location: [facility];  Service: Urology;  Laterality: N/A;    Home Medications:  Medications Prior to Admission  Medication Sig Dispense Refill Last Dose   acetaminophen (TYLENOL) 500 MG tablet Take 1,000 mg by mouth every 6 (six) hours as needed for moderate pain or headache.   11/14/2021   atorvastatin (LIPITOR) 10 MG tablet Take 10 mg by mouth daily.   11/14/2021   sertraline (ZOLOFT) 50 MG tablet Take 50 mg by mouth daily.  3 11/14/2021   oxyCODONE-acetaminophen (PERCOCET) 5-325 MG tablet Take 1 tablet by mouth every 6 (six) hours as needed for severe pain or moderate pain (post-operatively). 15 tablet 0    Allergies: No Known Allergies  History reviewed. No pertinent family history. Social History:  reports that he has quit smoking. His smoking use included cigarettes. He has a 30.00 pack-year smoking history. He has never used smokeless tobacco.  He reports that he does not currently use alcohol. He reports that he does not use drugs.  ROS: A complete review of systems was performed.  All systems are negative except for pertinent findings as noted. ROS   Physical Exam:  Vital signs in last 24 hours: Temp:  [98 F (36.7 C)-98.6 F (37 C)] 98.2 F (36.8 C) (03/08 0529) Pulse Rate:  [64-100] 80 (03/08 0529) Resp:  [16-19] 18 (03/08 0529) BP: (102-134)/(50-70) 126/60 (03/08 0529) SpO2:  [95 %-98 %] 96 % (03/08 0529) Weight:  [81.6 kg-83.6 kg] 83.6 kg (03/07  2130) General:  Alert and oriented, No acute distress HEENT: Normocephalic, atraumatic Neck: No JVD or lymphadenopathy Cardiovascular: Regular rate and rhythm Lungs: Regular rate and effort Abdomen: Soft, nontender, nondistended, no abdominal masses, conduit pink patent and productive of clear yellow urine.  Incision without hernia.  Bander stent visible out of conduit Back: No CVA tenderness Extremities: No edema Neurologic: Grossly intact  Laboratory Data:  Results for orders placed or performed during the hospital encounter of 11/14/21 (from the past 24 hour(s))  Lipase, blood     Status: None   Collection Time: 11/14/21  1:55 PM  Result Value Ref Range   Lipase 38 11 - 51 U/L  Comprehensive metabolic panel     Status: Abnormal   Collection Time: 11/14/21  1:55 PM  Result Value Ref Range   Sodium 138 135 - 145 mmol/L   Potassium 4.2 3.5 - 5.1 mmol/L   Chloride 107 98 - 111 mmol/L   CO2 22 22 - 32 mmol/L   Glucose, Bld 116 (H) 70 - 99 mg/dL   BUN 32 (H) 8 - 23 mg/dL   Creatinine, Ser 1.45 (H) 0.61 - 1.24 mg/dL   Calcium 8.7 (L) 8.9 - 10.3 mg/dL   Total Protein 7.2 6.5 - 8.1 g/dL   Albumin 3.6 3.5 - 5.0 g/dL   AST 20 15 - 41 U/L   ALT 20 0 - 44 U/L   Alkaline Phosphatase 58 38 - 126 U/L   Total Bilirubin 0.7 0.3 - 1.2 mg/dL   GFR, Estimated 49 (L) >60 mL/min   Anion gap 9 5 - 15  CBC     Status: None   Collection Time: 11/14/21  1:55 PM  Result Value Ref Range   WBC 10.3 4.0 - 10.5 K/uL   RBC 4.85 4.22 - 5.81 MIL/uL   Hemoglobin 13.1 13.0 - 17.0 g/dL   HCT 40.9 39.0 - 52.0 %   MCV 84.3 80.0 - 100.0 fL   MCH 27.0 26.0 - 34.0 pg   MCHC 32.0 30.0 - 36.0 g/dL   RDW 13.4 11.5 - 15.5 %   Platelets 160 150 - 400 K/uL   nRBC 0.0 0.0 - 0.2 %  Urinalysis, Routine w reflex microscopic     Status: Abnormal   Collection Time: 11/14/21  3:09 PM  Result Value Ref Range   Color, Urine YELLOW YELLOW   APPearance CLOUDY (A) CLEAR   Specific Gravity, Urine 1.014 1.005 - 1.030    pH 5.0 5.0 - 8.0   Glucose, UA NEGATIVE NEGATIVE mg/dL   Hgb urine dipstick MODERATE (A) NEGATIVE   Bilirubin Urine NEGATIVE NEGATIVE   Ketones, ur NEGATIVE NEGATIVE mg/dL   Protein, ur 100 (A) NEGATIVE mg/dL   Nitrite POSITIVE (A) NEGATIVE   Leukocytes,Ua LARGE (A) NEGATIVE   RBC / HPF 21-50 0 - 5 RBC/hpf   WBC, UA >50 (H) 0 - 5 WBC/hpf  Bacteria, UA MANY (A) NONE SEEN   Mucus PRESENT   Resp Panel by RT-PCR (Flu A&B, Covid) Nasopharyngeal Swab     Status: None   Collection Time: 11/14/21  6:08 PM   Specimen: Nasopharyngeal Swab; Nasopharyngeal(NP) swabs in vial transport medium  Result Value Ref Range   SARS Coronavirus 2 by RT PCR NEGATIVE NEGATIVE   Influenza A by PCR NEGATIVE NEGATIVE   Influenza B by PCR NEGATIVE NEGATIVE  CBC     Status: Abnormal   Collection Time: 11/15/21  5:49 AM  Result Value Ref Range   WBC 8.0 4.0 - 10.5 K/uL   RBC 4.42 4.22 - 5.81 MIL/uL   Hemoglobin 12.1 (L) 13.0 - 17.0 g/dL   HCT 38.1 (L) 39.0 - 52.0 %   MCV 86.2 80.0 - 100.0 fL   MCH 27.4 26.0 - 34.0 pg   MCHC 31.8 30.0 - 36.0 g/dL   RDW 13.4 11.5 - 15.5 %   Platelets 144 (L) 150 - 400 K/uL   nRBC 0.0 0.0 - 0.2 %  Comprehensive metabolic panel     Status: Abnormal   Collection Time: 11/15/21  5:49 AM  Result Value Ref Range   Sodium 137 135 - 145 mmol/L   Potassium 4.1 3.5 - 5.1 mmol/L   Chloride 106 98 - 111 mmol/L   CO2 22 22 - 32 mmol/L   Glucose, Bld 117 (H) 70 - 99 mg/dL   BUN 25 (H) 8 - 23 mg/dL   Creatinine, Ser 1.41 (H) 0.61 - 1.24 mg/dL   Calcium 8.5 (L) 8.9 - 10.3 mg/dL   Total Protein 6.6 6.5 - 8.1 g/dL   Albumin 3.2 (L) 3.5 - 5.0 g/dL   AST 16 15 - 41 U/L   ALT 16 0 - 44 U/L   Alkaline Phosphatase 51 38 - 126 U/L   Total Bilirubin 0.5 0.3 - 1.2 mg/dL   GFR, Estimated 51 (L) >60 mL/min   Anion gap 9 5 - 15   Recent Results (from the past 240 hour(s))  Resp Panel by RT-PCR (Flu A&B, Covid) Nasopharyngeal Swab     Status: None   Collection Time: 11/14/21  6:08 PM    Specimen: Nasopharyngeal Swab; Nasopharyngeal(NP) swabs in vial transport medium  Result Value Ref Range Status   SARS Coronavirus 2 by RT PCR NEGATIVE NEGATIVE Final    Comment: (NOTE) SARS-CoV-2 target nucleic acids are NOT DETECTED.  The SARS-CoV-2 RNA is generally detectable in upper respiratory specimens during the acute phase of infection. The lowest concentration of SARS-CoV-2 viral copies this assay can detect is 138 copies/mL. A negative result does not preclude SARS-Cov-2 infection and should not be used as the sole basis for treatment or other patient management decisions. A negative result may occur with  improper specimen collection/handling, submission of specimen other than nasopharyngeal swab, presence of viral mutation(s) within the areas targeted by this assay, and inadequate number of viral copies(<138 copies/mL). A negative result must be combined with clinical observations, patient history, and epidemiological information. The expected result is Negative.  Fact Sheet for Patients:  EntrepreneurPulse.com.au  Fact Sheet for Healthcare Providers:  IncredibleEmployment.be  This test is no t yet approved or cleared by the [place] FDA and  has been authorized for detection and/or diagnosis of SARS-CoV-2 by FDA under an Emergency Use Authorization (EUA). This EUA will remain  in effect (meaning this test can be used) for the duration of the COVID-19 declaration under Section 564(b)(1) of the Act,  21 U.S.C.section 360bbb-3(b)(1), unless the authorization is terminated  or revoked sooner.       Influenza A by PCR NEGATIVE NEGATIVE Final   Influenza B by PCR NEGATIVE NEGATIVE Final    Comment: (NOTE) The Xpert Xpress SARS-CoV-2/FLU/RSV plus assay is intended as an aid in the diagnosis of influenza from Nasopharyngeal swab specimens and should not be used as a sole basis for treatment. Nasal washings and aspirates are  unacceptable for Xpert Xpress SARS-CoV-2/FLU/RSV testing.  Fact Sheet for Patients: EntrepreneurPulse.com.au  Fact Sheet for Healthcare Providers: IncredibleEmployment.be  This test is not yet approved or cleared by the [place] FDA and has been authorized for detection and/or diagnosis of SARS-CoV-2 by FDA under an Emergency Use Authorization (EUA). This EUA will remain in effect (meaning this test can be used) for the duration of the COVID-19 declaration under Section 564(b)(1) of the Act, 21 U.S.C. section 360bbb-3(b)(1), unless the authorization is terminated or revoked.  Performed at [facility], [place] [address] [place], [place] [address]    Creatinine: Recent Labs    11/14/21 1355 11/15/21 0549  CREATININE 1.45* 1.41*   CT scan personally reviewed and is detailed in the history of present illness.  Looks like the left ureteral stent has migrated down.  Impression/Assessment:  Urinary tract infection Bilateral hydronephrosis   Plan:  Recommend switching antibiotics to ciprofloxacin based on prior culture.  I will discuss with Dr. Tresa Moore about his migrated stent and see if we need to go ahead and pull it.  Looks like he is clinically improving.  Can consider discharge on ciprofloxacin today versus observe another day to await culture results.  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  Can observe this for now.  Marton Redwood, III 11/15/2021, 8:01 AM

## 2021-11-15 NOTE — Progress Notes (Signed)
?  Transition of Care (TOC) Screening Note ? ? ?Patient Details  ?Name: Benjamin Hensley ?Date of Birth: 07/19/1941 ? ? ?Transition of Care (TOC) CM/SW Contact:    ?Frederic Tones, Marjie Skiff, RN ?Phone Number: ?11/15/2021, 1:41 PM ? ? ? ?Transition of Care Department [facility]) has reviewed patient and no TOC needs have been identified at this time. We will continue to monitor patient advancement through interdisciplinary progression rounds. If new patient transition needs arise, please place a TOC consult. ?  ?

## 2021-11-16 LAB — URINE CULTURE: Culture: >=100000 — AB

## 2021-11-16 MED ORDER — ONDANSETRON HCL 4 MG PO TABS: 4.0000 mg | ORAL_TABLET | Freq: Four times a day (QID) | ORAL | 1 refills | Status: DC | PRN

## 2021-11-16 MED ORDER — CEPHALEXIN 250 MG PO CAPS: 250.0000 mg | ORAL_CAPSULE | Freq: Four times a day (QID) | ORAL | 0 refills | Status: DC

## 2021-11-16 NOTE — Discharge Summary (Signed)
Physician Discharge Summary   Patient: Benjamin Hensley MRN: [id number] DOB: 05-28-1941  Admit date:     11/14/2021  Discharge date: 11/16/21  Discharge Physician: Edwin Dada   PCP: Lavone Orn, MD   Recommendations at discharge:  Follow up with Dr. Tresa Moore in 1 week      Discharge Diagnoses: Principal Problem:   Complicated UTI (urinary tract infection) Active Problems:   Bladder cancer [facility])      Hospital Course: [name] is a 81 y.o. M with T1G3 urothelial carcinoma s/p TURBT 2022 and recent cystoprostatectomy with ureteral conduit diversion 1 month ago in Jan 2023 who presented with LLQ pain and chills for several days, now severe.  In the ER, CT showed bilateral hydroureter and enhancement consistent with ascending UTI. Started on Rocephin and Urology consulted.  Patient's urine cultures here grew Klebsiella and E. coli, both susceptible to cefazolin.  He was treated with Rocephin and Levaquin, discharged on cephalexin to complete 10 days.  Case discussed with Urology, who managed his recent ureteral stents and have arranged for close follow up.  On the day of discharge, the patient was mentating well, ambulating well, had normal vital signs, and normal p.o. intake          Pain control - [place] Controlled Substance Reporting System database was reviewed.     Consultants: Urology   Disposition: Home   DISCHARGE MEDICATION: Allergies as of 11/16/2021   No Known Allergies      Medication List     TAKE these medications    acetaminophen 500 MG tablet Commonly known as: TYLENOL Take 1,000 mg by mouth every 6 (six) hours as needed for moderate pain or headache.   atorvastatin 10 MG tablet Commonly known as: LIPITOR Take 10 mg by mouth daily.   cephALEXin 250 MG capsule Commonly known as: KEFLEX Take 1 capsule (250 mg total) by mouth 4 (four) times daily for 10 days.   ondansetron 4 MG tablet Commonly known as: ZOFRAN Take  1 tablet (4 mg total) by mouth every 6 (six) hours as needed for nausea.   oxyCODONE-acetaminophen 5-325 MG tablet Commonly known as: Percocet Take 1 tablet by mouth every 6 (six) hours as needed for severe pain or moderate pain (post-operatively).   sertraline 50 MG tablet Commonly known as: ZOLOFT Take 50 mg by mouth daily.        Follow-up Information     [name], MD. Go in 1 week(s).   Specialty: Urology Contact information: [address] [place] [address] [phone]                Discharge Instructions     Discharge instructions   Complete by: As directed    From Dr. Loleta Books: You were admitted for chills and malaise and found to have a urinary tract infection. You were treated with antibiotics Your CT scan showed expected findings post-operatively from your procedure. You had one of your stents removed. Your urine culture grew E coli and Klebsiella, which are common infections that occur in the urinary tree.  You were treated with IV antibiotics and you should continue antibiotics with cephalexin: Take cephalexin 250 mg four times daily for the next 10 days Go see your Urologist Dr. Tresa Moore on Monday as directed   Increase activity slowly   Complete by: As directed        Discharge Exam: Filed Weights   11/14/21 1353 11/14/21 2130  Weight: 81.6 kg 83.6 kg  General: Pt is alert, awake, not in acute distress, sitting up in recliner Neuro/Psych: Strength symmetric in upper and lower extremities.  Judgment and insight appear normal.   Condition at discharge: Good  The results of significant diagnostics from this hospitalization (including imaging, microbiology, ancillary and laboratory) are listed below for reference.   Imaging Studies: CT ABDOMEN PELVIS W CONTRAST  Result Date: 11/14/2021 CLINICAL DATA:  Left lower quadrant abdominal pain. EXAM: CT ABDOMEN AND PELVIS WITH CONTRAST TECHNIQUE: Multidetector CT imaging of the abdomen and  pelvis was performed using the standard protocol following bolus administration of intravenous contrast. RADIATION DOSE REDUCTION: This exam was performed according to the departmental dose-optimization program which includes automated exposure control, adjustment of the mA and/or kV according to patient size and/or use of iterative reconstruction technique. CONTRAST:  163m OMNIPAQUE IOHEXOL 300 MG/ML  SOLN COMPARISON:  CT abdomen pelvis dated April 24, 2021. FINDINGS: Lower chest: No acute abnormality. Hepatobiliary: No focal liver abnormality is seen. No gallstones, gallbladder wall thickening, or biliary dilatation. Pancreas: Unremarkable. No pancreatic ductal dilatation or surrounding inflammatory changes. Spleen: Normal in size without focal abnormality. Unchanged calcified granulomas. Adrenals/Urinary Tract: Adrenal glands are unremarkable. Interval cystectomy with ileal conduit. New mild bilateral hydroureteronephrosis. Prominent urothelial enhancement of the left renal pelvis and ureter with surrounding fat stranding. Ureteral stent within the ileal conduit and left ureter. No calculi. Stomach/Bowel: Unchanged small hiatal hernia. The stomach is otherwise within normal limits. No bowel wall thickening, distention, or surrounding inflammatory changes. Vascular/Lymphatic: Aortic atherosclerosis. No enlarged abdominal or pelvic lymph nodes. Reproductive: Interval prostatectomy. Other: No abdominal wall hernia or abnormality. No abdominopelvic ascites. No pneumoperitoneum. Musculoskeletal: No acute or significant osseous findings. IMPRESSION: 1. Interval cystectomy with ileal conduit. New mild bilateral hydroureteronephrosis with prominent urothelial enhancement of the left renal pelvis and ureter with surrounding inflammation, concerning for ascending urinary tract infection. 2. Aortic Atherosclerosis (ICD10-I70.0). Electronically Signed   By: WTitus DubinM.D.   On: 11/14/2021 16:19     Microbiology: Results for orders placed or performed during the hospital encounter of 11/14/21  Urine Culture     Status: Abnormal   Collection Time: 11/14/21  1:55 PM   Specimen: Urine, Clean Catch  Result Value Ref Range Status   Specimen Description   Final    URINE, CLEAN CATCH [facility] [address] [place] [address]   Special Requests   Final    NONE [facility] [address], [place] [address]   Culture (A)  Final    >=100,000 COLONIES/mL KLEBSIELLA PNEUMONIAE >=100,000 COLONIES/mL ESCHERICHIA COLI    Report Status 11/16/2021 FINAL  Final   Organism ID, Bacteria KLEBSIELLA PNEUMONIAE (A)  Final   Organism ID, Bacteria ESCHERICHIA COLI (A)  Final      Susceptibility   Escherichia coli - MIC*    AMPICILLIN >=32 RESISTANT Resistant     CEFAZOLIN <=4 SENSITIVE Sensitive     CEFEPIME <=0.12 SENSITIVE Sensitive     CEFTRIAXONE <=0.25 SENSITIVE Sensitive     CIPROFLOXACIN <=0.25 SENSITIVE Sensitive     GENTAMICIN >=16 RESISTANT Resistant     IMIPENEM <=0.25 SENSITIVE Sensitive     NITROFURANTOIN <=16 SENSITIVE Sensitive     TRIMETH/SULFA >=320 RESISTANT Resistant     AMPICILLIN/SULBACTAM >=32 RESISTANT Resistant     PIP/TAZO <=4 SENSITIVE Sensitive     * >=100,000 COLONIES/mL ESCHERICHIA COLI   Klebsiella pneumoniae - MIC*    AMPICILLIN >=  32 RESISTANT Resistant     CEFAZOLIN <=4 SENSITIVE Sensitive     CEFEPIME <=0.12 SENSITIVE Sensitive     CEFTRIAXONE <=0.25 SENSITIVE Sensitive     CIPROFLOXACIN <=0.25 SENSITIVE Sensitive     GENTAMICIN <=1 SENSITIVE Sensitive     IMIPENEM <=0.25 SENSITIVE Sensitive     NITROFURANTOIN <=16 SENSITIVE Sensitive     TRIMETH/SULFA <=20 SENSITIVE Sensitive     AMPICILLIN/SULBACTAM 8 SENSITIVE Sensitive     PIP/TAZO <=4 SENSITIVE Sensitive     * >=100,000 COLONIES/mL KLEBSIELLA PNEUMONIAE  Blood culture (routine x 2)     Status: None  (Preliminary result)   Collection Time: 11/14/21  5:05 PM   Specimen: BLOOD  Result Value Ref Range Status   Specimen Description   Final    BLOOD LEFT ANTECUBITAL Performed at [facility] [address], [place], [place] [address]    Special Requests   Final    BOTTLES DRAWN AEROBIC AND ANAEROBIC Blood Culture adequate volume Performed at [facility] [address] [place], [place] [address]    Culture   Final    NO GROWTH 2 DAYS Performed at [facility] [address], [place], [place] [address]    Report Status PENDING  Incomplete  Blood culture (routine x 2)     Status: None (Preliminary result)   Collection Time: 11/14/21  5:05 PM   Specimen: BLOOD  Result Value Ref Range Status   Specimen Description   Final    BLOOD RIGHT ANTECUBITAL Performed at [facility] [address] [place], [place] [address]    Special Requests   Final    BOTTLES DRAWN AEROBIC AND ANAEROBIC Blood Culture adequate volume Performed at [facility] [address] [place], [place] [address]    Culture   Final    NO GROWTH 2 DAYS Performed at [facility] [address] [place], [place] [address]    Report Status PENDING  Incomplete  Resp Panel by RT-PCR (Flu A&B, Covid) Nasopharyngeal Swab     Status: None   Collection Time: 11/14/21  6:08 PM   Specimen: Nasopharyngeal Swab; Nasopharyngeal(NP) swabs in vial transport medium  Result Value Ref Range Status   SARS Coronavirus 2 by RT PCR NEGATIVE NEGATIVE Final    Comment: (NOTE) SARS-CoV-2 target nucleic acids are NOT DETECTED.  The SARS-CoV-2 RNA is generally detectable in upper respiratory specimens during the acute phase of infection. The lowest concentration of SARS-CoV-2 viral copies this assay can detect is 138 copies/mL. A negative result does not preclude SARS-Cov-2 infection and should not be used as the sole basis for treatment  or other patient management decisions. A negative result may occur with  improper specimen collection/handling, submission of specimen other than nasopharyngeal swab, presence of viral mutation(s) within the areas targeted by this assay, and inadequate number of viral copies(<138 copies/mL). A negative result must be combined with clinical observations, patient history, and epidemiological information. The expected result is Negative.  Fact Sheet for Patients:  EntrepreneurPulse.com.au  Fact Sheet for Healthcare Providers:  IncredibleEmployment.be  This test is no t yet approved or cleared by the [place] FDA and  has been authorized for detection and/or diagnosis of SARS-CoV-2 by FDA under an Emergency Use Authorization (EUA). This EUA will remain  in effect (meaning this test can be used) for the duration of the COVID-19 declaration under Section 564(b)(1) of the Act, 21 U.S.C.section 360bbb-3(b)(1), unless the authorization  is terminated  or revoked sooner.       Influenza A by PCR NEGATIVE NEGATIVE Final   Influenza B by PCR NEGATIVE NEGATIVE Final    Comment: (NOTE) The Xpert Xpress SARS-CoV-2/FLU/RSV plus assay is intended as an aid in the diagnosis of influenza from Nasopharyngeal swab specimens and should not be used as a sole basis for treatment. Nasal washings and aspirates are unacceptable for Xpert Xpress SARS-CoV-2/FLU/RSV testing.  Fact Sheet for Patients: EntrepreneurPulse.com.au  Fact Sheet for Healthcare Providers: IncredibleEmployment.be  This test is not yet approved or cleared by the [place] FDA and has been authorized for detection and/or diagnosis of SARS-CoV-2 by FDA under an Emergency Use Authorization (EUA). This EUA will remain in effect (meaning this test can be used) for the duration of the COVID-19 declaration under Section 564(b)(1) of the Act, 21 U.S.C. section  360bbb-3(b)(1), unless the authorization is terminated or revoked.  Performed at [facility], [place] [address] [place], [place] [address]     Labs: CBC: Recent Labs  Lab 11/14/21 1355 11/15/21 0549  WBC 10.3 8.0  HGB 13.1 12.1*  HCT 40.9 38.1*  MCV 84.3 86.2  PLT 160 824*   Basic Metabolic Panel: Recent Labs  Lab 11/14/21 1355 11/15/21 0549  NA 138 137  K 4.2 4.1  CL 107 106  CO2 22 22  GLUCOSE 116* 117*  BUN 32* 25*  CREATININE 1.45* 1.41*  CALCIUM 8.7* 8.5*   Liver Function Tests: Recent Labs  Lab 11/14/21 1355 11/15/21 0549  AST 20 16  ALT 20 16  ALKPHOS 58 51  BILITOT 0.7 0.5  PROT 7.2 6.6  ALBUMIN 3.6 3.2*   CBG: No results for input(s): GLUCAP in the last 168 hours.  Discharge time spent: 25 minutes.  Signed: Edwin Dada, MD [facility] 11/16/2021

## 2021-11-16 NOTE — Discharge Planning (Signed)
0700-1400 ?Patients discharge instructions and medications went over with patient. Patient's iv taken out and patient's belongings with patient and wheeled down to lobby  ?

## 2021-11-19 LAB — CULTURE, BLOOD (ROUTINE X 2)
Culture: NO GROWTH
Culture: NO GROWTH
Special Requests: ADEQUATE
Special Requests: ADEQUATE

## 2021-11-20 DIAGNOSIS — R8271 Bacteriuria: Secondary | ICD-10-CM | POA: Diagnosis not present

## 2021-11-20 DIAGNOSIS — Z8551 Personal history of malignant neoplasm of bladder: Secondary | ICD-10-CM | POA: Diagnosis not present

## 2021-11-20 DIAGNOSIS — Z936 Other artificial openings of urinary tract status: Secondary | ICD-10-CM | POA: Diagnosis not present

## 2021-11-26 ENCOUNTER — Emergency Department (HOSPITAL_COMMUNITY): Payer: Medicare Other

## 2021-11-26 ENCOUNTER — Encounter (HOSPITAL_COMMUNITY): Payer: Self-pay

## 2021-11-26 ENCOUNTER — Inpatient Hospital Stay (HOSPITAL_COMMUNITY)
Admission: EM | Admit: 2021-11-26 | Discharge: 2021-11-29 | DRG: 690 | Disposition: A | Discharge status: Home / Self Care | Payer: Medicare Other | Attending: Internal Medicine | Admitting: Internal Medicine

## 2021-11-26 ENCOUNTER — Observation Stay (HOSPITAL_COMMUNITY): Payer: Medicare Other

## 2021-11-26 ENCOUNTER — Other Ambulatory Visit: Payer: Self-pay

## 2021-11-26 DIAGNOSIS — F411 Generalized anxiety disorder: Secondary | ICD-10-CM | POA: Diagnosis present

## 2021-11-26 DIAGNOSIS — N179 Acute kidney failure, unspecified: Secondary | ICD-10-CM | POA: Diagnosis not present

## 2021-11-26 DIAGNOSIS — Z87891 Personal history of nicotine dependence: Secondary | ICD-10-CM

## 2021-11-26 DIAGNOSIS — N1831 Chronic kidney disease, stage 3a: Secondary | ICD-10-CM | POA: Diagnosis present

## 2021-11-26 DIAGNOSIS — S0990XA Unspecified injury of head, initial encounter: Secondary | ICD-10-CM | POA: Diagnosis not present

## 2021-11-26 DIAGNOSIS — N1330 Unspecified hydronephrosis: Secondary | ICD-10-CM | POA: Diagnosis not present

## 2021-11-26 DIAGNOSIS — Z8546 Personal history of malignant neoplasm of prostate: Secondary | ICD-10-CM

## 2021-11-26 DIAGNOSIS — R531 Weakness: Secondary | ICD-10-CM

## 2021-11-26 DIAGNOSIS — Z20822 Contact with and (suspected) exposure to covid-19: Secondary | ICD-10-CM | POA: Diagnosis present

## 2021-11-26 DIAGNOSIS — R000 Tachycardia, unspecified: Secondary | ICD-10-CM | POA: Diagnosis not present

## 2021-11-26 DIAGNOSIS — E871 Hypo-osmolality and hyponatremia: Secondary | ICD-10-CM | POA: Diagnosis not present

## 2021-11-26 DIAGNOSIS — N3000 Acute cystitis without hematuria: Secondary | ICD-10-CM | POA: Diagnosis not present

## 2021-11-26 DIAGNOSIS — D638 Anemia in other chronic diseases classified elsewhere: Secondary | ICD-10-CM

## 2021-11-26 DIAGNOSIS — Z743 Need for continuous supervision: Secondary | ICD-10-CM | POA: Diagnosis not present

## 2021-11-26 DIAGNOSIS — C679 Malignant neoplasm of bladder, unspecified: Secondary | ICD-10-CM | POA: Diagnosis present

## 2021-11-26 DIAGNOSIS — Y92009 Unspecified place in unspecified non-institutional (private) residence as the place of occurrence of the external cause: Secondary | ICD-10-CM | POA: Diagnosis not present

## 2021-11-26 DIAGNOSIS — R6510 Systemic inflammatory response syndrome (SIRS) of non-infectious origin without acute organ dysfunction: Secondary | ICD-10-CM | POA: Diagnosis present

## 2021-11-26 DIAGNOSIS — I499 Cardiac arrhythmia, unspecified: Secondary | ICD-10-CM | POA: Diagnosis not present

## 2021-11-26 DIAGNOSIS — N390 Urinary tract infection, site not specified: Principal | ICD-10-CM | POA: Diagnosis present

## 2021-11-26 DIAGNOSIS — D72829 Elevated white blood cell count, unspecified: Secondary | ICD-10-CM | POA: Diagnosis not present

## 2021-11-26 DIAGNOSIS — Z79899 Other long term (current) drug therapy: Secondary | ICD-10-CM

## 2021-11-26 DIAGNOSIS — D631 Anemia in chronic kidney disease: Secondary | ICD-10-CM | POA: Diagnosis not present

## 2021-11-26 DIAGNOSIS — Z043 Encounter for examination and observation following other accident: Secondary | ICD-10-CM | POA: Diagnosis not present

## 2021-11-26 DIAGNOSIS — N400 Enlarged prostate without lower urinary tract symptoms: Secondary | ICD-10-CM | POA: Insufficient documentation

## 2021-11-26 DIAGNOSIS — R0689 Other abnormalities of breathing: Secondary | ICD-10-CM | POA: Diagnosis not present

## 2021-11-26 DIAGNOSIS — Z8661 Personal history of infections of the central nervous system: Secondary | ICD-10-CM

## 2021-11-26 DIAGNOSIS — N136 Pyonephrosis: Principal | ICD-10-CM | POA: Diagnosis present

## 2021-11-26 DIAGNOSIS — J4520 Mild intermittent asthma, uncomplicated: Secondary | ICD-10-CM | POA: Insufficient documentation

## 2021-11-26 DIAGNOSIS — E785 Hyperlipidemia, unspecified: Secondary | ICD-10-CM | POA: Diagnosis not present

## 2021-11-26 DIAGNOSIS — W19XXXA Unspecified fall, initial encounter: Secondary | ICD-10-CM

## 2021-11-26 DIAGNOSIS — Z8551 Personal history of malignant neoplasm of bladder: Secondary | ICD-10-CM | POA: Diagnosis not present

## 2021-11-26 LAB — COMPREHENSIVE METABOLIC PANEL
ALT: 59 U/L — ABNORMAL HIGH (ref 0–44)
AST: 35 U/L (ref 15–41)
Albumin: 2.9 g/dL — ABNORMAL LOW (ref 3.5–5.0)
Alkaline Phosphatase: 85 U/L (ref 38–126)
Anion gap: 9 (ref 5–15)
BUN: 25 mg/dL — ABNORMAL HIGH (ref 8–23)
CO2: 23 mmol/L (ref 22–32)
Calcium: 8.4 mg/dL — ABNORMAL LOW (ref 8.9–10.3)
Chloride: 101 mmol/L (ref 98–111)
Creatinine, Ser: 1.71 mg/dL — ABNORMAL HIGH (ref 0.61–1.24)
GFR, Estimated: 40 mL/min — ABNORMAL LOW (ref >60)
Glucose, Bld: 117 mg/dL — ABNORMAL HIGH (ref 70–99)
Potassium: 3.8 mmol/L (ref 3.5–5.1)
Sodium: 133 mmol/L — ABNORMAL LOW (ref 135–145)
Total Bilirubin: 0.6 mg/dL (ref 0.3–1.2)
Total Protein: 6.7 g/dL (ref 6.5–8.1)

## 2021-11-26 LAB — CBC WITH DIFFERENTIAL/PLATELET
Abs Immature Granulocytes: 0.21 10*3/uL — ABNORMAL HIGH (ref 0.00–0.07)
Basophils Absolute: 0 10*3/uL (ref 0.0–0.1)
Basophils Relative: 0 %
Eosinophils Absolute: 0 10*3/uL (ref 0.0–0.5)
Eosinophils Relative: 0 %
HCT: 35.4 % — ABNORMAL LOW (ref 39.0–52.0)
Hemoglobin: 11.5 g/dL — ABNORMAL LOW (ref 13.0–17.0)
Immature Granulocytes: 1 %
Lymphocytes Relative: 4 %
Lymphs Abs: 0.9 10*3/uL (ref 0.7–4.0)
MCH: 26.6 pg (ref 26.0–34.0)
MCHC: 32.5 g/dL (ref 30.0–36.0)
MCV: 81.8 fL (ref 80.0–100.0)
Monocytes Absolute: 1.3 10*3/uL — ABNORMAL HIGH (ref 0.1–1.0)
Monocytes Relative: 5 %
Neutro Abs: 22 10*3/uL — ABNORMAL HIGH (ref 1.7–7.7)
Neutrophils Relative %: 90 %
Platelets: 205 10*3/uL (ref 150–400)
RBC: 4.33 MIL/uL (ref 4.22–5.81)
RDW: 13.4 % (ref 11.5–15.5)
WBC: 24.5 10*3/uL — ABNORMAL HIGH (ref 4.0–10.5)
nRBC: 0 % (ref 0.0–0.2)

## 2021-11-26 LAB — URINALYSIS, MICROSCOPIC (REFLEX)

## 2021-11-26 LAB — URINALYSIS, ROUTINE W REFLEX MICROSCOPIC
Bilirubin Urine: NEGATIVE
Glucose, UA: NEGATIVE mg/dL
Ketones, ur: NEGATIVE mg/dL
Nitrite: POSITIVE — AB
Protein, ur: 30 mg/dL — AB
Specific Gravity, Urine: 1.02 (ref 1.005–1.030)
pH: 6 (ref 5.0–8.0)

## 2021-11-26 LAB — RESP PANEL BY RT-PCR (FLU A&B, COVID) ARPGX2
Influenza A by PCR: NEGATIVE
Influenza B by PCR: NEGATIVE
SARS Coronavirus 2 by RT PCR: NEGATIVE

## 2021-11-26 MED ORDER — SODIUM CHLORIDE 0.9 % IV SOLN
1.0000 g | Freq: Two times a day (BID) | INTRAVENOUS | Status: DC
  Administered 2021-11-26 – 2021-11-27 (×2): 1 g via INTRAVENOUS
  Filled 2021-11-26 (×3): qty 1

## 2021-11-26 MED ORDER — ONDANSETRON HCL 4 MG PO TABS: 4.0000 mg | ORAL_TABLET | Freq: Four times a day (QID) | ORAL | Status: DC | PRN

## 2021-11-26 MED ORDER — SODIUM CHLORIDE 0.9 % IV SOLN
2.0000 g | Freq: Once | INTRAVENOUS | Status: AC
  Administered 2021-11-26: 2 g via INTRAVENOUS
  Filled 2021-11-26: qty 20

## 2021-11-26 MED ORDER — SODIUM CHLORIDE 0.9 % IV SOLN: INTRAVENOUS | Status: DC

## 2021-11-26 MED ORDER — ENOXAPARIN SODIUM 40 MG/0.4ML IJ SOSY
40.0000 mg | PREFILLED_SYRINGE | INTRAMUSCULAR | Status: DC
  Administered 2021-11-26 – 2021-11-28 (×2): 40 mg via SUBCUTANEOUS
  Filled 2021-11-26 (×3): qty 0.4

## 2021-11-26 MED ORDER — ATORVASTATIN CALCIUM 10 MG PO TABS
10.0000 mg | ORAL_TABLET | Freq: Every day | ORAL | Status: DC
  Administered 2021-11-26 – 2021-11-29 (×4): 10 mg via ORAL
  Filled 2021-11-26 (×4): qty 1

## 2021-11-26 MED ORDER — ACETAMINOPHEN 325 MG PO TABS
650.0000 mg | ORAL_TABLET | Freq: Four times a day (QID) | ORAL | Status: DC | PRN
  Administered 2021-11-26 – 2021-11-28 (×5): 650 mg via ORAL
  Filled 2021-11-26 (×5): qty 2

## 2021-11-26 MED ORDER — ONDANSETRON HCL 4 MG/2ML IJ SOLN: 4.0000 mg | Freq: Four times a day (QID) | INTRAMUSCULAR | Status: DC | PRN

## 2021-11-26 MED ORDER — ACETAMINOPHEN 650 MG RE SUPP: 650.0000 mg | Freq: Four times a day (QID) | RECTAL | Status: DC | PRN

## 2021-11-26 MED ORDER — SERTRALINE HCL 50 MG PO TABS
50.0000 mg | ORAL_TABLET | Freq: Every day | ORAL | Status: DC
  Administered 2021-11-26 – 2021-11-29 (×4): 50 mg via ORAL
  Filled 2021-11-26 (×4): qty 1

## 2021-11-26 NOTE — ED Provider Notes (Signed)
?[facility] DEPT ?Provider Note ? ? ?CSN: [id number] ?Arrival date & time: 11/26/21  0736 ? ?  ? ?History ? ?Chief Complaint  ?Patient presents with  ? Fall  ? ? ?Benjamin Hensley is a 81 y.o. male. ? ?HPI ?Patient is here after a fall at his home while he was walking in his hallway.  He thinks that he might have a urinary tract infection.  He is currently being treated for same with cephalexin, and nearly done with a 10-day course.  He recently had urinary bladder and prostate removed for cancer.  He feels weak and complains of having to seek frequent medical care since then.  He is here with his wife who helps to give history.  Patient reports he hit his head when he fell earlier today but does not have headache or neck pain at this time. ?  ? ?Home Medications ?Prior to Admission medications   ?Medication Sig Start Date End Date Taking? Authorizing Provider  ?acetaminophen (TYLENOL) 500 MG tablet Take 1,000 mg by mouth every 6 (six) hours as needed for moderate pain or headache.    [provider]  ?atorvastatin (LIPITOR) 10 MG tablet Take 10 mg by mouth daily. 02/17/21   [provider]  ?cephALEXin (KEFLEX) 250 MG capsule Take 1 capsule (250 mg total) by mouth 4 (four) times daily for 10 days. 11/16/21 11/26/21  Danford, Suann Larry, MD  ?ondansetron (ZOFRAN) 4 MG tablet Take 1 tablet (4 mg total) by mouth every 6 (six) hours as needed for nausea. 11/16/21   Danford, Suann Larry, MD  ?oxyCODONE-acetaminophen (PERCOCET) 5-325 MG tablet Take 1 tablet by mouth every 6 (six) hours as needed for severe pain or moderate pain (post-operatively). 09/28/21 09/28/22  [name], MD  ?sertraline (ZOLOFT) 50 MG tablet Take 50 mg by mouth daily. 12/11/16   [provider]  ?   ? ?Allergies    ?Patient has no known allergies.   ? ?Review of Systems   ?Review of Systems ? ?Physical Exam ?Updated Vital Signs ?BP 115/63   Pulse 80   Temp 98.6 ?F (37 ?C) (Oral)   Resp 20    SpO2 97%  ?Physical Exam ?Vitals and nursing note reviewed.  ?Constitutional:   ?   General: He is not in acute distress. ?   Appearance: He is well-developed. He is not ill-appearing or diaphoretic.  ?HENT:  ?   Head: Normocephalic and atraumatic.  ?   Comments: No visible or palpable injury to head. ?   Right Ear: External ear normal.  ?   Left Ear: External ear normal.  ?Eyes:  ?   Conjunctiva/sclera: Conjunctivae normal.  ?   Pupils: Pupils are equal, round, and reactive to light.  ?Neck:  ?   Trachea: Phonation normal.  ?Cardiovascular:  ?   Rate and Rhythm: Normal rate and regular rhythm.  ?   Heart sounds: Normal heart sounds.  ?Pulmonary:  ?   Effort: Pulmonary effort is normal. No respiratory distress.  ?   Breath sounds: Normal breath sounds. No stridor.  ?Abdominal:  ?   General: There is no distension.  ?   Palpations: Abdomen is soft.  ?   Tenderness: There is no abdominal tenderness.  ?   Comments: Ostomy right mid abdomen, draining yellow urine into bag with mucus present.  ?Musculoskeletal:     ?   General: Normal range of motion.  ?   Cervical back: Normal range of motion and  neck supple.  ?Skin: ?   General: Skin is warm and dry.  ?   Coloration: Skin is not jaundiced or pale.  ?Neurological:  ?   Mental Status: He is alert and oriented to person, place, and time.  ?   Cranial Nerves: No cranial nerve deficit.  ?   Sensory: No sensory deficit.  ?   Motor: No abnormal muscle tone.  ?   Coordination: Coordination normal.  ?   Comments: No dysarthria or aphasia.  ?Psychiatric:     ?   Mood and Affect: Mood normal.     ?   Behavior: Behavior normal.     ?   Thought Content: Thought content normal.     ?   Judgment: Judgment normal.  ? ? ?ED Results / Procedures / Treatments   ?Labs ?(all labs ordered are listed, but only abnormal results are displayed) ?Labs Reviewed  ?COMPREHENSIVE METABOLIC PANEL - Abnormal; Notable for the following components:  ?    Result Value  ? Sodium 133 (*)   ? Glucose, Bld  117 (*)   ? BUN 25 (*)   ? Creatinine, Ser 1.71 (*)   ? Calcium 8.4 (*)   ? Albumin 2.9 (*)   ? ALT 59 (*)   ? GFR, Estimated 40 (*)   ? All other components within normal limits  ?CBC WITH DIFFERENTIAL/PLATELET - Abnormal; Notable for the following components:  ? WBC 24.5 (*)   ? Hemoglobin 11.5 (*)   ? HCT 35.4 (*)   ? Neutro Abs 22.0 (*)   ? Monocytes Absolute 1.3 (*)   ? Abs Immature Granulocytes 0.21 (*)   ? All other components within normal limits  ?URINALYSIS, ROUTINE W REFLEX MICROSCOPIC - Abnormal; Notable for the following components:  ? Hgb urine dipstick MODERATE (*)   ? Protein, ur 30 (*)   ? Nitrite POSITIVE (*)   ? Leukocytes,Ua TRACE (*)   ? All other components within normal limits  ?URINALYSIS, MICROSCOPIC (REFLEX) - Abnormal; Notable for the following components:  ? Bacteria, UA MANY (*)   ? All other components within normal limits  ?RESP PANEL BY RT-PCR (FLU A&B, COVID) [id number]  ?URINE CULTURE  ? ? ?EKG ?EKG Interpretation ? ?Date/Time:  Sunday November 26 2021 07:51:22 EDT ?Ventricular Rate:  110 ?PR Interval:  171 ?QRS Duration: 92 ?QT Interval:  322 ?QTC Calculation: 436 ?R Axis:   82 ?Text Interpretation: Sinus tachycardia Borderline right axis deviation Since last tracing rate faster Confirmed by Daleen Bo [phone]) on 11/26/2021 9:19:09 AM ? ?Radiology ?CT Head Wo Contrast ? ?Result Date: 11/26/2021 ?CLINICAL DATA:  Trauma, fall EXAM: CT HEAD WITHOUT CONTRAST TECHNIQUE: Contiguous axial images were obtained from the base of the skull through the vertex without intravenous contrast. RADIATION DOSE REDUCTION: This exam was performed according to the departmental dose-optimization program which includes automated exposure control, adjustment of the mA and/or kV according to patient size and/or use of iterative reconstruction technique. COMPARISON:  None. FINDINGS: Brain: No acute intracranial hemorrhage, mass effect, or herniation. No extra-axial fluid collections. No evidence of acute  territorial infarct. No hydrocephalus. Mild cortical volume loss. Patchy hypodensities in the periventricular and subcortical white matter, likely secondary to chronic microvascular ischemic changes. Vascular: No hyperdense vessel or unexpected calcification. Skull: Normal. Negative for fracture or focal lesion. Sinuses/Orbits: No acute finding. Other: None. IMPRESSION: Chronic changes with no acute intracranial process identified. Electronically Signed   By: Ofilia Neas M.D.   On: 11/26/2021  10:11  ? ?CT Cervical Spine Wo Contrast ? ?Result Date: 11/26/2021 ?CLINICAL DATA:  Status post fall. EXAM: CT CERVICAL SPINE WITHOUT CONTRAST TECHNIQUE: Multidetector CT imaging of the cervical spine was performed without intravenous contrast. Multiplanar CT image reconstructions were also generated. RADIATION DOSE REDUCTION: This exam was performed according to the departmental dose-optimization program which includes automated exposure control, adjustment of the mA and/or kV according to patient size and/or use of iterative reconstruction technique. COMPARISON:  None. FINDINGS: Alignment: Normal. Skull base and vertebrae: No acute fracture. No primary bone lesion or focal pathologic process. Soft tissues and spinal canal: No prevertebral fluid or swelling. No visible canal hematoma. Disc levels: Multilevel disc space narrowing and endplate spurring is noted at C4-5 through C6-7. Bilateral facet arthropathy is identified, left greater than right. Upper chest: Negative. Other: None IMPRESSION: 1. No evidence for cervical spine fracture or subluxation. 2. Multilevel degenerative disc disease and facet arthropathy. Electronically Signed   By: Kerby Moors M.D.   On: 11/26/2021 10:09   ? ?Procedures ?Procedures  ? ? ?Medications Ordered in ED ?Medications  ?0.9 %  sodium chloride infusion ( Intravenous New Bag/Given 11/26/21 0857)  ?cefTRIAXone (ROCEPHIN) 2 g in sodium chloride 0.9 % 100 mL IVPB (has no administration in  time range)  ? ? ?ED Course/ Medical Decision Making/ A&P ?  ?                        ?Medical Decision Making ?Patient presenting after fall, currently being treated for UTI.  He is taking cephalexin 250 mg, 4 t

## 2021-11-26 NOTE — ED Triage Notes (Signed)
EMS reports from home, called out for fall from standing, struck head, no blood thinners or LOC. No obvious injuries. Denies pain. Has been on ABX for 10 days for UTI, has urostomy. ? ?BP 115/67 ?HR 120 ?RR 30 ?Sp02 97 RA ?CBG 150 ?Temp 99.0 ? ?20ga L wrist ?343m LR enroute ? ?

## 2021-11-26 NOTE — Consult Note (Signed)
?Subjective: ?1. Urinary tract infection without hematuria, site unspecified   ?2. AKI (acute kidney injury) ([place])   ?3. Fall in home, initial encounter   ?4. Injury of head, initial encounter   ?  ? ?Consult requested by Dr. Christ Kick. ? ?Mr. Benjamin Hensley is a 81 yo male with a history of T1 HG NMIBC with variant histology who was initially diagnosed in 8/22 and had cystectomy on 09/21/21.  He was admitted on 11/14/21 with possible left pyelonephritis and was found on CT to have mild bilateral hydro with some urothelial enhancement on the left concerning for pyelonephritis.   He was treated with Rocephin and discharged on keflex to which but the e. Coli and klebsiella in his urine were sensitive. He just completed that script in the last 24hrs.   This morning he developed generalized weakness and had a fall.  He has had no flank pain except for a very transient episode after his IV was started.  He has had no fever.  He has been doing well with his stoma.  He has a leukocytosis of 24.5K.  His Cr is up some to 1.71 from 1.41 on 3/8.  A renal US was done today which shows mild bilateral hydro per report and on my review appears stable on the right but the left dilation may be more prominent than on the CT on 3/8 and the kidney is hazier.  He has had no hematuria.   ?ROS: ? ?Review of Systems  ?Constitutional:  Negative for chills and fever.  ?Respiratory:  Negative for cough and shortness of breath.   ?Cardiovascular:  Negative for chest pain and leg swelling.  ?Gastrointestinal:  Negative for abdominal pain, constipation, diarrhea, heartburn, nausea and vomiting.  ?Genitourinary:  Negative for flank pain.  ?Neurological:  Positive for weakness.  ?All other systems reviewed and are negative. ? ?No Known Allergies ? ?Past Medical History:  ?Diagnosis Date  ? Bladder cancer ([place])   ? Medical history non-contributory   ? Meningitis   ? hx of as a child affected right eye  ? ? ?Past Surgical History:  ?Procedure Laterality  Date  ? ciliodional cyst  yrs ago  ? COLONOSCOPY WITH PROPOFOL N/A 02/26/2017  ? Procedure: COLONOSCOPY WITH PROPOFOL;  Surgeon: Garlan Fair, MD;  Location: [facility];  Service: Endoscopy;  Laterality: N/A;  ? colonscopy  2008  ? HERNIA REPAIR    ? LYMPH NODE DISSECTION Bilateral 09/22/2021  ? Procedure: LYMPH NODE DISSECTION;  Surgeon: [name], MD;  Location: [facility];  Service: Urology;  Laterality: Bilateral;  ? ROBOT ASSISTED LAPAROSCOPIC COMPLETE CYSTECT ILEAL CONDUIT N/A 09/22/2021  ? Procedure: XI ROBOTIC ASSISTED LAPAROSCOPIC COMPLETE CYSTECT ILEAL CONDUIT WITH INDOCYANINE GREEN DYE;  Surgeon: [name], MD;  Location: [facility];  Service: Urology;  Laterality: N/A;  6 HRS  ? ROBOT ASSISTED LAPAROSCOPIC RADICAL PROSTATECTOMY N/A 09/22/2021  ? Procedure: XI ROBOTIC ASSISTED LAPAROSCOPIC RADICAL PROSTATECTOMY;  Surgeon: [name], MD;  Location: [facility];  Service: Urology;  Laterality: N/A;  ? TONSILLECTOMY  as child  ? TRANSURETHRAL RESECTION OF BLADDER TUMOR WITH MITOMYCIN-C N/A 05/02/2021  ? Procedure: TRANSURETHRAL RESECTION OF BLADDER TUMOR WITH GEMCITABINE;  Surgeon: Irine Seal, MD;  Location[facility];  Service: Urology;  Laterality: N/A;  GENERAL ANESTHESIA WITH PARALYSIS  ? UMBILICAL HERNIA REPAIR N/A 09/22/2021  ? Procedure: OPEN HERNIA REPAIR UMBILICAL ADULT;  Surgeon: [name], MD;  [facility];  Service: Urology;  Laterality: N/A;  ? ? ?Social History  ? ?  Socioeconomic History  ? Marital status: Married  ?  Spouse name: Not on file  ? Number of children: Not on file  ? Years of education: Not on file  ? Highest education level: Not on file  ?Occupational History  ? Not on file  ?Tobacco Use  ? Smoking status: Former  ?  Packs/day: 1.00  ?  Years: 30.00  ?  Pack years: 30.00  ?  Types: Cigarettes  ? Smokeless tobacco: Never  ?Vaping Use  ? Vaping Use: Never used  ?Substance and Sexual Activity  ? Alcohol use: Not Currently  ?  Comment: occ  ? Drug use: No  ? Sexual  activity: Not on file  ?Other Topics Concern  ? Not on file  ?Social History Narrative  ? Not on file  ? ?Social Determinants of Health  ? ?Financial Resource Strain: Not on file  ?Food Insecurity: Not on file  ?Transportation Needs: Not on file  ?Physical Activity: Not on file  ?Stress: Not on file  ?Social Connections: Not on file  ?Intimate Partner Violence: Not on file  ? ? ?History reviewed. No pertinent family history. ? ?Anti-infectives: ?Anti-infectives (From admission, onward)  ? ? Start     Dose/Rate Route Frequency Ordered Stop  ? 11/26/21 2000  ceFEPIme (MAXIPIME) 1 g in sodium chloride 0.9 % 100 mL IVPB       ? 1 g ?200 mL/hr over 30 Minutes Intravenous Every 12 hours 11/26/21 1459    ? 11/26/21 1245  cefTRIAXone (ROCEPHIN) 2 g in sodium chloride 0.9 % 100 mL IVPB       ? 2 g ?200 mL/hr over 30 Minutes Intravenous  Once 11/26/21 1241 11/26/21 1431  ? ?  ? ? ?Current Facility-Administered Medications  ?Medication Dose Route Frequency Provider Last Rate Last Admin  ? 0.9 %  sodium chloride infusion   Intravenous Continuous Kyle, Tyrone A, DO 125 mL/hr at 11/26/21 1634 New Bag at 11/26/21 1634  ? acetaminophen (TYLENOL) tablet 650 mg  650 mg Oral Q6H PRN Marylyn Ishihara, Tyrone A, DO   650 mg at 11/26/21 1630  ? Or  ? acetaminophen (TYLENOL) suppository 650 mg  650 mg Rectal Q6H PRN Marylyn Ishihara, Tyrone A, DO      ? atorvastatin (LIPITOR) tablet 10 mg  10 mg Oral Daily Kyle, Tyrone A, DO      ? ceFEPIme (MAXIPIME) 1 g in sodium chloride 0.9 % 100 mL IVPB  1 g Intravenous Q12H Kyle, Tyrone A, DO      ? enoxaparin (LOVENOX) injection 40 mg  40 mg Subcutaneous Q24H Kyle, Tyrone A, DO      ? ondansetron (ZOFRAN) tablet 4 mg  4 mg Oral Q6H PRN Marylyn Ishihara, Tyrone A, DO      ? Or  ? ondansetron (ZOFRAN) injection 4 mg  4 mg Intravenous Q6H PRN Marylyn Ishihara, Tyrone A, DO      ? sertraline (ZOLOFT) tablet 50 mg  50 mg Oral Daily Kyle, Tyrone A, DO      ? ? ? ?Objective: ?Vital signs in last 24 hours: ?BP 128/68 (BP Location: Right Arm)   Pulse 74    Temp 98 ?F (36.7 ?C) (Oral)   Resp 20   Ht '5\' 11"'$  (1.803 m)   Wt 80.1 kg   SpO2 98%   BMI 24.63 kg/m?  ? ?Intake/Output from previous day: ?No intake/output data recorded. ?Intake/Output this shift: ?Total I/O ?In: -  ?Out: 250 [Urine:250] ? ? ?Physical Exam ?Vitals reviewed.  ?  Constitutional:   ?   Appearance: Normal appearance.  ?Cardiovascular:  ?   Rate and Rhythm: Normal rate and regular rhythm.  ?Pulmonary:  ?   Effort: Pulmonary effort is normal. No respiratory distress.  ?Abdominal:  ?   General: Abdomen is flat.  ?   Palpations: Abdomen is soft.  ?   Tenderness: There is no abdominal tenderness. There is no right CVA tenderness or left CVA tenderness.  ?   Comments: Stoma in the RLQ is pink and productive.   ?Musculoskeletal:     ?   General: No swelling or tenderness. Normal range of motion.  ?Skin: ?   General: Skin is warm and dry.  ?Neurological:  ?   General: No focal deficit present.  ?   Mental Status: He is alert and oriented to person, place, and time.  ?Psychiatric:     ?   Mood and Affect: Mood normal.     ?   Behavior: Behavior normal.  ? ? ?Lab Results:  ?Results for orders placed or performed during the hospital encounter of 11/26/21 (from the past 24 hour(s))  ?Comprehensive metabolic panel     Status: Abnormal  ? Collection Time: 11/26/21  8:57 AM  ?Result Value Ref Range  ? Sodium 133 (L) 135 - 145 mmol/L  ? Potassium 3.8 3.5 - 5.1 mmol/L  ? Chloride 101 98 - 111 mmol/L  ? CO2 23 22 - 32 mmol/L  ? Glucose, Bld 117 (H) 70 - 99 mg/dL  ? BUN 25 (H) 8 - 23 mg/dL  ? Creatinine, Ser 1.71 (H) 0.61 - 1.24 mg/dL  ? Calcium 8.4 (L) 8.9 - 10.3 mg/dL  ? Total Protein 6.7 6.5 - 8.1 g/dL  ? Albumin 2.9 (L) 3.5 - 5.0 g/dL  ? AST 35 15 - 41 U/L  ? ALT 59 (H) 0 - 44 U/L  ? Alkaline Phosphatase 85 38 - 126 U/L  ? Total Bilirubin 0.6 0.3 - 1.2 mg/dL  ? GFR, Estimated 40 (L) >60 mL/min  ? Anion gap 9 5 - 15  ?CBC with Differential     Status: Abnormal  ? Collection Time: 11/26/21  8:57 AM  ?Result  Value Ref Range  ? WBC 24.5 (H) 4.0 - 10.5 K/uL  ? RBC 4.33 4.22 - 5.81 MIL/uL  ? Hemoglobin 11.5 (L) 13.0 - 17.0 g/dL  ? HCT 35.4 (L) 39.0 - 52.0 %  ? MCV 81.8 80.0 - 100.0 fL  ? MCH 26.6 26.0 - 34.0 pg  ? MCHC 32.5

## 2021-11-26 NOTE — H&P (Signed)
?History and Physical  ? ? ?Patient: Benjamin Hensley:[id number] DOB: [birth year] ?DOA: 11/26/2021 ?DOS: the patient was seen and examined on 11/26/2021 ?PCP: Lavone Orn, MD  ?Patient coming from: Home ? ?Chief Complaint:  ?Chief Complaint  ?Patient presents with  ? Fall  ? ?HPI: Benjamin Hensley is a 81 y.o. male with medical history significant of bladder cancer, prostate cancer, GAD, HLD. Presenting with generalized weakness and a fall. He reports that he got up from his chair to go adjust his thermostat this morning. He felt unsteady after he got up. He lost his balance as he stepped and fell backwards. He hit his head, but did not have any LOC. He was unable to get up on his own. His wife called for EMS who transported him to the hospital for evaluation. Other than general weakness over the last few days, he has not had any other symptoms. He denies any other aggravating or alleviating factors.  ? ?Of note, he was recently admitted for complicated UTI and started on keflex.   ? ?Review of Systems: As mentioned in the history of present illness. All other systems reviewed and are negative. ?Past Medical History:  ?Diagnosis Date  ? Bladder cancer ([place])   ? Medical history non-contributory   ? Meningitis   ? hx of as a child affected right eye  ? ?Past Surgical History:  ?Procedure Laterality Date  ? ciliodional cyst  yrs ago  ? COLONOSCOPY WITH PROPOFOL N/A 02/26/2017  ? Procedure: COLONOSCOPY WITH PROPOFOL;  Surgeon: Garlan Fair, MD;  [facility];  Service: Endoscopy;  Laterality: N/A;  ? colonscopy  2008  ? HERNIA REPAIR    ? LYMPH NODE DISSECTION Bilateral 09/22/2021  ? Procedure: LYMPH NODE DISSECTION;  Surgeon: [name], MD;  [facility];  Service: Urology;  Laterality: Bilateral;  ? ROBOT ASSISTED LAPAROSCOPIC COMPLETE CYSTECT ILEAL CONDUIT N/A 09/22/2021  ? Procedure: XI ROBOTIC ASSISTED LAPAROSCOPIC COMPLETE CYSTECT ILEAL CONDUIT WITH INDOCYANINE GREEN DYE;  Surgeon: [name], MD;  [facility];  Service: Urology;  Laterality: N/A;  6 HRS  ? ROBOT ASSISTED LAPAROSCOPIC RADICAL PROSTATECTOMY N/A 09/22/2021  ? Procedure: XI ROBOTIC ASSISTED LAPAROSCOPIC RADICAL PROSTATECTOMY;  Surgeon: [name], MD;  [facility];  Service: Urology;  Laterality: N/A;  ? TONSILLECTOMY  as child  ? TRANSURETHRAL RESECTION OF BLADDER TUMOR WITH MITOMYCIN-C N/A 05/02/2021  ? Procedure: TRANSURETHRAL RESECTION OF BLADDER TUMOR WITH GEMCITABINE;  Surgeon: Irine Seal, MD;  [facility];  Service: Urology;  Laterality: N/A;  GENERAL ANESTHESIA WITH PARALYSIS  ? UMBILICAL HERNIA REPAIR N/A 09/22/2021  ? Procedure: OPEN HERNIA REPAIR UMBILICAL ADULT;  Surgeon: [name], MD;  Location[facility];  Service: Urology;  Laterality: N/A;  ? ?Social History:  reports that he has quit smoking. His smoking use included cigarettes. He has a 30.00 pack-year smoking history. He has never used smokeless tobacco. He reports that he does not currently use alcohol. He reports that he does not use drugs. ? ?No Known Allergies ? ?History reviewed. No pertinent family history. ? ?Prior to Admission medications   ?Medication Sig Start Date End Date Taking? Authorizing Provider  ?acetaminophen (TYLENOL) 500 MG tablet Take 1,000 mg by mouth every 6 (six) hours as needed for moderate pain or headache.    [provider]  ?atorvastatin (LIPITOR) 10 MG tablet Take 10 mg by mouth daily. 02/17/21   [provider]  ?cephALEXin (KEFLEX) 250 MG capsule Take 1 capsule (250  mg total) by mouth 4 (four) times daily for 10 days. 11/16/21 11/26/21  Danford, Suann Larry, MD  ?ondansetron (ZOFRAN) 4 MG tablet Take 1 tablet (4 mg total) by mouth every 6 (six) hours as needed for nausea. 11/16/21   Danford, Suann Larry, MD  ?oxyCODONE-acetaminophen (PERCOCET) 5-325 MG tablet Take 1 tablet by mouth every 6 (six) hours as needed for severe pain or moderate pain (post-operatively). 09/28/21 09/28/22  [name], MD  ?sertraline (ZOLOFT) 50 MG tablet Take 50 mg by mouth daily. 12/11/16   [provider]  ? ? ?Physical Exam: ?Vitals:  ? 11/26/21 1222 11/26/21 1300 11/26/21 1330 11/26/21 1400  ?BP: 115/63 104/64 125/68 105/71  ?Pulse: 80 80 80 77  ?Resp: '20 17 19 15  '$ ?Temp:      ?TempSrc:      ?SpO2: 97% 95% 95% 96%  ? ?General: 81 y.o. male resting in bed in NAD ?Eyes: PERRL, normal sclera ?ENMT: Nares patent w/o discharge, orophaynx clear, dentition normal, ears w/o discharge/lesions/ulcers ?Neck: Supple, trachea midline ?Cardiovascular: RRR, +S1, S2, no m/g/r, equal pulses throughout ?Respiratory: CTABL, no w/r/r, normal WOB ?GI: BS+, NDNT, no masses noted, no organomegaly noted, ostomy noted ?MSK: No e/c/c ?Neuro: A&O x 3, no focal deficits ?Psyc: Appropriate interaction and affect, calm/cooperative ? ?Data Reviewed: ? ?Na+  133 ?BUN  25 ?Scr  1.71 ?Albumin  2.9 ?WBC  24.5 ?Hgb  11.5 ? ?UA positive for leukocytes/nitrites ? ?CTH: Chronic changes with no acute intracranial process identified. ? ?CT c-spine: 1. No evidence for cervical spine fracture or subluxation. 2. Multilevel degenerative disc disease and facet arthropathy. ? ?Assessment and Plan: ?No notes have been filed under this hospital service. ?Service: Hospitalist ?UTI ?SIRS ?    - place in obs, med-surg ?    - failed outpt tx w/ keflex; completed 10-day course after recent admission for complicated UTI ?    - EDP spoke with urology; no urological intervention needed at this time ?    - started on rocephin in ED; let's broaden to cefepime for now and check urine culture ?    - fluids ? ?Generalized weakness ?Fall ?    - likely secondary to UTI ?    - did not syncopize ?    - PT/OT eval ?    - imaging free of fractures ? ?AKI ?    - renal US, fluids; watch nephrotoxins ? ?Normocytic anemia ?    - no evidence of bleed, check iron studies ? ?HLD ?    - continue statin ? ?GAD ?    - continue zoloft ? ?Hx of prostate cancer ?Hx of bladder cancer ?     - s/p cystoprostatectomy w/ conduit diversion and urostomy ?    - continue outpt follow up ? ?Advance Care Planning:   Code Status: FULL ? ?Consults: EDP spoke w/ urology ? ?Family Communication: w/ wife at bedside ? ?Severity of Illness: ?The appropriate patient status for this patient is INPATIENT. Inpatient status is judged to be reasonable and necessary in order to provide the required intensity of service to ensure the patient's safety. The patient's presenting symptoms, physical exam findings, and initial radiographic and laboratory data in the context of their chronic comorbidities is felt to place them at high risk for further clinical deterioration. Furthermore, it is not anticipated that the patient will be medically stable for discharge from the hospital within 2 midnights of admission.  ? ?* I certify that at the point of  admission it is my clinical judgment that the patient will require inpatient hospital care spanning beyond 2 midnights from the point of admission due to high intensity of service, high risk for further deterioration and high frequency of surveillance required.* ? ?Author: ?Jonnie Finner, DO ?11/26/2021 2:33 PM ? ?For on call review [email or web address].  ?

## 2021-11-27 DIAGNOSIS — R6510 Systemic inflammatory response syndrome (SIRS) of non-infectious origin without acute organ dysfunction: Secondary | ICD-10-CM | POA: Diagnosis present

## 2021-11-27 DIAGNOSIS — R531 Weakness: Secondary | ICD-10-CM

## 2021-11-27 DIAGNOSIS — E871 Hypo-osmolality and hyponatremia: Secondary | ICD-10-CM | POA: Diagnosis present

## 2021-11-27 DIAGNOSIS — N390 Urinary tract infection, site not specified: Secondary | ICD-10-CM | POA: Diagnosis present

## 2021-11-27 DIAGNOSIS — Y92009 Unspecified place in unspecified non-institutional (private) residence as the place of occurrence of the external cause: Secondary | ICD-10-CM | POA: Diagnosis not present

## 2021-11-27 DIAGNOSIS — E785 Hyperlipidemia, unspecified: Secondary | ICD-10-CM

## 2021-11-27 DIAGNOSIS — N3000 Acute cystitis without hematuria: Secondary | ICD-10-CM | POA: Diagnosis not present

## 2021-11-27 DIAGNOSIS — D638 Anemia in other chronic diseases classified elsewhere: Secondary | ICD-10-CM

## 2021-11-27 DIAGNOSIS — W19XXXA Unspecified fall, initial encounter: Secondary | ICD-10-CM

## 2021-11-27 DIAGNOSIS — N1831 Chronic kidney disease, stage 3a: Secondary | ICD-10-CM | POA: Diagnosis present

## 2021-11-27 DIAGNOSIS — N179 Acute kidney failure, unspecified: Secondary | ICD-10-CM

## 2021-11-27 DIAGNOSIS — Z8661 Personal history of infections of the central nervous system: Secondary | ICD-10-CM | POA: Diagnosis not present

## 2021-11-27 DIAGNOSIS — C679 Malignant neoplasm of bladder, unspecified: Secondary | ICD-10-CM | POA: Diagnosis not present

## 2021-11-27 DIAGNOSIS — Z79899 Other long term (current) drug therapy: Secondary | ICD-10-CM | POA: Diagnosis not present

## 2021-11-27 DIAGNOSIS — S0990XA Unspecified injury of head, initial encounter: Secondary | ICD-10-CM | POA: Diagnosis present

## 2021-11-27 DIAGNOSIS — F411 Generalized anxiety disorder: Secondary | ICD-10-CM

## 2021-11-27 DIAGNOSIS — D631 Anemia in chronic kidney disease: Secondary | ICD-10-CM | POA: Diagnosis present

## 2021-11-27 DIAGNOSIS — D72829 Elevated white blood cell count, unspecified: Secondary | ICD-10-CM | POA: Diagnosis present

## 2021-11-27 DIAGNOSIS — Z87891 Personal history of nicotine dependence: Secondary | ICD-10-CM | POA: Diagnosis not present

## 2021-11-27 DIAGNOSIS — Z8546 Personal history of malignant neoplasm of prostate: Secondary | ICD-10-CM | POA: Diagnosis not present

## 2021-11-27 DIAGNOSIS — N136 Pyonephrosis: Secondary | ICD-10-CM | POA: Diagnosis present

## 2021-11-27 DIAGNOSIS — Z8551 Personal history of malignant neoplasm of bladder: Secondary | ICD-10-CM | POA: Diagnosis not present

## 2021-11-27 DIAGNOSIS — Z20822 Contact with and (suspected) exposure to covid-19: Secondary | ICD-10-CM | POA: Diagnosis present

## 2021-11-27 LAB — CBC
HCT: 33.1 % — ABNORMAL LOW (ref 39.0–52.0)
Hemoglobin: 10.5 g/dL — ABNORMAL LOW (ref 13.0–17.0)
MCH: 26.6 pg (ref 26.0–34.0)
MCHC: 31.7 g/dL (ref 30.0–36.0)
MCV: 83.8 fL (ref 80.0–100.0)
Platelets: 183 10*3/uL (ref 150–400)
RBC: 3.95 MIL/uL — ABNORMAL LOW (ref 4.22–5.81)
RDW: 13.5 % (ref 11.5–15.5)
WBC: 15 10*3/uL — ABNORMAL HIGH (ref 4.0–10.5)
nRBC: 0 % (ref 0.0–0.2)

## 2021-11-27 LAB — COMPREHENSIVE METABOLIC PANEL
ALT: 50 U/L — ABNORMAL HIGH (ref 0–44)
AST: 33 U/L (ref 15–41)
Albumin: 2.6 g/dL — ABNORMAL LOW (ref 3.5–5.0)
Alkaline Phosphatase: 82 U/L (ref 38–126)
Anion gap: 7 (ref 5–15)
BUN: 29 mg/dL — ABNORMAL HIGH (ref 8–23)
CO2: 23 mmol/L (ref 22–32)
Calcium: 8.1 mg/dL — ABNORMAL LOW (ref 8.9–10.3)
Chloride: 108 mmol/L (ref 98–111)
Creatinine, Ser: 1.7 mg/dL — ABNORMAL HIGH (ref 0.61–1.24)
GFR, Estimated: 41 mL/min — ABNORMAL LOW (ref >60)
Glucose, Bld: 108 mg/dL — ABNORMAL HIGH (ref 70–99)
Potassium: 4.1 mmol/L (ref 3.5–5.1)
Sodium: 138 mmol/L (ref 135–145)
Total Bilirubin: 0.2 mg/dL — ABNORMAL LOW (ref 0.3–1.2)
Total Protein: 6.1 g/dL — ABNORMAL LOW (ref 6.5–8.1)

## 2021-11-27 LAB — IRON AND TIBC
Iron: 13 ug/dL — ABNORMAL LOW (ref 45–182)
Saturation Ratios: 7 % — ABNORMAL LOW (ref 17.9–39.5)
TIBC: 192 ug/dL — ABNORMAL LOW (ref 250–450)
UIBC: 179 ug/dL

## 2021-11-27 MED ORDER — SODIUM CHLORIDE 0.9 % IV SOLN
2.0000 g | Freq: Two times a day (BID) | INTRAVENOUS | Status: DC
  Administered 2021-11-27 – 2021-11-29 (×4): 2 g via INTRAVENOUS
  Filled 2021-11-27 (×4): qty 2

## 2021-11-27 NOTE — Progress Notes (Signed)
?PROGRESS NOTE ? ? ? ?FOUNTAIN DERUSHA  KCL:[id number] DOB: 06-24-1941 DOA: 11/26/2021 ?PCP: Lavone Orn, MD  ? ?Brief Narrative:  ?81 y.o. male with medical history significant of bladder cancer, prostate cancer, GAD, HLD, recent hospitalization for UTI treated with IV antibiotics and subsequently discharged on oral antibiotics presented with fall and generalized weakness.  On presentation, creatinine was 1.71, WBC of 24.5 with UA positive for trace leukocyte esterase and also positive for nitrites.  CT head was negative for acute intracranial process.  He was started on IV fluids and antibiotics.  CT cervical spine showed no evidence of cervical spine fracture or subluxation.  Renal ultrasound was showed mild bilateral hydronephrosis.  He was started on IV fluids and antibiotics.  Urology was consulted. ? ?Assessment & Plan: ?  ?Possible complicated UTI ?SIRS ?Mild bilateral hydronephrosis ?Leukocytosis ?-Continue cefepime.  Follow cultures.  Urology recommended conservative management for now with antibiotics and if he does not improve rapidly, nephrostomy tube placement could be considered. ?-WBCs improving.  Monitor ? 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  Creatinine 1.71 on presentation.  Creatinine 1.70 today. ?-Increase Normal Saline to 100 cc an hour ? ?Weakness ?Fall ?-From above. ?-PT eval. ?-No fractures seen on imaging on presentation ? ?Anemia of chronic disease ?-From kidney disease.  Hemoglobin stable.  Monitor ? ?Hyperlipidemia ?-Continue statin ? ?GAD ?-Continue sertraline ? ?History of prostate cancer/history of bladder cancer ?-Status post cystoprostatectomy w/ conduit diversion and urostomy ?- continue outpt follow up with urology ? ? ? ?DVT prophylaxis: Lovenox ?Code Status: Full ?Family Communication: None at bedside ?Disposition Plan: ?Status is: Observation ?The patient will require care spanning > 2 midnights and should be moved to  inpatient because: Of severity of illness.  Still needs IV fluids and antibiotics ? ? ? ?Consultants: Urology ? ?Procedures: None ? ?Antimicrobials: Cefepime from 11/26/2021 onwards ? ? ?Subjective: ?Patient seen and examined at bedside.  Still feels weak and has intermittent pain in the "kidney".  No overnight fever, vomiting, worsening shortness of breath reported. ? ?Objective: ?Vitals:  ? 11/26/21 1618 11/26/21 2033 11/26/21 2348 11/27/21 0446  ?BP:  113/62 138/72 (!) 147/76  ?Pulse:  76 86 75  ?Resp:  '18 18 18  '$ ?Temp:  98 ?F (36.7 ?C) 99.8 ?F (37.7 ?C) 97.8 ?F (36.6 ?C)  ?TempSrc:  Oral Oral Oral  ?SpO2:  97% 97% 98%  ?Weight: 80.1 kg     ?Height: '5\' 11"'$  (1.803 m)     ? ? ?Intake/Output Summary (Last 24 hours) at 11/27/2021 1022 ?Last data filed at 11/27/2021 4944 ?Gross per 24 hour  ?Intake 980 ml  ?Output 650 ml  ?Net 330 ml  ? ?Filed Weights  ? 11/26/21 1618  ?Weight: 80.1 kg  ? ? ?Examination: ? ?General exam: Appears calm and comfortable.  Currently on room air. ?Respiratory system: Bilateral decreased breath sounds at bases ?Cardiovascular system: S1 & S2 heard, Rate controlled ?Gastrointestinal system: Abdomen is nondistended, soft and nontender. Normal bowel sounds heard. ?Extremities: No cyanosis, clubbing, edema  ?Central nervous system: Alert and oriented. No focal neurological deficits. Moving extremities ?Skin: No rashes, lesions or ulcers ?Psychiatry: Judgement and insight appear normal. Mood & affect appropriate.  ?Genitourinary: Mild left flank tenderness present ? ? ? ?Data Reviewed: I have personally reviewed following labs and imaging studies ? ?CBC: ?Recent Labs  ?Lab 11/26/21 ?0857 11/27/21 ?9675  ?WBC 24.5* 15.0*  ?NEUTROABS 22.0*  --   ?HGB 11.5* 10.5*  ?  HCT 35.4* 33.1*  ?MCV 81.8 83.8  ?PLT 205 183  ? ?Basic Metabolic Panel: ?Recent Labs  ?Lab 11/26/21 ?0857 11/27/21 ?2202  ?NA 133* 138  ?K 3.8 4.1  ?CL 101 108  ?CO2 23 23  ?GLUCOSE 117* 108*  ?BUN 25* 29*  ?CREATININE 1.71* 1.70*  ?CALCIUM  8.4* 8.1*  ? ?GFR: ?Estimated Creatinine Clearance: 38.1 mL/min (A) (by C-G formula based on SCr of 1.7 mg/dL (H)). ?Liver Function Tests: ?Recent Labs  ?Lab 11/26/21 ?0857 11/27/21 ?5427  ?AST 35 33  ?ALT 59* 50*  ?ALKPHOS 85 82  ?BILITOT 0.6 0.2*  ?PROT 6.7 6.1*  ?ALBUMIN 2.9* 2.6*  ? ?No results for input(s): LIPASE, AMYLASE in the last 168 hours. ?No results for input(s): AMMONIA in the last 168 hours. ?Coagulation Profile: ?No results for input(s): INR, PROTIME in the last 168 hours. ?Cardiac Enzymes: ?No results for input(s): CKTOTAL, CKMB, CKMBINDEX, TROPONINI in the last 168 hours. ?BNP (last 3 results) ?No results for input(s): PROBNP in the last 8760 hours. ?HbA1C: ?No results for input(s): HGBA1C in the last 72 hours. ?CBG: ?No results for input(s): GLUCAP in the last 168 hours. ?Lipid Profile: ?No results for input(s): CHOL, HDL, LDLCALC, TRIG, CHOLHDL, LDLDIRECT in the last 72 hours. ?Thyroid Function Tests: ?No results for input(s): TSH, T4TOTAL, FREET4, T3FREE, THYROIDAB in the last 72 hours. ?Anemia Panel: ?Recent Labs  ?  11/27/21 ?0623  ?TIBC 192*  ?IRON 13*  ? ?Sepsis Labs: ?No results for input(s): PROCALCITON, LATICACIDVEN in the last 168 hours. ? ?Recent Results (from the past 240 hour(s))  ?Resp Panel by RT-PCR (Flu A&B, Covid) Urine, Bag (ped)     Status: None  ? Collection Time: 11/26/21  8:57 AM  ? Specimen: Urine, Bag (ped); Nasopharyngeal(NP) swabs in vial transport medium  ?Result Value Ref Range Status  ? SARS Coronavirus 2 by RT PCR NEGATIVE NEGATIVE Final  ?  Comment: (NOTE) ?SARS-CoV-2 target nucleic acids are NOT DETECTED. ? ?The SARS-CoV-2 RNA is generally detectable in upper respiratory ?specimens during the acute phase of infection. The lowest ?concentration of SARS-CoV-2 viral copies this assay can detect is ?138 copies/mL. A negative result does not preclude SARS-Cov-2 ?infection and should not be used as the sole basis for treatment or ?other patient management decisions. A  negative result may occur with  ?improper specimen collection/handling, submission of specimen other ?than nasopharyngeal swab, presence of viral mutation(s) within the ?areas targeted by this assay, and inadequate number of viral ?copies(<138 copies/mL). A negative result must be combined with ?clinical observations, patient history, and epidemiological ?information. The expected result is Negative. ? ?Fact Sheet for Patients:  ?EntrepreneurPulse.com.au ? ?Fact Sheet for Healthcare Providers:  ?IncredibleEmployment.be ? ?This test is no t yet approved or cleared by the [place] FDA and  ?has been authorized for detection and/or diagnosis of SARS-CoV-2 by ?FDA under an Emergency Use Authorization (EUA). This EUA will remain  ?in effect (meaning this test can be used) for the duration of the ?COVID-19 declaration under Section 564(b)(1) of the Act, 21 ?U.S.C.section 360bbb-3(b)(1), unless the authorization is terminated  ?or revoked sooner.  ? ? ?  ? Influenza A by PCR NEGATIVE NEGATIVE Final  ? Influenza B by PCR NEGATIVE NEGATIVE Final  ?  Comment: (NOTE) ?The Xpert Xpress SARS-CoV-2/FLU/RSV plus assay is intended as an aid ?in the diagnosis of influenza from Nasopharyngeal swab specimens and ?should not be used as a sole basis for treatment. Nasal washings and ?aspirates are unacceptable for Xpert Xpress  SARS-CoV-2/FLU/RSV ?testing. ? ?Fact Sheet for Patients: ?EntrepreneurPulse.com.au ? ?Fact Sheet for Healthcare Providers: ?IncredibleEmployment.be ? ?This test is not yet approved or cleared by the [place] FDA and ?has been authorized for detection and/or diagnosis of SARS-CoV-2 by ?FDA under an Emergency Use Authorization (EUA). This EUA will remain ?in effect (meaning this test can be used) for the duration of the ?COVID-19 declaration under Section 564(b)(1) of the Act, 21 U.S.C. ?section 360bbb-3(b)(1), unless the authorization  is terminated or ?revoked. ? ?Performed at [facility], [address] ?[place], [place] [address] ?  ?  ? ? ? ? ? ?Radiology Studies: ?CT Head Wo Contrast ? ?Result Date: 11/26/2021

## 2021-11-27 NOTE — Evaluation (Signed)
Physical Therapy Evaluation ?Patient Details ?Name: Benjamin Hensley ?MRN: [id number] ?DOB: 1941-04-18 ?Today's Date: 11/27/2021 ? ?History of Present Illness ? 81 y.o. male, with medical history of bladder cancer, umbilical hernia, prostate cancer, s/p cystoprostatectomy with no dissection and conduit diversion and open umbilical hernia repair on 09/22/2021.  Patient had urostomy placed on 09/22/2021.  Presented with abdominal pain and left lower quadrant which has been going for a week. Dx of complicated UTI.  ?Clinical Impression ? Pt admitted with above diagnosis. Pt ambulated 150' with IV pole, no loss of balance, distance limited by fatigue. Pt is mobilizing well enough to DC home.  Pt currently with functional limitations due to the deficits listed below (see PT Problem List). Pt will benefit from skilled PT to increase their independence and safety with mobility to allow discharge to the venue listed below.   ?   ?   ? ?Recommendations for follow up therapy are one component of a multi-disciplinary discharge planning process, led by the attending physician.  Recommendations may be updated based on patient status, additional functional criteria and insurance authorization. ? ?Follow Up Recommendations No PT follow up ? ?  ?Assistance Recommended at Discharge Set up Supervision/Assistance  ?Patient can return home with the following ? Assistance with cooking/housework ? ?  ?Equipment Recommendations None recommended by PT (pt declined RW)  ?Recommendations for Other Services ?    ?  ?Functional Status Assessment Patient has had a recent decline in their functional status and demonstrates the ability to make significant improvements in function in a reasonable and predictable amount of time.  ? ?  ?Precautions / Restrictions Precautions ?Precautions: Fall ?Precaution Comments: fall just prior to admission, no other falls in past 6 months ?Restrictions ?Weight Bearing Restrictions: No  ? ?  ? ?Mobility ? Bed  Mobility ?Overal bed mobility: Modified Independent ?  ?  ?  ?  ?  ?  ?General bed mobility comments: used bed rail, increased time ?  ? ?Transfers ?Overall transfer level: Needs assistance ?Equipment used: None ?Transfers: Sit to/from Stand ?Sit to Stand: Min guard ?  ?  ?  ?  ?  ?General transfer comment: min/guard for safety 2* recent falls ?  ? ?Ambulation/Gait ?Ambulation/Gait assistance: Min guard ?Gait Distance (Feet): 150 Feet ?Assistive device: IV Pole ?Gait Pattern/deviations: Step-through pattern, Decreased stride length, Trunk flexed ?Gait velocity: decr ?  ?  ?General Gait Details: steady with IV pole, distance limited by fatigue ? ?Stairs ?  ?  ?  ?  ?  ? ?Wheelchair Mobility ?  ? ?Modified Rankin (Stroke Patients Only) ?  ? ?  ? ?Balance Overall balance assessment: History of Falls, Needs assistance ?  ?Sitting balance-Leahy Scale: Good ?  ?  ?  ?Standing balance-Leahy Scale: Fair ?Standing balance comment: relies on single UE support for dynamic standing balance ?  ?  ?  ?  ?  ?  ?  ?  ?  ?  ?  ?   ? ? ? ?Pertinent Vitals/Pain Pain Assessment ?Pain Assessment: 0-10 ?Pain Score: 4  ?Pain Location: "kidneys" ?Pain Descriptors / Indicators: Aching ?Pain Intervention(s): Limited activity within patient's tolerance, Monitored during session, Premedicated before session  ? ? ?Home Living Family/patient expects to be discharged to:: Private residence ?Living Arrangements: Spouse/significant other ?Available Help at Discharge: Family ?Type of Home: House ?Home Access: Stairs to enter ?Entrance Stairs-Rails: None ?Entrance Stairs-Number of Steps: 3 ?  ?Home Layout: One level ?Home Equipment: None ?   ?  ?  Prior Function Prior Level of Function : Independent/Modified Independent;Driving ?  ?  ?  ?  ?  ?  ?Mobility Comments: walks without AD, denies falls in past 6 months ?ADLs Comments: independent ?  ? ? ?Hand Dominance  ?   ? ?  ?Extremity/Trunk Assessment  ? Upper Extremity Assessment ?Upper Extremity  Assessment: Overall WFL for tasks assessed ?  ? ?Lower Extremity Assessment ?Lower Extremity Assessment: Overall WFL for tasks assessed ?  ? ?Cervical / Trunk Assessment ?Cervical / Trunk Assessment: Normal  ?Communication  ? Communication: No difficulties  ?Cognition Arousal/Alertness: Awake/alert ?Behavior During Therapy: [facility] for tasks assessed/performed ?Overall Cognitive Status: Within Functional Limits for tasks assessed ?  ?  ?  ?  ?  ?  ?  ?  ?  ?  ?  ?  ?  ?  ?  ?  ?  ?  ?  ? ?  ?General Comments   ? ?  ?Exercises    ? ?Assessment/Plan  ?  ?PT Assessment Patient needs continued PT services  ?PT Problem List Decreased activity tolerance;Decreased balance ? ?   ?  ?PT Treatment Interventions Gait training;Therapeutic exercise;Balance training;Functional mobility training   ? ?PT Goals (Current goals can be found in the Care Plan section)  ?Acute Rehab PT Goals ?Patient Stated Goal: return home and to independence with mobility ?PT Goal Formulation: With patient ?Time For Goal Achievement: 12/11/21 ?Potential to Achieve Goals: Good ? ?  ?Frequency Min 3X/week ?  ? ? ?Co-evaluation   ?  ?  ?  ?  ? ? ?  ?AM-PAC PT "6 Clicks" Mobility  ?Outcome Measure Help needed turning from your back to your side while in a flat bed without using bedrails?: None ?Help needed moving from lying on your back to sitting on the side of a flat bed without using bedrails?: None ?Help needed moving to and from a bed to a chair (including a wheelchair)?: A Little ?Help needed standing up from a chair using your arms (e.g., wheelchair or bedside chair)?: A Little ?Help needed to walk in hospital room?: A Little ?Help needed climbing 3-5 steps with a railing? : A Little ?6 Click Score: 20 ? ?  ?End of Session Equipment Utilized During Treatment: Gait belt ?Activity Tolerance: Patient tolerated treatment well;Patient limited by fatigue ?Patient left: in chair;with call bell/phone within reach;with chair alarm set ?Nurse Communication:  Mobility status ?PT Visit Diagnosis: Difficulty in walking, not elsewhere classified (R26.2);Pain ?  ? ?Time: 8937-3428 ?PT Time Calculation (min) (ACUTE ONLY): 15 min ? ? ?Charges:   PT Evaluation ?$PT Eval Moderate Complexity: 1 Mod ?  ?  ?   ?Blondell Reveal Kistler PT 11/27/2021  ?Acute Rehabilitation Services ?Pager [phone] ?Office [phone] ? ? ?

## 2021-11-27 NOTE — Evaluation (Signed)
Occupational Therapy Evaluation ?Patient Details ?Name: Benjamin Hensley ?MRN: [id number] ?DOB: September 09, 1941 ?Today's Date: 11/27/2021 ? ? ?History of Present Illness 81 y.o. male, with medical history of bladder cancer, umbilical hernia, prostate cancer, s/p cystoprostatectomy with no dissection and conduit diversion and open umbilical hernia repair on 09/22/2021.  Patient had urostomy placed on 09/22/2021.  Presented with abdominal pain and left lower quadrant which has been going for a week. Dx of complicated UTI.  ? ?Clinical Impression ?  ?Patient evaluated by Occupational Therapy with no further acute OT needs identified. All education has been completed and the patient has no further questions. Patient and wife endorse patient being at baseline at this time for ADLs. Patient was able to complete ADLs with MI on this date.  See below for any follow-up Occupational Therapy or equipment needs. OT is signing off. Thank you for this referral.   ?   ? ?Recommendations for follow up therapy are one component of a multi-disciplinary discharge planning process, led by the attending physician.  Recommendations may be updated based on patient status, additional functional criteria and insurance authorization.  ? ?Follow Up Recommendations ? No OT follow up  ?  ?Assistance Recommended at Discharge Frequent or constant Supervision/Assistance  ?Patient can return home with the following Assistance with cooking/housework;Assist for transportation ? ?  ?Functional Status Assessment ? Patient has not had a recent decline in their functional status  ?Equipment Recommendations ? None recommended by OT  ?  ?Recommendations for Other Services   ? ? ?  ?Precautions / Restrictions Precautions ?Precautions: Fall ?Precaution Comments: fall just prior to admission, no other falls in past 6 months ?Restrictions ?Weight Bearing Restrictions: No  ? ?  ? ?Mobility Bed Mobility ?Overal bed mobility: Modified Independent ?  ?  ?  ?  ?  ?  ?  ?   ? ?Transfers ?  ?  ?  ?  ?  ?  ?  ?  ?  ?  ?  ? ?  ?Balance Overall balance assessment: History of Falls, Needs assistance ?  ?Sitting balance-Leahy Scale: Good ?  ?  ?Standing balance support: During functional activity, No upper extremity supported ?Standing balance-Leahy Scale: Fair ?Standing balance comment: no LOB noted ?  ?  ?  ?  ?  ?  ?  ?  ?  ?  ?  ?   ? ?ADL either performed or assessed with clinical judgement  ? ?ADL Overall ADL's : Modified independent ?  ?  ?  ?  ?  ?  ?  ?  ?  ?  ?  ?  ?  ?  ?  ?  ?  ?  ?  ?General ADL Comments: patient is able to complete LB dressing, grooming at sink, functional mobility in room, and bed mobiltiy with MI on this date. patient and wife were educated on ECT and recommendations for d/c. patient and wife verbalized understanding. patient endorsed being at baseline for ADLs at this time.  ? ? ? ?Vision Baseline Vision/History: 1 Wears glasses ?Ability to See in Adequate Light: 1 Impaired ?Patient Visual Report: No change from baseline ?   ?   ?Perception   ?  ?Praxis   ?  ? ?Pertinent Vitals/Pain Pain Assessment ?Pain Assessment: Faces ?Pain Score: 4  ?Pain Location: "kidneys" ?Pain Descriptors / Indicators: Aching ?Pain Intervention(s): Limited activity within patient's tolerance, Monitored during session, Premedicated before session  ? ? ? ?Hand Dominance Right ?  ?Extremity/Trunk  Assessment Upper Extremity Assessment ?Upper Extremity Assessment: Overall WFL for tasks assessed ?  ?Lower Extremity Assessment ?Lower Extremity Assessment: Defer to PT evaluation ?  ?Cervical / Trunk Assessment ?Cervical / Trunk Assessment: Normal ?  ?Communication Communication ?Communication: No difficulties ?  ?Cognition Arousal/Alertness: Awake/alert ?Behavior During Therapy: [facility] for tasks assessed/performed ?Overall Cognitive Status: Within Functional Limits for tasks assessed ?  ?  ?  ?  ?  ?  ?  ?  ?  ?  ?  ?  ?  ?  ?  ?  ?General Comments: wife was present for session as well.  patient was plesant and cooperative ?  ?  ?General Comments    ? ?  ?Exercises   ?  ?Shoulder Instructions    ? ? ?Home Living Family/patient expects to be discharged to:: Private residence ?Living Arrangements: Spouse/significant other ?Available Help at Discharge: Family ?Type of Home: House ?Home Access: Stairs to enter ?Entrance Stairs-Number of Steps: 3 ?Entrance Stairs-Rails: None ?Home Layout: One level ?  ?  ?Bathroom Shower/Tub: Walk-in shower ?  ?Bathroom Toilet: Handicapped height ?  ?  ?Home Equipment: None ?  ?  ?  ? ?  ?Prior Functioning/Environment Prior Level of Function : Independent/Modified Independent;Driving ?  ?  ?  ?  ?  ?  ?Mobility Comments: walks without AD, denies falls in past 6 months ?ADLs Comments: independent ?  ? ?  ?  ?OT Problem List: Pain ?  ?   ?OT Treatment/Interventions:    ?  ?OT Goals(Current goals can be found in the care plan section) Acute Rehab OT Goals ?OT Goal Formulation: All assessment and education complete, DC therapy  ?OT Frequency:   ?  ? ?Co-evaluation   ?  ?  ?  ?  ? ?  ?AM-PAC OT "6 Clicks" Daily Activity     ?Outcome Measure Help from another person eating meals?: None ?Help from another person taking care of personal grooming?: None ?Help from another person toileting, which includes using toliet, bedpan, or urinal?: None ?Help from another person bathing (including washing, rinsing, drying)?: None ?Help from another person to put on and taking off regular upper body clothing?: None ?Help from another person to put on and taking off regular lower body clothing?: None ?6 Click Score: 24 ?  ?End of Session Nurse Communication: Other (comment) (cleared patient for session, updated on patients status at end of session) ? ?Activity Tolerance: Patient tolerated treatment well ?Patient left: in bed;with call bell/phone within reach;with family/visitor present;with bed alarm set ? ?OT Visit Diagnosis: Unsteadiness on feet (R26.81);History of falling (Z91.81)  ?               ?Time: 5449-2010 ?OT Time Calculation (min): 18 min ?Charges:  OT General Charges ?$OT Visit: 1 Visit ?OT Evaluation ?$OT Eval Low Complexity: 1 Low ? ?Kijuana Ruppel OTR/L, MS ?Acute Rehabilitation Department ?Office# [phone] ?Pager# [phone] ? ? ?Feliz Beam Gentle Hoge ?11/27/2021, 12:06 PM ?

## 2021-11-27 NOTE — Progress Notes (Signed)
PHARMACY NOTE:  ANTIMICROBIAL RENAL DOSAGE ADJUSTMENT ? ?Current antimicrobial regimen includes a mismatch between antimicrobial dosage and estimated renal function.  As per policy approved by the Pharmacy & Therapeutics and Medical Executive Committees, the antimicrobial dosage will be adjusted accordingly. ? ?Current antimicrobial dosage:   ?- cefepime 1 gr IV q12h  ? ?Indication: complicated UTI  ? ?Renal Function: ? ?Estimated Creatinine Clearance: 38.1 mL/min (A) (by C-G formula based on SCr of 1.7 mg/dL (H)). ?'[]'$      On intermittent HD, scheduled: ?'[]'$      On CRRT ?   ?Antimicrobial dosage has been changed to:   ?- Cefepime 2 gr IV q12h  ? ?Additional comments: ? ? ?Thank you for allowing pharmacy to be a part of this patient's care. ? ? ?Royetta Asal, PharmD, BCPS ?11/27/2021 1:39 PM ? ? ? ?   ?

## 2021-11-28 DIAGNOSIS — D638 Anemia in other chronic diseases classified elsewhere: Secondary | ICD-10-CM

## 2021-11-28 DIAGNOSIS — D72829 Elevated white blood cell count, unspecified: Secondary | ICD-10-CM

## 2021-11-28 LAB — CBC WITH DIFFERENTIAL/PLATELET
Abs Immature Granulocytes: 0.1 10*3/uL — ABNORMAL HIGH (ref 0.00–0.07)
Basophils Absolute: 0 10*3/uL (ref 0.0–0.1)
Basophils Relative: 0 %
Eosinophils Absolute: 0 10*3/uL (ref 0.0–0.5)
Eosinophils Relative: 0 %
HCT: 33.6 % — ABNORMAL LOW (ref 39.0–52.0)
Hemoglobin: 10.8 g/dL — ABNORMAL LOW (ref 13.0–17.0)
Immature Granulocytes: 1 %
Lymphocytes Relative: 14 %
Lymphs Abs: 1.7 10*3/uL (ref 0.7–4.0)
MCH: 26.7 pg (ref 26.0–34.0)
MCHC: 32.1 g/dL (ref 30.0–36.0)
MCV: 83.2 fL (ref 80.0–100.0)
Monocytes Absolute: 0.8 10*3/uL (ref 0.1–1.0)
Monocytes Relative: 7 %
Neutro Abs: 9.1 10*3/uL — ABNORMAL HIGH (ref 1.7–7.7)
Neutrophils Relative %: 78 %
Platelets: 193 10*3/uL (ref 150–400)
RBC: 4.04 MIL/uL — ABNORMAL LOW (ref 4.22–5.81)
RDW: 13.7 % (ref 11.5–15.5)
WBC: 11.7 10*3/uL — ABNORMAL HIGH (ref 4.0–10.5)
nRBC: 0 % (ref 0.0–0.2)

## 2021-11-28 LAB — BASIC METABOLIC PANEL
Anion gap: 5 (ref 5–15)
BUN: 24 mg/dL — ABNORMAL HIGH (ref 8–23)
CO2: 22 mmol/L (ref 22–32)
Calcium: 8.1 mg/dL — ABNORMAL LOW (ref 8.9–10.3)
Chloride: 109 mmol/L (ref 98–111)
Creatinine, Ser: 1.67 mg/dL — ABNORMAL HIGH (ref 0.61–1.24)
GFR, Estimated: 42 mL/min — ABNORMAL LOW (ref >60)
Glucose, Bld: 101 mg/dL — ABNORMAL HIGH (ref 70–99)
Potassium: 4 mmol/L (ref 3.5–5.1)
Sodium: 136 mmol/L (ref 135–145)

## 2021-11-28 LAB — MAGNESIUM: Magnesium: 2.2 mg/dL (ref 1.7–2.4)

## 2021-11-28 MED ORDER — POLYETHYLENE GLYCOL 3350 17 G PO PACK
17.0000 g | PACK | Freq: Once | ORAL | Status: AC
  Administered 2021-11-28: 17 g via ORAL
  Filled 2021-11-28: qty 1

## 2021-11-28 NOTE — Progress Notes (Signed)
Physical Therapy Treatment ?Patient Details ?Name: Benjamin Hensley ?MRN: [id number] ?DOB: [birth year] ?Today's Date: 11/28/2021 ? ? ?History of Present Illness 81 y.o. male, with medical history of bladder cancer, umbilical hernia, prostate cancer, s/p cystoprostatectomy with no dissection and conduit diversion and open umbilical hernia repair on 09/22/2021.  Patient had urostomy placed on 09/22/2021.  Presented with abdominal pain and left lower quadrant which has been going for a week. Dx of complicated UTI. ? ?  ?PT Comments  ? ? Patient progressing well with mobility and ambulated ~300' with min guard/supervision level. Cues this session for management of IV improved pt's balance/control of AD during gait. Pt completed stair training as well with no overt LOB and verbalized safe understanding of step pattern. EOS reviewed functional sit<>stands with patient and encouraged to complete at home and mobilize with staff at least 3x/day in hallway. Will progress to HEP as pt remains in hospital in preparation for independence with exercise when pt returns home. ? ?  ?Recommendations for follow up therapy are one component of a multi-disciplinary discharge planning process, led by the attending physician.  Recommendations may be updated based on patient status, additional functional criteria and insurance authorization. ? ?Follow Up Recommendations ? No PT follow up ?  ?  ?Assistance Recommended at Discharge Set up Supervision/Assistance  ?Patient can return home with the following Assistance with cooking/housework ?  ?Equipment Recommendations ? None recommended by PT  ?  ?Recommendations for Other Services   ? ? ?  ?Precautions / Restrictions Precautions ?Precautions: Fall ?Precaution Comments: fall just prior to admission, no other falls in past 6 months ?Restrictions ?Weight Bearing Restrictions: No  ?  ? ?Mobility ? Bed Mobility ?Overal bed mobility: Modified Independent ?  ?  ?  ?  ?  ?  ?General bed mobility  comments: used rail, no assist needed ?  ? ?Transfers ?Overall transfer level: Needs assistance ?Equipment used: None ?Transfers: Sit to/from Stand ?Sit to Stand: Supervision ?  ?  ?  ?  ?  ?General transfer comment: sueprvision for safety, ?  ? ?Ambulation/Gait ?Ambulation/Gait assistance: Min guard ?Gait Distance (Feet): 300 Feet ?Assistive device: IV Pole ?Gait Pattern/deviations: Step-through pattern, Decreased stride length, Trunk flexed ?Gait velocity: decr ?  ?  ?General Gait Details: pt steady with IV pole, cues for management/hand placement on IV pole to help with steering. no LOB noted. ? ? ?Stairs ?Stairs: Yes ?Stairs assistance: Min guard ?Stair Management: One rail Left, Forwards, Alternating pattern, Step to pattern ?Number of Stairs: 5 ?General stair comments: cues for safe use of rail, pt ascending with alternating pattern and descend with step to pattern. guarding for safety and assist for line management. ? ? ?Wheelchair Mobility ?  ? ?Modified Rankin (Stroke Patients Only) ?  ? ? ?  ?Balance Overall balance assessment: History of Falls, Needs assistance ?Sitting-balance support: Feet supported, No upper extremity supported ?Sitting balance-Leahy Scale: Good ?  ?  ?Standing balance support: Single extremity supported, During functional activity ?Standing balance-Leahy Scale: Good ?  ?  ?  ?  ?  ?  ?  ?  ?  ?  ?  ?  ?  ? ?  ?Cognition Arousal/Alertness: Awake/alert ?Behavior During Therapy: [facility] for tasks assessed/performed ?Overall Cognitive Status: Within Functional Limits for tasks assessed ?  ?  ?  ?  ?  ?  ?  ?  ?  ?  ?  ?  ?  ?  ?  ?  ?  ?  ?  ? ?  ?  Exercises Other Exercises ?Other Exercises: 10 reps Sit<>Stand, no UE use ? ?  ?General Comments   ?  ?  ? ?Pertinent Vitals/Pain Pain Assessment ?Pain Assessment: 0-10 ?Pain Location: "kidneys" ?Pain Descriptors / Indicators: Aching ?Pain Intervention(s): Limited activity within patient's tolerance, Monitored during session  ? ? ?Home Living   ?  ?   ?  ?  ?  ?  ?  ?  ?  ?   ?  ?Prior Function    ?  ?  ?   ? ?PT Goals (current goals can now be found in the care plan section) Acute Rehab PT Goals ?Patient Stated Goal: return home and to independence with mobility ?PT Goal Formulation: With patient ?Time For Goal Achievement: 12/11/21 ?Potential to Achieve Goals: Good ?Progress towards PT goals: Progressing toward goals ? ?  ?Frequency ? ? ? Min 3X/week ? ? ? ?  ?PT Plan Current plan remains appropriate  ? ? ?Co-evaluation   ?  ?  ?  ?  ? ?  ?AM-PAC PT "6 Clicks" Mobility   ?Outcome Measure ? Help needed turning from your back to your side while in a flat bed without using bedrails?: None ?Help needed moving from lying on your back to sitting on the side of a flat bed without using bedrails?: None ?Help needed moving to and from a bed to a chair (including a wheelchair)?: A Little ?Help needed standing up from a chair using your arms (e.g., wheelchair or bedside chair)?: A Little ?Help needed to walk in hospital room?: A Little ?Help needed climbing 3-5 steps with a railing? : A Little ?6 Click Score: 20 ? ?  ?End of Session Equipment Utilized During Treatment: Gait belt ?Activity Tolerance: Patient tolerated treatment well;Patient limited by fatigue ?Patient left: in chair;with call bell/phone within reach;with chair alarm set ?Nurse Communication: Mobility status ?PT Visit Diagnosis: Difficulty in walking, not elsewhere classified (R26.2);Pain ?  ? ? ?Time: 0277-4128 ?PT Time Calculation (min) (ACUTE ONLY): 23 min ? ?Charges:  $Gait Training: 8-22 mins ?$Therapeutic Exercise: 8-22 mins          ?          ? ?Gwynneth Albright PT, DPT ?Acute Rehabilitation Services ?Office [phone] ?Pager [phone]  ? ? ?Jacques Navy ?11/28/2021, 12:47 PM ? ?

## 2021-11-28 NOTE — Progress Notes (Signed)
?PROGRESS NOTE ? ? ? ?Benjamin Hensley  UKG:[id number] DOB: 08-12-1941 DOA: 11/26/2021 ?PCP: Lavone Orn, MD  ? ?Brief Narrative:  ?81 y.o. male with medical history significant of bladder cancer, prostate cancer, GAD, HLD, recent hospitalization for UTI treated with IV antibiotics and subsequently discharged on oral antibiotics presented with fall and generalized weakness.  On presentation, creatinine was 1.71, WBC of 24.5 with UA positive for trace leukocyte esterase and also positive for nitrites.  CT head was negative for acute intracranial process.  He was started on IV fluids and antibiotics.  CT cervical spine showed no evidence of cervical spine fracture or subluxation.  Renal ultrasound was showed mild bilateral hydronephrosis.  He was started on IV fluids and antibiotics.  Urology was consulted. ? ?Assessment & Plan: ?  ?Possible complicated UTI ?SIRS ?Mild bilateral hydronephrosis ?Leukocytosis ?-Continue cefepime.  Follow cultures.  Urology recommended conservative management for now with antibiotics and if he does not improve rapidly, nephrostomy tube placement could be considered. ?-WBCs improving.  Monitor ? 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  Creatinine 1.71 on presentation.  Creatinine 1.67 today. ?-Continue IV fluids. ? ?Weakness ?Fall ?-From above. ?-PT recommended no PT follow-up. ?-No fractures seen on imaging on presentation ? ?Anemia of chronic disease ?-From kidney disease.  Hemoglobin stable.  Monitor ? ?Hyperlipidemia ?-Continue statin ? ?GAD ?-Continue sertraline ? ?History of prostate cancer/history of bladder cancer ?-Status post cystoprostatectomy w/ conduit diversion and urostomy ?- continue outpt follow up with urology ? ? ? ?DVT prophylaxis: Lovenox ?Code Status: Full ?Family Communication: None at bedside ?Disposition Plan: ?Status is: Inpatient: Still needs IV fluids and antibiotics ? ? ?Consultants:  Urology ? ?Procedures: None ? ?Antimicrobials: Cefepime from 11/26/2021 onwards ? ? ?Subjective: ?Patient seen and examined at bedside.  Denies worsening shortness of breath, vomiting or fever.  Still complains of intermittent flank pain. ?Objective: ?Vitals:  ? 11/27/21 0446 11/27/21 1520 11/27/21 2034 11/28/21 0453  ?BP: (!) 147/76 135/77 (!) 144/85 (!) 165/76  ?Pulse: 75 89 86 77  ?Resp: '18 20 18 18  '$ ?Temp: 97.8 ?F (36.6 ?C) 99 ?F (37.2 ?C) 98.4 ?F (36.9 ?C) 98.2 ?F (36.8 ?C)  ?TempSrc: Oral Oral Oral Oral  ?SpO2: 98% 96% 96% 97%  ?Weight:      ?Height:      ? ? ?Intake/Output Summary (Last 24 hours) at 11/28/2021 0824 ?Last data filed at 11/28/2021 0500 ?Gross per 24 hour  ?Intake 1507.49 ml  ?Output 1450 ml  ?Net 57.49 ml  ? ? ?Filed Weights  ? 11/26/21 1618  ?Weight: 80.1 kg  ? ? ?Examination: ? ?General exam: No distress.  On room air currently.   ?Respiratory system: Decreased breath sounds at bases bilaterally, no wheezing ?Cardiovascular system: Currently rate controlled; S1-S2 heard  ?gastrointestinal system: Abdomen is distended slightly; soft and nontender.  Bowel sounds are heard  ?extremities: No edema or clubbing  ?Central nervous system: Awake and alert.  No focal neurological deficits.  Moves extremities.  Skin: No obvious ecchymosis/lesions  ?psychiatry: Mood, affect and judgment are normal  ?genitourinary: Still has some left flank tenderness ? ? ? ?Data Reviewed: I have personally reviewed following labs and imaging studies ? ?CBC: ?Recent Labs  ?Lab 11/26/21 ?0857 11/27/21 ?7628 11/28/21 ?0459  ?WBC 24.5* 15.0* 11.7*  ?NEUTROABS 22.0*  --  9.1*  ?HGB 11.5* 10.5* 10.8*  ?HCT 35.4* 33.1* 33.6*  ?MCV 81.8 83.8 83.2  ?PLT 205 183 193  ? ? ?Basic  Metabolic Panel: ?Recent Labs  ?Lab 11/26/21 ?0857 11/27/21 ?9211 11/28/21 ?0459  ?NA 133* 138 136  ?K 3.8 4.1 4.0  ?CL 101 108 109  ?CO2 '23 23 22  '$ ?GLUCOSE 117* 108* 101*  ?BUN 25* 29* 24*  ?CREATININE 1.71* 1.70* 1.67*  ?CALCIUM 8.4* 8.1* 8.1*  ?MG  --   --   2.2  ? ? ?GFR: ?Estimated Creatinine Clearance: 38.8 mL/min (A) (by C-G formula based on SCr of 1.67 mg/dL (H)). ?Liver Function Tests: ?Recent Labs  ?Lab 11/26/21 ?0857 11/27/21 ?9417  ?AST 35 33  ?ALT 59* 50*  ?ALKPHOS 85 82  ?BILITOT 0.6 0.2*  ?PROT 6.7 6.1*  ?ALBUMIN 2.9* 2.6*  ? ? ?No results for input(s): LIPASE, AMYLASE in the last 168 hours. ?No results for input(s): AMMONIA in the last 168 hours. ?Coagulation Profile: ?No results for input(s): INR, PROTIME in the last 168 hours. ?Cardiac Enzymes: ?No results for input(s): CKTOTAL, CKMB, CKMBINDEX, TROPONINI in the last 168 hours. ?BNP (last 3 results) ?No results for input(s): PROBNP in the last 8760 hours. ?HbA1C: ?No results for input(s): HGBA1C in the last 72 hours. ?CBG: ?No results for input(s): GLUCAP in the last 168 hours. ?Lipid Profile: ?No results for input(s): CHOL, HDL, LDLCALC, TRIG, CHOLHDL, LDLDIRECT in the last 72 hours. ?Thyroid Function Tests: ?No results for input(s): TSH, T4TOTAL, FREET4, T3FREE, THYROIDAB in the last 72 hours. ?Anemia Panel: ?Recent Labs  ?  11/27/21 ?4081  ?TIBC 192*  ?IRON 13*  ? ? ?Sepsis Labs: ?No results for input(s): PROCALCITON, LATICACIDVEN in the last 168 hours. ? ?Recent Results (from the past 240 hour(s))  ?Resp Panel by RT-PCR (Flu A&B, Covid) Urine, Bag (ped)     Status: None  ? Collection Time: 11/26/21  8:57 AM  ? Specimen: Urine, Bag (ped); Nasopharyngeal(NP) swabs in vial transport medium  ?Result Value Ref Range Status  ? SARS Coronavirus 2 by RT PCR NEGATIVE NEGATIVE Final  ?  Comment: (NOTE) ?SARS-CoV-2 target nucleic acids are NOT DETECTED. ? ?The SARS-CoV-2 RNA is generally detectable in upper respiratory ?specimens during the acute phase of infection. The lowest ?concentration of SARS-CoV-2 viral copies this assay can detect is ?138 copies/mL. A negative result does not preclude SARS-Cov-2 ?infection and should not be used as the sole basis for treatment or ?other patient management decisions. A  negative result may occur with  ?improper specimen collection/handling, submission of specimen other ?than nasopharyngeal swab, presence of viral mutation(s) within the ?areas targeted by this assay, and inadequate number of viral ?copies(<138 copies/mL). A negative result must be combined with ?clinical observations, patient history, and epidemiological ?information. The expected result is Negative. ? ?Fact Sheet for Patients:  ?EntrepreneurPulse.com.au ? ?Fact Sheet for Healthcare Providers:  ?IncredibleEmployment.be ? ?This test is no t yet approved or cleared by the [place] FDA and  ?has been authorized for detection and/or diagnosis of SARS-CoV-2 by ?FDA under an Emergency Use Authorization (EUA). This EUA will remain  ?in effect (meaning this test can be used) for the duration of the ?COVID-19 declaration under Section 564(b)(1) of the Act, 21 ?U.S.C.section 360bbb-3(b)(1), unless the authorization is terminated  ?or revoked sooner.  ? ? ?  ? Influenza A by PCR NEGATIVE NEGATIVE Final  ? Influenza B by PCR NEGATIVE NEGATIVE Final  ?  Comment: (NOTE) ?The Xpert Xpress SARS-CoV-2/FLU/RSV plus assay is intended as an aid ?in the diagnosis of influenza from Nasopharyngeal swab specimens and ?should not be used as a sole basis for treatment. Nasal  washings and ?aspirates are unacceptable for Xpert Xpress SARS-CoV-2/FLU/RSV ?testing. ? ?Fact Sheet for Patients: ?EntrepreneurPulse.com.au ? ?Fact Sheet for Healthcare Providers: ?IncredibleEmployment.be ? ?This test is not yet approved or cleared by the [place] FDA and ?has been authorized for detection and/or diagnosis of SARS-CoV-2 by ?FDA under an Emergency Use Authorization (EUA). This EUA will remain ?in effect (meaning this test can be used) for the duration of the ?COVID-19 declaration under Section 564(b)(1) of the Act, 21 U.S.C. ?section 360bbb-3(b)(1), unless the authorization  is terminated or ?revoked. ? ?Performed at [facility], [address] ?[place], [place] [address] ?  ?Urine Culture     Status: Abnormal (Preliminary result)  ? Collection Time: 11/26/21  9:02 AM  ?

## 2021-11-29 LAB — CBC WITH DIFFERENTIAL/PLATELET
Abs Immature Granulocytes: 0.06 10*3/uL (ref 0.00–0.07)
Basophils Absolute: 0 10*3/uL (ref 0.0–0.1)
Basophils Relative: 0 %
Eosinophils Absolute: 0 10*3/uL (ref 0.0–0.5)
Eosinophils Relative: 0 %
HCT: 33.5 % — ABNORMAL LOW (ref 39.0–52.0)
Hemoglobin: 10.6 g/dL — ABNORMAL LOW (ref 13.0–17.0)
Immature Granulocytes: 1 %
Lymphocytes Relative: 18 %
Lymphs Abs: 1.8 10*3/uL (ref 0.7–4.0)
MCH: 26.2 pg (ref 26.0–34.0)
MCHC: 31.6 g/dL (ref 30.0–36.0)
MCV: 82.9 fL (ref 80.0–100.0)
Monocytes Absolute: 0.6 10*3/uL (ref 0.1–1.0)
Monocytes Relative: 7 %
Neutro Abs: 7.3 10*3/uL (ref 1.7–7.7)
Neutrophils Relative %: 74 %
Platelets: 188 10*3/uL (ref 150–400)
RBC: 4.04 MIL/uL — ABNORMAL LOW (ref 4.22–5.81)
RDW: 13.6 % (ref 11.5–15.5)
WBC: 9.9 10*3/uL (ref 4.0–10.5)
nRBC: 0 % (ref 0.0–0.2)

## 2021-11-29 LAB — URINE CULTURE: Culture: 50000 — AB

## 2021-11-29 LAB — BASIC METABOLIC PANEL
Anion gap: 6 (ref 5–15)
BUN: 23 mg/dL (ref 8–23)
CO2: 22 mmol/L (ref 22–32)
Calcium: 8.1 mg/dL — ABNORMAL LOW (ref 8.9–10.3)
Chloride: 106 mmol/L (ref 98–111)
Creatinine, Ser: 1.37 mg/dL — ABNORMAL HIGH (ref 0.61–1.24)
GFR, Estimated: 53 mL/min — ABNORMAL LOW (ref >60)
Glucose, Bld: 90 mg/dL (ref 70–99)
Potassium: 4.2 mmol/L (ref 3.5–5.1)
Sodium: 134 mmol/L — ABNORMAL LOW (ref 135–145)

## 2021-11-29 LAB — MAGNESIUM: Magnesium: 2.1 mg/dL (ref 1.7–2.4)

## 2021-11-29 MED ORDER — CEFPODOXIME PROXETIL 200 MG PO TABS: 200.0000 mg | ORAL_TABLET | Freq: Two times a day (BID) | ORAL | 0 refills | Status: AC

## 2021-11-29 NOTE — Discharge Summary (Signed)
Physician Discharge Summary  ?Benjamin Hensley MWU:[id number] DOB: 19-Nov-1940 DOA: 11/26/2021 ? ?PCP: Lavone Orn, MD ? ?Admit date: 11/26/2021 ?Discharge date: 11/29/2021 ? ?Admitted From: Home ?Disposition: Home ? ?Recommendations for Outpatient Follow-up:  ?Follow up with PCP in 1 week with repeat CBC/BMP ?Outpatient follow-up with urology ?Follow up in ED if symptoms worsen or new appear ? ? ?Home Health: No ?Equipment/Devices: None ? ?Discharge Condition: Stable ?CODE STATUS: Full ?Diet recommendation: Heart healthy ? ?Brief/Interim Summary: ?81 y.o. male with medical history significant of bladder cancer, prostate cancer, GAD, HLD, recent hospitalization for UTI treated with IV antibiotics and subsequently discharged on oral antibiotics presented with fall and generalized weakness.  On presentation, creatinine was 1.71, WBC of 24.5 with UA positive for trace leukocyte esterase and also positive for nitrites.  CT head was negative for acute intracranial process.  He was started on IV fluids and antibiotics.  CT cervical spine showed no evidence of cervical spine fracture or subluxation.  Renal ultrasound was showed mild bilateral hydronephrosis.  He was started on IV fluids and antibiotics.  Urology was consulted.  Urology recommended conservative management.  Patient's overall condition has improved during the hospitalization.  Leukocytosis has improved, AKI has also improved.  He is tolerating diet.  Urine culture is growing Enterobacter cloacae.  He will be discharged today on oral Vantin for 5 more days.  Outpatient follow-up with PCP and urology. ? ?Discharge Diagnoses:  ? ?  ?Possible complicated UTI ?SIRS ?Mild bilateral hydronephrosis ?Leukocytosis ?-Currently on cefepime.  Blood culture negative so far.  Urine culture is growing Enterobacter cloacae.  Urology recommended conservative management for now with antibiotics and if he does not improve rapidly, nephrostomy tube placement could be  considered. ?-Leukocytosis has resolved. ?-Patient feels much better and wants to go home today.  Discharge patient home on oral Vantin for 5 more days.  Outpatient follow-up with PCP and urology. ? 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  Creatinine 1.71 on presentation.  Creatinine 1.37 today. ?-Treated with IV fluids. ?-Outpatient follow-up with PCP. ? ?Mild hyponatremia ?-Outpatient follow-up ?  ?Weakness ?Fall ?-From above. ?-PT recommended no PT follow-up. ?-No fractures seen on imaging on presentation ?  ?Anemia of chronic disease ?-From kidney disease.  Hemoglobin stable.  Monitor ?  ?Hyperlipidemia ?-Continue statin ?  ?GAD ?-Continue sertraline ?  ?History of prostate cancer/history of bladder cancer ?-Status post cystoprostatectomy w/ conduit diversion and urostomy ?- continue outpt follow up with urology ?  ? ? ?Discharge Instructions ? ?Discharge Instructions   ? ? Diet - low sodium heart healthy   Complete by: As directed ?  ? Increase activity slowly   Complete by: As directed ?  ? ?  ? ?Allergies as of 11/29/2021   ?No Known Allergies ?  ? ?  ?Medication List  ?  ? ?STOP taking these medications   ? ?cephALEXin 250 MG capsule ?Commonly known as: KEFLEX ?  ?oxyCODONE-acetaminophen 5-325 MG tablet ?Commonly known as: Percocet ?  ? ?  ? ?TAKE these medications   ? ?acetaminophen 500 MG tablet ?Commonly known as: TYLENOL ?Take 1,000 mg by mouth every 6 (six) hours as needed for moderate pain or headache. ?  ?atorvastatin 10 MG tablet ?Commonly known as: LIPITOR ?Take 10 mg by mouth daily. ?  ?cefpodoxime 200 MG tablet ?Commonly known as: VANTIN ?Take 1 tablet (200 mg total) by mouth 2 (two) times daily for 5 days. ?  ?Emergen-C Vitamin C  Pack ?Take 1 Package by mouth See admin instructions. ?  ?ketoconazole 2 % shampoo ?Commonly known as: NIZORAL ?Apply 1 application. topically 2 (two) times a week. ?  ?sertraline 50 MG tablet ?Commonly known as:  ZOLOFT ?Take 50 mg by mouth daily. ?  ? ?  ? ? Follow-up Information   ? ? Lavone Orn, MD. Schedule an appointment as soon as possible for a visit in 1 week(s).   ?Specialty: Internal Medicine ?Why: Please repeat CBC/BMP ?Contact information: ?[address] ?[address] ?[place] [address] ?[phone] ? ? ?  ?  ? ? [name], MD. Schedule an appointment as soon as possible for a visit in 1 week(s).   ?Specialty: Urology ?Contact information: ?[address] ?[place] [address] ?[phone] ? ? ?  ?  ? ?  ?  ? ?  ? ?No Known Allergies ? ?Consultations: ?Urology ? ? ?Procedures/Studies: ?CT Head Wo Contrast ? ?Result Date: 11/26/2021 ?CLINICAL DATA:  Trauma, fall EXAM: CT HEAD WITHOUT CONTRAST TECHNIQUE: Contiguous axial images were obtained from the base of the skull through the vertex without intravenous contrast. RADIATION DOSE REDUCTION: This exam was performed according to the departmental dose-optimization program which includes automated exposure control, adjustment of the mA and/or kV according to patient size and/or use of iterative reconstruction technique. COMPARISON:  None. FINDINGS: Brain: No acute intracranial hemorrhage, mass effect, or herniation. No extra-axial fluid collections. No evidence of acute territorial infarct. No hydrocephalus. Mild cortical volume loss. Patchy hypodensities in the periventricular and subcortical white matter, likely secondary to chronic microvascular ischemic changes. Vascular: No hyperdense vessel or unexpected calcification. Skull: Normal. Negative for fracture or focal lesion. Sinuses/Orbits: No acute finding. Other: None. IMPRESSION: Chronic changes with no acute intracranial process identified. Electronically Signed   By: Ofilia Neas M.D.   On: 11/26/2021 10:11  ? ?CT Cervical Spine Wo Contrast ? ?Result Date: 11/26/2021 ?CLINICAL DATA:  Status post fall. EXAM: CT CERVICAL SPINE WITHOUT CONTRAST TECHNIQUE: Multidetector CT imaging of the cervical  spine was performed without intravenous contrast. Multiplanar CT image reconstructions were also generated. RADIATION DOSE REDUCTION: This exam was performed according to the departmental dose-optimization program which includes automated exposure control, adjustment of the mA and/or kV according to patient size and/or use of iterative reconstruction technique. COMPARISON:  None. FINDINGS: Alignment: Normal. Skull base and vertebrae: No acute fracture. No primary bone lesion or focal pathologic process. Soft tissues and spinal canal: No prevertebral fluid or swelling. No visible canal hematoma. Disc levels: Multilevel disc space narrowing and endplate spurring is noted at C4-5 through C6-7. Bilateral facet arthropathy is identified, left greater than right. Upper chest: Negative. Other: None IMPRESSION: 1. No evidence for cervical spine fracture or subluxation. 2. Multilevel degenerative disc disease and facet arthropathy. Electronically Signed   By: Kerby Moors M.D.   On: 11/26/2021 10:09  ? ?CT ABDOMEN PELVIS W CONTRAST ? ?Result Date: 11/14/2021 ?CLINICAL DATA:  Left lower quadrant abdominal pain. EXAM: CT ABDOMEN AND PELVIS WITH CONTRAST TECHNIQUE: Multidetector CT imaging of the abdomen and pelvis was performed using the standard protocol following bolus administration of intravenous contrast. RADIATION DOSE REDUCTION: This exam was performed according to the departmental dose-optimization program which includes automated exposure control, adjustment of the mA and/or kV according to patient size and/or use of iterative reconstruction technique. CONTRAST:  169m OMNIPAQUE IOHEXOL 300 MG/ML  SOLN COMPARISON:  CT abdomen pelvis dated April 24, 2021. FINDINGS: Lower chest: No acute abnormality. Hepatobiliary: No focal liver abnormality  is seen. No gallstones, gallbladder wall thickening, or biliary dilatation. Pancreas: Unremarkable. No pancreatic ductal dilatation or surrounding inflammatory changes. Spleen:  Normal in size without focal abnormality. Unchanged calcified granulomas. Adrenals/Urinary Tract: Adrenal glands are unremarkable. Interval cystectomy with ileal conduit. New mild bilateral hydroureteronephrosis. Promin

## 2021-11-29 NOTE — Discharge Planning (Signed)
Patient medically discharged for home. Patients new medications and prescriptions went over with patient and patients discharge instructions went over with wife and patient. Iv taken out and patient wheeled down lobby via wheelchair  ? ?Loretha Stapler RN ?

## 2021-12-04 DIAGNOSIS — N179 Acute kidney failure, unspecified: Secondary | ICD-10-CM | POA: Diagnosis not present

## 2021-12-04 DIAGNOSIS — N390 Urinary tract infection, site not specified: Secondary | ICD-10-CM | POA: Diagnosis not present

## 2021-12-04 DIAGNOSIS — R748 Abnormal levels of other serum enzymes: Secondary | ICD-10-CM | POA: Diagnosis not present

## 2021-12-05 DIAGNOSIS — N390 Urinary tract infection, site not specified: Secondary | ICD-10-CM | POA: Diagnosis not present

## 2021-12-06 DIAGNOSIS — N390 Urinary tract infection, site not specified: Secondary | ICD-10-CM | POA: Diagnosis not present

## 2021-12-06 DIAGNOSIS — R748 Abnormal levels of other serum enzymes: Secondary | ICD-10-CM | POA: Diagnosis not present

## 2021-12-07 DIAGNOSIS — R8271 Bacteriuria: Secondary | ICD-10-CM | POA: Diagnosis not present

## 2021-12-08 ENCOUNTER — Other Ambulatory Visit (HOSPITAL_COMMUNITY): Payer: Self-pay | Admitting: Urology

## 2021-12-08 DIAGNOSIS — N1330 Unspecified hydronephrosis: Secondary | ICD-10-CM

## 2021-12-13 DIAGNOSIS — H401111 Primary open-angle glaucoma, right eye, mild stage: Secondary | ICD-10-CM | POA: Diagnosis not present

## 2021-12-19 ENCOUNTER — Ambulatory Visit (HOSPITAL_COMMUNITY)
Admission: RE | Admit: 2021-12-19 | Discharge: 2021-12-19 | Disposition: A | Discharge status: Home / Self Care | Payer: Medicare Other | Source: Ambulatory Visit | Attending: Urology | Admitting: Urology

## 2021-12-19 DIAGNOSIS — N1330 Unspecified hydronephrosis: Secondary | ICD-10-CM | POA: Diagnosis not present

## 2021-12-19 MED ORDER — TECHNETIUM TC 99M MERTIATIDE
5.2000 | Freq: Once | INTRAVENOUS | Status: AC | PRN
  Administered 2021-12-19: 5.2 via INTRAVENOUS

## 2021-12-19 MED ORDER — FUROSEMIDE 10 MG/ML IJ SOLN: 38.6000 mg | Freq: Once | INTRAMUSCULAR | Status: AC

## 2021-12-19 MED ORDER — FUROSEMIDE 10 MG/ML IJ SOLN
INTRAMUSCULAR | Status: AC
  Administered 2021-12-19: 38.6 mg via INTRAVENOUS
  Filled 2021-12-19: qty 4

## 2021-12-20 DIAGNOSIS — C679 Malignant neoplasm of bladder, unspecified: Secondary | ICD-10-CM | POA: Diagnosis not present

## 2021-12-20 DIAGNOSIS — N1330 Unspecified hydronephrosis: Secondary | ICD-10-CM | POA: Diagnosis not present

## 2021-12-27 DIAGNOSIS — N1831 Chronic kidney disease, stage 3a: Secondary | ICD-10-CM | POA: Diagnosis not present

## 2021-12-27 DIAGNOSIS — D509 Iron deficiency anemia, unspecified: Secondary | ICD-10-CM | POA: Diagnosis not present

## 2021-12-27 DIAGNOSIS — Z0000 Encounter for general adult medical examination without abnormal findings: Secondary | ICD-10-CM | POA: Diagnosis not present

## 2021-12-27 DIAGNOSIS — E782 Mixed hyperlipidemia: Secondary | ICD-10-CM | POA: Diagnosis not present

## 2021-12-27 DIAGNOSIS — Z1389 Encounter for screening for other disorder: Secondary | ICD-10-CM | POA: Diagnosis not present

## 2021-12-27 DIAGNOSIS — I10 Essential (primary) hypertension: Secondary | ICD-10-CM | POA: Diagnosis not present

## 2021-12-27 DIAGNOSIS — Z8551 Personal history of malignant neoplasm of bladder: Secondary | ICD-10-CM | POA: Diagnosis not present

## 2021-12-28 DIAGNOSIS — Z936 Other artificial openings of urinary tract status: Secondary | ICD-10-CM | POA: Diagnosis not present

## 2021-12-28 DIAGNOSIS — N261 Atrophy of kidney (terminal): Secondary | ICD-10-CM | POA: Diagnosis not present

## 2021-12-28 DIAGNOSIS — N130 Hydronephrosis with ureteropelvic junction obstruction: Secondary | ICD-10-CM | POA: Diagnosis not present

## 2021-12-28 DIAGNOSIS — C673 Malignant neoplasm of anterior wall of bladder: Secondary | ICD-10-CM | POA: Diagnosis not present

## 2022-01-01 DIAGNOSIS — Z936 Other artificial openings of urinary tract status: Secondary | ICD-10-CM | POA: Diagnosis not present

## 2022-01-01 DIAGNOSIS — Z8551 Personal history of malignant neoplasm of bladder: Secondary | ICD-10-CM | POA: Diagnosis not present

## 2022-01-29 DIAGNOSIS — Z936 Other artificial openings of urinary tract status: Secondary | ICD-10-CM | POA: Diagnosis not present

## 2022-01-29 DIAGNOSIS — Z8551 Personal history of malignant neoplasm of bladder: Secondary | ICD-10-CM | POA: Diagnosis not present

## 2022-02-14 DIAGNOSIS — L853 Xerosis cutis: Secondary | ICD-10-CM | POA: Diagnosis not present

## 2022-02-14 DIAGNOSIS — D1801 Hemangioma of skin and subcutaneous tissue: Secondary | ICD-10-CM | POA: Diagnosis not present

## 2022-02-14 DIAGNOSIS — D225 Melanocytic nevi of trunk: Secondary | ICD-10-CM | POA: Diagnosis not present

## 2022-02-14 DIAGNOSIS — L821 Other seborrheic keratosis: Secondary | ICD-10-CM | POA: Diagnosis not present

## 2022-02-14 DIAGNOSIS — L2089 Other atopic dermatitis: Secondary | ICD-10-CM | POA: Diagnosis not present

## 2022-02-14 DIAGNOSIS — L720 Epidermal cyst: Secondary | ICD-10-CM | POA: Diagnosis not present

## 2022-03-01 DIAGNOSIS — Z8551 Personal history of malignant neoplasm of bladder: Secondary | ICD-10-CM | POA: Diagnosis not present

## 2022-03-01 DIAGNOSIS — Z936 Other artificial openings of urinary tract status: Secondary | ICD-10-CM | POA: Diagnosis not present

## 2022-03-05 DIAGNOSIS — Z87891 Personal history of nicotine dependence: Secondary | ICD-10-CM | POA: Diagnosis not present

## 2022-03-05 DIAGNOSIS — K559 Vascular disorder of intestine, unspecified: Secondary | ICD-10-CM | POA: Diagnosis not present

## 2022-03-05 DIAGNOSIS — R310 Gross hematuria: Secondary | ICD-10-CM | POA: Diagnosis not present

## 2022-03-05 DIAGNOSIS — K56609 Unspecified intestinal obstruction, unspecified as to partial versus complete obstruction: Secondary | ICD-10-CM | POA: Diagnosis not present

## 2022-03-05 DIAGNOSIS — K5989 Other specified functional intestinal disorders: Secondary | ICD-10-CM | POA: Diagnosis not present

## 2022-03-05 DIAGNOSIS — Z87440 Personal history of urinary (tract) infections: Secondary | ICD-10-CM | POA: Diagnosis not present

## 2022-03-05 DIAGNOSIS — N132 Hydronephrosis with renal and ureteral calculous obstruction: Secondary | ICD-10-CM | POA: Diagnosis not present

## 2022-03-05 DIAGNOSIS — R1110 Vomiting, unspecified: Secondary | ICD-10-CM | POA: Diagnosis not present

## 2022-03-05 DIAGNOSIS — R112 Nausea with vomiting, unspecified: Secondary | ICD-10-CM | POA: Diagnosis not present

## 2022-03-05 DIAGNOSIS — D72829 Elevated white blood cell count, unspecified: Secondary | ICD-10-CM | POA: Diagnosis not present

## 2022-03-05 DIAGNOSIS — Z9889 Other specified postprocedural states: Secondary | ICD-10-CM | POA: Diagnosis not present

## 2022-03-05 DIAGNOSIS — N201 Calculus of ureter: Secondary | ICD-10-CM | POA: Diagnosis not present

## 2022-03-05 DIAGNOSIS — R6889 Other general symptoms and signs: Secondary | ICD-10-CM | POA: Diagnosis not present

## 2022-03-05 DIAGNOSIS — Z8551 Personal history of malignant neoplasm of bladder: Secondary | ICD-10-CM | POA: Diagnosis not present

## 2022-03-05 DIAGNOSIS — C679 Malignant neoplasm of bladder, unspecified: Secondary | ICD-10-CM | POA: Diagnosis not present

## 2022-03-05 DIAGNOSIS — E7800 Pure hypercholesterolemia, unspecified: Secondary | ICD-10-CM | POA: Diagnosis not present

## 2022-03-05 DIAGNOSIS — K458 Other specified abdominal hernia without obstruction or gangrene: Secondary | ICD-10-CM | POA: Diagnosis not present

## 2022-03-05 DIAGNOSIS — K56699 Other intestinal obstruction unspecified as to partial versus complete obstruction: Secondary | ICD-10-CM | POA: Diagnosis not present

## 2022-03-05 DIAGNOSIS — R188 Other ascites: Secondary | ICD-10-CM | POA: Diagnosis not present

## 2022-03-05 DIAGNOSIS — N1330 Unspecified hydronephrosis: Secondary | ICD-10-CM | POA: Diagnosis not present

## 2022-03-05 DIAGNOSIS — K55019 Acute (reversible) ischemia of small intestine, extent unspecified: Secondary | ICD-10-CM | POA: Diagnosis not present

## 2022-03-05 DIAGNOSIS — K55029 Acute infarction of small intestine, extent unspecified: Secondary | ICD-10-CM | POA: Diagnosis not present

## 2022-03-05 DIAGNOSIS — Z936 Other artificial openings of urinary tract status: Secondary | ICD-10-CM | POA: Diagnosis not present

## 2022-03-05 DIAGNOSIS — R1084 Generalized abdominal pain: Secondary | ICD-10-CM | POA: Diagnosis not present

## 2022-03-05 DIAGNOSIS — K56601 Complete intestinal obstruction, unspecified as to cause: Secondary | ICD-10-CM | POA: Diagnosis not present

## 2022-03-05 DIAGNOSIS — Z906 Acquired absence of other parts of urinary tract: Secondary | ICD-10-CM | POA: Diagnosis not present

## 2022-03-05 DIAGNOSIS — Z743 Need for continuous supervision: Secondary | ICD-10-CM | POA: Diagnosis not present

## 2022-03-05 DIAGNOSIS — K922 Gastrointestinal hemorrhage, unspecified: Secondary | ICD-10-CM | POA: Diagnosis not present

## 2022-03-05 DIAGNOSIS — R110 Nausea: Secondary | ICD-10-CM | POA: Diagnosis not present

## 2022-03-05 DIAGNOSIS — R109 Unspecified abdominal pain: Secondary | ICD-10-CM | POA: Diagnosis not present

## 2022-03-19 DIAGNOSIS — Z936 Other artificial openings of urinary tract status: Secondary | ICD-10-CM | POA: Diagnosis not present

## 2022-03-19 DIAGNOSIS — N261 Atrophy of kidney (terminal): Secondary | ICD-10-CM | POA: Diagnosis not present

## 2022-03-19 DIAGNOSIS — N201 Calculus of ureter: Secondary | ICD-10-CM | POA: Diagnosis not present

## 2022-03-19 DIAGNOSIS — C673 Malignant neoplasm of anterior wall of bladder: Secondary | ICD-10-CM | POA: Diagnosis not present

## 2022-03-20 ENCOUNTER — Other Ambulatory Visit: Payer: Self-pay | Admitting: Urology

## 2022-03-23 DIAGNOSIS — I700 Atherosclerosis of aorta: Secondary | ICD-10-CM | POA: Diagnosis not present

## 2022-03-23 DIAGNOSIS — N134 Hydroureter: Secondary | ICD-10-CM | POA: Diagnosis not present

## 2022-03-23 DIAGNOSIS — N1330 Unspecified hydronephrosis: Secondary | ICD-10-CM | POA: Diagnosis not present

## 2022-03-23 DIAGNOSIS — N201 Calculus of ureter: Secondary | ICD-10-CM | POA: Diagnosis not present

## 2022-03-26 DIAGNOSIS — K55029 Acute infarction of small intestine, extent unspecified: Secondary | ICD-10-CM | POA: Diagnosis not present

## 2022-03-26 NOTE — Patient Instructions (Signed)
DUE TO COVID-19 ONLY TWO VISITORS  (aged 79 and older)  ARE ALLOWED TO COME WITH YOU AND STAY IN THE WAITING ROOM ONLY DURING PRE OP AND PROCEDURE.   **NO VISITORS ARE ALLOWED IN THE SHORT STAY AREA OR RECOVERY ROOM!!**  IF YOU WILL BE ADMITTED INTO THE HOSPITAL YOU ARE ALLOWED ONLY FOUR SUPPORT PEOPLE DURING VISITATION HOURS ONLY (7 AM -8PM)   The support person(s) must pass our screening, gel in and out, and wear a mask at all times, including in the patient's room. Patients must also wear a mask when staff or their support person are in the room. Visitors GUEST BADGE MUST BE WORN VISIBLY  One adult visitor may remain with you overnight and MUST be in the room by 8 P.M.     Your procedure is scheduled on: 03/30/22   Report to [facility] Main Entrance    Report to admitting at : 5:30 AM   Call this number if you have problems the morning of surgery [phone]   Do not eat food :After Midnight.   After Midnight you may have the following liquids until: 4:30 AM DAY OF SURGERY  Water Black Coffee (sugar ok, NO MILK/CREAM OR CREAMERS)  Tea (sugar ok, NO MILK/CREAM OR CREAMERS) regular and decaf                             Plain Jell-O (NO RED)                                           Fruit ices (not with fruit pulp, NO RED)                                     Popsicles (NO RED)                                                                  Juice: apple, WHITE grape, WHITE cranberry Sports drinks like Gatorade (NO RED) Clear broth(vegetable,chicken,beef)  FOLLOW BOWEL PREP AND ANY ADDITIONAL PRE OP INSTRUCTIONS YOU RECEIVED FROM YOUR SURGEON'S OFFICE!!!  Oral Hygiene is also important to reduce your risk of infection.                                    Remember - BRUSH YOUR TEETH THE MORNING OF SURGERY WITH YOUR REGULAR TOOTHPASTE   Do NOT smoke after Midnight   Take these medicines the morning of surgery with A SIP OF WATER: sertraline.Tylenol as needed.  DO NOT  TAKE ANY ORAL DIABETIC MEDICATIONS DAY OF YOUR SURGERY  Bring CPAP mask and tubing day of surgery.                              You may not have any metal on your body including hair pins, jewelry, and body piercing  Do not wear lotions, powders, perfumes/cologne, or deodorant              Men may shave face and neck.   Do not bring valuables to the hospital. [place].   Contacts, dentures or bridgework may not be worn into surgery.   Bring small overnight bag day of surgery.   DO NOT [place]. PHARMACY WILL DISPENSE MEDICATIONS LISTED ON YOUR MEDICATION LIST TO YOU DURING YOUR ADMISSION [place]!    Patients discharged on the day of surgery will not be allowed to drive home.  Someone NEEDS to stay with you for the first 24 hours after anesthesia.   Special Instructions: Bring a copy of your healthcare power of attorney and living will documents         the day of surgery if you haven't scanned them before.              Please read over the following fact sheets you were given: IF YOU HAVE QUESTIONS ABOUT YOUR PRE-OP INSTRUCTIONS PLEASE CALL [phone]     [facility] - Preparing for Surgery Before surgery, you can play an important role.  Because skin is not sterile, your skin needs to be as free of germs as possible.  You can reduce the number of germs on your skin by washing with CHG (chlorahexidine gluconate) soap before surgery.  CHG is an antiseptic cleaner which kills germs and bonds with the skin to continue killing germs even after washing. Please DO NOT use if you have an allergy to CHG or antibacterial soaps.  If your skin becomes reddened/irritated stop using the CHG and inform your nurse when you arrive at Short Stay. Do not shave (including legs and underarms) for at least 48 hours prior to the first CHG shower.  You may shave your face/neck. Please follow these  instructions carefully:  1.  Shower with CHG Soap the night before surgery and the  morning of Surgery.  2.  If you choose to wash your hair, wash your hair first as usual with your  normal  shampoo.  3.  After you shampoo, rinse your hair and body thoroughly to remove the  shampoo.                           4.  Use CHG as you would any other liquid soap.  You can apply chg directly  to the skin and wash                       Gently with a scrungie or clean washcloth.  5.  Apply the CHG Soap to your body ONLY FROM THE NECK DOWN.   Do not use on face/ open                           Wound or open sores. Avoid contact with eyes, ears mouth and genitals (private parts).                       Wash face,  Genitals (private parts) with your normal soap.             6.  Wash thoroughly, paying special attention to the area where your surgery  will be performed.  7.  Thoroughly rinse your body with warm water from the neck down.  8.  DO NOT shower/wash with your normal soap after using and rinsing off  the CHG Soap.                9.  Pat yourself dry with a clean towel.            10.  Wear clean pajamas.            11.  Place clean sheets on your bed the night of your first shower and do not  sleep with pets. Day of Surgery : Do not apply any lotions/deodorants the morning of surgery.  Please wear clean clothes to the hospital/surgery center.  FAILURE TO FOLLOW THESE INSTRUCTIONS MAY RESULT IN THE CANCELLATION OF YOUR SURGERY PATIENT SIGNATURE_________________________________  NURSE SIGNATURE__________________________________  ________________________________________________________________________

## 2022-03-27 ENCOUNTER — Encounter (HOSPITAL_COMMUNITY): Payer: Self-pay

## 2022-03-27 ENCOUNTER — Encounter (HOSPITAL_COMMUNITY)
Admission: RE | Admit: 2022-03-27 | Discharge: 2022-03-27 | Disposition: A | Discharge status: Home / Self Care | Payer: Medicare Other | Source: Ambulatory Visit | Attending: Urology | Admitting: Urology

## 2022-03-27 ENCOUNTER — Other Ambulatory Visit: Payer: Self-pay

## 2022-03-27 VITALS — BP 135/72 | HR 72 | Temp 97.8°F | Ht 71.0 in | Wt 175.0 lb

## 2022-03-27 DIAGNOSIS — Z01818 Encounter for other preprocedural examination: Secondary | ICD-10-CM

## 2022-03-27 DIAGNOSIS — Z01812 Encounter for preprocedural laboratory examination: Secondary | ICD-10-CM | POA: Insufficient documentation

## 2022-03-27 HISTORY — DX: Unspecified dementia, unspecified severity, without behavioral disturbance, psychotic disturbance, mood disturbance, and anxiety: F03.90

## 2022-03-27 HISTORY — DX: Personal history of urinary calculi: Z87.442

## 2022-03-27 HISTORY — DX: Chronic kidney disease, unspecified: N18.9

## 2022-03-27 LAB — CBC
HCT: 35.4 % — ABNORMAL LOW (ref 39.0–52.0)
Hemoglobin: 11.4 g/dL — ABNORMAL LOW (ref 13.0–17.0)
MCH: 27.7 pg (ref 26.0–34.0)
MCHC: 32.2 g/dL (ref 30.0–36.0)
MCV: 86.1 fL (ref 80.0–100.0)
Platelets: 171 10*3/uL (ref 150–400)
RBC: 4.11 MIL/uL — ABNORMAL LOW (ref 4.22–5.81)
RDW: 14 % (ref 11.5–15.5)
WBC: 6.8 10*3/uL (ref 4.0–10.5)
nRBC: 0 % (ref 0.0–0.2)

## 2022-03-27 NOTE — Progress Notes (Signed)
For Short Stay: [place] appointment date: Date of COVID positive in last 59 days:  Bowel Prep reminder:   For Anesthesia: PCP - Dr. Lavone Orn Cardiologist -   Chest x-ray - 04/14/21 EKG - 11/27/21 Stress Test -  ECHO -  Cardiac Cath -  Pacemaker/ICD device last checked: Pacemaker orders received: Device Rep notified:  Spinal Cord Stimulator:  Sleep Study -  CPAP -   Fasting Blood Sugar -  Checks Blood Sugar _____ times a day Date and result of last Hgb A1c-  Blood Thinner Instructions: Aspirin Instructions: Last Dose:  Activity level: Can go up a flight of stairs and activities of daily living without stopping and without chest pain and/or shortness of breath   Able to exercise without chest pain and/or shortness of breath   Unable to go up a flight of stairs without chest pain and/or shortness of breath     Anesthesia review:   Patient denies shortness of breath, fever, cough and chest pain at PAT appointment   Patient verbalized understanding of instructions that were given to them at the PAT appointment. Patient was also instructed that they will need to review over the PAT instructions again at home before surgery.

## 2022-03-30 ENCOUNTER — Encounter (HOSPITAL_COMMUNITY): Payer: Self-pay | Admitting: Urology

## 2022-03-30 ENCOUNTER — Ambulatory Visit (HOSPITAL_COMMUNITY): Payer: Medicare Other

## 2022-03-30 ENCOUNTER — Ambulatory Visit (HOSPITAL_COMMUNITY): Payer: Medicare Other | Admitting: Anesthesiology

## 2022-03-30 ENCOUNTER — Encounter (HOSPITAL_COMMUNITY)
Admission: RE | Disposition: A | Discharge status: Home Health | Payer: Self-pay | Source: Home / Self Care | Attending: Urology

## 2022-03-30 ENCOUNTER — Other Ambulatory Visit: Payer: Self-pay

## 2022-03-30 ENCOUNTER — Inpatient Hospital Stay (HOSPITAL_COMMUNITY)
Admission: RE | Admit: 2022-03-30 | Discharge: 2022-04-02 | DRG: 661 | Disposition: A | Discharge status: Home Health | Payer: Medicare Other | Attending: Urology | Admitting: Urology

## 2022-03-30 DIAGNOSIS — R32 Unspecified urinary incontinence: Secondary | ICD-10-CM | POA: Diagnosis not present

## 2022-03-30 DIAGNOSIS — N131 Hydronephrosis with ureteral stricture, not elsewhere classified: Secondary | ICD-10-CM | POA: Diagnosis not present

## 2022-03-30 DIAGNOSIS — N132 Hydronephrosis with renal and ureteral calculous obstruction: Secondary | ICD-10-CM | POA: Diagnosis not present

## 2022-03-30 DIAGNOSIS — Z8551 Personal history of malignant neoplasm of bladder: Secondary | ICD-10-CM

## 2022-03-30 DIAGNOSIS — N1330 Unspecified hydronephrosis: Secondary | ICD-10-CM | POA: Diagnosis not present

## 2022-03-30 DIAGNOSIS — N179 Acute kidney failure, unspecified: Secondary | ICD-10-CM | POA: Diagnosis not present

## 2022-03-30 DIAGNOSIS — Z87891 Personal history of nicotine dependence: Secondary | ICD-10-CM

## 2022-03-30 DIAGNOSIS — F0390 Unspecified dementia without behavioral disturbance: Secondary | ICD-10-CM | POA: Diagnosis present

## 2022-03-30 DIAGNOSIS — K8020 Calculus of gallbladder without cholecystitis without obstruction: Secondary | ICD-10-CM | POA: Diagnosis not present

## 2022-03-30 DIAGNOSIS — K6389 Other specified diseases of intestine: Secondary | ICD-10-CM | POA: Diagnosis not present

## 2022-03-30 DIAGNOSIS — N1832 Chronic kidney disease, stage 3b: Secondary | ICD-10-CM | POA: Diagnosis present

## 2022-03-30 DIAGNOSIS — N368 Other specified disorders of urethra: Secondary | ICD-10-CM | POA: Diagnosis not present

## 2022-03-30 DIAGNOSIS — N2889 Other specified disorders of kidney and ureter: Secondary | ICD-10-CM | POA: Diagnosis not present

## 2022-03-30 DIAGNOSIS — Z79899 Other long term (current) drug therapy: Secondary | ICD-10-CM | POA: Diagnosis not present

## 2022-03-30 DIAGNOSIS — Z87442 Personal history of urinary calculi: Secondary | ICD-10-CM

## 2022-03-30 DIAGNOSIS — K429 Umbilical hernia without obstruction or gangrene: Secondary | ICD-10-CM | POA: Diagnosis present

## 2022-03-30 DIAGNOSIS — Z906 Acquired absence of other parts of urinary tract: Secondary | ICD-10-CM | POA: Diagnosis not present

## 2022-03-30 DIAGNOSIS — Z8661 Personal history of infections of the central nervous system: Secondary | ICD-10-CM

## 2022-03-30 DIAGNOSIS — N200 Calculus of kidney: Secondary | ICD-10-CM

## 2022-03-30 HISTORY — PX: NEPHROLITHOTOMY: SHX5134

## 2022-03-30 LAB — CBC
HCT: 36.1 % — ABNORMAL LOW (ref 39.0–52.0)
Hemoglobin: 11.8 g/dL — ABNORMAL LOW (ref 13.0–17.0)
MCH: 27.8 pg (ref 26.0–34.0)
MCHC: 32.7 g/dL (ref 30.0–36.0)
MCV: 85.1 fL (ref 80.0–100.0)
Platelets: 146 10*3/uL — ABNORMAL LOW (ref 150–400)
RBC: 4.24 MIL/uL (ref 4.22–5.81)
RDW: 13.9 % (ref 11.5–15.5)
WBC: 7.5 10*3/uL (ref 4.0–10.5)
nRBC: 0 % (ref 0.0–0.2)

## 2022-03-30 LAB — CREATININE, SERUM
Creatinine, Ser: 2.25 mg/dL — ABNORMAL HIGH (ref 0.61–1.24)
GFR, Estimated: 29 mL/min — ABNORMAL LOW (ref >60)

## 2022-03-30 SURGERY — NEPHROLITHOTOMY PERCUTANEOUS
Anesthesia: General | Laterality: Right

## 2022-03-30 MED ORDER — HYDROMORPHONE HCL 1 MG/ML IJ SOLN
INTRAMUSCULAR | Status: AC
  Filled 2022-03-30: qty 2

## 2022-03-30 MED ORDER — LIDOCAINE 2% (20 MG/ML) 5 ML SYRINGE
INTRAMUSCULAR | Status: DC | PRN
  Administered 2022-03-30: 80 mg via INTRAVENOUS

## 2022-03-30 MED ORDER — PROPOFOL 10 MG/ML IV BOLUS
INTRAVENOUS | Status: DC | PRN
  Administered 2022-03-30: 120 mg via INTRAVENOUS

## 2022-03-30 MED ORDER — HEPARIN SODIUM (PORCINE) 5000 UNIT/ML IJ SOLN
5000.0000 [IU] | Freq: Three times a day (TID) | INTRAMUSCULAR | Status: DC
  Administered 2022-03-30 – 2022-04-02 (×8): 5000 [IU] via SUBCUTANEOUS
  Filled 2022-03-30 (×8): qty 1

## 2022-03-30 MED ORDER — SODIUM CHLORIDE 0.9 % IR SOLN
Status: DC | PRN
  Administered 2022-03-30: 6000 mL

## 2022-03-30 MED ORDER — ROCURONIUM BROMIDE 10 MG/ML (PF) SYRINGE
PREFILLED_SYRINGE | INTRAVENOUS | Status: DC | PRN
  Administered 2022-03-30: 70 mg via INTRAVENOUS

## 2022-03-30 MED ORDER — DEXAMETHASONE SODIUM PHOSPHATE 10 MG/ML IJ SOLN
INTRAMUSCULAR | Status: AC
  Filled 2022-03-30: qty 1

## 2022-03-30 MED ORDER — GENTAMICIN SULFATE 40 MG/ML IJ SOLN
5.0000 mg/kg | INTRAVENOUS | Status: DC
  Filled 2022-03-30: qty 10

## 2022-03-30 MED ORDER — OXYCODONE HCL 5 MG/5ML PO SOLN: 5.0000 mg | Freq: Once | ORAL | Status: DC | PRN

## 2022-03-30 MED ORDER — SENNOSIDES-DOCUSATE SODIUM 8.6-50 MG PO TABS
2.0000 | ORAL_TABLET | Freq: Every day | ORAL | Status: DC
Start: 2022-03-30 — End: 2022-04-02
  Administered 2022-03-30 – 2022-04-01 (×3): 2 via ORAL
  Filled 2022-03-30 (×3): qty 2

## 2022-03-30 MED ORDER — SODIUM CHLORIDE 0.9 % IV SOLN
INTRAVENOUS | Status: DC | PRN
  Administered 2022-03-30: 60 mL

## 2022-03-30 MED ORDER — OXYCODONE HCL 5 MG PO TABS
5.0000 mg | ORAL_TABLET | ORAL | Status: DC | PRN
  Administered 2022-03-30 – 2022-03-31 (×4): 5 mg via ORAL
  Filled 2022-03-30 (×4): qty 1

## 2022-03-30 MED ORDER — ONDANSETRON HCL 4 MG/2ML IJ SOLN
INTRAMUSCULAR | Status: AC
  Filled 2022-03-30: qty 2

## 2022-03-30 MED ORDER — SULFAMETHOXAZOLE-TRIMETHOPRIM 800-160 MG PO TABS: 1.0000 | ORAL_TABLET | Freq: Two times a day (BID) | ORAL | 0 refills | Status: AC

## 2022-03-30 MED ORDER — PHENYLEPHRINE 80 MCG/ML (10ML) SYRINGE FOR IV PUSH (FOR BLOOD PRESSURE SUPPORT)
PREFILLED_SYRINGE | INTRAVENOUS | Status: DC | PRN
  Administered 2022-03-30 (×4): 160 ug via INTRAVENOUS

## 2022-03-30 MED ORDER — ACETAMINOPHEN 10 MG/ML IV SOLN
1000.0000 mg | Freq: Once | INTRAVENOUS | Status: DC | PRN
  Administered 2022-03-30: 1000 mg via INTRAVENOUS

## 2022-03-30 MED ORDER — ACETAMINOPHEN 10 MG/ML IV SOLN
INTRAVENOUS | Status: AC
  Filled 2022-03-30: qty 100

## 2022-03-30 MED ORDER — ONDANSETRON HCL 4 MG/2ML IJ SOLN
INTRAMUSCULAR | Status: DC | PRN
  Administered 2022-03-30: 4 mg via INTRAVENOUS

## 2022-03-30 MED ORDER — DEXAMETHASONE SODIUM PHOSPHATE 10 MG/ML IJ SOLN
INTRAMUSCULAR | Status: DC | PRN
  Administered 2022-03-30: 10 mg via INTRAVENOUS

## 2022-03-30 MED ORDER — ONDANSETRON HCL 4 MG/2ML IJ SOLN
4.0000 mg | INTRAMUSCULAR | Status: DC | PRN
  Administered 2022-03-31: 4 mg via INTRAVENOUS
  Filled 2022-03-30: qty 2

## 2022-03-30 MED ORDER — SUGAMMADEX SODIUM 200 MG/2ML IV SOLN
INTRAVENOUS | Status: DC | PRN
  Administered 2022-03-30: 200 mg via INTRAVENOUS

## 2022-03-30 MED ORDER — LACTATED RINGERS IV SOLN: INTRAVENOUS | Status: DC

## 2022-03-30 MED ORDER — ORAL CARE MOUTH RINSE: 15.0000 mL | Freq: Once | OROMUCOSAL | Status: AC

## 2022-03-30 MED ORDER — LIDOCAINE HCL (PF) 2 % IJ SOLN
INTRAMUSCULAR | Status: AC
  Filled 2022-03-30: qty 5

## 2022-03-30 MED ORDER — ROCURONIUM BROMIDE 10 MG/ML (PF) SYRINGE
PREFILLED_SYRINGE | INTRAVENOUS | Status: AC
  Filled 2022-03-30: qty 10

## 2022-03-30 MED ORDER — GENTAMICIN SULFATE 40 MG/ML IJ SOLN
2.0000 mg/kg | INTRAVENOUS | Status: AC
  Administered 2022-03-30: 160 mg via INTRAVENOUS
  Filled 2022-03-30: qty 4

## 2022-03-30 MED ORDER — ONDANSETRON HCL 4 MG/2ML IJ SOLN: 4.0000 mg | Freq: Once | INTRAMUSCULAR | Status: DC | PRN

## 2022-03-30 MED ORDER — ATORVASTATIN CALCIUM 10 MG PO TABS
5.0000 mg | ORAL_TABLET | Freq: Every day | ORAL | Status: DC
  Administered 2022-03-30 – 2022-04-01 (×3): 5 mg via ORAL
  Filled 2022-03-30: qty 1
  Filled 2022-03-30: qty 0.5
  Filled 2022-03-30 (×2): qty 1

## 2022-03-30 MED ORDER — HYDROCODONE-ACETAMINOPHEN 5-325 MG PO TABS
1.0000 | ORAL_TABLET | Freq: Four times a day (QID) | ORAL | 0 refills | Status: AC | PRN
Start: 2022-03-30 — End: 2023-03-30

## 2022-03-30 MED ORDER — FENTANYL CITRATE (PF) 250 MCG/5ML IJ SOLN
INTRAMUSCULAR | Status: AC
  Filled 2022-03-30: qty 5

## 2022-03-30 MED ORDER — SERTRALINE HCL 50 MG PO TABS
50.0000 mg | ORAL_TABLET | Freq: Every day | ORAL | Status: DC
  Administered 2022-04-01 – 2022-04-02 (×2): 50 mg via ORAL
  Filled 2022-03-30 (×2): qty 1

## 2022-03-30 MED ORDER — FENTANYL CITRATE (PF) 250 MCG/5ML IJ SOLN
INTRAMUSCULAR | Status: DC | PRN
  Administered 2022-03-30 (×2): 50 ug via INTRAVENOUS

## 2022-03-30 MED ORDER — ACETAMINOPHEN 500 MG PO TABS
1000.0000 mg | ORAL_TABLET | Freq: Four times a day (QID) | ORAL | Status: AC
  Administered 2022-03-30 – 2022-03-31 (×3): 1000 mg via ORAL
  Filled 2022-03-30 (×3): qty 2

## 2022-03-30 MED ORDER — OXYCODONE HCL 5 MG PO TABS: 5.0000 mg | ORAL_TABLET | Freq: Once | ORAL | Status: DC | PRN

## 2022-03-30 MED ORDER — PROPOFOL 10 MG/ML IV BOLUS
INTRAVENOUS | Status: AC
Start: 2022-03-30 — End: ?
  Filled 2022-03-30: qty 20

## 2022-03-30 MED ORDER — HYDROMORPHONE HCL 1 MG/ML IJ SOLN
0.2500 mg | INTRAMUSCULAR | Status: DC | PRN
  Administered 2022-03-30 (×3): 0.5 mg via INTRAVENOUS

## 2022-03-30 MED ORDER — CHLORHEXIDINE GLUCONATE 0.12 % MT SOLN
15.0000 mL | Freq: Once | OROMUCOSAL | Status: AC
  Administered 2022-03-30: 15 mL via OROMUCOSAL

## 2022-03-30 SURGICAL SUPPLY — 80 items
AGENT HMST KT MTR STRL THRMB (HEMOSTASIS)
APL PRP STRL LF DISP 70% ISPRP (MISCELLANEOUS) ×2
APL SKNCLS STERI-STRIP NONHPOA (GAUZE/BANDAGES/DRESSINGS) ×1
BAG COUNTER SPONGE SURGICOUNT (BAG) IMPLANT
BAG DRN RND TRDRP ANRFLXCHMBR (UROLOGICAL SUPPLIES) ×1
BAG SPNG CNTER NS LX DISP (BAG)
BAG URINE DRAIN 2000ML AR STRL (UROLOGICAL SUPPLIES) ×1 IMPLANT
BAG URO CATCHER STRL LF (MISCELLANEOUS) IMPLANT
BASKET LASER NITINOL 1.9FR (BASKET) ×1 IMPLANT
BASKET ZERO TIP NITINOL 2.4FR (BASKET) IMPLANT
BENZOIN TINCTURE PRP APPL 2/3 (GAUZE/BANDAGES/DRESSINGS) ×3 IMPLANT
BLADE SURG 15 STRL LF DISP TIS (BLADE) ×2 IMPLANT
BLADE SURG 15 STRL SS (BLADE) ×2
BSKT STON RTRVL 120 1.9FR (BASKET) ×1
BSKT STON RTRVL ZERO TP 2.4FR (BASKET)
CATH FOLEY 2W COUNCIL 20FR 5CC (CATHETERS) IMPLANT
CATH FOLEY 2WAY SLVR  5CC 16FR (CATHETERS) ×2
CATH FOLEY 2WAY SLVR 5CC 16FR (CATHETERS) ×2 IMPLANT
CATH MULTI PURPOSE 16FR DRAIN (CATHETERS) IMPLANT
CATH ROBINSON RED A/P 20FR (CATHETERS) IMPLANT
CATH ULTRATHANE 14FR (CATHETERS) IMPLANT
CATH URETL OPEN END 6FR 70 (CATHETERS) IMPLANT
CATH UROLOGY TORQUE 40 (MISCELLANEOUS) ×1 IMPLANT
CATH UROLOGY TORQUE 65 (CATHETERS) ×1 IMPLANT
CATH X-FORCE N30 NEPHROSTOMY (TUBING) IMPLANT
CHLORAPREP W/TINT 26 (MISCELLANEOUS) ×6 IMPLANT
DRAPE C-ARM 42X120 X-RAY (DRAPES) ×3 IMPLANT
DRAPE LINGEMAN PERC (DRAPES) ×3 IMPLANT
DRAPE SHEET LG 3/4 BI-LAMINATE (DRAPES) IMPLANT
DRAPE SURG IRRIG POUCH 19X23 (DRAPES) ×3 IMPLANT
DRSG PAD ABDOMINAL 8X10 ST (GAUZE/BANDAGES/DRESSINGS) ×4 IMPLANT
DRSG TEGADERM 4X4.75 (GAUZE/BANDAGES/DRESSINGS) ×1 IMPLANT
DRSG TEGADERM 8X12 (GAUZE/BANDAGES/DRESSINGS) ×4 IMPLANT
GAUZE SPONGE 4X4 12PLY STRL (GAUZE/BANDAGES/DRESSINGS) ×3 IMPLANT
GLOVE SURG LX 7.5 STRW (GLOVE) ×2
GLOVE SURG LX STRL 7.5 STRW (GLOVE) ×2 IMPLANT
GOWN SRG XL LVL 4 BRTHBL STRL (GOWNS) ×2 IMPLANT
GOWN STRL NON-REIN XL LVL4 (GOWNS) ×4
GUIDEWIRE AMPLAZ .035X145 (WIRE) ×6 IMPLANT
GUIDEWIRE ANG ZIPWIRE 038X150 (WIRE) ×3 IMPLANT
GUIDEWIRE STR DUAL SENSOR (WIRE) ×1 IMPLANT
IV SET EXTENSION CATH 6 NF (IV SETS) IMPLANT
KIT BALLN UROMAX 15FX4 (MISCELLANEOUS) IMPLANT
KIT BALLN UROMAX 26 75X4 (MISCELLANEOUS) ×2
KIT BASIN OR (CUSTOM PROCEDURE TRAY) ×3 IMPLANT
KIT PROBE 340X3.4XDISP GRN (MISCELLANEOUS) IMPLANT
KIT PROBE TRILOGY 3.4X340 (MISCELLANEOUS)
KIT PROBE TRILOGY 3.9X350 (MISCELLANEOUS) IMPLANT
KIT TURNOVER KIT A (KITS) IMPLANT
LASER FIB FLEXIVA PULSE ID 365 (Laser) IMPLANT
LASER FIB FLEXIVA PULSE ID 550 (Laser) IMPLANT
LASER FIB FLEXIVA PULSE ID 910 (Laser) IMPLANT
MANIFOLD NEPTUNE II (INSTRUMENTS) ×3 IMPLANT
NDL TROCAR 18X15 ECHO (NEEDLE) IMPLANT
NDL TROCAR 18X20 (NEEDLE) IMPLANT
NEEDLE TROCAR 18X15 ECHO (NEEDLE) IMPLANT
NEEDLE TROCAR 18X20 (NEEDLE) IMPLANT
NS IRRIG 1000ML POUR BTL (IV SOLUTION) ×3 IMPLANT
PACK CYSTO (CUSTOM PROCEDURE TRAY) IMPLANT
SHEATH NAVIGATOR HD 11/13X28 (SHEATH) ×1 IMPLANT
SHEATH PEELAWAY SET 9 (SHEATH) ×1 IMPLANT
SPONGE T-LAP 4X18 ~~LOC~~+RFID (SPONGE) ×3 IMPLANT
SURGIFLO W/THROMBIN 8M KIT (HEMOSTASIS) IMPLANT
SUT SILK 2 0 30  PSL (SUTURE) ×2
SUT SILK 2 0 30 PSL (SUTURE) ×2 IMPLANT
SUT VIC AB 2-0 CT1 27 (SUTURE)
SUT VIC AB 2-0 CT1 TAPERPNT 27 (SUTURE) IMPLANT
SYR 10ML LL (SYRINGE) ×3 IMPLANT
SYR 20ML LL LF (SYRINGE) ×5 IMPLANT
SYR 50ML LL SCALE MARK (SYRINGE) ×3 IMPLANT
TOWEL OR 17X26 10 PK STRL BLUE (TOWEL DISPOSABLE) ×3 IMPLANT
TRACTIP FLEXIVA PULS ID 200XHI (Laser) IMPLANT
TRACTIP FLEXIVA PULSE ID 200 (Laser)
TRAY FOLEY MTR SLVR 16FR STAT (SET/KITS/TRAYS/PACK) ×2 IMPLANT
TUBE CONNECTING VINYL 14FR 30C (TUBING) IMPLANT
TUBING CONNECTING 10 (TUBING) ×6 IMPLANT
TUBING STONE CATCHER TRILOGY (MISCELLANEOUS) IMPLANT
TUBING UROLOGY SET (TUBING) IMPLANT
WATER STERILE IRR 1000ML POUR (IV SOLUTION) ×2 IMPLANT
WATER STERILE IRR 3000ML UROMA (IV SOLUTION) ×6 IMPLANT

## 2022-03-30 NOTE — Anesthesia Procedure Notes (Signed)
Procedure Name: Intubation Date/Time: 03/30/2022 7:36 AM  Performed by: Sharlette Dense, CRNAPre-anesthesia Checklist: Patient identified, Emergency Drugs available, Suction available and Patient being monitored Patient Re-evaluated:Patient Re-evaluated prior to induction Oxygen Delivery Method: Circle system utilized Preoxygenation: Pre-oxygenation with 100% oxygen Induction Type: IV induction Ventilation: Mask ventilation without difficulty and Oral airway inserted - appropriate to patient size Laryngoscope Size: Miller and 3 Grade View: Grade I Tube type: Oral Tube size: 8.0 mm Number of attempts: 1 Airway Equipment and Method: Stylet Placement Confirmation: ETT inserted through vocal cords under direct vision, positive ETCO2 and breath sounds checked- equal and bilateral Secured at: 23 cm Tube secured with: Tape Dental Injury: Teeth and Oropharynx as per pre-operative assessment

## 2022-03-30 NOTE — Anesthesia Postprocedure Evaluation (Signed)
Anesthesia Post Note  Patient: Benjamin Hensley  Procedure(s) Performed: NEPHROLITHOTOMY PERCUTANEOUS (Right) ANTEGRADE URETEROSCOPY DILATION OF URETERAL STRICTURES (Right)     Patient location during evaluation: PACU Anesthesia Type: General Level of consciousness: awake and alert Pain management: pain level controlled Vital Signs Assessment: post-procedure vital signs reviewed and stable Respiratory status: spontaneous breathing, nonlabored ventilation, respiratory function stable and patient connected to nasal cannula oxygen Cardiovascular status: blood pressure returned to baseline and stable Postop Assessment: no apparent nausea or vomiting Anesthetic complications: no   No notable events documented.  Last Vitals:  Vitals:   03/30/22 1000 03/30/22 1015  BP: (!) 144/78 (!) 144/70  Pulse: 64 65  Resp: 11 11  Temp: 36.4 C   SpO2: 100% 100%    Last Pain:  Vitals:   03/30/22 1015  TempSrc:   PainSc: Asleep                 Rowan Pollman S

## 2022-03-30 NOTE — Transfer of Care (Signed)
Immediate Anesthesia Transfer of Care Note  Patient: Benjamin Hensley  Procedure(s) Performed: NEPHROLITHOTOMY PERCUTANEOUS (Right) ANTEGRADE URETEROSCOPY DILATION OF URETERAL STRICTURES (Right)  Patient Location: PACU  Anesthesia Type:General  Level of Consciousness: drowsy  Airway & Oxygen Therapy: Patient Spontanous Breathing and Patient connected to face mask oxygen  Post-op Assessment: Report given to RN and Post -op Vital signs reviewed and stable  Post vital signs: Reviewed and stable  Last Vitals:  Vitals Value Taken Time  BP 151/120 03/30/22 0915  Temp    Pulse 81 03/30/22 0916  Resp 17 03/30/22 0916  SpO2 100 % 03/30/22 0916  Vitals shown include unvalidated device data.  Last Pain:  Vitals:   03/30/22 0556  TempSrc:   PainSc: 0-No pain      Patients Stated Pain Goal: 4 (18/28/83 3744)  Complications: No notable events documented.

## 2022-03-30 NOTE — Brief Op Note (Signed)
03/30/2022  9:04 AM  PATIENT:  Benjamin Hensley  81 y.o. male  PRE-OPERATIVE DIAGNOSIS:  RIGHT URETERAL / RENAL STONES  POST-OPERATIVE DIAGNOSIS:  RIGHT URETERAL / RENAL STONES  PROCEDURE:  Procedure(s) with comments: NEPHROLITHOTOMY PERCUTANEOUS (Right) - 2 HRS ANTEGRADE URETEROSCOPY DILATION OF URETERAL STRICTURES (Right)  SURGEON:  Surgeon(s) and Role:    [name], MD - Primary  PHYSICIAN ASSISTANT:   ASSISTANTS: Hinton Rao MD   ANESTHESIA:   general  EBL:  minimal   BLOOD ADMINISTERED:none  DRAINS:  urostomy to gravity; Rt nephroureteral stent capped    LOCAL MEDICATIONS USED:  NONE  SPECIMEN:  Source of Specimen:  scant Rt renal / ureteral stone  DISPOSITION OF SPECIMEN:   discard  COUNTS:  YES  TOURNIQUET:  * No tourniquets in log *  DICTATION: .Other Dictation: Dictation Number [id number]  PLAN OF CARE: Admit for overnight observation  PATIENT DISPOSITION:  PACU - hemodynamically stable.   Delay start of Pharmacological VTE agent (>24hrs) due to surgical blood loss or risk of bleeding: yes

## 2022-03-30 NOTE — Discharge Instructions (Signed)
1 - You may have bloody urine on / off with stent in place. This is normal.  2 - Call MD or go to ER for fever >102, severe pain / nausea / vomiting not relieved by medications, or acute change in medical status  

## 2022-03-30 NOTE — H&P (Signed)
Benjamin Hensley is an 81 y.o. male.    Chief Complaint: Pre-OP Right percutnatous nephrstolithotomy / antegrade ureteroscopy  HPI:   1 - Aggressive Variant Bladder Cancer - s/p robotic cystectomy with node diessection and conduit diersion 09/2021 for pTisN0Mx urothelial carcioma. Prior path rhabdoid varient.   Post-Op Surveillance:  12/2021 - CT, Renogram - no recurrence, minimal left renal function with compromised excretion.   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    3 - Conduit Diversion - s/p ileal conduit diversion with bricker type(refluxing) ureteral anastamoses 09/9620.    4 - Umbilical Hernia - fat-containing umbilical hernia incidental on CT 2022, no h/o strangulation, reapired at time of cystectomy (simple non-absobable closure) at cystectomy 2023.   5 - Bacteruria / Recurrent Peylo - expected GU colonization after cystectomy 2-23. Most recent UCX 2023 enterobacer sens cipro, gent, bactrim. Had pyelo hospitalization 11/2021.   6 - Bilateral Hydronephrosis - bilateral mild-moderate hydro on imaging x several 2023 post-cystectomy.   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 just poor function). Cr 1.9's aiwth normal K and volume status.   8 - RIGHT Ureteral Stone - 24m Rt ureteral stone per report by CT in [place] 03/2022 and Rt neph tube placed. Imaging not avail for review. FU imaging here without large stones.  9 - Small Bowel Obstruction - s/p ex-lap and [place] of 150cm small [place]   PMH sig for Rt inguinal hernia repair, TNA, pilonidal cyst surgery. He is retired from [employer] with [employer](team [place] His wife May is very involved as well. His PCP is Dr. RLouis Mattewith  ESadie Haber   Today " Benjamin Hensley is seen for opinion on above. He had ex-lap for SBO and Rt neph tube for ureteral stone in [place] few weeks ago. Imaging not avail for review. Rt neph tube remains in place. ON bactrim proph to miimize colonization, Interval CT w/o cancer recurrene / progression. Minimal stone burden.   Past Medical History:  Diagnosis Date   Bladder cancer ([place]    Chronic kidney disease    Dementia ([place]    History of kidney stones    Medical history non-contributory    Meningitis    hx of as a child affected right eye    Past Surgical History:  Procedure Laterality Date   ciliodional cyst  yrs ago   COLONOSCOPY WITH PROPOFOL N/A 02/26/2017   Procedure: COLONOSCOPY WITH PROPOFOL;  Surgeon: JGarlan Fair MD;  Location: [facility];  Service: Endoscopy;  Laterality: N/A;   colonscopy  2008   HERNIA REPAIR     LYMPH NODE DISSECTION Bilateral 09/22/2021   Procedure: LYMPH NODE DISSECTION;  Surgeon: MAlexis Frock MD;  Location: [facility];  Service: Urology;  Laterality: Bilateral;   ROBOT ASSISTED LAPAROSCOPIC COMPLETE CYSTECT ILEAL CONDUIT N/A 09/22/2021   Procedure: XI ROBOTIC ASSISTED LAPAROSCOPIC COMPLETE CYSTECT ILEAL CONDUIT WITH INDOCYANINE GREEN DYE;  Surgeon: MAlexis Frock MD;  Location: [facility];  Service: Urology;  Laterality: N/A;  6 HRS   ROBOT ASSISTED LAPAROSCOPIC RADICAL PROSTATECTOMY N/A 09/22/2021   Procedure: XI ROBOTIC ASSISTED LAPAROSCOPIC RADICAL PROSTATECTOMY;  Surgeon: MAlexis Frock MD;  Location: [facility];  Service: Urology;  Laterality: N/A;   TONSILLECTOMY  as child   TRANSURETHRAL RESECTION OF BLADDER TUMOR  WITH MITOMYCIN-C N/A 05/02/2021   Procedure: TRANSURETHRAL RESECTION OF BLADDER TUMOR WITH GEMCITABINE;  Surgeon: Irine Seal, MD;  Location: [facility];  Service: Urology;  Laterality: N/A;  GENERAL ANESTHESIA WITH PARALYSIS   UMBILICAL HERNIA REPAIR N/A 09/22/2021   Procedure: OPEN HERNIA REPAIR UMBILICAL ADULT;  Surgeon: [name], MD;   Location: [facility];  Service: Urology;  Laterality: N/A;    History reviewed. No pertinent family history. Social History:  reports that he has quit smoking. His smoking use included cigarettes. He has a 30.00 pack-year smoking history. He has never used smokeless tobacco. He reports that he does not currently use alcohol. He reports that he does not use drugs.  Allergies: No Known Allergies  Medications Prior to Admission  Medication Sig Dispense Refill   acetaminophen (TYLENOL) 500 MG tablet Take 1,000 mg by mouth every 6 (six) hours as needed for moderate pain or headache.     atorvastatin (LIPITOR) 10 MG tablet Take 5 mg by mouth at bedtime.     ketoconazole (NIZORAL) 2 % shampoo Apply 1 application. topically 2 (two) times a week.     Multiple Vitamins-Minerals (MULTIVITAMIN GUMMIES ADULT PO) Take 1 tablet by mouth daily.     sertraline (ZOLOFT) 50 MG tablet Take 50 mg by mouth daily.  3    Results for orders placed or performed during the hospital encounter of 03/30/22 (from the past 48 hour(s))  Creatinine, serum     Status: Abnormal   Collection Time: 03/30/22  6:09 AM  Result Value Ref Range   Creatinine, Ser 2.25 (H) 0.61 - 1.24 mg/dL   GFR, Estimated 29 (L) >60 mL/min    Comment: (NOTE) Calculated using the CKD-EPI Creatinine Equation (2021) Performed at [facility], [place] [address] [place], [place] [address]    No results found.  Review of Systems  Constitutional:  Negative for chills and fever.  All other systems reviewed and are negative.   Blood pressure (!) 156/73, pulse 78, temperature 97.8 F (36.6 C), temperature source Oral, resp. rate 16, height '5\' 11"'$  (1.803 m), weight 79.4 kg, SpO2 98 %. Physical Exam Vitals reviewed.  HENT:     Head: Normocephalic.  Eyes:     Pupils: Pupils are equal, round, and reactive to light.  Cardiovascular:     Rate and Rhythm: Normal rate.  Pulmonary:     Effort: Pulmonary effort is normal.  Abdominal:      General: Abdomen is flat.     Comments: Urostomy with non-foul urine.   Genitourinary:    Comments: Rt neph tube in place with non-foul urine Musculoskeletal:        General: Normal range of motion.     Cervical back: Normal range of motion.  Neurological:     General: No focal deficit present.     Mental Status: He is alert.      Assessment/Plan  Proceed as planned with RIGHT PCNL / antegrade ureteroscoy for obstructing stone in functionally dominant kidney. Risks, benefits, alternatives, expected peri-op course discussed previously and reiterated today.   [name], MD 03/30/2022, 6:53 AM

## 2022-03-30 NOTE — Op Note (Unsigned)
NAME: Benjamin Hensley, Benjamin Hensley MEDICAL RECORD NO: [id number] ACCOUNT NO: [id number] DATE OF BIRTH: 12/09/1940 FAC[facility]: [facility] LOCATION: [facility] PHYSICIAN: [name], MD  Operative Report   DATE OF PROCEDURE: 03/30/2022  PREOPERATIVE DIAGNOSES:  History of bladder cancer, right hydronephrosis, status post nephrostomy tube at referring facility.  PROCEDURE: 1.  Right antegrade nephrostogram interpretation. 2.  Right percutaneous nephrostolithotomy, stone less than 2 cm. 3.  Balloon dilation of right ureteral stricture. 4.  Placement of right nephroureteral stent.  ESTIMATED BLOOD LOSS:  Nil.  COMPLICATIONS:  None.  SPECIMEN:  Scant stone fragments for discard.  FINDINGS: 1.  No evidence of high-grade obstructing stones. 2.  Partial right distal ureteral stricture, estimate 5-French pre-dilation and 15-French post-dilation. 3.  Successful placement of right antegrade ureteral stent across the area of narrowing.  INDICATIONS:  The patient is a pleasant 81 year old man with a history of very aggressive bladder cancer, status post cystoprostatectomy last year. He has done exceptionally well from an oncologic perspective. He was in [place] several months ago where  he had an episode of abdominal pain and had a complex hospitalization there during which a right nephrostomy tube was placed for a large ureteral stone per report. He had a recent imaging prior to this which demonstrated stone free status.  He presented  for further management with a nephrostomy tube in place.  History was somewhat incongruous.   I repeated CAT scan, which showed no evidence of large stones.  No evidence of obvious tumor recurrence. I recommended path of antegrade ureteroscopy to rule out  maximally any stones or obstruction before removing the nephrostomy tube.  He presents for this today.  Informed consent was obtained and placed in medical record.  PROCEDURE IN DETAIL:  The patient being verified, procedure  being right antegrade ureteroscopy with percutaneous nephrostolithotomy was confirmed.  Procedure timeout was performed.  Intravenous antibiotics were administered.  General endotracheal  anesthesia introduced.  The patient was placed into a prone position, applying prone view, axillary rolls, chest rolls.  His area of conduit was connected to straight drain and foam padding placed around this, sequential compression devices applied and  padding of his knees and ankles.  Sterile field was created, prepped and draped the patient's entire right flank and in situ nephrostomy tube after it was capped and a percutaneous drape was applied.  Initial antegrade nephrostogram was then obtained.  Initial right antegrade nephrostogram revealed excellent placement of right nephrostomy tube into the lower mid calix.  There was no evidence of hydronephrosis.  There was contrast freely flowing from the kidney all the way to the conduit.  There was  some mild tortuosity in the distal quarter of the ureter, but again no complete obstruction. This was quite favorable.  A ZIPwire was advanced to the level of the kidney and coiled.  Nephrostomy tube removed and using a KMP catheter it was navigated to  the distal ureter and exchanged for a Super Stiff wire, over which a dual lumen peel-away sheath was introduced to the level of proximal ureter and a second ZIPwire was advanced, exchanged for a Super Stiff wire, having obtained dual Super Stiff access.   One was set aside as a safety wire over the other.  A short length ureteral access sheath was placed in the proximal ureter using continuous fluoroscopic guidance and flexible digital ureteroscopy was performed of the right ureter.  There was scant  volume tiny punctate stones, each stone less than 0.5 mm.  These were amenable to  simple basketing with the Escape basket and removed, set aside for discard.  In the distal most ureter, there was some tortuosity noted, making  angulation somewhat  difficult with the ureteroscope but no obvious tumor recurrence.  A sensor wire was able to be navigated across the distal ureter; however, there was some narrowing before it entered the conduit. This was visualized and estimated to be approximately  5-French.  No obvious tumor or stone in this area.  It was clearly felt that dilation of this will be warranted to address the narrowing and to maximally rule out any stone. The 15-French x 4cm balloon dilation apparatus was advanced in antegrade fashion  across this, inflated to a pressure of 22 atmospheres, held for 90 seconds and released.  There was a clear waist that resolved with dilation radiographically.  Following this, the ureteroscope was easily advanced across this area.  Again, there was no  evidence of obstructing stone.  No evidence of tumor recurrence and no evidence of high-grade stricture. In antegrade fashion, we were able to carefully inspect freely the ureteroenteric anastomosis which was widely patent and the proximal end of the  conduit also appeared to be widely patent and vascular and viable.  The Super Stiff was once again advanced across this. Ureteroscope was backloaded and carefully removing the sheath, it was positioned just across the lower mid calix and the entire  kidney was inspected.  There was no evidence of tumor or significant stone, this was quite favorable.  We achieved the goals of surgery today, removing the scant volume stone and ensuring no significant obstruction.  A short length KMP catheter was  advanced in antegrade fashion across the safety wire, across the ureteroenteric anastomosis acting as an antegrade stent.  This was capped and sutured in place and dressing applied.  Procedure was terminated.  The patient tolerated procedure well, no  immediate periprocedural complications.  The patient was taken to postanesthesia care in stable condition with plan for observation admission, likely  discharge home tomorrow.   SHW D: 03/30/2022 9:14:40 am T: 03/30/2022 12:36:00 pm  JOB: [id number]/ [id number]

## 2022-03-30 NOTE — Anesthesia Preprocedure Evaluation (Signed)
Anesthesia Evaluation  Patient identified by MRN, date of birth, ID band Patient awake    Reviewed: Allergy & Precautions, NPO status , Patient's Chart, lab work & pertinent test results  Airway Mallampati: II  TM Distance: >3 FB Neck ROM: Full    Dental no notable dental hx.    Pulmonary neg pulmonary ROS, former smoker,    Pulmonary exam normal breath sounds clear to auscultation       Cardiovascular negative cardio ROS Normal cardiovascular exam Rhythm:Regular Rate:Normal     Neuro/Psych Dementia negative neurological ROS     GI/Hepatic negative GI ROS, Neg liver ROS,   Endo/Other  negative endocrine ROS  Renal/GU Renal InsufficiencyRenal disease  negative genitourinary   Musculoskeletal negative musculoskeletal ROS (+)   Abdominal   Peds negative pediatric ROS (+)  Hematology negative hematology ROS (+)   Anesthesia Other Findings   Reproductive/Obstetrics negative OB ROS                             Anesthesia Physical Anesthesia Plan  ASA: 2  Anesthesia Plan: General   Post-op Pain Management:    Induction: Intravenous  PONV Risk Score and Plan: 2 and Ondansetron, Dexamethasone and Treatment may vary due to age or medical condition  Airway Management Planned: Oral ETT  Additional Equipment:   Intra-op Plan:   Post-operative Plan: Extubation in OR  Informed Consent: I have reviewed the patients History and Physical, chart, labs and discussed the procedure including the risks, benefits and alternatives for the proposed anesthesia with the patient or authorized representative who has indicated his/her understanding and acceptance.     Dental advisory given  Plan Discussed with: CRNA and Surgeon  Anesthesia Plan Comments:         Anesthesia Quick Evaluation

## 2022-03-31 ENCOUNTER — Observation Stay (HOSPITAL_COMMUNITY): Payer: Medicare Other | Admitting: Certified Registered Nurse Anesthetist

## 2022-03-31 ENCOUNTER — Observation Stay (HOSPITAL_COMMUNITY): Payer: Medicare Other

## 2022-03-31 ENCOUNTER — Encounter (HOSPITAL_COMMUNITY): Payer: Self-pay | Admitting: Urology

## 2022-03-31 ENCOUNTER — Encounter (HOSPITAL_COMMUNITY)
Admission: RE | Disposition: A | Discharge status: Home Health | Payer: Self-pay | Source: Home / Self Care | Attending: Urology

## 2022-03-31 ENCOUNTER — Other Ambulatory Visit: Payer: Self-pay

## 2022-03-31 DIAGNOSIS — N1832 Chronic kidney disease, stage 3b: Secondary | ICD-10-CM | POA: Diagnosis present

## 2022-03-31 DIAGNOSIS — N200 Calculus of kidney: Secondary | ICD-10-CM | POA: Diagnosis present

## 2022-03-31 DIAGNOSIS — N131 Hydronephrosis with ureteral stricture, not elsewhere classified: Secondary | ICD-10-CM | POA: Diagnosis present

## 2022-03-31 DIAGNOSIS — R32 Unspecified urinary incontinence: Secondary | ICD-10-CM | POA: Diagnosis not present

## 2022-03-31 DIAGNOSIS — K429 Umbilical hernia without obstruction or gangrene: Secondary | ICD-10-CM | POA: Diagnosis present

## 2022-03-31 DIAGNOSIS — N2889 Other specified disorders of kidney and ureter: Secondary | ICD-10-CM | POA: Diagnosis not present

## 2022-03-31 DIAGNOSIS — Z87891 Personal history of nicotine dependence: Secondary | ICD-10-CM | POA: Diagnosis not present

## 2022-03-31 DIAGNOSIS — Z79899 Other long term (current) drug therapy: Secondary | ICD-10-CM | POA: Diagnosis not present

## 2022-03-31 DIAGNOSIS — Z8661 Personal history of infections of the central nervous system: Secondary | ICD-10-CM | POA: Diagnosis not present

## 2022-03-31 DIAGNOSIS — N368 Other specified disorders of urethra: Secondary | ICD-10-CM | POA: Diagnosis not present

## 2022-03-31 DIAGNOSIS — F0390 Unspecified dementia without behavioral disturbance: Secondary | ICD-10-CM | POA: Diagnosis present

## 2022-03-31 DIAGNOSIS — K8020 Calculus of gallbladder without cholecystitis without obstruction: Secondary | ICD-10-CM | POA: Diagnosis not present

## 2022-03-31 DIAGNOSIS — N179 Acute kidney failure, unspecified: Secondary | ICD-10-CM | POA: Diagnosis not present

## 2022-03-31 DIAGNOSIS — Z87442 Personal history of urinary calculi: Secondary | ICD-10-CM | POA: Diagnosis not present

## 2022-03-31 DIAGNOSIS — N132 Hydronephrosis with renal and ureteral calculous obstruction: Secondary | ICD-10-CM | POA: Diagnosis present

## 2022-03-31 DIAGNOSIS — K6389 Other specified diseases of intestine: Secondary | ICD-10-CM | POA: Diagnosis not present

## 2022-03-31 DIAGNOSIS — Z8551 Personal history of malignant neoplasm of bladder: Secondary | ICD-10-CM | POA: Diagnosis not present

## 2022-03-31 HISTORY — PX: NEPHROLITHOTOMY: SHX5134

## 2022-03-31 LAB — CBC
HCT: 36.9 % — ABNORMAL LOW (ref 39.0–52.0)
Hemoglobin: 12 g/dL — ABNORMAL LOW (ref 13.0–17.0)
MCH: 27.7 pg (ref 26.0–34.0)
MCHC: 32.5 g/dL (ref 30.0–36.0)
MCV: 85.2 fL (ref 80.0–100.0)
Platelets: 163 10*3/uL (ref 150–400)
RBC: 4.33 MIL/uL (ref 4.22–5.81)
RDW: 13.8 % (ref 11.5–15.5)
WBC: 15.7 10*3/uL — ABNORMAL HIGH (ref 4.0–10.5)
nRBC: 0 % (ref 0.0–0.2)

## 2022-03-31 LAB — BASIC METABOLIC PANEL
Anion gap: 8 (ref 5–15)
BUN: 47 mg/dL — ABNORMAL HIGH (ref 8–23)
CO2: 22 mmol/L (ref 22–32)
Calcium: 9 mg/dL (ref 8.9–10.3)
Chloride: 106 mmol/L (ref 98–111)
Creatinine, Ser: 4.77 mg/dL — ABNORMAL HIGH (ref 0.61–1.24)
GFR, Estimated: 12 mL/min — ABNORMAL LOW (ref >60)
Glucose, Bld: 111 mg/dL — ABNORMAL HIGH (ref 70–99)
Potassium: 5.1 mmol/L (ref 3.5–5.1)
Sodium: 136 mmol/L (ref 135–145)

## 2022-03-31 SURGERY — NEPHROLITHOTOMY PERCUTANEOUS SECOND LOOK
Anesthesia: General | Site: Flank | Laterality: Right

## 2022-03-31 MED ORDER — ACETAMINOPHEN 500 MG PO TABS
ORAL_TABLET | ORAL | Status: AC
  Filled 2022-03-31: qty 2

## 2022-03-31 MED ORDER — PROMETHAZINE HCL 25 MG/ML IJ SOLN: 6.2500 mg | INTRAMUSCULAR | Status: DC | PRN

## 2022-03-31 MED ORDER — ROCURONIUM BROMIDE 10 MG/ML (PF) SYRINGE
PREFILLED_SYRINGE | INTRAVENOUS | Status: DC | PRN
  Administered 2022-03-31: 40 mg via INTRAVENOUS

## 2022-03-31 MED ORDER — SUGAMMADEX SODIUM 200 MG/2ML IV SOLN
INTRAVENOUS | Status: DC | PRN
  Administered 2022-03-31: 200 mg via INTRAVENOUS

## 2022-03-31 MED ORDER — LIDOCAINE HCL (PF) 2 % IJ SOLN
INTRAMUSCULAR | Status: AC
  Filled 2022-03-31: qty 5

## 2022-03-31 MED ORDER — MORPHINE SULFATE (PF) 4 MG/ML IV SOLN
4.0000 mg | INTRAVENOUS | Status: DC | PRN
  Administered 2022-03-31 – 2022-04-01 (×3): 4 mg via INTRAVENOUS
  Filled 2022-03-31 (×3): qty 1

## 2022-03-31 MED ORDER — FENTANYL CITRATE PF 50 MCG/ML IJ SOSY: 25.0000 ug | PREFILLED_SYRINGE | INTRAMUSCULAR | Status: DC | PRN

## 2022-03-31 MED ORDER — ACETAMINOPHEN 325 MG PO TABS
ORAL_TABLET | ORAL | Status: DC | PRN
  Administered 2022-03-31: 1000 mg via ORAL

## 2022-03-31 MED ORDER — IOHEXOL 300 MG/ML  SOLN
INTRAMUSCULAR | Status: DC | PRN
  Administered 2022-03-31: 60 mL

## 2022-03-31 MED ORDER — PROPOFOL 10 MG/ML IV BOLUS
INTRAVENOUS | Status: AC
  Filled 2022-03-31: qty 20

## 2022-03-31 MED ORDER — FENTANYL CITRATE (PF) 100 MCG/2ML IJ SOLN
INTRAMUSCULAR | Status: DC | PRN
  Administered 2022-03-31: 100 ug via INTRAVENOUS

## 2022-03-31 MED ORDER — ONDANSETRON HCL 4 MG/2ML IJ SOLN
INTRAMUSCULAR | Status: DC | PRN
  Administered 2022-03-31: 4 mg via INTRAVENOUS

## 2022-03-31 MED ORDER — DEXAMETHASONE SODIUM PHOSPHATE 10 MG/ML IJ SOLN
INTRAMUSCULAR | Status: DC | PRN
  Administered 2022-03-31: 8 mg via INTRAVENOUS

## 2022-03-31 MED ORDER — SODIUM CHLORIDE 0.9 % IR SOLN
Status: DC | PRN
  Administered 2022-03-31: 1000 mL

## 2022-03-31 MED ORDER — LIDOCAINE 2% (20 MG/ML) 5 ML SYRINGE
INTRAMUSCULAR | Status: DC | PRN
  Administered 2022-03-31: 60 mg via INTRAVENOUS

## 2022-03-31 MED ORDER — PHENYLEPHRINE HCL-NACL 20-0.9 MG/250ML-% IV SOLN
INTRAVENOUS | Status: DC | PRN
  Administered 2022-03-31: 40 ug/min via INTRAVENOUS

## 2022-03-31 MED ORDER — PROPOFOL 10 MG/ML IV BOLUS
INTRAVENOUS | Status: DC | PRN
  Administered 2022-03-31: 150 mg via INTRAVENOUS

## 2022-03-31 MED ORDER — PHENYLEPHRINE 80 MCG/ML (10ML) SYRINGE FOR IV PUSH (FOR BLOOD PRESSURE SUPPORT)
PREFILLED_SYRINGE | INTRAVENOUS | Status: DC | PRN
  Administered 2022-03-31 (×2): 160 ug via INTRAVENOUS

## 2022-03-31 MED ORDER — SODIUM CHLORIDE 0.9 % IV SOLN: INTRAVENOUS | Status: DC

## 2022-03-31 MED ORDER — LACTATED RINGERS IV SOLN: INTRAVENOUS | Status: DC

## 2022-03-31 MED ORDER — FENTANYL CITRATE (PF) 100 MCG/2ML IJ SOLN
INTRAMUSCULAR | Status: AC
  Filled 2022-03-31: qty 2

## 2022-03-31 MED ORDER — ROCURONIUM BROMIDE 10 MG/ML (PF) SYRINGE
PREFILLED_SYRINGE | INTRAVENOUS | Status: AC
  Filled 2022-03-31: qty 10

## 2022-03-31 MED ORDER — LACTATED RINGERS IV SOLN: INTRAVENOUS | Status: DC | PRN

## 2022-03-31 MED ORDER — SODIUM CHLORIDE 0.9 % IV SOLN
2.0000 g | INTRAVENOUS | Status: AC
Start: 2022-03-31 — End: 2022-03-31
  Filled 2022-03-31: qty 20

## 2022-03-31 MED ORDER — PHENYLEPHRINE 80 MCG/ML (10ML) SYRINGE FOR IV PUSH (FOR BLOOD PRESSURE SUPPORT)
PREFILLED_SYRINGE | INTRAVENOUS | Status: AC
  Filled 2022-03-31: qty 20

## 2022-03-31 MED ORDER — ONDANSETRON HCL 4 MG/2ML IJ SOLN
INTRAMUSCULAR | Status: AC
  Filled 2022-03-31: qty 2

## 2022-03-31 SURGICAL SUPPLY — 54 items
AGENT HMST KT MTR STRL THRMB (HEMOSTASIS)
APL SKNCLS STERI-STRIP NONHPOA (GAUZE/BANDAGES/DRESSINGS) ×4
BAG COUNTER SPONGE SURGICOUNT (BAG) IMPLANT
BAG DRN RND TRDRP ANRFLXCHMBR (UROLOGICAL SUPPLIES) ×1
BAG SPNG CNTER NS LX DISP (BAG)
BAG URINE DRAIN 2000ML AR STRL (UROLOGICAL SUPPLIES) ×3 IMPLANT
BASKET LASER NITINOL 1.9FR (BASKET) IMPLANT
BASKET ZERO TIP NITINOL 2.4FR (BASKET) IMPLANT
BENZOIN TINCTURE PRP APPL 2/3 (GAUZE/BANDAGES/DRESSINGS) ×12 IMPLANT
BLADE SURG 15 STRL LF DISP TIS (BLADE) ×2 IMPLANT
BLADE SURG 15 STRL SS (BLADE) ×2
BSKT STON RTRVL 120 1.9FR (BASKET)
BSKT STON RTRVL ZERO TP 2.4FR (BASKET)
CATH FOLEY 2W COUNCIL 20FR 5CC (CATHETERS) IMPLANT
CATH ROBINSON RED A/P 20FR (CATHETERS) IMPLANT
CATH ULTRATHANE 14FR (CATHETERS) ×1 IMPLANT
CATH UROLOGY TORQUE 40 (MISCELLANEOUS) ×1 IMPLANT
CATH X-FORCE N30 NEPHROSTOMY (TUBING) ×3 IMPLANT
DRAPE C-ARM 42X120 X-RAY (DRAPES) ×3 IMPLANT
DRAPE LINGEMAN PERC (DRAPES) ×3 IMPLANT
DRAPE SURG IRRIG POUCH 19X23 (DRAPES) ×3 IMPLANT
DRSG PAD ABDOMINAL 8X10 ST (GAUZE/BANDAGES/DRESSINGS) ×6 IMPLANT
DRSG TEGADERM 6X8 (GAUZE/BANDAGES/DRESSINGS) ×1 IMPLANT
DRSG TEGADERM 8X12 (GAUZE/BANDAGES/DRESSINGS) ×6 IMPLANT
GAUZE SPONGE 4X4 12PLY STRL (GAUZE/BANDAGES/DRESSINGS) ×3 IMPLANT
GLOVE SURG LX 7.5 STRW (GLOVE) ×1
GLOVE SURG LX STRL 7.5 STRW (GLOVE) ×2 IMPLANT
GOWN SRG XL LVL 4 BRTHBL STRL (GOWNS) ×2 IMPLANT
GOWN STRL NON-REIN XL LVL4 (GOWNS) ×2
GUIDEWIRE ANG ZIPWIRE 038X150 (WIRE) ×2 IMPLANT
GUIDEWIRE STR DUAL SENSOR (WIRE) ×2 IMPLANT
GUIDEWIRE SUPER STIFF (WIRE) ×1 IMPLANT
KIT BASIN OR (CUSTOM PROCEDURE TRAY) ×3 IMPLANT
KIT PROBE 340X3.4XDISP GRN (MISCELLANEOUS) IMPLANT
KIT PROBE TRILOGY 3.4X340 (MISCELLANEOUS)
KIT PROBE TRILOGY 3.9X350 (MISCELLANEOUS) IMPLANT
LASER FIB FLEXIVA PULSE ID 550 (Laser) IMPLANT
LASER FIB FLEXIVA PULSE ID 910 (Laser) IMPLANT
MANIFOLD NEPTUNE II (INSTRUMENTS) ×3 IMPLANT
NS IRRIG 1000ML POUR BTL (IV SOLUTION) ×3 IMPLANT
PACK BASIC VI WITH GOWN DISP (CUSTOM PROCEDURE TRAY) ×3 IMPLANT
PENCIL SMOKE EVACUATOR (MISCELLANEOUS) IMPLANT
SHEATH COOK PEEL AWAY SET 9F (SHEATH) ×1 IMPLANT
SPONGE T-LAP 4X18 ~~LOC~~+RFID (SPONGE) ×3 IMPLANT
SURGIFLO W/THROMBIN 8M KIT (HEMOSTASIS) IMPLANT
SUT SILK 2 0 30  PSL (SUTURE)
SUT SILK 2 0 30 PSL (SUTURE) IMPLANT
SUT VIC AB 0 CT2 27 (SUTURE) IMPLANT
SYR 10ML LL (SYRINGE) ×3 IMPLANT
SYR 20ML LL LF (SYRINGE) ×6 IMPLANT
TOWEL OR NON WOVEN STRL DISP B (DISPOSABLE) ×3 IMPLANT
TRAY FOLEY MTR SLVR 16FR STAT (SET/KITS/TRAYS/PACK) ×3 IMPLANT
TUBING CONNECTING 10 (TUBING) ×9 IMPLANT
TUBING UROLOGY SET (TUBING) ×3 IMPLANT

## 2022-03-31 NOTE — Progress Notes (Signed)
Urology Progress Note   1 Day Post-Op from right PCNL.   Subjective: NAEON. Right sided flank pain this AM with small amount of swelling around kumpe catheter. Afebrile and HDS  Objective: Vital signs in last 24 hours: Temp:  [97.6 F (36.4 C)-98.5 F (36.9 C)] 98.2 F (36.8 C) (07/22 0448) Pulse Rate:  [59-83] 64 (07/22 0448) Resp:  [8-20] 19 (07/22 0448) BP: (127-163)/(56-120) 139/66 (07/22 0448) SpO2:  [93 %-100 %] 98 % (07/22 0448)  Intake/Output from previous day: 07/21 0701 - 07/22 0700 In: 2200.8 [P.O.:100; I.V.:1996.8; IV Piggyback:104] Out: 275 [Urine:275] Intake/Output this shift: Total I/O In: 996.8 [P.O.:100; I.V.:896.8] Out: 75 [Urine:75]  Physical Exam:  General: Alert and oriented CV: Regular rate Lungs: No increased work of breathing Abdomen:  Soft, appropriately tender. Incisions c/d/i. JP SS. GU: conduit draining clear yellow urine  Ext: NT, No erythema  Lab Results: Recent Labs    03/30/22 1416  HGB 11.8*  HCT 36.1*   Recent Labs    03/30/22 0609  CREATININE 2.25*    Studies/Results: DG C-Arm 1-60 Min-No Report  Result Date: 03/30/2022 Fluoroscopy was utilized by the requesting physician.  No radiographic interpretation.    Assessment/Plan:  81 y.o. male s/p right pcnl.  Overall doing well post-op, working on pain control   - regular diet - continue to observe for pain control -   Dispo: floor   LOS: 0 days

## 2022-03-31 NOTE — Anesthesia Preprocedure Evaluation (Addendum)
Anesthesia Evaluation  Patient identified by MRN, date of birth, ID band Patient awake    Reviewed: Allergy & Precautions, NPO status , Patient's Chart, lab work & pertinent test results  Airway Mallampati: II  TM Distance: >3 FB Neck ROM: Full    Dental no notable dental hx.    Pulmonary former smoker,    Pulmonary exam normal breath sounds clear to auscultation       Cardiovascular negative cardio ROS Normal cardiovascular exam Rhythm:Regular Rate:Normal     Neuro/Psych Dementia negative neurological ROS     GI/Hepatic negative GI ROS, Neg liver ROS,   Endo/Other  negative endocrine ROS  Renal/GU Renal InsufficiencyRenal disease  negative genitourinary   Musculoskeletal negative musculoskeletal ROS (+)   Abdominal   Peds negative pediatric ROS (+)  Hematology negative hematology ROS (+)   Anesthesia Other Findings   Reproductive/Obstetrics negative OB ROS                             Anesthesia Physical  Anesthesia Plan  ASA: 2  Anesthesia Plan: General   Post-op Pain Management: Tylenol PO (pre-op)*   Induction: Intravenous  PONV Risk Score and Plan: 2 and Ondansetron, Dexamethasone and Treatment may vary due to age or medical condition  Airway Management Planned: Oral ETT  Additional Equipment:   Intra-op Plan:   Post-operative Plan: Extubation in OR  Informed Consent: I have reviewed the patients History and Physical, chart, labs and discussed the procedure including the risks, benefits and alternatives for the proposed anesthesia with the patient or authorized representative who has indicated his/her understanding and acceptance.     Dental advisory given  Plan Discussed with: CRNA and Surgeon  Anesthesia Plan Comments:         Anesthesia Quick Evaluation

## 2022-03-31 NOTE — OR Nursing (Signed)
14FR MULTIPURPOSE DRAINAGE CATHETER PLACED ON RT FLANK AS NEPHROSTOMY TUBE AND CONNECTED TO FOLEY BAG

## 2022-03-31 NOTE — Transfer of Care (Signed)
Immediate Anesthesia Transfer of Care Note  Patient: Benjamin Hensley  Procedure(s) Performed: RIGHT ANTEGRADE NEPHROGRAM; NEPHROSTOMY TUBE PLACEMENT (Right: Flank)  Patient Location: PACU  Anesthesia Type:General  Level of Consciousness: awake and alert   Airway & Oxygen Therapy: Patient Spontanous Breathing  Post-op Assessment: Report given to RN  Post vital signs: Reviewed  Last Vitals:  Vitals Value Taken Time  BP 140/80 03/31/22 1715  Temp 36.7 C 03/31/22 1700  Pulse 100 03/31/22 1718  Resp 15 03/31/22 1718  SpO2 98 % 03/31/22 1718  Vitals shown include unvalidated device data.  Last Pain:  Vitals:   03/31/22 1715  TempSrc:   PainSc: 0-No pain      Patients Stated Pain Goal: 5 (89/16/94 5038)  Complications: No notable events documented.

## 2022-03-31 NOTE — OR Nursing (Signed)
UROLOGY TORQUE CATHETER INSERTED RIGHT FLANK REF [id number] LOT [id number] EXP 10-28-2023

## 2022-03-31 NOTE — Brief Op Note (Signed)
03/30/2022 - 03/31/2022  4:25 PM  PATIENT:  Benjamin Hensley  81 y.o. male  PRE-OPERATIVE DIAGNOSIS:  URINE LEAK  POST-OPERATIVE DIAGNOSIS:  * No post-op diagnosis entered *  PROCEDURE:  Procedure(s): RIGHT ANTEGRADE NEPHROGRAM; NEPHROSTOMY TUBE PLACEMENT (Right)  SURGEON:  Surgeon(s) and Role:    * [name], MD - Primary  PHYSICIAN ASSISTANT:   ASSISTANTS: none   ANESTHESIA:   general  EBL:  minimal   BLOOD ADMINISTERED:none  DRAINS:  Rt nephrostomy to gravity; Rt nephroureteral stent capped; Urostomy to gravity    LOCAL MEDICATIONS USED:  NONE  SPECIMEN:  No Specimen  DISPOSITION OF SPECIMEN:  N/A  COUNTS:  YES  TOURNIQUET:  * No tourniquets in log *  DICTATION: .Other Dictation: Dictation Number [id number]  PLAN OF CARE: Admit to inpatient   PATIENT DISPOSITION:  PACU - hemodynamically stable.   Delay start of Pharmacological VTE agent (>24hrs) due to surgical blood loss or risk of bleeding: yes

## 2022-03-31 NOTE — Progress Notes (Signed)
Patient arrived back to room 1428.  Patient arousable but drifts back to sleep quickly.  VS checked.  Patient's son and wife at bedside.  Angie Fava, RN

## 2022-03-31 NOTE — Anesthesia Procedure Notes (Signed)
Procedure Name: Intubation Date/Time: 03/31/2022 3:41 PM  Performed by: Montel Clock, CRNAPre-anesthesia Checklist: Patient identified, Emergency Drugs available, Suction available, Patient being monitored and Timeout performed Patient Re-evaluated:Patient Re-evaluated prior to induction Oxygen Delivery Method: Circle system utilized Preoxygenation: Pre-oxygenation with 100% oxygen Induction Type: IV induction Ventilation: Mask ventilation without difficulty and Two handed mask ventilation required Laryngoscope Size: Mac and 3 Grade View: Grade I Tube type: Oral Tube size: 7.5 mm Number of attempts: 1 Airway Equipment and Method: Stylet Placement Confirmation: ETT inserted through vocal cords under direct vision, positive ETCO2 and breath sounds checked- equal and bilateral Secured at: 23 cm Tube secured with: Tape Dental Injury: Teeth and Oropharynx as per pre-operative assessment

## 2022-03-31 NOTE — Progress Notes (Signed)
POD 1 s/p Right ureteral stricture dilation / removal small volume stone with Rt flank pain and worsening Cr, low UOP.  CT with aparant urinoma. No fevers. Rt nephroureteral stent in expected position.  Rec OR for placement of Rt nephrostomy / nephrostogram along side nephroureteral stent. Will keep in place few weeks, nephrostogram to verify no leak, before removal, then nephroureteral stent after that is tentative plan.  Pt, wife updated, Dian Situ them picure of current drains, urinoma, plan. They are in agreement to proceed.  Rocephin now for proph.  Greatly appreciate Dr. Gloriann Loan and [facility] care and prompt imaging today.

## 2022-03-31 NOTE — Progress Notes (Signed)
Nightshift RN reported ~100 cc urine output (from urostomy) throughout night.  Patient received IVF at 75 mL/hr throughout the night.  MD notified.  Angie Fava, RN

## 2022-04-01 LAB — BASIC METABOLIC PANEL
Anion gap: 7 (ref 5–15)
BUN: 34 mg/dL — ABNORMAL HIGH (ref 8–23)
CO2: 23 mmol/L (ref 22–32)
Calcium: 8.7 mg/dL — ABNORMAL LOW (ref 8.9–10.3)
Chloride: 106 mmol/L (ref 98–111)
Creatinine, Ser: 3.39 mg/dL — ABNORMAL HIGH (ref 0.61–1.24)
GFR, Estimated: 18 mL/min — ABNORMAL LOW (ref >60)
Glucose, Bld: 133 mg/dL — ABNORMAL HIGH (ref 70–99)
Potassium: 4.9 mmol/L (ref 3.5–5.1)
Sodium: 136 mmol/L (ref 135–145)

## 2022-04-01 NOTE — Progress Notes (Signed)
Urology Progress Note   1 Day Post-Op from right PCNL.   Subjective: Ct scan from yesterday with free fluid, concerning for possible urine leak. Taken back to OR for nephrostomy tube placement. Antegrade pyelogram without obvious leak. Successful neph tube placement. Feeling much better this AM, creatinine downtrending, urine output excellent.  Objective: Vital signs in last 24 hours: Temp:  [97.6 F (36.4 C)-100 F (37.8 C)] 99.3 F (37.4 C) (07/23 0825) Pulse Rate:  [87-105] 98 (07/23 0825) Resp:  [13-18] 14 (07/23 0825) BP: (124-159)/(68-87) 124/69 (07/23 0825) SpO2:  [95 %-100 %] 95 % (07/23 0825)  Intake/Output from previous day: 07/22 0701 - 07/23 0700 In: 2897.3 [P.O.:480; I.V.:2417.3] Out: 1957 [Urine:1955; Blood:2] Intake/Output this shift: Total I/O In: 240 [P.O.:240] Out: -   Physical Exam:  General: Alert and oriented CV: Regular rate Lungs: No increased work of breathing Abdomen:  Soft, appropriately tender. Incisions c/d/i. JP SS. GU: right nephrostomy tube draining pink tinged urine Ext: NT, No erythema  Lab Results: Recent Labs    03/30/22 1416 03/31/22 0756  HGB 11.8* 12.0*  HCT 36.1* 36.9*    Recent Labs    03/31/22 0756 04/01/22 0830  NA 136 136  K 5.1 4.9  CL 106 106  CO2 22 23  GLUCOSE 111* 133*  BUN 47* 34*  CREATININE 4.77* 3.39*  CALCIUM 9.0 8.7*     Studies/Results: DG C-Arm 1-60 Min-No Report  Result Date: 03/31/2022 Fluoroscopy was utilized by the requesting physician.  No radiographic interpretation.   CT RENAL STONE STUDY  Result Date: 03/31/2022 CLINICAL DATA:  Nephrolithiasis. 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 EXAM: CT ABDOMEN AND PELVIS WITHOUT CONTRAST TECHNIQUE: Multidetector CT imaging of the abdomen and pelvis was performed following the standard protocol without IV contrast. RADIATION DOSE REDUCTION: This exam was performed according to the departmental  dose-optimization program which includes automated exposure control, adjustment of the mA and/or kV according to patient size and/or use of iterative reconstruction technique. COMPARISON:  03/23/2022 and 11/14/2021 FINDINGS: Lower chest: Stable tiny amount of pericardial fluid is present. Minimal calcified plaque over the descending thoracic aorta. Few small calcified mediastinal/right hilar lymph nodes. Bibasilar atelectasis. Possible small amount right pleural fluid. Hepatobiliary: Few small hepatic granulomas. 2 cm tumefactive sludge versus noncalcified gallstone. Biliary tree is within normal. Pancreas: Normal. Spleen: Multiple calcified splenic granulomas. Adrenals/Urinary Tract: Adrenal glands are normal. 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 Kidneys are normal in size. Stable moderate dilatation of the left intrarenal collecting system and ureter. Interval exchange of percutaneous right nephrostomy tube which now extends through the renal pelvis and throughout the course of the right ureter to the level of the ileostomy over the anterior right abdomen. Few tiny flecks of air adjacent the right UPJ which may be extraluminal, although there was more mild-to-moderate air within the right intrarenal collecting system and proximal ureter on the prior exam likely procedure related. Mild prominence of the right intrarenal collecting system which is slightly worse. Moderate new right perinephric fluid as well as mild free fluid over the right abdomen and pelvis which is new. Fluid may be partially due to hemorrhagic debris. Peritonitis is also possible. These findings may be related to patient's recent percutaneous nephrostomy tube change. Stomach/Bowel: Stomach is normal. Minimal wall thickening of the mid to distal duodenum. Small bowel as described previously with ileostomy over the right abdomen as the small bowel is otherwise  unremarkable. Appendix is normal. Colon is unremarkable. Vascular/Lymphatic: Minimal calcified plaque over the abdominal aorta which is normal in caliber. No adenopathy. Reproductive: Previous prostatectomy. Other: Surgical changes over the anterior midline abdominal wall. Musculoskeletal: No acute findings. IMPRESSION: 1. 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 Interval exchange of percutaneous right nephrostomy tube which now extends through the renal pelvis and throughout the course of the right ureter to the level of the ileostomy. Few tiny flecks of air adjacent the right UPJ which may be extraluminal, although there was more mild-to-moderate air within the right intrarenal collecting system on the recent prior exam likely procedure related. Moderate right perinephric fluid as well as moderate free fluid over the right abdomen/pelvis which is new. Fluid may be partially due to hemorrhagic debris related to recent procedure. Peritonitis is also possible. 2. Stable moderate dilatation of the left intrarenal collecting system and ureter with stable to slight worsening dilatation of the right intrarenal collecting system. 3. 2 cm tumefactive sludge versus noncalcified gallstone. 4. Aortic atherosclerosis. Aortic Atherosclerosis (ICD10-I70.0). Electronically Signed   By: Marin Olp M.D.   On: 03/31/2022 08:55    Assessment/Plan:  81 y.o. male s/p right pcnl.  Overall doing well post-op, working on pain control   - regular diet - Keep additional day for observation and repeat AM labs - PT consult for help with ambulation  Dispo: floor   LOS: 1 day

## 2022-04-02 ENCOUNTER — Encounter (HOSPITAL_COMMUNITY): Payer: Self-pay | Admitting: Urology

## 2022-04-02 DIAGNOSIS — N1330 Unspecified hydronephrosis: Secondary | ICD-10-CM | POA: Diagnosis present

## 2022-04-02 LAB — BASIC METABOLIC PANEL
Anion gap: 4 — ABNORMAL LOW (ref 5–15)
BUN: 30 mg/dL — ABNORMAL HIGH (ref 8–23)
CO2: 27 mmol/L (ref 22–32)
Calcium: 8.4 mg/dL — ABNORMAL LOW (ref 8.9–10.3)
Chloride: 105 mmol/L (ref 98–111)
Creatinine, Ser: 2.49 mg/dL — ABNORMAL HIGH (ref 0.61–1.24)
GFR, Estimated: 26 mL/min — ABNORMAL LOW (ref >60)
Glucose, Bld: 94 mg/dL (ref 70–99)
Potassium: 4.3 mmol/L (ref 3.5–5.1)
Sodium: 136 mmol/L (ref 135–145)

## 2022-04-02 NOTE — TOC Transition Note (Signed)
Transition of Care [facility]) - CM/SW Discharge Note   Patient Details  Name: Benjamin Hensley MRN: [id number] Date of Birth: 02/22/1941  Transition of Care [facility]) CM/SW Contact:  Leeroy Cha, RN Phone Number: 04/02/2022, 9:46 AM   Clinical Narrative:     Transition of Care [facility]) Screening Note   Patient Details  Name: Benjamin Hensley Date of Birth: 16-Jan-1941   Transition of Care [facility]) CM/SW Contact:    Leeroy Cha, RN Phone Number: 04/02/2022, 9:46 AM    Transition of Care Department [facility]) has reviewed patient and no TOC needs have been identified at this time. We will continue to monitor patient advancement through interdisciplinary progression rounds. If new patient transition needs arise, please place a TOC consult.     Final next level of care: Home/Self Care Barriers to Discharge: No Barriers Identified   Patient Goals and CMS Choice Patient states their goals for this hospitalization and ongoing recovery are:: to return home      Discharge Placement                       Discharge Plan and Services   Discharge Planning Services: CM Consult                                 Social Determinants of Health (SDOH) Interventions     Readmission Risk Interventions     No data to display

## 2022-04-02 NOTE — Discharge Summary (Signed)
[facility] Discharge Summary  Admit date: 03/30/2022  Discharge date and time: 04/02/22   Discharge to: Home  Discharge Service: Urology  Discharge Attending Physician:  Dr. Tresa Moore  Discharge  Diagnoses: Kidney stone  Secondary Diagnosis: Principal Problem:   Kidney stone   OR Procedures: Procedure(s): RIGHT ANTEGRADE NEPHROGRAM; NEPHROSTOMY TUBE PLACEMENT 03/31/2022   Ancillary Procedures: None   Discharge Day Services: The patient was seen and examined by the Urology team both in the morning and immediately prior to discharge.  Vital signs and laboratory values were stable and within normal limits.  The physical exam was benign and unchanged and all surgical wounds were examined.  Discharge instructions were explained and all questions answered.  Subjective  No acute events overnight. Pain Controlled. No fever or chills.  Objective Patient Vitals for the past 8 hrs:  BP Temp Temp src Pulse Resp SpO2  04/02/22 0450 128/66 98.7 F (37.1 C) Oral 78 15 96 %   No intake/output data recorded.  General Appearance:        No acute distress Lungs:                       Normal work of breathing on room air Heart:                                Regular rate and rhythm Abdomen:                         Soft, non-tender, non-distended Extremities:                      Warm and well perfused   Hospital Course:  The patient underwent right PCNL on 03/31/2022.  The patient tolerated the procedure well, was extubated in the OR, and afterwards was taken to the PACU for routine post-surgical care. Post operatively he developed pain out of proportion to exma and an AKI. CT was obtained showing free fluid concerning for possible urine leak. He was taken back to the OR for right ureteral stent placement and right nephrostomy tube placement. Antegrade nephrostogram did not demonstrate any clear source of leak. He tolerated the procedure well. His pain improved and his creatinine continued to  downtrend. The patient's diet was slowly advanced and at the time of discharge was tolerating a regular diet.  The patient was discharged home 2 Days Post-Op, at which point was tolerating a regular solid diet, was able to void spontaneously, have adequate pain control with P.O. pain medication, and could ambulate without difficulty. The patient will follow up with [place] for post op check and repeat imaging prior to nephrostomy tube removal.  Condition at Discharge: Improved  Discharge Medications:  Allergies as of 04/02/2022   No Known Allergies      Medication List     TAKE these medications    acetaminophen 500 MG tablet Commonly known as: TYLENOL Take 1,000 mg by mouth every 6 (six) hours as needed for moderate pain or headache.   atorvastatin 10 MG tablet Commonly known as: LIPITOR Take 5 mg by mouth at bedtime.   HYDROcodone-acetaminophen 5-325 MG tablet Commonly known as: NORCO/VICODIN Take 1 tablet by mouth every 6 (six) hours as needed for moderate pain or severe pain. Post-operatively.   ketoconazole 2 % shampoo Commonly known as: NIZORAL Apply 1 application. topically 2 (two) times a week.  MULTIVITAMIN GUMMIES ADULT PO Take 1 tablet by mouth daily.   sertraline 50 MG tablet Commonly known as: ZOLOFT Take 50 mg by mouth daily.   sulfamethoxazole-trimethoprim 800-160 MG tablet Commonly known as: BACTRIM DS Take 1 tablet by mouth 2 (two) times daily for 3 days. Please START this prescription 1 day before your follow-up appointment with Dr. Tresa Moore

## 2022-04-02 NOTE — Anesthesia Postprocedure Evaluation (Signed)
Anesthesia Post Note  Patient: Benjamin Hensley  Procedure(s) Performed: RIGHT ANTEGRADE NEPHROGRAM; NEPHROSTOMY TUBE PLACEMENT (Right: Flank)     Patient location during evaluation: PACU Anesthesia Type: General Level of consciousness: awake and alert Pain management: pain level controlled Vital Signs Assessment: post-procedure vital signs reviewed and stable Respiratory status: spontaneous breathing, nonlabored ventilation, respiratory function stable and patient connected to nasal cannula oxygen Cardiovascular status: blood pressure returned to baseline and stable Postop Assessment: no apparent nausea or vomiting Anesthetic complications: no   No notable events documented.  Last Vitals:  Vitals:   04/01/22 2045 04/02/22 0450  BP: 134/66 128/66  Pulse: 94 78  Resp: 19 15  Temp: 37.5 C 37.1 C  SpO2: 94% 96%    Last Pain:  Vitals:   04/02/22 0450  TempSrc: Oral  PainSc:                  [name]

## 2022-04-02 NOTE — Evaluation (Signed)
Physical Therapy Evaluation Patient Details Name: Benjamin Hensley MRN: [id number] DOB: 02-16-1941 Today's Date: 04/02/2022  History of Present Illness  81 yo male with PMH of bladder cancer, hernia, prostate cancer, s/P cystoprostatectomy, S/P RIGHT Ureteral Stone - 35m Rt ureteral stone per report by CT in [place] 03/2022 and Rt neph tube placed  . Presented 03/30/22 for ANTEGRADE URETEROSCOPY DILATION OF URETERAL STRICTURES (Right), 7/22 NEPHROSTOMY TUBE PLACEMENT (Right  Clinical Impression  The  patient does present with feeling weaker. PTA did not use AD. Required support of RW. Recommend RW and HHPT. Patient has DC orders.  Wife will borrow a RW. Patient to DC today.      Recommendations for follow up therapy are one component of a multi-disciplinary discharge planning process, led by the attending physician.  Recommendations may be updated based on patient status, additional functional criteria and insurance authorization.  Follow Up Recommendations Home health PT      Assistance Recommended at Discharge Frequent or constant Supervision/Assistance  Patient can return home with the following  A little help with walking and/or transfers;A little help with bathing/dressing/bathroom;Help with stairs or ramp for entrance;Assistance with cooking/housework;Assist for transportation    Equipment Recommendations  (wife to borrow RW)  Recommendations for Other Services       Functional Status Assessment Patient has had a recent decline in their functional status and demonstrates the ability to make significant improvements in function in a reasonable and predictable amount of time.     Precautions / Restrictions Precautions Precautions: Fall Precaution Comments: urostemy and drain Restrictions Weight Bearing Restrictions: No      Mobility  Bed Mobility Overal bed mobility: Needs Assistance Bed Mobility: Supine to Sit     Supine to sit: Supervision, HOB elevated     General  bed mobility comments: extra time    Transfers Overall transfer level: Needs assistance Equipment used: Rolling walker (2 wheels) Transfers: Sit to/from Stand Sit to Stand: Min guard, From elevated surface           General transfer comment: extra time, stated" I am aftraid", stood up using RW    Ambulation/Gait Ambulation/Gait assistance: Min guard Gait Distance (Feet): 80 Feet Assistive device: Rolling walker (2 wheels) Gait Pattern/deviations: Step-through pattern Gait velocity: decr     General Gait Details: gait slow. tends to bump into door frames  Stairs            Wheelchair Mobility    Modified Rankin (Stroke Patients Only)       Balance Overall balance assessment: Mild deficits observed, not formally tested                                           Pertinent Vitals/Pain Pain Assessment Pain Assessment: No/denies pain    Home Living Family/patient expects to be discharged to:: Private residence Living Arrangements: Spouse/significant other Available Help at Discharge: Family Type of Home: House Home Access: Stairs to enter Entrance Stairs-Rails: NResearch scientist (medical)of Steps: 3   Home Layout: One level Home Equipment: None Additional Comments: can borrow a RAdministrator, artsor 2 wheeled    Prior Function Prior Level of Function : Independent/Modified Independent             Mobility Comments: walks without AD, denies falls in past 6 months ADLs Comments: independent     Hand Dominance   Dominant Hand: Right  Extremity/Trunk Assessment   Upper Extremity Assessment Upper Extremity Assessment: Overall WFL for tasks assessed    Lower Extremity Assessment Lower Extremity Assessment: Generalized weakness    Cervical / Trunk Assessment Cervical / Trunk Assessment: Normal  Communication   Communication: No difficulties  Cognition Arousal/Alertness: Awake/alert Behavior During Therapy: Flat  affect, WFL for tasks assessed/performed Overall Cognitive Status: Within Functional Limits for tasks assessed                                          General Comments      Exercises     Assessment/Plan    PT Assessment All further PT needs can be met in the next venue of care  PT Problem List Decreased strength;Decreased mobility;Decreased safety awareness;Decreased activity tolerance;Decreased balance;Decreased knowledge of use of DME       PT Treatment Interventions      PT Goals (Current goals can be found in the Care Plan section)  Acute Rehab PT Goals Patient Stated Goal: go home PT Goal Formulation: All assessment and education complete, DC therapy    Frequency       Co-evaluation               AM-PAC PT "6 Clicks" Mobility  Outcome Measure Help needed turning from your back to your side while in a flat bed without using bedrails?: None Help needed moving from lying on your back to sitting on the side of a flat bed without using bedrails?: None Help needed moving to and from a bed to a chair (including a wheelchair)?: A Little Help needed standing up from a chair using your arms (e.g., wheelchair or bedside chair)?: A Little Help needed to walk in hospital room?: A Little Help needed climbing 3-5 steps with a railing? : A Little 6 Click Score: 20    End of Session Equipment Utilized During Treatment: Gait belt Activity Tolerance: Patient tolerated treatment well Patient left: in chair;with call bell/phone within reach;with family/visitor present Nurse Communication: Mobility status PT Visit Diagnosis: Unsteadiness on feet (R26.81)    Time: 5051-8335 PT Time Calculation (min) (ACUTE ONLY): 17 min   Charges:   PT Evaluation $PT Eval Low Complexity: 1 Low          [place] Office [phone] Weekend [phone]   Claretha Cooper 04/02/2022, 11:21 AM

## 2022-04-02 NOTE — Plan of Care (Signed)
  Problem: Clinical Measurements: Goal: Respiratory complications will improve Outcome: Adequate for Discharge   Problem: Clinical Measurements: Goal: Cardiovascular complication will be avoided Outcome: Adequate for Discharge   Problem: Activity: Goal: Risk for activity intolerance will decrease Outcome: Adequate for Discharge   Problem: Elimination: Goal: Will not experience complications related to bowel motility Outcome: Adequate for Discharge

## 2022-04-02 NOTE — Op Note (Signed)
NAME: Benjamin Hensley, Benjamin Hensley MEDICAL RECORD NO: [id number] ACCOUNT NO: [id number] DATE OF BIRTH: [birth year] FACILITY: [facility] LOCATION: [id number] PHYSICIAN: [name], MD  Operative Report   DATE OF PROCEDURE: 03/31/2022  PREOPERATIVE DIAGNOSIS:  Urinoma, acute renal failure.  POSTOPERATIVE DIAGNOSES: Urinoma, acute renal failure.  PROCEDURE:  1.  Right antegrade nephrostogram with interpretation. 2.  Exchange of right nephroureteral stent. 3.  Placement of right nephrostomy tube.  ESTIMATED BLOOD LOSS:  Nil.  COMPLICATIONS:  None.  SPECIMEN:  None.  FINDINGS:  1.  Excellent placement of right nephroureteral stent into the area of the mid conduit. 2.  Successful exchange of right nephroureteral stent, distal end in the mid conduit. 3.  Scant volume on focal extravasation from renal area, suspect along prior renal tract with nephrostogram and successful placement of right lower mid nephrostomy tube 14-French type to gravity.  INDICATIONS:  The patient is a very pleasant, but unfortunate 81 year old man with history of very aggressive bladder cancer.  He is status post cystoprostatectomy.  He has done well from oncologic standpoint, but has had several issues including  small-bowel obstruction requiring operative intervention as well as an episode of acute renal failure, managed in [place] where he had a right nephrostomy tube placed. Per report at that time, thought this was due to urolithiasis.  I repeated imaging  several weeks ago and was relatively unremarkable for urolithiasis; however, given his history of urethral resection, our most prudent means of management would be antegrade ureteroscopy and we did this yesterday, which did reveal a mild right ureteral  stricture, which was dilated in uncomplicated fashion.  Some very small volume, nonobstructing urolithiasis that was removed and a right ureteral stent placement.  He was observed overnight.  Overnight, he developed worsening  abdominal pain and had very  minimal urine output.  Labs this morning corroborated elevation in creatinine.  He underwent urgent axial imaging, which revealed apparent urinoma tracking towards the area of the pelvis, his nephroureteral stent in good position. Overall clinical  picture was highly concerning for possible urine leak.  I discussed with the patient and his wife the situation and my recommendation of an urgent further renal drainage with nephrostomy tube today and they wished to proceed.  Informed consent was  obtained and placed in medical record.  PROCEDURE DETAILS:  The patient being himself verified, procedure being right nephrostomy tube placement, antegrade nephrostogram was confirmed.  Procedure timeout was performed.  Intravenous antibiotics were administered.  General endotracheal  anesthesia induced.  The patient was placed into a prone position using prone view sequential compression devices padding of his knees and ankles, chest rolls and axillary rolls were applied. In situ urostomy was kept to gravity drainage.  The patient's  right flank including his in situ nephroureteral stent was carefully prepped using chlorhexidine gluconate and a percutaneous drape was applied to this. Initial contrast was applied to the nephroureteral stent, which corroborated that the distal end was  within the conduit without any obvious extravasation of the area of the conduit.  Contrast freely floating following apparently to his urostomy.  This corroborated good placement of the nephroureteral stent.  A Super Stiff wire was placed across this and  using very careful continuous fluoroscopy, the nephroureteral stent was removed for which a peel-away dual axial catheter was introduced to the level of proximal ureter.  A ZIPwire was advanced down this all the way to the area of the conduit, exchanged  for a second Super Stiff wire via a KMP  catheter. Having obtained a dual Super Stiff access, the  peel-away sheath was removed.  The KMP catheter once again advanced acting as a exchanged nephroureteral stent.  This was again in excellent position with  the proximal end being in the mid portion of the conduit. Over the second Super Stiff wire, a 14-French nephrostomy tube was very carefully advanced to the level of the proximal ureter.  Contrast was shot through this to appropriate position of the tip  in the proximal ureter.  A Super Stiff wire was removed and under continuous fluoroscopy, the nephrostomy tube was navigated and coiled into lower pole calix.  I was quite happy with the placement of this. Additional antegrade placement of contrast did  not reveal any obvious focal site of extravasation around the kidney, but there clearly was some.  This appeared to be just along the tract.  There was contrast freely flowing from the renal pelvis all the way down the ureter to the area of the conduit.   We achieved the goals of the procedure today being maximal renal drainage.  The nephroureteral stent was capped. The nephrostomy tube was connected to straight drain.  These were sutured in place to the level of the skin and the procedure was  terminated.  The patient tolerated the procedure well, no immediate periprocedural complications.  The patient was taken to [facility] in stable condition. Plan for continued hospital admission.      PAA D: 03/31/2022 4:32:47 pm T: 03/31/2022 11:33:00 pm  JOB: [id number]/ [id number]

## 2022-04-02 NOTE — TOC Transition Note (Signed)
Transition of Care [facility]) - CM/SW Discharge Note   Patient Details  Name: Benjamin Hensley MRN: [id number] Date of Birth: 1941/08/18  [facility] CM/SW Contact:  Leeroy Cha, RN Phone Number: 04/02/2022, 3:39 PM   Clinical Narrative:    [place] pt to be done through [facility].   Final next level of care: Home w Home Health Services Barriers to Discharge: No Barriers Identified   Patient Goals and CMS Choice Patient states their goals for this hospitalization and ongoing recovery are:: to return home      Discharge Placement                       Discharge Plan and Services   Discharge Planning Services: CM Consult                      HH Arranged: PT [facility] Agency: [place] Date [place]: 04/02/22 Time [place]: [place] Representative spoke with at [place]: Malachy Mood rose  Social Determinants of Health ([place]) Interventions     Readmission Risk Interventions     No data to display

## 2022-04-17 DIAGNOSIS — Z936 Other artificial openings of urinary tract status: Secondary | ICD-10-CM | POA: Diagnosis not present

## 2022-04-17 DIAGNOSIS — N261 Atrophy of kidney (terminal): Secondary | ICD-10-CM | POA: Diagnosis not present

## 2022-04-19 DIAGNOSIS — N261 Atrophy of kidney (terminal): Secondary | ICD-10-CM | POA: Diagnosis not present

## 2022-04-26 DIAGNOSIS — N130 Hydronephrosis with ureteropelvic junction obstruction: Secondary | ICD-10-CM | POA: Diagnosis not present

## 2022-05-01 DIAGNOSIS — Z936 Other artificial openings of urinary tract status: Secondary | ICD-10-CM | POA: Diagnosis not present

## 2022-05-01 DIAGNOSIS — Z8551 Personal history of malignant neoplasm of bladder: Secondary | ICD-10-CM | POA: Diagnosis not present

## 2022-05-18 DIAGNOSIS — Z8551 Personal history of malignant neoplasm of bladder: Secondary | ICD-10-CM | POA: Diagnosis not present

## 2022-05-18 DIAGNOSIS — N184 Chronic kidney disease, stage 4 (severe): Secondary | ICD-10-CM | POA: Diagnosis not present

## 2022-05-18 DIAGNOSIS — K56609 Unspecified intestinal obstruction, unspecified as to partial versus complete obstruction: Secondary | ICD-10-CM | POA: Diagnosis not present

## 2022-05-18 DIAGNOSIS — G479 Sleep disorder, unspecified: Secondary | ICD-10-CM | POA: Diagnosis not present

## 2022-06-01 DIAGNOSIS — Z936 Other artificial openings of urinary tract status: Secondary | ICD-10-CM | POA: Diagnosis not present

## 2022-06-01 DIAGNOSIS — Z8551 Personal history of malignant neoplasm of bladder: Secondary | ICD-10-CM | POA: Diagnosis not present

## 2022-06-11 DIAGNOSIS — H2511 Age-related nuclear cataract, right eye: Secondary | ICD-10-CM | POA: Diagnosis not present

## 2022-06-11 DIAGNOSIS — Z961 Presence of intraocular lens: Secondary | ICD-10-CM | POA: Diagnosis not present

## 2022-06-11 DIAGNOSIS — H40053 Ocular hypertension, bilateral: Secondary | ICD-10-CM | POA: Diagnosis not present

## 2022-08-13 ENCOUNTER — Other Ambulatory Visit (HOSPITAL_COMMUNITY): Payer: Self-pay | Admitting: Urology

## 2022-08-13 ENCOUNTER — Ambulatory Visit (HOSPITAL_COMMUNITY)
Admission: RE | Admit: 2022-08-13 | Discharge: 2022-08-13 | Disposition: A | Discharge status: Home / Self Care | Payer: Medicare Other | Source: Ambulatory Visit | Attending: Urology | Admitting: Urology

## 2022-08-13 DIAGNOSIS — C673 Malignant neoplasm of anterior wall of bladder: Secondary | ICD-10-CM | POA: Insufficient documentation

## 2022-08-13 DIAGNOSIS — C679 Malignant neoplasm of bladder, unspecified: Secondary | ICD-10-CM | POA: Diagnosis not present

## 2022-08-13 DIAGNOSIS — I700 Atherosclerosis of aorta: Secondary | ICD-10-CM | POA: Diagnosis not present

## 2022-08-13 DIAGNOSIS — N1330 Unspecified hydronephrosis: Secondary | ICD-10-CM | POA: Diagnosis not present

## 2022-08-13 DIAGNOSIS — R918 Other nonspecific abnormal finding of lung field: Secondary | ICD-10-CM | POA: Diagnosis not present

## 2022-08-13 DIAGNOSIS — N134 Hydroureter: Secondary | ICD-10-CM | POA: Diagnosis not present

## 2022-08-20 DIAGNOSIS — N261 Atrophy of kidney (terminal): Secondary | ICD-10-CM | POA: Diagnosis not present

## 2022-08-20 DIAGNOSIS — Z936 Other artificial openings of urinary tract status: Secondary | ICD-10-CM | POA: Diagnosis not present

## 2022-08-20 DIAGNOSIS — C673 Malignant neoplasm of anterior wall of bladder: Secondary | ICD-10-CM | POA: Diagnosis not present

## 2022-08-31 DIAGNOSIS — Z936 Other artificial openings of urinary tract status: Secondary | ICD-10-CM | POA: Diagnosis not present

## 2022-08-31 DIAGNOSIS — Z8551 Personal history of malignant neoplasm of bladder: Secondary | ICD-10-CM | POA: Diagnosis not present

## 2022-10-02 DIAGNOSIS — Z936 Other artificial openings of urinary tract status: Secondary | ICD-10-CM | POA: Diagnosis not present

## 2022-10-02 DIAGNOSIS — Z8551 Personal history of malignant neoplasm of bladder: Secondary | ICD-10-CM | POA: Diagnosis not present

## 2022-11-07 IMAGING — CT CT HEAD W/O CM
3 series · 15 of 47 positions shown, 18 images · non-contrast
Comparison: None.

CLINICAL DATA: Trauma, fall



[Series 1: head wo · axial · 0.47mm/px · z∈[+1306,+1441]mm · 9 of 33 slices shown, 12 images]
[im 3/33  brain]
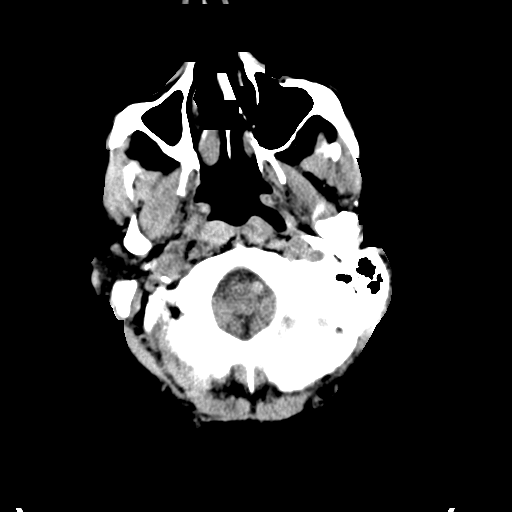
[im 3/33  bone]
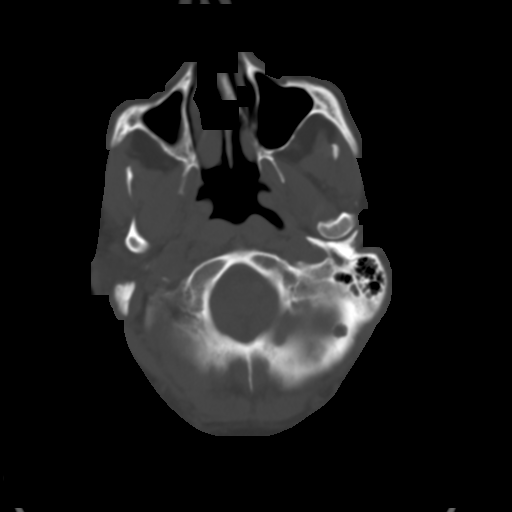
[im 6/33  brain]
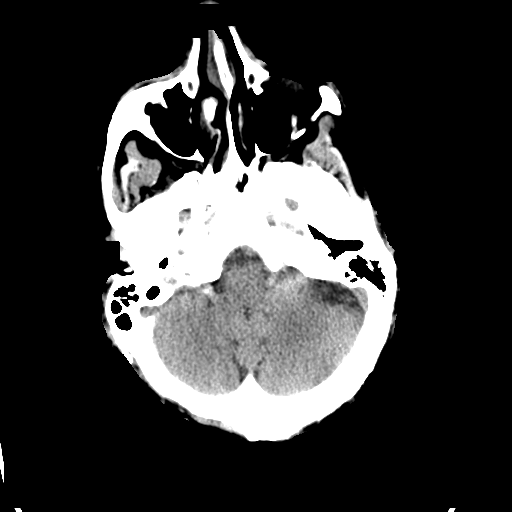
[im 9/33  brain]
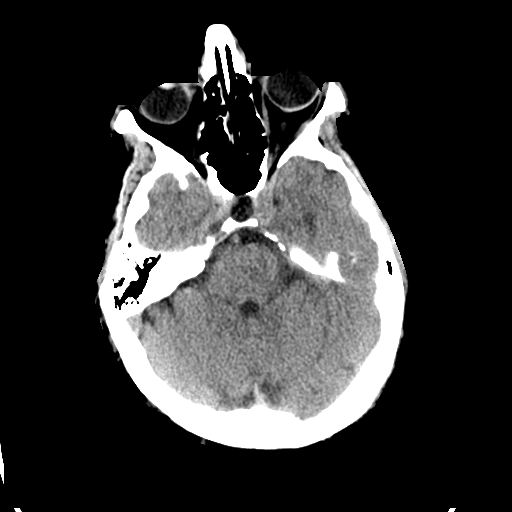
[im 13/33  brain]
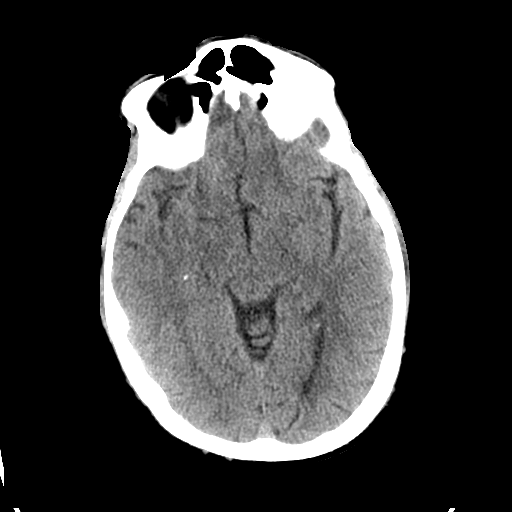
[im 17/33  brain]
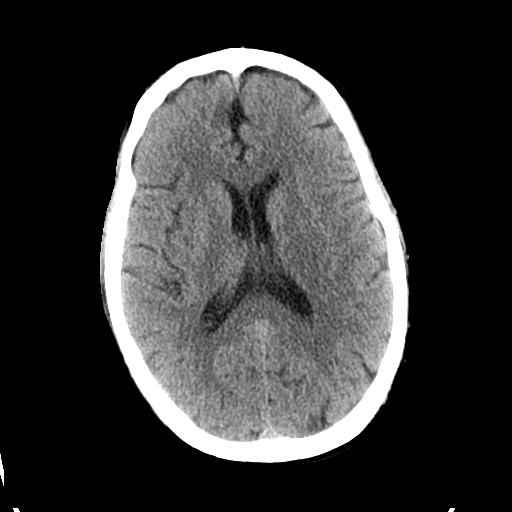
[im 17/33  bone]
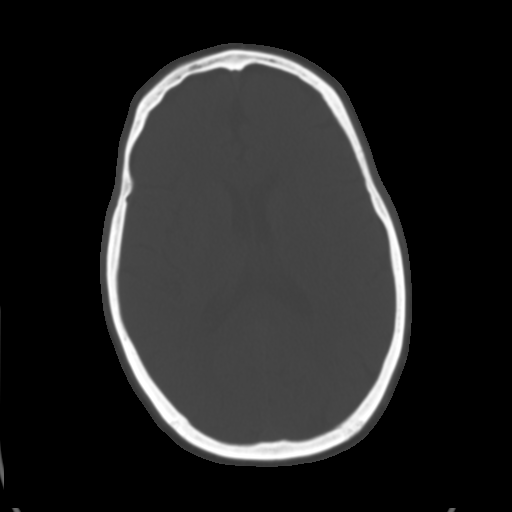
[im 20/33  brain]
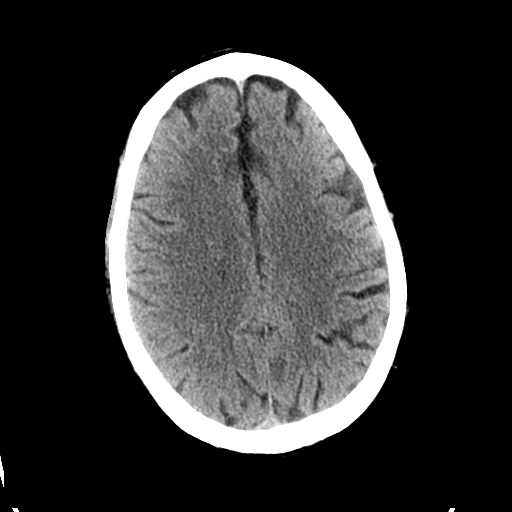
[im 24/33  brain]
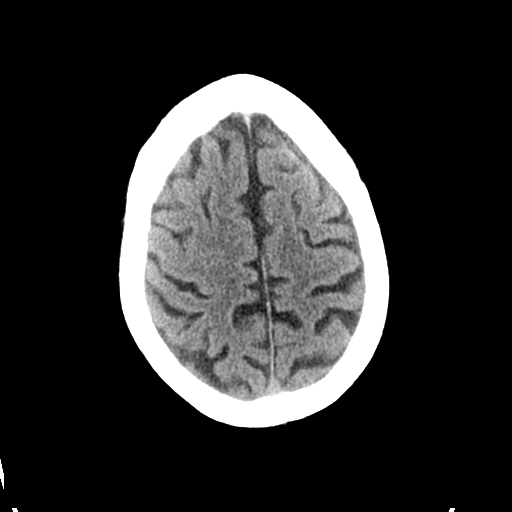
[im 27/33  brain]
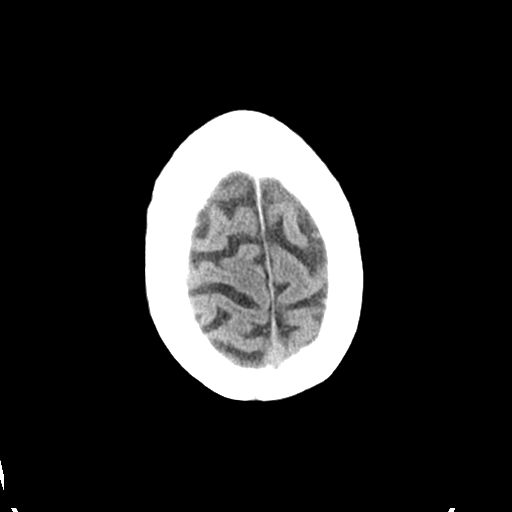
[im 30/33  brain]
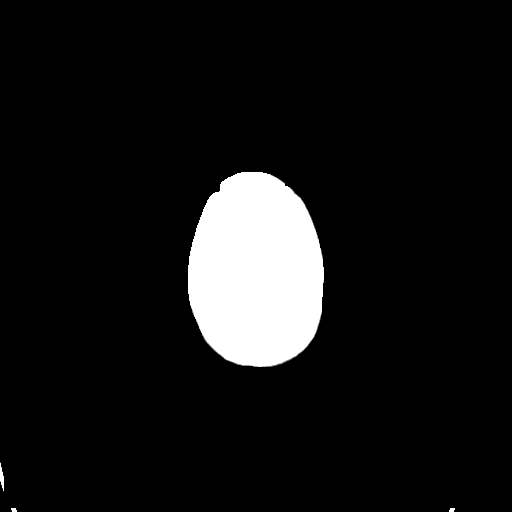
[im 30/33  bone]
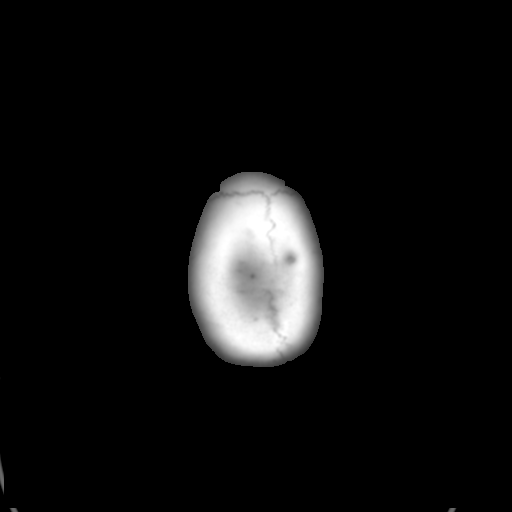

[Series 6: coronal soft tissue · coronal · 0.32mm/px · 3 of 74 slices shown]
[im 25/74  brain]
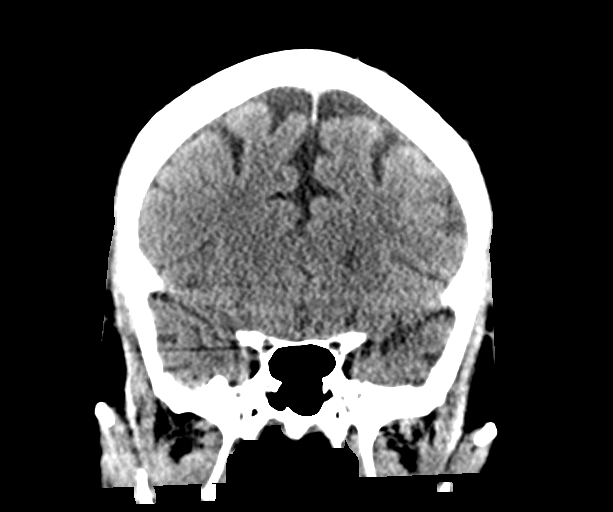
[im 33/74  brain]
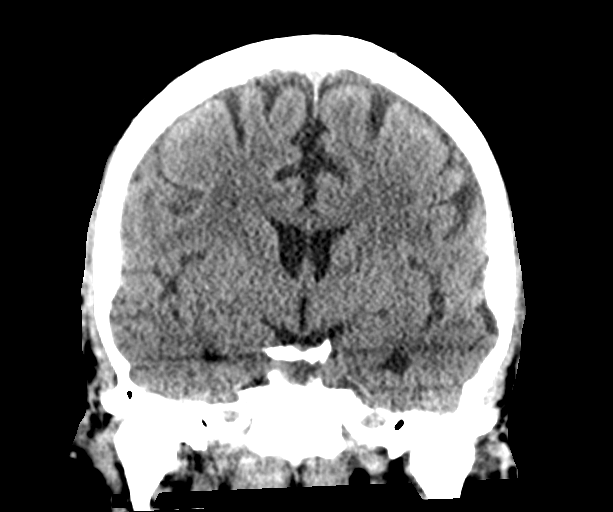
[im 41/74  brain]
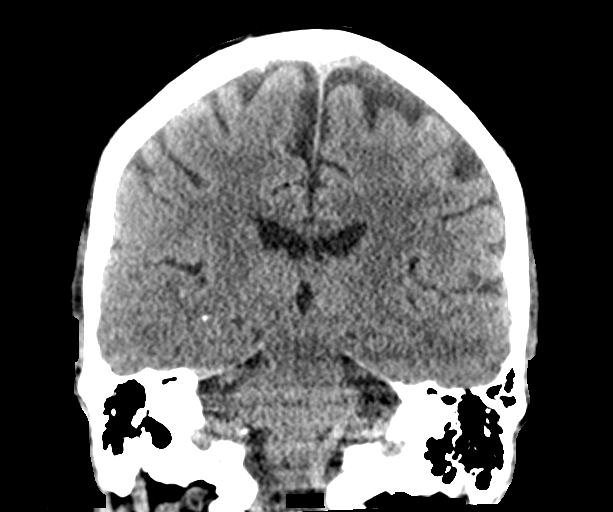

[Series 7: sagittal soft tissue · sagittal · 0.33mm/px · 3 of 55 slices shown]
[im 19/55  brain]
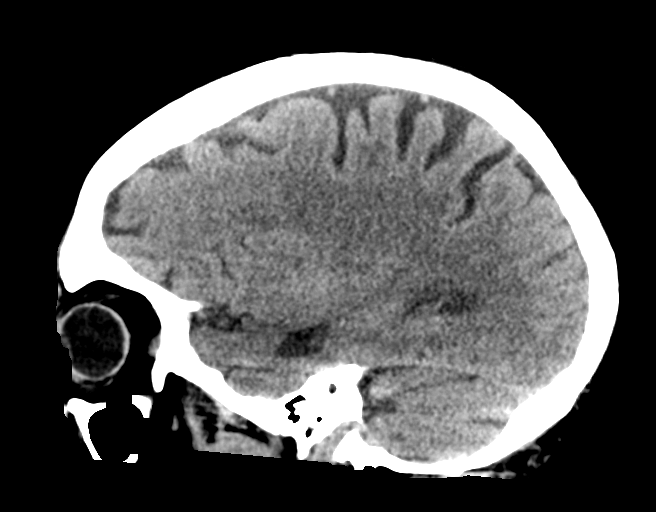
[im 28/55  brain]
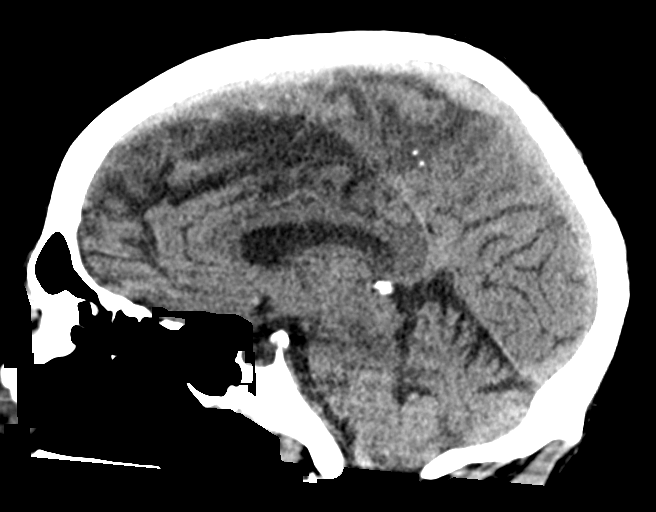
[im 37/55  brain]
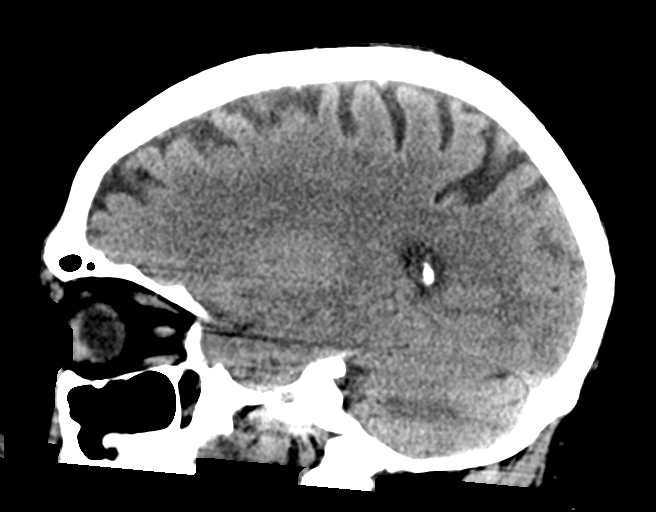

[15 of 47 positions shown; findings below may reference images not displayed]

FINDINGS: Brain: No acute intracranial hemorrhage, mass effect, or herniation.
No extra-axial fluid collections. No evidence of acute territorial
infarct. No hydrocephalus. Mild cortical volume loss. Patchy
hypodensities in the periventricular and subcortical white matter,
likely secondary to chronic microvascular ischemic changes.

Vascular: No hyperdense vessel or unexpected calcification.

Skull: Normal. Negative for fracture or focal lesion.

Sinuses/Orbits: No acute finding.

Other: None.
IMPRESSION: Chronic changes with no acute intracranial process identified.

## 2022-11-07 IMAGING — US US RENAL
1 series · 15 of 25 positions shown · non-contrast
Comparison: Retroperitoneal ultrasound 09/23/2020

CLINICAL DATA: History of cystectomy.

EXAM:
RENAL / URINARY TRACT ULTRASOUND COMPLETE

[Series 1: us renal mc & wl · 15 of 42 slices shown]
[im 1/42]
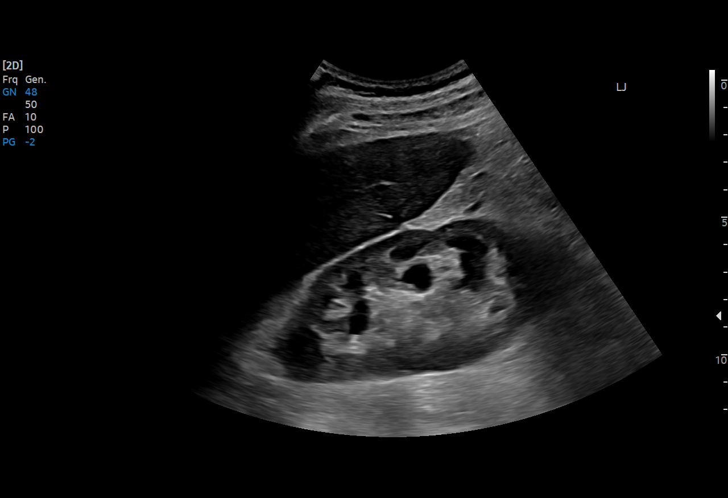
[im 4/42]
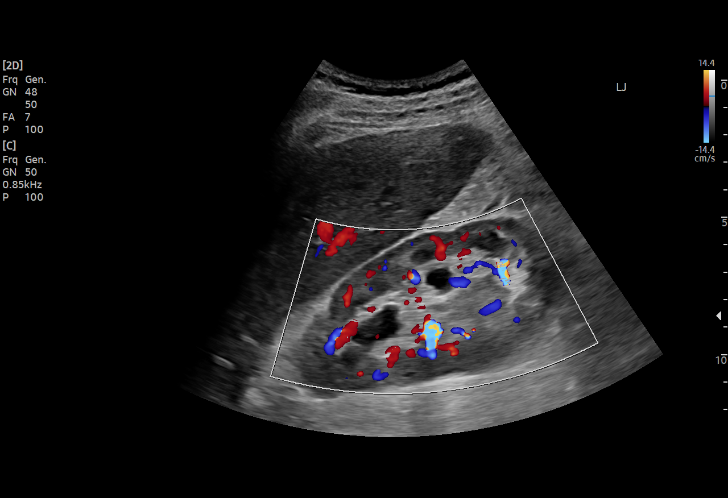
[im 7/42]
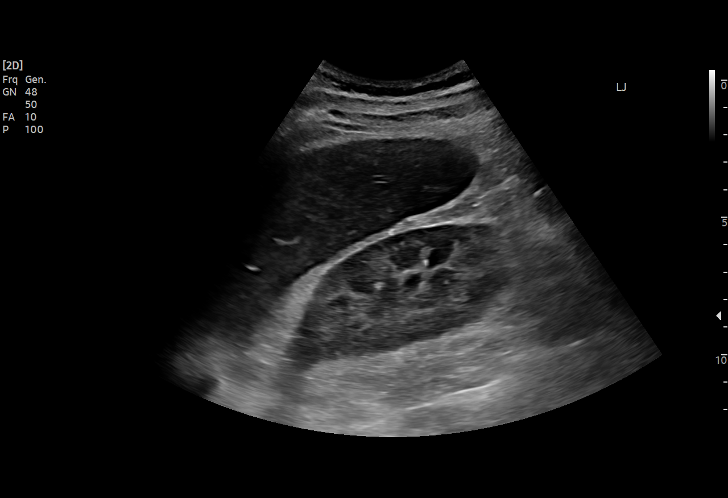
[im 9/42]
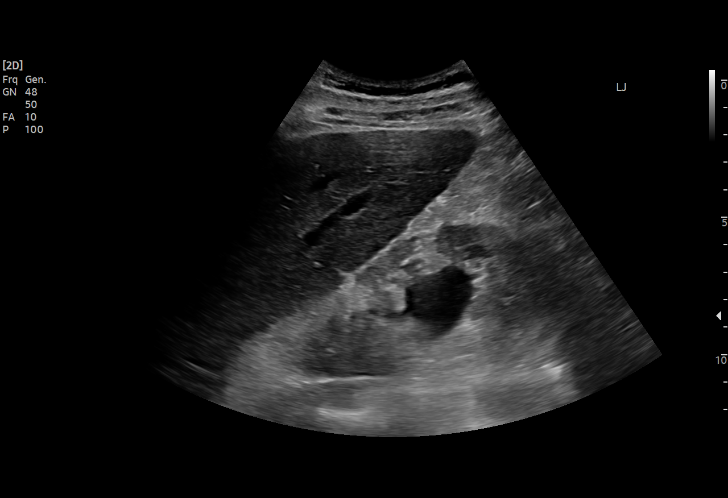
[im 12/42]
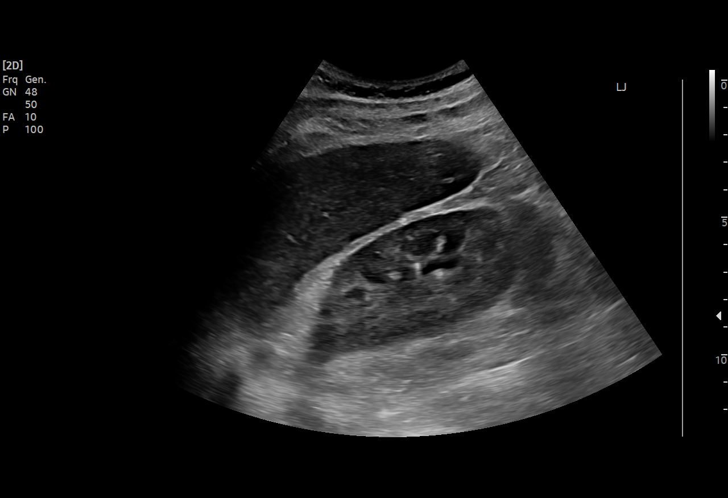
[im 16/42]
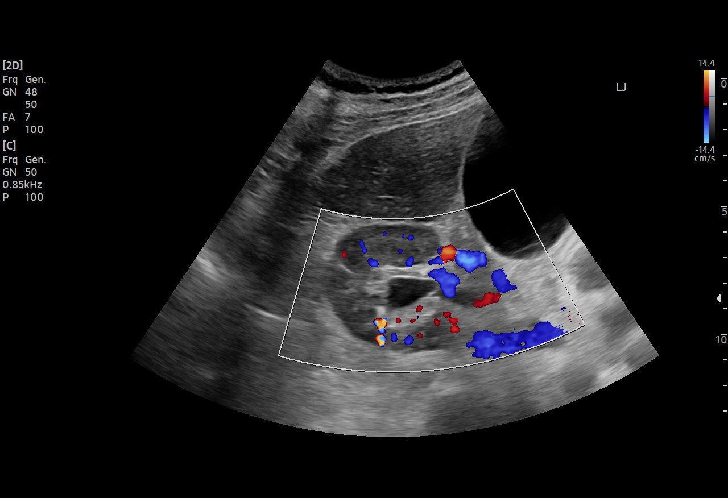
[im 18/42]
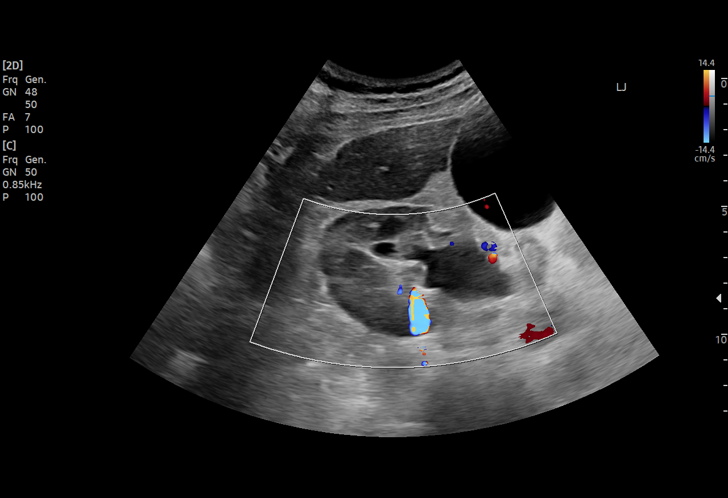
[im 21/42]
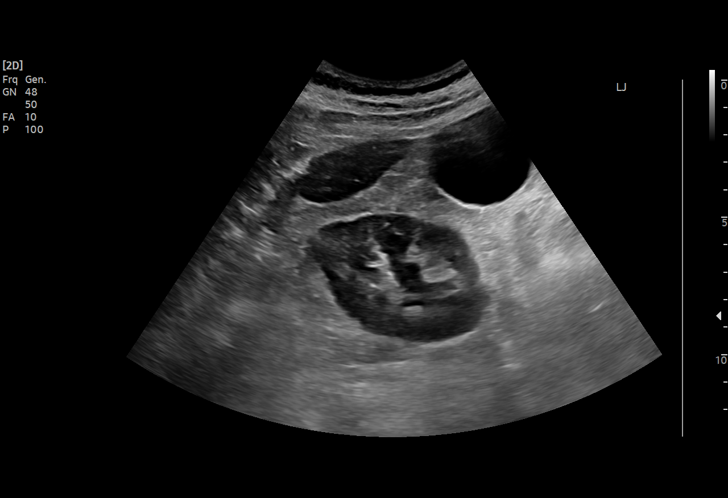
[im 24/42]
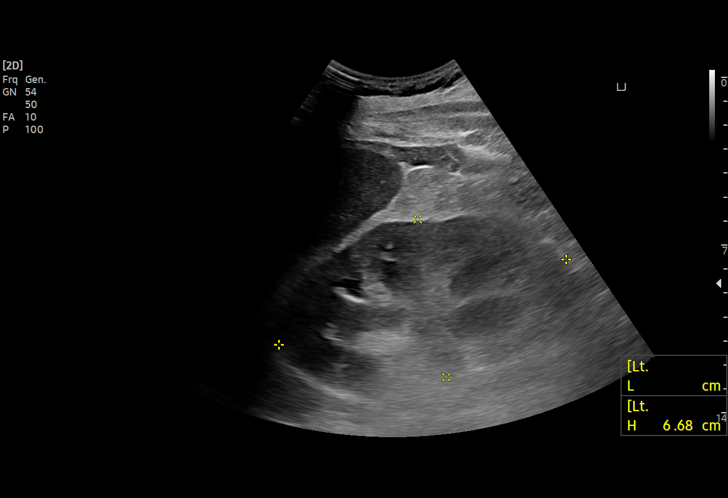
[im 26/42]
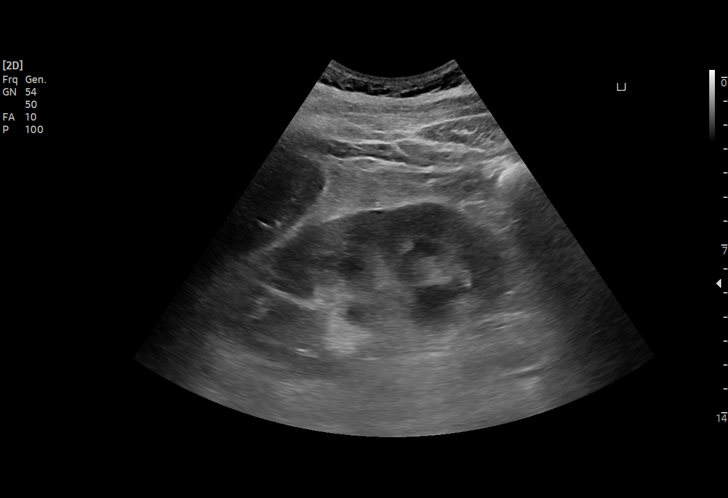
[im 30/42]
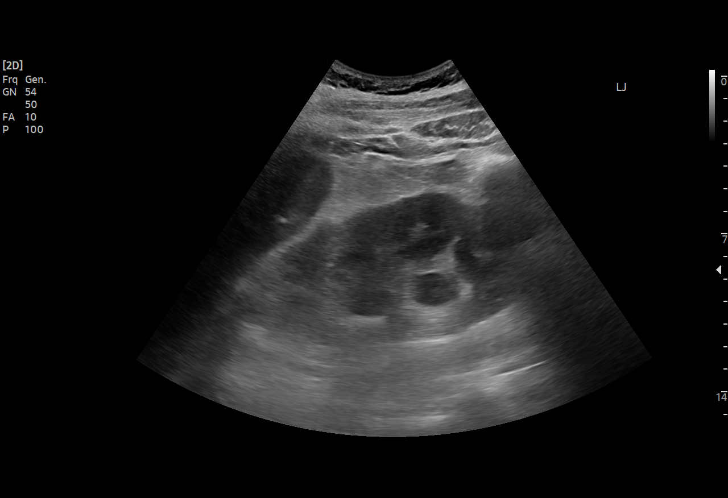
[im 33/42]
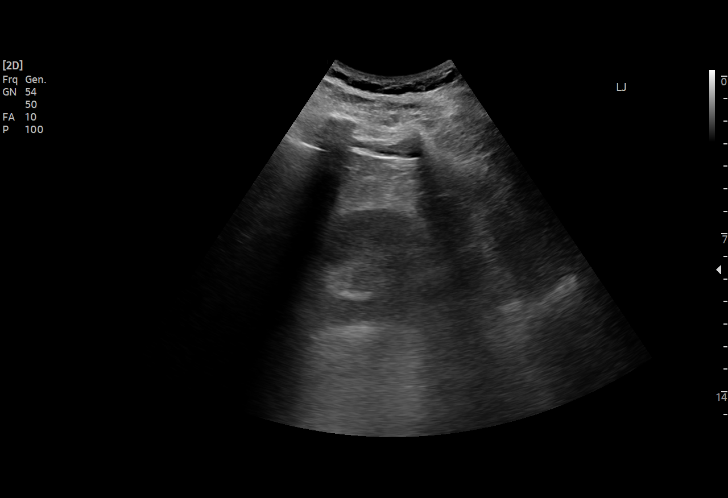
[im 35/42]
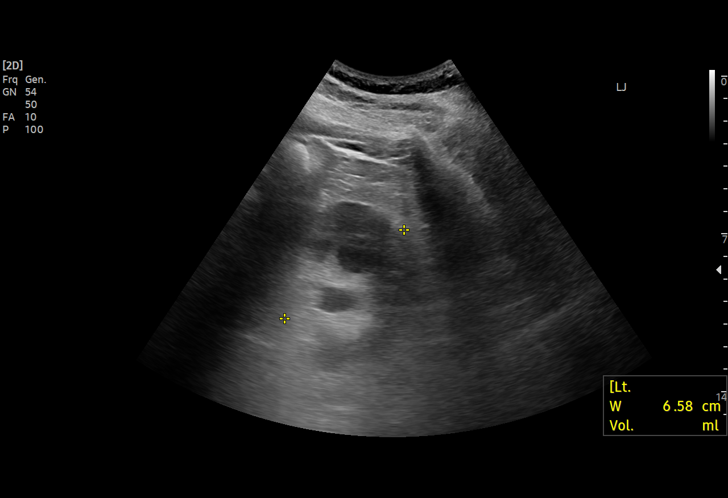
[im 38/42]
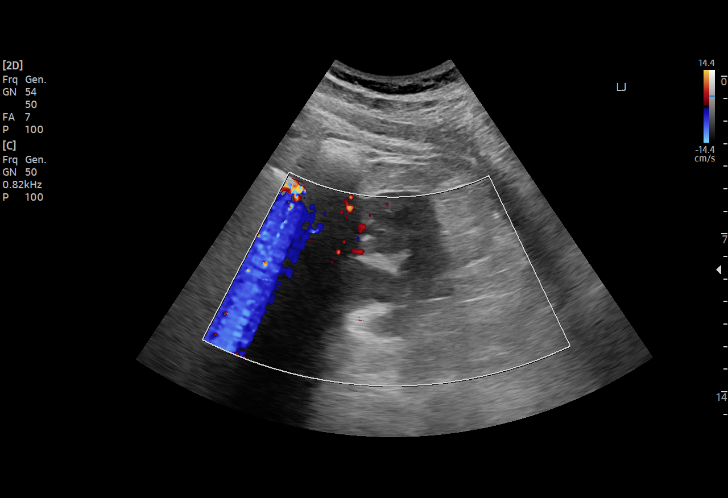
[im 42/42]
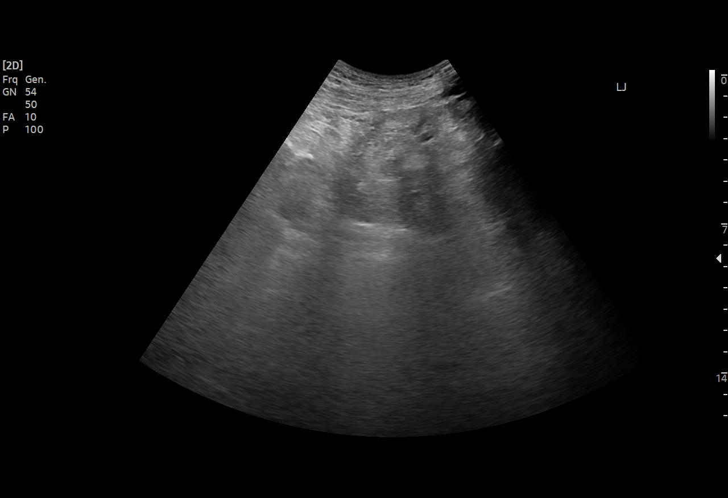

[15 of 25 positions shown; findings below may reference images not displayed]

FINDINGS: Right Kidney:

Renal measurements: 11.4 x 5.2 x 4.5 cm = volume: 139 mL.
Echogenicity is within normal limits. Mild hydronephrosis.

Left Kidney:

Renal measurements: 12.5 x 6.7 x 6.6 cm = volume: 287 mL.
Echogenicity within normal limits. Mild hydronephrosis.

Bladder:

Surgically absent.

Other:

Cholelithiasis noted.
IMPRESSION: 1. Bilateral mild hydronephrosis.
2. Cystectomy changes.
3. Cholelithiasis.

## 2022-11-13 DIAGNOSIS — E782 Mixed hyperlipidemia: Secondary | ICD-10-CM | POA: Diagnosis not present

## 2022-11-19 ENCOUNTER — Encounter (HOSPITAL_COMMUNITY): Payer: Self-pay

## 2022-11-19 ENCOUNTER — Emergency Department (HOSPITAL_COMMUNITY)
Admission: EM | Admit: 2022-11-19 | Discharge: 2022-11-19 | Disposition: A | Discharge status: Home / Self Care | Payer: Medicare Other | Attending: Emergency Medicine | Admitting: Emergency Medicine

## 2022-11-19 ENCOUNTER — Other Ambulatory Visit: Payer: Self-pay

## 2022-11-19 DIAGNOSIS — N1831 Chronic kidney disease, stage 3a: Secondary | ICD-10-CM | POA: Insufficient documentation

## 2022-11-19 DIAGNOSIS — Z87891 Personal history of nicotine dependence: Secondary | ICD-10-CM | POA: Insufficient documentation

## 2022-11-19 DIAGNOSIS — F0390 Unspecified dementia without behavioral disturbance: Secondary | ICD-10-CM | POA: Diagnosis not present

## 2022-11-19 DIAGNOSIS — Z8551 Personal history of malignant neoplasm of bladder: Secondary | ICD-10-CM | POA: Diagnosis not present

## 2022-11-19 DIAGNOSIS — Z79899 Other long term (current) drug therapy: Secondary | ICD-10-CM | POA: Insufficient documentation

## 2022-11-19 DIAGNOSIS — Z436 Encounter for attention to other artificial openings of urinary tract: Secondary | ICD-10-CM

## 2022-11-19 DIAGNOSIS — N99530 Hemorrhage of other stoma of urinary tract: Secondary | ICD-10-CM | POA: Diagnosis not present

## 2022-11-19 NOTE — ED Triage Notes (Addendum)
Patient accidentally cut his urostomy stoma with a thin shaver. He stated it was a clean razor blade. Not on a blood thinner. Not in pain. He just wants to make sure the bleeding stops.

## 2022-11-19 NOTE — Discharge Instructions (Signed)
We evaluated you for your urostomy issue.  The bleeding had stopped when we evaluated you in the emergency department.  Please be careful when shaving the area in the future.  Please follow-up with Dr. Bess Harvest for further care.  If your bleeding recurs or you develop any redness, swelling, increased pain around the site, please return to the emergency department.

## 2022-11-19 NOTE — ED Provider Triage Note (Signed)
Emergency Medicine Provider Triage Evaluation Note  Benjamin Hensley , a 82 y.o. male  was evaluated in triage.  Pt complains of a cut to his urostomy site.  Was trimming the hair around his stoma approximately 1 hour prior to arrival when he nicked the tissue.  States he has had a small amount of active bleeding since then.  Does not take a blood thinner.  Denies pain.  Review of Systems  Positive: As above Negative: As above  Physical Exam  BP (!) 147/82   Pulse 65   Temp 98.2 F (36.8 C) (Oral)   Resp 16   Ht '5\' 11"'$  (1.803 m)   Wt 81.6 kg   SpO2 95%   BMI 25.10 kg/m  Gen:   Awake, no distress   Resp:  Normal effort  MSK:   Moves extremities without difficulty  Other:  Stoma to the right of the umbilicus with minimal to no active bleeding noted.  Bright red blood in collection bag  Medical Decision Making  Medically screening exam initiated at 4:41 PM.  Appropriate orders placed.  Thurston Hole was informed that the remainder of the evaluation will be completed by another provider, this initial triage assessment does not replace that evaluation, and the importance of remaining in the ED until their evaluation is complete.     Roylene Reason, [place] 11/19/22 1642

## 2022-11-19 NOTE — ED Provider Notes (Signed)
[facility] Provider Note  CSN: [id number] Arrival date & time: 11/19/22 1616  Chief Complaint(s) Cut Stoma  HPI Benjamin Hensley is a 82 y.o. male with history of mild dementia, bladder cancer status post urostomy placement presenting to the emergency department urostomy bleeding.  He reports that he was using an electric trimmer to trim the hair around his urostomy so he can apply the sticker when it started bleeding after he nicked it.  No pain.  Reports that the bleeding has been persistent.  It stopped on arrival to the emergency department.  No lightheadedness or dizziness.  No blood thinner use.  Urostomy otherwise draining normally.  No abdominal pain.   Past Medical History Past Medical History:  Diagnosis Date   Bladder cancer ([place])    Chronic kidney disease    Dementia ([place])    History of kidney stones    Medical history non-contributory    Meningitis    hx of as a child affected right eye   Patient Active Problem List   Diagnosis Date Noted   Hydronephrosis 04/02/2022   Kidney stone 03/30/2022   Anemia of chronic disease 11/27/2021   Leukocytosis 11/27/2021   SIRS (systemic inflammatory response syndrome) ([place]) 11/27/2021   Chronic kidney disease, stage 3a ([place]) 11/26/2021   Benign prostatic hyperplasia 11/26/2021   Generalized anxiety disorder 11/26/2021   Intermittent asthma 11/26/2021   UTI (urinary tract infection) 11/26/2021   HLD (hyperlipidemia) 11/26/2021   Fall at home, initial encounter 11/26/2021   Generalized weakness 11/26/2021   AKI (acute kidney injury) ([place]) [id number]   Complicated UTI (urinary tract infection) 11/14/2021   Bladder cancer ([place]) 09/22/2021   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 Authorizing Provider   acetaminophen (TYLENOL) 500 MG tablet Take 1,000 mg by mouth every 6 (six) hours as needed for moderate pain or headache.    [provider]  atorvastatin (LIPITOR) 10 MG tablet Take 5 mg by mouth at bedtime. 02/17/21   [provider]  HYDROcodone-acetaminophen (NORCO/VICODIN) 5-325 MG tablet Take 1 tablet by mouth every 6 (six) hours as needed for moderate pain or severe pain. Post-operatively. 03/30/22 03/30/23  [name], MD  ketoconazole (NIZORAL) 2 % shampoo Apply 1 application. topically 2 (two) times a week.    [provider]  Multiple Vitamins-Minerals (MULTIVITAMIN GUMMIES ADULT PO) Take 1 tablet by mouth daily.    [provider]  sertraline (ZOLOFT) 50 MG tablet Take 50 mg by mouth daily. 12/11/16   [provider]                                                                                                                                    Past Surgical History Past  Surgical History:  Procedure Laterality Date   ciliodional cyst  yrs ago   COLONOSCOPY WITH PROPOFOL N/A 02/26/2017   Procedure: COLONOSCOPY WITH PROPOFOL;  Surgeon: Garlan Fair, MD;  Location: [facility];  Service: Endoscopy;  Laterality: N/A;   colonscopy  2008   HERNIA REPAIR     LYMPH NODE DISSECTION Bilateral 09/22/2021   Procedure: LYMPH NODE DISSECTION;  Surgeon: [name], MD;  Location: [facility];  Service: Urology;  Laterality: Bilateral;   NEPHROLITHOTOMY Right 03/30/2022   Procedure: NEPHROLITHOTOMY PERCUTANEOUS;  Surgeon: [name], MD;  Location: [facility];  Service: Urology;  Laterality: Right;  2 HRS   NEPHROLITHOTOMY Right 03/31/2022   Procedure: RIGHT ANTEGRADE NEPHROGRAM; NEPHROSTOMY TUBE PLACEMENT;  Surgeon: [name], MD;  Location: [facility];  Service: Urology;  Laterality: Right;   ROBOT ASSISTED LAPAROSCOPIC COMPLETE CYSTECT ILEAL CONDUIT N/A 09/22/2021   Procedure: XI ROBOTIC ASSISTED LAPAROSCOPIC COMPLETE CYSTECT ILEAL CONDUIT  WITH INDOCYANINE GREEN DYE;  Surgeon: [name], MD;  Location: [facility];  Service: Urology;  Laterality: N/A;  6 HRS   ROBOT ASSISTED LAPAROSCOPIC RADICAL PROSTATECTOMY N/A 09/22/2021   Procedure: XI ROBOTIC ASSISTED LAPAROSCOPIC RADICAL PROSTATECTOMY;  Surgeon: [name], MD;  Location: [facility];  Service: Urology;  Laterality: N/A;   TONSILLECTOMY  as child   TRANSURETHRAL RESECTION OF BLADDER TUMOR WITH MITOMYCIN-C N/A 05/02/2021   Procedure: TRANSURETHRAL RESECTION OF BLADDER TUMOR WITH GEMCITABINE;  Surgeon: Irine Seal, MD;  Location: [facility];  Service: Urology;  Laterality: N/A;  GENERAL ANESTHESIA WITH PARALYSIS   UMBILICAL HERNIA REPAIR N/A 09/22/2021   Procedure: OPEN HERNIA REPAIR UMBILICAL ADULT;  Surgeon: [name], MD;  Location: [facility];  Service: Urology;  Laterality: N/A;   Family History History reviewed. No pertinent family history.  Social History Social History   Tobacco Use   Smoking status: Former    Packs/day: 1.00    Years: 30.00    Total pack years: 30.00    Types: Cigarettes   Smokeless tobacco: Never  Vaping Use   Vaping Use: Never used  Substance Use Topics   Alcohol use: Not Currently    Comment: occ   Drug use: No   Allergies Patient has no known allergies.  Review of Systems Review of Systems  All other systems reviewed and are negative.   Physical Exam Vital Signs  I have reviewed the triage vital signs BP (!) 140/79   Pulse 69   Temp 98.2 F (36.8 C) (Oral)   Resp 18   Ht '5\' 11"'$  (1.803 m)   Wt 81.6 kg   SpO2 96%   BMI 25.10 kg/m  Physical Exam Vitals and nursing note reviewed.  Constitutional:      General: He is not in acute distress.    Appearance: Normal appearance.  HENT:     Head: Normocephalic and atraumatic.     Mouth/Throat:     Mouth: Mucous membranes are moist.  Eyes:     Conjunctiva/sclera: Conjunctivae normal.  Cardiovascular:     Rate and Rhythm: Normal rate.  Pulmonary:     Effort: Pulmonary  effort is normal. No respiratory distress.  Abdominal:     General: Abdomen is flat.     Tenderness: There is no abdominal tenderness.     Comments: Urostomy site in the right middle abdomen.  Small amount of dried blood around urostomy.  Small scab on the approximately 12:00 area of the urostomy with no active bleeding.  Urostomy draining yellow urine.  Skin:  General: Skin is warm and dry.     Capillary Refill: Capillary refill takes less than 2 seconds.  Neurological:     General: No focal deficit present.     Mental Status: He is alert. Mental status is at baseline.  Psychiatric:        Mood and Affect: Mood normal.        Behavior: Behavior normal.     ED Results and Treatments Labs (all labs ordered are listed, but only abnormal results are displayed) Labs Reviewed - No data to display                                                                                                                        Radiology No results found.  Pertinent labs & imaging results that were available during my care of the patient were reviewed by me and considered in my medical decision making (see MDM for details).  Medications Ordered in ED Medications - No data to display                                                                                                                                   Procedures Procedures  (including critical care time)  Medical Decision Making / ED Course   MDM:  82 year old male presenting to the emergency department with urostomy bleeding.  No evidence of ongoing active bleeding.  Patient otherwise very well-appearing.  No abdominal tenderness.  Vital signs are reassuring.  Advise follow-up with as needed with his urologist Dr. Bess Harvest.      Additional history obtained: -Additional history obtained from family   Medicines ordered and prescription drug management: No orders of the defined types were placed in this encounter.   -I have  reviewed the patients home medicines and have made adjustments as needed   Reevaluation: After the interventions noted above, I reevaluated the patient and found that their symptoms have resolved  Co morbidities that complicate the patient evaluation  Past Medical History:  Diagnosis Date   Bladder cancer ([place])    Chronic kidney disease    Dementia ([place])    History of kidney stones    Medical history non-contributory    Meningitis    hx of as a child affected right eye      Dispostion: Disposition decision including need for hospitalization was considered, and patient discharged from emergency department.    Final Clinical  Impression(s) / ED Diagnoses Final diagnoses:  Attention to urostomy [facility])     This chart was dictated using voice recognition software.  Despite best efforts to proofread,  errors can occur which can change the documentation meaning.    Cristie Hem, MD 11/19/22 2044

## 2022-12-13 DIAGNOSIS — H40013 Open angle with borderline findings, low risk, bilateral: Secondary | ICD-10-CM | POA: Diagnosis not present

## 2023-01-01 DIAGNOSIS — E782 Mixed hyperlipidemia: Secondary | ICD-10-CM | POA: Diagnosis not present

## 2023-01-01 DIAGNOSIS — Z0000 Encounter for general adult medical examination without abnormal findings: Secondary | ICD-10-CM | POA: Diagnosis not present

## 2023-01-01 DIAGNOSIS — Z8551 Personal history of malignant neoplasm of bladder: Secondary | ICD-10-CM | POA: Diagnosis not present

## 2023-01-01 DIAGNOSIS — N184 Chronic kidney disease, stage 4 (severe): Secondary | ICD-10-CM | POA: Diagnosis not present

## 2023-01-01 DIAGNOSIS — Z9181 History of falling: Secondary | ICD-10-CM | POA: Diagnosis not present

## 2023-01-07 DIAGNOSIS — C673 Malignant neoplasm of anterior wall of bladder: Secondary | ICD-10-CM | POA: Diagnosis not present

## 2023-02-05 DIAGNOSIS — N184 Chronic kidney disease, stage 4 (severe): Secondary | ICD-10-CM | POA: Diagnosis not present

## 2023-02-05 DIAGNOSIS — J4520 Mild intermittent asthma, uncomplicated: Secondary | ICD-10-CM | POA: Diagnosis not present

## 2023-02-05 DIAGNOSIS — E782 Mixed hyperlipidemia: Secondary | ICD-10-CM | POA: Diagnosis not present

## 2023-02-05 DIAGNOSIS — D509 Iron deficiency anemia, unspecified: Secondary | ICD-10-CM | POA: Diagnosis not present

## 2023-02-11 ENCOUNTER — Other Ambulatory Visit (HOSPITAL_COMMUNITY): Payer: Self-pay | Admitting: Urology

## 2023-02-11 ENCOUNTER — Ambulatory Visit (HOSPITAL_COMMUNITY)
Admission: RE | Admit: 2023-02-11 | Discharge: 2023-02-11 | Disposition: A | Discharge status: Home / Self Care | Payer: Medicare Other | Source: Ambulatory Visit | Attending: Urology | Admitting: Urology

## 2023-02-11 DIAGNOSIS — N1330 Unspecified hydronephrosis: Secondary | ICD-10-CM | POA: Diagnosis not present

## 2023-02-11 DIAGNOSIS — C673 Malignant neoplasm of anterior wall of bladder: Secondary | ICD-10-CM | POA: Diagnosis not present

## 2023-02-11 DIAGNOSIS — N134 Hydroureter: Secondary | ICD-10-CM | POA: Diagnosis not present

## 2023-02-11 DIAGNOSIS — I700 Atherosclerosis of aorta: Secondary | ICD-10-CM | POA: Diagnosis not present

## 2023-02-11 DIAGNOSIS — Z0389 Encounter for observation for other suspected diseases and conditions ruled out: Secondary | ICD-10-CM | POA: Diagnosis not present

## 2023-02-18 DIAGNOSIS — C673 Malignant neoplasm of anterior wall of bladder: Secondary | ICD-10-CM | POA: Diagnosis not present

## 2023-04-18 DIAGNOSIS — Z8551 Personal history of malignant neoplasm of bladder: Secondary | ICD-10-CM | POA: Diagnosis not present

## 2023-04-18 DIAGNOSIS — Z936 Other artificial openings of urinary tract status: Secondary | ICD-10-CM | POA: Diagnosis not present

## 2023-06-19 DIAGNOSIS — H40053 Ocular hypertension, bilateral: Secondary | ICD-10-CM | POA: Diagnosis not present

## 2023-06-19 DIAGNOSIS — Z961 Presence of intraocular lens: Secondary | ICD-10-CM | POA: Diagnosis not present

## 2023-07-03 DIAGNOSIS — Z23 Encounter for immunization: Secondary | ICD-10-CM | POA: Diagnosis not present

## 2023-07-03 DIAGNOSIS — K439 Ventral hernia without obstruction or gangrene: Secondary | ICD-10-CM | POA: Diagnosis not present

## 2023-07-03 DIAGNOSIS — Z933 Colostomy status: Secondary | ICD-10-CM | POA: Diagnosis not present

## 2023-07-03 DIAGNOSIS — G319 Degenerative disease of nervous system, unspecified: Secondary | ICD-10-CM | POA: Diagnosis not present

## 2023-07-17 DIAGNOSIS — K432 Incisional hernia without obstruction or gangrene: Secondary | ICD-10-CM | POA: Diagnosis not present

## 2023-08-13 ENCOUNTER — Other Ambulatory Visit (HOSPITAL_COMMUNITY): Payer: Self-pay | Admitting: Urology

## 2023-08-13 ENCOUNTER — Ambulatory Visit (HOSPITAL_COMMUNITY)
Admission: RE | Admit: 2023-08-13 | Discharge: 2023-08-13 | Disposition: A | Discharge status: Home / Self Care | Payer: Medicare Other | Source: Ambulatory Visit | Attending: Urology | Admitting: Urology

## 2023-08-13 DIAGNOSIS — C673 Malignant neoplasm of anterior wall of bladder: Secondary | ICD-10-CM | POA: Diagnosis not present

## 2023-08-13 DIAGNOSIS — R079 Chest pain, unspecified: Secondary | ICD-10-CM | POA: Diagnosis not present

## 2023-08-13 DIAGNOSIS — C679 Malignant neoplasm of bladder, unspecified: Secondary | ICD-10-CM | POA: Diagnosis not present

## 2023-08-15 DIAGNOSIS — C673 Malignant neoplasm of anterior wall of bladder: Secondary | ICD-10-CM | POA: Diagnosis not present

## 2023-08-15 DIAGNOSIS — C679 Malignant neoplasm of bladder, unspecified: Secondary | ICD-10-CM | POA: Diagnosis not present

## 2023-08-15 DIAGNOSIS — N200 Calculus of kidney: Secondary | ICD-10-CM | POA: Diagnosis not present

## 2023-08-20 DIAGNOSIS — C673 Malignant neoplasm of anterior wall of bladder: Secondary | ICD-10-CM | POA: Diagnosis not present

## 2023-11-04 ENCOUNTER — Encounter (HOSPITAL_COMMUNITY): Payer: Self-pay | Admitting: Emergency Medicine

## 2023-11-04 ENCOUNTER — Inpatient Hospital Stay (HOSPITAL_COMMUNITY)
Admission: EM | Admit: 2023-11-04 | Discharge: 2023-11-14 | DRG: 853 | Disposition: A | Discharge status: Skilled Nursing Facility | Payer: Medicare Other | Attending: Internal Medicine | Admitting: Internal Medicine

## 2023-11-04 ENCOUNTER — Other Ambulatory Visit: Payer: Self-pay

## 2023-11-04 ENCOUNTER — Emergency Department (HOSPITAL_COMMUNITY): Payer: Medicare Other

## 2023-11-04 ENCOUNTER — Inpatient Hospital Stay (HOSPITAL_COMMUNITY): Payer: Medicare Other

## 2023-11-04 DIAGNOSIS — E871 Hypo-osmolality and hyponatremia: Secondary | ICD-10-CM | POA: Diagnosis not present

## 2023-11-04 DIAGNOSIS — J9601 Acute respiratory failure with hypoxia: Secondary | ICD-10-CM | POA: Diagnosis not present

## 2023-11-04 DIAGNOSIS — I96 Gangrene, not elsewhere classified: Secondary | ICD-10-CM | POA: Diagnosis present

## 2023-11-04 DIAGNOSIS — R7401 Elevation of levels of liver transaminase levels: Secondary | ICD-10-CM | POA: Diagnosis not present

## 2023-11-04 DIAGNOSIS — A419 Sepsis, unspecified organism: Secondary | ICD-10-CM | POA: Diagnosis not present

## 2023-11-04 DIAGNOSIS — J984 Other disorders of lung: Secondary | ICD-10-CM | POA: Diagnosis not present

## 2023-11-04 DIAGNOSIS — Z936 Other artificial openings of urinary tract status: Secondary | ICD-10-CM | POA: Diagnosis not present

## 2023-11-04 DIAGNOSIS — R579 Shock, unspecified: Secondary | ICD-10-CM | POA: Diagnosis not present

## 2023-11-04 DIAGNOSIS — A4159 Other Gram-negative sepsis: Principal | ICD-10-CM | POA: Diagnosis present

## 2023-11-04 DIAGNOSIS — R739 Hyperglycemia, unspecified: Secondary | ICD-10-CM | POA: Diagnosis present

## 2023-11-04 DIAGNOSIS — E569 Vitamin deficiency, unspecified: Secondary | ICD-10-CM | POA: Diagnosis not present

## 2023-11-04 DIAGNOSIS — Z87891 Personal history of nicotine dependence: Secondary | ICD-10-CM

## 2023-11-04 DIAGNOSIS — R918 Other nonspecific abnormal finding of lung field: Secondary | ICD-10-CM | POA: Diagnosis not present

## 2023-11-04 DIAGNOSIS — G9340 Encephalopathy, unspecified: Secondary | ICD-10-CM | POA: Diagnosis not present

## 2023-11-04 DIAGNOSIS — E876 Hypokalemia: Secondary | ICD-10-CM | POA: Diagnosis present

## 2023-11-04 DIAGNOSIS — N130 Hydronephrosis with ureteropelvic junction obstruction: Secondary | ICD-10-CM | POA: Diagnosis not present

## 2023-11-04 DIAGNOSIS — R231 Pallor: Secondary | ICD-10-CM | POA: Diagnosis not present

## 2023-11-04 DIAGNOSIS — D6959 Other secondary thrombocytopenia: Secondary | ICD-10-CM | POA: Diagnosis present

## 2023-11-04 DIAGNOSIS — G9341 Metabolic encephalopathy: Secondary | ICD-10-CM | POA: Diagnosis not present

## 2023-11-04 DIAGNOSIS — K922 Gastrointestinal hemorrhage, unspecified: Secondary | ICD-10-CM | POA: Diagnosis not present

## 2023-11-04 DIAGNOSIS — D631 Anemia in chronic kidney disease: Secondary | ICD-10-CM | POA: Diagnosis not present

## 2023-11-04 DIAGNOSIS — E873 Alkalosis: Secondary | ICD-10-CM | POA: Diagnosis present

## 2023-11-04 DIAGNOSIS — R000 Tachycardia, unspecified: Secondary | ICD-10-CM | POA: Diagnosis not present

## 2023-11-04 DIAGNOSIS — N184 Chronic kidney disease, stage 4 (severe): Secondary | ICD-10-CM | POA: Diagnosis not present

## 2023-11-04 DIAGNOSIS — Z48815 Encounter for surgical aftercare following surgery on the digestive system: Secondary | ICD-10-CM | POA: Diagnosis not present

## 2023-11-04 DIAGNOSIS — C672 Malignant neoplasm of lateral wall of bladder: Secondary | ICD-10-CM | POA: Diagnosis not present

## 2023-11-04 DIAGNOSIS — K56699 Other intestinal obstruction unspecified as to partial versus complete obstruction: Secondary | ICD-10-CM | POA: Diagnosis not present

## 2023-11-04 DIAGNOSIS — Z743 Need for continuous supervision: Secondary | ICD-10-CM | POA: Diagnosis not present

## 2023-11-04 DIAGNOSIS — K56609 Unspecified intestinal obstruction, unspecified as to partial versus complete obstruction: Secondary | ICD-10-CM | POA: Diagnosis not present

## 2023-11-04 DIAGNOSIS — E870 Hyperosmolality and hypernatremia: Secondary | ICD-10-CM | POA: Diagnosis not present

## 2023-11-04 DIAGNOSIS — N1831 Chronic kidney disease, stage 3a: Secondary | ICD-10-CM | POA: Diagnosis not present

## 2023-11-04 DIAGNOSIS — Z8551 Personal history of malignant neoplasm of bladder: Secondary | ICD-10-CM | POA: Diagnosis not present

## 2023-11-04 DIAGNOSIS — Z781 Physical restraint status: Secondary | ICD-10-CM

## 2023-11-04 DIAGNOSIS — N179 Acute kidney failure, unspecified: Secondary | ICD-10-CM | POA: Diagnosis present

## 2023-11-04 DIAGNOSIS — Z7401 Bed confinement status: Secondary | ICD-10-CM | POA: Diagnosis not present

## 2023-11-04 DIAGNOSIS — R6521 Severe sepsis with septic shock: Secondary | ICD-10-CM | POA: Diagnosis not present

## 2023-11-04 DIAGNOSIS — Z87442 Personal history of urinary calculi: Secondary | ICD-10-CM

## 2023-11-04 DIAGNOSIS — A4150 Gram-negative sepsis, unspecified: Secondary | ICD-10-CM | POA: Diagnosis not present

## 2023-11-04 DIAGNOSIS — N136 Pyonephrosis: Secondary | ICD-10-CM | POA: Diagnosis not present

## 2023-11-04 DIAGNOSIS — D696 Thrombocytopenia, unspecified: Secondary | ICD-10-CM | POA: Diagnosis not present

## 2023-11-04 DIAGNOSIS — F0390 Unspecified dementia without behavioral disturbance: Secondary | ICD-10-CM | POA: Diagnosis present

## 2023-11-04 DIAGNOSIS — J45909 Unspecified asthma, uncomplicated: Secondary | ICD-10-CM | POA: Diagnosis not present

## 2023-11-04 DIAGNOSIS — R001 Bradycardia, unspecified: Secondary | ICD-10-CM | POA: Diagnosis present

## 2023-11-04 DIAGNOSIS — Z906 Acquired absence of other parts of urinary tract: Secondary | ICD-10-CM

## 2023-11-04 DIAGNOSIS — Z8546 Personal history of malignant neoplasm of prostate: Secondary | ICD-10-CM

## 2023-11-04 DIAGNOSIS — K219 Gastro-esophageal reflux disease without esophagitis: Secondary | ICD-10-CM | POA: Diagnosis not present

## 2023-11-04 DIAGNOSIS — R1084 Generalized abdominal pain: Secondary | ICD-10-CM | POA: Diagnosis not present

## 2023-11-04 DIAGNOSIS — R1011 Right upper quadrant pain: Secondary | ICD-10-CM | POA: Diagnosis not present

## 2023-11-04 DIAGNOSIS — I499 Cardiac arrhythmia, unspecified: Secondary | ICD-10-CM | POA: Diagnosis not present

## 2023-11-04 DIAGNOSIS — F419 Anxiety disorder, unspecified: Secondary | ICD-10-CM | POA: Diagnosis not present

## 2023-11-04 DIAGNOSIS — Z6830 Body mass index (BMI) 30.0-30.9, adult: Secondary | ICD-10-CM | POA: Diagnosis not present

## 2023-11-04 DIAGNOSIS — R112 Nausea with vomiting, unspecified: Secondary | ICD-10-CM | POA: Diagnosis not present

## 2023-11-04 DIAGNOSIS — E785 Hyperlipidemia, unspecified: Secondary | ICD-10-CM | POA: Diagnosis not present

## 2023-11-04 DIAGNOSIS — R6889 Other general symptoms and signs: Secondary | ICD-10-CM | POA: Diagnosis not present

## 2023-11-04 DIAGNOSIS — D638 Anemia in other chronic diseases classified elsewhere: Secondary | ICD-10-CM | POA: Diagnosis not present

## 2023-11-04 DIAGNOSIS — I129 Hypertensive chronic kidney disease with stage 1 through stage 4 chronic kidney disease, or unspecified chronic kidney disease: Secondary | ICD-10-CM | POA: Diagnosis not present

## 2023-11-04 DIAGNOSIS — E66811 Obesity, class 1: Secondary | ICD-10-CM | POA: Diagnosis present

## 2023-11-04 DIAGNOSIS — E781 Pure hyperglyceridemia: Secondary | ICD-10-CM | POA: Diagnosis not present

## 2023-11-04 DIAGNOSIS — S3710XD Unspecified injury of ureter, subsequent encounter: Secondary | ICD-10-CM | POA: Diagnosis not present

## 2023-11-04 DIAGNOSIS — R109 Unspecified abdominal pain: Secondary | ICD-10-CM | POA: Diagnosis not present

## 2023-11-04 DIAGNOSIS — E872 Acidosis, unspecified: Secondary | ICD-10-CM | POA: Diagnosis present

## 2023-11-04 DIAGNOSIS — K565 Intestinal adhesions [bands], unspecified as to partial versus complete obstruction: Secondary | ICD-10-CM | POA: Diagnosis present

## 2023-11-04 DIAGNOSIS — K5669 Other partial intestinal obstruction: Secondary | ICD-10-CM | POA: Diagnosis not present

## 2023-11-04 DIAGNOSIS — K9429 Other complications of gastrostomy: Secondary | ICD-10-CM | POA: Diagnosis not present

## 2023-11-04 DIAGNOSIS — K567 Ileus, unspecified: Secondary | ICD-10-CM | POA: Diagnosis not present

## 2023-11-04 DIAGNOSIS — S3719XA Other injury of ureter, initial encounter: Secondary | ICD-10-CM | POA: Diagnosis not present

## 2023-11-04 DIAGNOSIS — I471 Supraventricular tachycardia, unspecified: Secondary | ICD-10-CM | POA: Diagnosis present

## 2023-11-04 DIAGNOSIS — K6389 Other specified diseases of intestine: Secondary | ICD-10-CM | POA: Diagnosis not present

## 2023-11-04 DIAGNOSIS — Z79899 Other long term (current) drug therapy: Secondary | ICD-10-CM

## 2023-11-04 DIAGNOSIS — R5381 Other malaise: Secondary | ICD-10-CM | POA: Diagnosis present

## 2023-11-04 DIAGNOSIS — Z1611 Resistance to penicillins: Secondary | ICD-10-CM | POA: Diagnosis present

## 2023-11-04 DIAGNOSIS — E440 Moderate protein-calorie malnutrition: Secondary | ICD-10-CM | POA: Diagnosis not present

## 2023-11-04 DIAGNOSIS — K55029 Acute infarction of small intestine, extent unspecified: Secondary | ICD-10-CM | POA: Diagnosis not present

## 2023-11-04 DIAGNOSIS — N1330 Unspecified hydronephrosis: Secondary | ICD-10-CM | POA: Diagnosis not present

## 2023-11-04 DIAGNOSIS — Z452 Encounter for adjustment and management of vascular access device: Secondary | ICD-10-CM | POA: Diagnosis not present

## 2023-11-04 DIAGNOSIS — R531 Weakness: Secondary | ICD-10-CM | POA: Diagnosis not present

## 2023-11-04 DIAGNOSIS — K5900 Constipation, unspecified: Secondary | ICD-10-CM | POA: Diagnosis present

## 2023-11-04 DIAGNOSIS — Z9079 Acquired absence of other genital organ(s): Secondary | ICD-10-CM

## 2023-11-04 DIAGNOSIS — N261 Atrophy of kidney (terminal): Secondary | ICD-10-CM | POA: Diagnosis present

## 2023-11-04 DIAGNOSIS — R52 Pain, unspecified: Secondary | ICD-10-CM | POA: Diagnosis not present

## 2023-11-04 DIAGNOSIS — J90 Pleural effusion, not elsewhere classified: Secondary | ICD-10-CM | POA: Diagnosis not present

## 2023-11-04 DIAGNOSIS — N134 Hydroureter: Secondary | ICD-10-CM | POA: Diagnosis not present

## 2023-11-04 DIAGNOSIS — Z4682 Encounter for fitting and adjustment of non-vascular catheter: Secondary | ICD-10-CM | POA: Diagnosis not present

## 2023-11-04 DIAGNOSIS — Z8661 Personal history of infections of the central nervous system: Secondary | ICD-10-CM

## 2023-11-04 DIAGNOSIS — R1010 Upper abdominal pain, unspecified: Secondary | ICD-10-CM | POA: Diagnosis not present

## 2023-11-04 DIAGNOSIS — E162 Hypoglycemia, unspecified: Secondary | ICD-10-CM | POA: Diagnosis not present

## 2023-11-04 DIAGNOSIS — Z8719 Personal history of other diseases of the digestive system: Secondary | ICD-10-CM

## 2023-11-04 DIAGNOSIS — I10 Essential (primary) hypertension: Secondary | ICD-10-CM | POA: Diagnosis not present

## 2023-11-04 DIAGNOSIS — K439 Ventral hernia without obstruction or gangrene: Secondary | ICD-10-CM | POA: Diagnosis present

## 2023-11-04 LAB — CBC WITH DIFFERENTIAL/PLATELET
Abs Immature Granulocytes: 0.04 10*3/uL (ref 0.00–0.07)
Basophils Absolute: 0 10*3/uL (ref 0.0–0.1)
Basophils Relative: 0 %
Eosinophils Absolute: 0 10*3/uL (ref 0.0–0.5)
Eosinophils Relative: 0 %
HCT: 42.7 % (ref 39.0–52.0)
Hemoglobin: 14.1 g/dL (ref 13.0–17.0)
Immature Granulocytes: 1 %
Lymphocytes Relative: 9 %
Lymphs Abs: 0.7 10*3/uL (ref 0.7–4.0)
MCH: 28.3 pg (ref 26.0–34.0)
MCHC: 33 g/dL (ref 30.0–36.0)
MCV: 85.6 fL (ref 80.0–100.0)
Monocytes Absolute: 0.6 10*3/uL (ref 0.1–1.0)
Monocytes Relative: 7 %
Neutro Abs: 6.9 10*3/uL (ref 1.7–7.7)
Neutrophils Relative %: 83 %
Platelets: 146 10*3/uL — ABNORMAL LOW (ref 150–400)
RBC: 4.99 MIL/uL (ref 4.22–5.81)
RDW: 13.2 % (ref 11.5–15.5)
WBC: 8.3 10*3/uL (ref 4.0–10.5)
nRBC: 0 % (ref 0.0–0.2)

## 2023-11-04 LAB — COMPREHENSIVE METABOLIC PANEL
ALT: 19 U/L (ref 0–44)
AST: 21 U/L (ref 15–41)
Albumin: 3.7 g/dL (ref 3.5–5.0)
Alkaline Phosphatase: 58 U/L (ref 38–126)
Anion gap: 11 (ref 5–15)
BUN: 44 mg/dL — ABNORMAL HIGH (ref 8–23)
CO2: 21 mmol/L — ABNORMAL LOW (ref 22–32)
Calcium: 8.9 mg/dL (ref 8.9–10.3)
Chloride: 99 mmol/L (ref 98–111)
Creatinine, Ser: 2.71 mg/dL — ABNORMAL HIGH (ref 0.61–1.24)
GFR, Estimated: 23 mL/min — ABNORMAL LOW (ref >60)
Glucose, Bld: 158 mg/dL — ABNORMAL HIGH (ref 70–99)
Potassium: 4.3 mmol/L (ref 3.5–5.1)
Sodium: 131 mmol/L — ABNORMAL LOW (ref 135–145)
Total Bilirubin: 0.8 mg/dL (ref 0.0–1.2)
Total Protein: 7.3 g/dL (ref 6.5–8.1)

## 2023-11-04 LAB — TROPONIN I (HIGH SENSITIVITY): Troponin I (High Sensitivity): 7 ng/L (ref <18)

## 2023-11-04 MED ORDER — BISACODYL 10 MG RE SUPP: 10.0000 mg | Freq: Every day | RECTAL | Status: DC | PRN

## 2023-11-04 MED ORDER — LACTATED RINGERS IV SOLN: INTRAVENOUS | Status: DC

## 2023-11-04 MED ORDER — DIATRIZOATE MEGLUMINE & SODIUM 66-10 % PO SOLN
90.0000 mL | Freq: Once | ORAL | Status: AC
  Administered 2023-11-05: 90 mL via NASOGASTRIC
  Filled 2023-11-04: qty 90

## 2023-11-04 MED ORDER — MORPHINE SULFATE (PF) 4 MG/ML IV SOLN
4.0000 mg | Freq: Once | INTRAVENOUS | Status: AC
  Administered 2023-11-04: 4 mg via INTRAVENOUS
  Filled 2023-11-04: qty 1

## 2023-11-04 MED ORDER — PROCHLORPERAZINE EDISYLATE 10 MG/2ML IJ SOLN: 5.0000 mg | Freq: Four times a day (QID) | INTRAMUSCULAR | Status: DC | PRN

## 2023-11-04 MED ORDER — ENOXAPARIN SODIUM 30 MG/0.3ML IJ SOSY
30.0000 mg | PREFILLED_SYRINGE | INTRAMUSCULAR | Status: DC
  Administered 2023-11-05: 30 mg via SUBCUTANEOUS
  Filled 2023-11-04 (×2): qty 0.3

## 2023-11-04 NOTE — ED Triage Notes (Signed)
 Patient BIB EMS from [facility]) c/o abdominal pain. Patient report worsening RUQ abdominal pain tonight. Per report patient Urostomy not draining as usual. Fentanyl IV given by EMS. Patient N/V x 1.  Patient denies Diarrhea.

## 2023-11-04 NOTE — H&P (Incomplete)
 History and Physical  Benjamin Hensley WUJ:[id number] DOB: 1940-10-20 DOA: 11/04/2023  Referring physician: Dr. Doran Durand, EDP  PCP: Thana Ates, MD  Outpatient Specialists: Urology Patient coming from: Independent living facility  Chief Complaint: Abdominal pain, nausea and vomiting.  HPI: Benjamin Hensley is a 83 y.o. male with medical history significant for CKD 4, hyperlipidemia, bladder cancer, status post cystectomy and urostomy placement who presented to the ER from independent living facility via EMS due to sudden onset abdominal pain around noon today.  Associated with nausea, vomiting, and shaking chills.  EMS was activated.  The patient was brought into the ER for further evaluation.  In the ER, the patient was noted to be tachycardic and tachypneic.  Later, he developed a fever 103.5.  CT abdomen pelvis without contrast revealed right lower quadrant ileal conduit.  Ileal conduit is decompressed and there is bilateral hydronephrosis and hydroureter.  Left renal atrophy.    CT findings suggestive of small bowel obstruction with possible vascular compromise causing small bowel wall thickening.  Level of obstruction appears to be in the right lower quadrant, likely from adhesions.  Obstruction may also be involving the proximal ileal conduit.    EDP discussed the case with urology and general surgery who will see in consultation.  [facility], hospitalist service, was asked to admit.  ED Course: Tmax 103.5.  BP 156/70, pulse 102, respiratory rate 19, O2 saturation 94% on room air.  Review of Systems: Review of systems as noted in the HPI. All other systems reviewed and are negative.   Past Medical History:  Diagnosis Date   Bladder cancer (HCC)    Chronic kidney disease    Dementia (HCC)    History of kidney stones    Medical history non-contributory    Meningitis    hx of as a child affected right eye   Past Surgical History:  Procedure Laterality Date   ciliodional cyst  yrs  ago   COLONOSCOPY WITH PROPOFOL N/A 02/26/2017   Procedure: COLONOSCOPY WITH PROPOFOL;  Surgeon: Charolett Bumpers, MD;  Location: [facility];  Service: Endoscopy;  Laterality: N/A;   colonscopy  2008   HERNIA REPAIR     LYMPH NODE DISSECTION Bilateral 09/22/2021   Procedure: LYMPH NODE DISSECTION;  Surgeon: Sebastian Ache, MD;  Location: [facility];  Service: Urology;  Laterality: Bilateral;   NEPHROLITHOTOMY Right 03/30/2022   Procedure: NEPHROLITHOTOMY PERCUTANEOUS;  Surgeon: Sebastian Ache, MD;  Location: [facility];  Service: Urology;  Laterality: Right;  2 HRS   NEPHROLITHOTOMY Right 03/31/2022   Procedure: RIGHT ANTEGRADE NEPHROGRAM; NEPHROSTOMY TUBE PLACEMENT;  Surgeon: Sebastian Ache, MD;  Location: [facility];  Service: Urology;  Laterality: Right;   ROBOT ASSISTED LAPAROSCOPIC COMPLETE CYSTECT ILEAL CONDUIT N/A 09/22/2021   Procedure: XI ROBOTIC ASSISTED LAPAROSCOPIC COMPLETE CYSTECT ILEAL CONDUIT WITH INDOCYANINE GREEN DYE;  Surgeon: Sebastian Ache, MD;  Location: [facility];  Service: Urology;  Laterality: N/A;  6 HRS   ROBOT ASSISTED LAPAROSCOPIC RADICAL PROSTATECTOMY N/A 09/22/2021   Procedure: XI ROBOTIC ASSISTED LAPAROSCOPIC RADICAL PROSTATECTOMY;  Surgeon: Sebastian Ache, MD;  Location: [facility];  Service: Urology;  Laterality: N/A;   TONSILLECTOMY  as child   TRANSURETHRAL RESECTION OF BLADDER TUMOR WITH MITOMYCIN-C N/A 05/02/2021   Procedure: TRANSURETHRAL RESECTION OF BLADDER TUMOR WITH GEMCITABINE;  Surgeon: Bjorn Pippin, MD;  Location: [facility];  Service: Urology;  Laterality: N/A;  GENERAL ANESTHESIA WITH PARALYSIS   UMBILICAL HERNIA REPAIR N/A 09/22/2021   Procedure: OPEN HERNIA REPAIR UMBILICAL ADULT;  Surgeon: Sebastian Ache, MD;  Location: [facility];  Service: Urology;  Laterality: N/A;    Social History:  reports that he has quit smoking. His smoking use included cigarettes. He has a 30 pack-year smoking history. He has never used smokeless tobacco. He reports that he does not currently  use alcohol. He reports that he does not use drugs.   No Known Allergies  Family history: None reported.  Prior to Admission medications   Medication Sig Start Date End Date Taking? Authorizing Provider  acetaminophen (TYLENOL) 500 MG tablet Take 1,000 mg by mouth every 6 (six) hours as needed for moderate pain or headache.    [provider]  atorvastatin (LIPITOR) 10 MG tablet Take 5 mg by mouth at bedtime. 02/17/21  Yes [provider]  ketoconazole (NIZORAL) 2 % shampoo Apply 1 application. topically 2 (two) times a week.    [provider]  Multiple Vitamins-Minerals (MULTIVITAMIN GUMMIES ADULT PO) Take 1 tablet by mouth daily.    [provider]  sertraline (ZOLOFT) 50 MG tablet Take 50 mg by mouth daily. 12/11/16   [provider]    Physical Exam: BP (!) 169/83   Pulse 68   Temp 98.4 F (36.9 C)   Resp 17   SpO2 98%   General: 83 y.o. year-old male well developed well nourished in no acute distress.  Alert and minimally interactive. Cardiovascular: Regular rate and rhythm with no rubs or gallops.  No thyromegaly or JVD noted.  No lower extremity edema. 2/4 pulses in all 4 extremities. Respiratory: Clear to auscultation with no wheezes or rales. Good inspiratory effort. Abdomen: Abdomen is nondistended.  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  Hypoactive bowel sounds. Muskuloskeletal: No cyanosis, clubbing or edema noted bilaterally Neuro: CN II-XII intact, strength, sensation, reflexes Skin: No ulcerative lesions noted or rashes Psychiatry: Mood is appropriate for condition and setting          Labs on Admission:  Basic Metabolic Panel: Recent Labs  Lab 11/04/23 1931  NA 131*  K 4.3  CL 99  CO2 21*  GLUCOSE 158*  BUN 44*  CREATININE 2.71*  CALCIUM 8.9   Liver Function Tests: Recent Labs  Lab 11/04/23 1931  AST 21  ALT 19  ALKPHOS 58  BILITOT 0.8  PROT 7.3  ALBUMIN 3.7   No results for input(s):  "LIPASE", "AMYLASE" in the last 168 hours. No results for input(s): "AMMONIA" in the last 168 hours. CBC: Recent Labs  Lab 11/04/23 1931  WBC 8.3  NEUTROABS 6.9  HGB 14.1  HCT 42.7  MCV 85.6  PLT 146*   Cardiac Enzymes: No results for input(s): "CKTOTAL", "CKMB", "CKMBINDEX", "TROPONINI" in the last 168 hours.  BNP (last 3 results) No results for input(s): "BNP" in the last 8760 hours.  ProBNP (last 3 results) No results for input(s): "PROBNP" in the last 8760 hours.  CBG: No results for input(s): "GLUCAP" in the last 168 hours.  Radiological Exams on Admission: CT ABDOMEN PELVIS WO CONTRAST Result Date: 11/04/2023 CLINICAL DATA:  Acute nonlocalized abdominal pain. Worsening right upper quadrant abdominal pain tonight. Urostomy not draining. EXAM: CT ABDOMEN AND PELVIS WITHOUT CONTRAST TECHNIQUE: Multidetector CT imaging of the abdomen and pelvis was performed following the standard protocol without IV contrast. RADIATION DOSE REDUCTION: This exam was performed according to the departmental dose-optimization program which includes automated exposure control, adjustment of the mA and/or kV according to patient size and/or use of iterative reconstruction technique. COMPARISON:  08/15/2023  FINDINGS: Lower chest: Mild atelectasis in the lung bases. Small esophageal hiatal hernia. Hepatobiliary: Minimal free fluid along the anterior liver edge. No focal liver lesions. Gallbladder and bile ducts are unremarkable. Pancreas: Unremarkable. No pancreatic ductal dilatation or surrounding inflammatory changes. Spleen: Calcified granulomas in the spleen.  Normal-size. Adrenals/Urinary Tract: No adrenal gland nodules. Diffuse parenchymal atrophy involving the left kidney. Bilateral hydronephrosis and hydroureter. A right lower quadrant urinary conduit is present. The urinary conduit is decompressed. Bladder is surgically absent. Stomach/Bowel: Stomach is fluid-filled without abnormal distention.  Proximal small bowel are decompressed. Mid abdominal and right lower quadrant small bowel loops are dilated and fluid-filled with thickened wall. There appears to be tethering in the right lower quadrant likely indicating an area of adhesions. This is near the apex of the ileal conduit. Scattered stool throughout the colon. There is a ventral abdominal wall hernia at the umbilicus containing fat, cecum, and terminal ileum. There is no obvious obstruction at this level. Multiple areas of surgical anastomosis are demonstrated in small bowel. Vascular/Lymphatic: Aortic atherosclerosis. No enlarged abdominal or pelvic lymph nodes. Reproductive: Prostate gland appears to be surgically absent. Other: Small amount of free fluid in the mesentery surrounding dilated bowel loops. No free air. Musculoskeletal: Degenerative changes in the spine. No acute bony abnormalities. IMPRESSION: 1. Right lower quadrant ileal conduit. Ileal conduit is decompressed and there is bilateral hydronephrosis and hydroureter. Left renal atrophy. 2. Dilated mid to lower abdominal small bowel with small bowel wall thickening in this area and mesenteric fluid. No pneumatosis. 3. Changes are likely to represent small bowel obstruction with possible vascular compromise causing small-bowel wall thickening. Level of obstruction appears to be in the right lower quadrant, likely adhesions. Obstruction may also be involving the proximal ileal conduit. 4. Aortic atherosclerosis. These results were called by telephone at the time of interpretation on 11/04/2023 at 9:41 pm to provider [facility] , who verbally acknowledged these results. Electronically Signed   By: Burman Nieves M.D.   On: 11/04/2023 21:45   DG Chest Portable 1 View Result Date: 11/04/2023 CLINICAL DATA:  Worsening right upper quadrant pain EXAM: PORTABLE CHEST 1 VIEW COMPARISON:  08/13/2023 FINDINGS: Single frontal view of the chest demonstrates an unremarkable cardiac silhouette.  No airspace disease, effusion, or pneumothorax. No acute bony abnormalities. IMPRESSION: 1. No acute intrathoracic process. Electronically Signed   By: Sharlet Salina M.D.   On: 11/04/2023 21:10    EKG: I independently viewed the EKG done and my findings are as followed: Sinus bradycardia 56.  Nonspecific ST-T changes with QTc 422.  Assessment/Plan Present on Admission:  Generalized abdominal pain  Principal Problem:   Generalized abdominal pain  Generalized abdominal pain secondary to SBO SBO protocol in place NG tube placed in the ER Follow repeat abdominal x-ray Keep n.p.o. due to SBO. Continue IV fluid hydration LR at 100 cc/h x 2 days Optimize electrolytes, magnesium, and potassium levels. Early mobilization Dulcolax suppository 10 mg x 1 as needed General Surgery consulted  Bilateral hydronephrosis with right lower quadrant ileal conduit in place Urology consulted by EDP Management per urology.  AKI on CKD 4 suspect multifactorial prerenal and postrenal Creatinine uptrending Currently on LR at 100 cc/h x 2 days Monitor urine output Urology will assess ileal conduit Avoid nephrotoxic agents, dehydration, and hypotension. Repeat BMP in the morning  Non anion gap metabolic acidosis in the setting of acute renal insufficiency Serum bicarb 19, anion gap 14 Currently on LR IV fluid  Hyperglycemia Serum glucose 172  Not on insulin therapy at home Currently n.p.o. due to SBO Obtain A1c  Thrombocytopenia Initial platelet count 146, repeat 95 Closely monitor platelet count while on DVT prophylaxis subcu Lovenox daily Repeat CBC in the morning  Hyperlipidemia Statin on hold due to n.p.o.  Obesity BMI 30 Recommend weight loss outpatient with regular physical activity and healthy dieting.  Generalized weakness PT/OT assessment Fall precautions   Time: 75 minutes.   DVT prophylaxis: Subcu Lovenox daily.  Code Status: Full code.  Family Communication: None  at bedside.  Disposition Plan: Admitted to MedSurg unit.  Consults called: Urology and general surgery consulted by EDP.  Admission status: Inpatient status.   Status is: Inpatient The patient requires at least 2 midnights for further evaluation and treatment of present condition.   Darlin Drop MD [facility] Pager [phone]  If 7PM-7AM, please contact night-coverage [email or web address] Password [facility]  11/04/2023, 11:35 PM

## 2023-11-04 NOTE — ED Provider Notes (Signed)
 [place] EMERGENCY DEPARTMENT AT [facility] Provider Note   CSN: [id number] Arrival date & time: 11/04/23  1857     History Chief Complaint  Patient presents with   Abdominal Pain    HPI Benjamin Hensley is a 83 y.o. male presenting for chief complaint of abdominal pain.  States episode started approximate 2 hours prior to arrival of severe nature circumscribed around his entire abdomen with focus around his urostomy.  Endorses decreased urine output today.  Denies fevers chills nausea vomiting shortness of breath.  He is otherwise ambulatory tolerating p.o. intake.  No known sick contacts.. No history of similar.  History of bladder cancer status post urostomy, CKD  Patient's recorded medical, surgical, social, medication list and allergies were reviewed in the Snapshot window as part of the initial history.   Review of Systems   Review of Systems  Constitutional:  Negative for chills and fever.  HENT:  Negative for ear pain and sore throat.   Eyes:  Negative for pain and visual disturbance.  Respiratory:  Negative for cough and shortness of breath.   Cardiovascular:  Negative for chest pain and palpitations.  Gastrointestinal:  Positive for abdominal pain. Negative for vomiting.  Genitourinary:  Negative for dysuria and hematuria.  Musculoskeletal:  Negative for arthralgias and back pain.  Skin:  Negative for color change and rash.  Neurological:  Negative for seizures and syncope.  All other systems reviewed and are negative.   Physical Exam Updated Vital Signs BP (!) 169/83   Pulse 68   Temp 98.4 F (36.9 C)   Resp 17   SpO2 98%  Physical Exam Vitals and nursing note reviewed.  Constitutional:      General: He is not in acute distress.    Appearance: He is well-developed.  HENT:     Head: Normocephalic and atraumatic.  Eyes:     Conjunctiva/sclera: Conjunctivae normal.  Cardiovascular:     Rate and Rhythm: Normal rate and regular rhythm.      Heart sounds: No murmur heard. Pulmonary:     Effort: Pulmonary effort is normal. No respiratory distress.     Breath sounds: Normal breath sounds.  Abdominal:     Palpations: Abdomen is soft.     Tenderness: There is no abdominal tenderness.  Musculoskeletal:        General: No swelling.     Cervical back: Neck supple.  Skin:    General: Skin is warm and dry.     Capillary Refill: Capillary refill takes less than 2 seconds.  Neurological:     Mental Status: He is alert.  Psychiatric:        Mood and Affect: Mood normal.      ED Course/ Medical Decision Making/ A&P    Procedures .Critical Care  Performed by: Glyn Ade, MD Authorized by: Glyn Ade, MD   Critical care provider statement:    Critical care time (minutes):  45   Critical care was time spent personally by me on the following activities:  Development of treatment plan with patient or surrogate, discussions with consultants, evaluation of patient's response to treatment, examination of patient, ordering and review of laboratory studies, ordering and review of radiographic studies, ordering and performing treatments and interventions, pulse oximetry, re-evaluation of patient's condition and review of old charts   Care discussed with: admitting provider      Medications Ordered in ED Medications  enoxaparin (LOVENOX) injection 30 mg (has no administration in time range)  lactated ringers infusion (has no administration in time range)  morphine (PF) 4 MG/ML injection 4 mg (4 mg Intravenous Given 11/04/23 2129)   Medical Decision Making:   [name] is a 83 y.o. male who presented to the ED today with abdominal pain, detailed above.    Additional history discussed with patient's family/caregivers.  Patient placed on continuous vitals and telemetry monitoring while in ED which was reviewed periodically.  Complete initial physical exam performed, notably the patient  was HDS in NAD.     Reviewed  and confirmed nursing documentation for past medical history, family history, social history.    Initial Assessment:   With the patient's presentation of abdominal pain, most likely diagnosis is nonspecific etiology. Other diagnoses were considered including (but not limited to) gastroenteritis, colitis, small bowel obstruction, appendicitis, cholecystitis, pancreatitis, nephrolithiasis, UTI, pyleonephritis. These are considered less likely due to history of present illness and physical exam findings.   This is most consistent with an acute life/limb threatening illness complicated by underlying chronic conditions.   Initial Plan:  CBC/CMP to evaluate for underlying infectious/metabolic etiology for patient's abdominal pain  Lipase to evaluate for pancreatitis  EKG to evaluate for cardiac source of pain  CTAB/Pelvis noncontrast to evaluate for structural/surgical etiology of patients' severe abdominal pain.  Urinalysis and repeat physical assessment to evaluate for UTI/Pyelonpehritis  Empiric management of symptoms with escalating pain control and antiemetics as needed.   Initial Study Results:   Laboratory  All laboratory results reviewed without evidence of clinically relevant pathology.    EKG EKG was reviewed independently. Rate, rhythm, axis, intervals all examined and without medically relevant abnormality. ST segments without concerns for elevations.    Radiology All images reviewed independently. Agree with radiology report at this time.   CT ABDOMEN PELVIS WO CONTRAST Result Date: 11/04/2023 CLINICAL DATA:  Acute nonlocalized abdominal pain. Worsening right upper quadrant abdominal pain tonight. Urostomy not draining. EXAM: CT ABDOMEN AND PELVIS WITHOUT CONTRAST TECHNIQUE: Multidetector CT imaging of the abdomen and pelvis was performed following the standard protocol without IV contrast. RADIATION DOSE REDUCTION: This exam was performed according to the departmental  dose-optimization program which includes automated exposure control, adjustment of the mA and/or kV according to patient size and/or use of iterative reconstruction technique. COMPARISON:  08/15/2023 FINDINGS: Lower chest: Mild atelectasis in the lung bases. Small esophageal hiatal hernia. Hepatobiliary: Minimal free fluid along the anterior liver edge. No focal liver lesions. Gallbladder and bile ducts are unremarkable. Pancreas: Unremarkable. No pancreatic ductal dilatation or surrounding inflammatory changes. Spleen: Calcified granulomas in the spleen.  Normal-size. Adrenals/Urinary Tract: No adrenal gland nodules. Diffuse parenchymal atrophy involving the left kidney. Bilateral hydronephrosis and hydroureter. A right lower quadrant urinary conduit is present. The urinary conduit is decompressed. Bladder is surgically absent. Stomach/Bowel: Stomach is fluid-filled without abnormal distention. Proximal small bowel are decompressed. Mid abdominal and right lower quadrant small bowel loops are dilated and fluid-filled with thickened wall. There appears to be tethering in the right lower quadrant likely indicating an area of adhesions. This is near the apex of the ileal conduit. Scattered stool throughout the colon. There is a ventral abdominal wall hernia at the umbilicus containing fat, cecum, and terminal ileum. There is no obvious obstruction at this level. Multiple areas of surgical anastomosis are demonstrated in small bowel. Vascular/Lymphatic: Aortic atherosclerosis. No enlarged abdominal or pelvic lymph nodes. Reproductive: Prostate gland appears to be surgically absent. Other: Small amount of free fluid in the mesentery surrounding dilated bowel  loops. No free air. Musculoskeletal: Degenerative changes in the spine. No acute bony abnormalities. IMPRESSION: 1. Right lower quadrant ileal conduit. Ileal conduit is decompressed and there is bilateral hydronephrosis and hydroureter. Left renal atrophy. 2.  Dilated mid to lower abdominal small bowel with small bowel wall thickening in this area and mesenteric fluid. No pneumatosis. 3. Changes are likely to represent small bowel obstruction with possible vascular compromise causing small-bowel wall thickening. Level of obstruction appears to be in the right lower quadrant, likely adhesions. Obstruction may also be involving the proximal ileal conduit. 4. Aortic atherosclerosis. These results were called by telephone at the time of interpretation on 11/04/2023 at 9:41 pm to provider [facility] , who verbally acknowledged these results. Electronically Signed   By: Burman Nieves M.D.   On: 11/04/2023 21:45   DG Chest Portable 1 View Result Date: 11/04/2023 CLINICAL DATA:  Worsening right upper quadrant pain EXAM: PORTABLE CHEST 1 VIEW COMPARISON:  08/13/2023 FINDINGS: Single frontal view of the chest demonstrates an unremarkable cardiac silhouette. No airspace disease, effusion, or pneumothorax. No acute bony abnormalities. IMPRESSION: 1. No acute intrathoracic process. Electronically Signed   By: Sharlet Salina M.D.   On: 11/04/2023 21:10   Final Reassessment and Plan:   83 year old male with a very complicated history.  Unfortunately presenting with obstructed urostomy and a small bowel obstruction.  I consulted general surgery who recommended n.p.o. status and observation overnight with medical therapy initially with in person consultation in AM. [facility] urology who recommended monitoring of renal function.  If he develops a nephropathic syndrome then he would need bilateral nephrostomy placements per IR. They would also plan to see in AM.  Disposition:   Based on the above findings, I believe this patient is stable for admission.    Patient/family educated about specific findings on our evaluation and explained exact reasons for admission.  Patient/family educated about clinical situation and time was allowed to answer questions.   Admission  team communicated with and agreed with need for admission. Patient admitted. Patient ready to move at this time.     Emergency Department Medication Summary:   Medications  enoxaparin (LOVENOX) injection 30 mg (has no administration in time range)  lactated ringers infusion (has no administration in time range)  morphine (PF) 4 MG/ML injection 4 mg (4 mg Intravenous Given 11/04/23 2129)             Clinical Impression:  1. Generalized abdominal pain      Admit   Final Clinical Impression(s) / ED Diagnoses Final diagnoses:  Generalized abdominal pain    Rx / DC Orders ED Discharge Orders     None         Glyn Ade, MD 11/04/23 2309

## 2023-11-04 NOTE — Discharge Instructions (Addendum)

## 2023-11-05 ENCOUNTER — Inpatient Hospital Stay (HOSPITAL_COMMUNITY): Payer: Medicare Other | Admitting: Certified Registered Nurse Anesthetist

## 2023-11-05 ENCOUNTER — Encounter (HOSPITAL_COMMUNITY)
Admission: EM | Disposition: A | Discharge status: Skilled Nursing Facility | Payer: Self-pay | Source: Home / Self Care | Attending: Internal Medicine

## 2023-11-05 ENCOUNTER — Inpatient Hospital Stay (HOSPITAL_COMMUNITY): Payer: Medicare Other

## 2023-11-05 ENCOUNTER — Other Ambulatory Visit: Payer: Self-pay

## 2023-11-05 DIAGNOSIS — E785 Hyperlipidemia, unspecified: Secondary | ICD-10-CM

## 2023-11-05 DIAGNOSIS — J45909 Unspecified asthma, uncomplicated: Secondary | ICD-10-CM

## 2023-11-05 DIAGNOSIS — K565 Intestinal adhesions [bands], unspecified as to partial versus complete obstruction: Secondary | ICD-10-CM | POA: Diagnosis not present

## 2023-11-05 DIAGNOSIS — K56609 Unspecified intestinal obstruction, unspecified as to partial versus complete obstruction: Secondary | ICD-10-CM | POA: Diagnosis present

## 2023-11-05 DIAGNOSIS — D631 Anemia in chronic kidney disease: Secondary | ICD-10-CM | POA: Diagnosis not present

## 2023-11-05 DIAGNOSIS — N179 Acute kidney failure, unspecified: Secondary | ICD-10-CM | POA: Diagnosis not present

## 2023-11-05 DIAGNOSIS — R1084 Generalized abdominal pain: Secondary | ICD-10-CM | POA: Diagnosis not present

## 2023-11-05 DIAGNOSIS — F419 Anxiety disorder, unspecified: Secondary | ICD-10-CM | POA: Diagnosis not present

## 2023-11-05 DIAGNOSIS — R579 Shock, unspecified: Secondary | ICD-10-CM

## 2023-11-05 DIAGNOSIS — N184 Chronic kidney disease, stage 4 (severe): Secondary | ICD-10-CM

## 2023-11-05 DIAGNOSIS — I10 Essential (primary) hypertension: Secondary | ICD-10-CM

## 2023-11-05 DIAGNOSIS — A419 Sepsis, unspecified organism: Secondary | ICD-10-CM | POA: Diagnosis present

## 2023-11-05 DIAGNOSIS — N1831 Chronic kidney disease, stage 3a: Secondary | ICD-10-CM | POA: Diagnosis not present

## 2023-11-05 HISTORY — PX: LAPAROTOMY: SHX154

## 2023-11-05 LAB — RESP PANEL BY RT-PCR (RSV, FLU A&B, COVID)  RVPGX2
Influenza A by PCR: NEGATIVE
Influenza B by PCR: NEGATIVE
Resp Syncytial Virus by PCR: NEGATIVE
SARS Coronavirus 2 by RT PCR: NEGATIVE

## 2023-11-05 LAB — BASIC METABOLIC PANEL
Anion gap: 14 (ref 5–15)
Anion gap: 10 (ref 5–15)
BUN: 54 mg/dL — ABNORMAL HIGH (ref 8–23)
BUN: 61 mg/dL — ABNORMAL HIGH (ref 8–23)
CO2: 19 mmol/L — ABNORMAL LOW (ref 22–32)
CO2: 15 mmol/L — ABNORMAL LOW (ref 22–32)
Calcium: 9 mg/dL (ref 8.9–10.3)
Calcium: 7.7 mg/dL — ABNORMAL LOW (ref 8.9–10.3)
Chloride: 103 mmol/L (ref 98–111)
Chloride: 114 mmol/L — ABNORMAL HIGH (ref 98–111)
Creatinine, Ser: 4.45 mg/dL — ABNORMAL HIGH (ref 0.61–1.24)
Creatinine, Ser: 4.85 mg/dL — ABNORMAL HIGH (ref 0.61–1.24)
GFR, Estimated: 13 mL/min — ABNORMAL LOW (ref >60)
GFR, Estimated: 11 mL/min — ABNORMAL LOW (ref >60)
Glucose, Bld: 172 mg/dL — ABNORMAL HIGH (ref 70–99)
Glucose, Bld: 112 mg/dL — ABNORMAL HIGH (ref 70–99)
Potassium: 4.7 mmol/L (ref 3.5–5.1)
Potassium: 4.8 mmol/L (ref 3.5–5.1)
Sodium: 136 mmol/L (ref 135–145)
Sodium: 139 mmol/L (ref 135–145)

## 2023-11-05 LAB — POCT I-STAT 7, (LYTES, BLD GAS, ICA,H+H)
Acid-base deficit: 12 mmol/L — ABNORMAL HIGH (ref 0.0–2.0)
Acid-base deficit: 11 mmol/L — ABNORMAL HIGH (ref 0.0–2.0)
Bicarbonate: 15.9 mmol/L — ABNORMAL LOW (ref 20.0–28.0)
Bicarbonate: 17.3 mmol/L — ABNORMAL LOW (ref 20.0–28.0)
Calcium, Ion: 1.12 mmol/L — ABNORMAL LOW (ref 1.15–1.40)
Calcium, Ion: 1.06 mmol/L — ABNORMAL LOW (ref 1.15–1.40)
HCT: 28 % — ABNORMAL LOW (ref 39.0–52.0)
HCT: 27 % — ABNORMAL LOW (ref 39.0–52.0)
Hemoglobin: 9.5 g/dL — ABNORMAL LOW (ref 13.0–17.0)
Hemoglobin: 9.2 g/dL — ABNORMAL LOW (ref 13.0–17.0)
O2 Saturation: 99 %
O2 Saturation: 98 %
Patient temperature: 38
Patient temperature: 37.2
Potassium: 4 mmol/L (ref 3.5–5.1)
Potassium: 4.1 mmol/L (ref 3.5–5.1)
Sodium: 140 mmol/L (ref 135–145)
Sodium: 141 mmol/L (ref 135–145)
TCO2: 17 mmol/L — ABNORMAL LOW (ref 22–32)
TCO2: 19 mmol/L — ABNORMAL LOW (ref 22–32)
pCO2 arterial: 46 mmHg (ref 32–48)
pCO2 arterial: 47.2 mmHg (ref 32–48)
pH, Arterial: 7.153 — CL (ref 7.35–7.45)
pH, Arterial: 7.172 — CL (ref 7.35–7.45)
pO2, Arterial: 197 mmHg — ABNORMAL HIGH (ref 83–108)
pO2, Arterial: 131 mmHg — ABNORMAL HIGH (ref 83–108)

## 2023-11-05 LAB — BLOOD CULTURE ID PANEL (REFLEXED) - BCID2

## 2023-11-05 LAB — CBG MONITORING, ED: Glucose-Capillary: 110 mg/dL — ABNORMAL HIGH (ref 70–99)

## 2023-11-05 LAB — CBC
HCT: 42.1 % (ref 39.0–52.0)
Hemoglobin: 13.8 g/dL (ref 13.0–17.0)
MCH: 28.5 pg (ref 26.0–34.0)
MCHC: 32.8 g/dL (ref 30.0–36.0)
MCV: 86.8 fL (ref 80.0–100.0)
Platelets: 95 10*3/uL — ABNORMAL LOW (ref 150–400)
RBC: 4.85 MIL/uL (ref 4.22–5.81)
RDW: 13.2 % (ref 11.5–15.5)
WBC: 6.9 10*3/uL (ref 4.0–10.5)
nRBC: 0 % (ref 0.0–0.2)

## 2023-11-05 LAB — BLOOD GAS, ARTERIAL
Acid-base deficit: 9.6 mmol/L — ABNORMAL HIGH (ref 0.0–2.0)
Bicarbonate: 15.7 mmol/L — ABNORMAL LOW (ref 20.0–28.0)
Drawn by: 213381
FIO2: 100 %
MECHVT: 600 mL
O2 Saturation: 98.7 %
PEEP: 5 cmH2O
Patient temperature: 36.7
RATE: 18 {breaths}/min
pCO2 arterial: 32 mmHg (ref 32–48)
pH, Arterial: 7.3 — ABNORMAL LOW (ref 7.35–7.45)
pO2, Arterial: 203 mmHg — ABNORMAL HIGH (ref 83–108)

## 2023-11-05 LAB — PROTIME-INR
INR: 1.6 — ABNORMAL HIGH (ref 0.8–1.2)
Prothrombin Time: 19.7 s — ABNORMAL HIGH (ref 11.4–15.2)

## 2023-11-05 LAB — URINALYSIS, ROUTINE W REFLEX MICROSCOPIC
Bilirubin Urine: NEGATIVE
Glucose, UA: NEGATIVE mg/dL
Ketones, ur: NEGATIVE mg/dL
Nitrite: NEGATIVE
Protein, ur: 100 mg/dL — AB
Specific Gravity, Urine: 1.009 (ref 1.005–1.030)
pH: 8 (ref 5.0–8.0)

## 2023-11-05 LAB — LACTIC ACID, PLASMA
Lactic Acid, Venous: 7.1 mmol/L (ref 0.5–1.9)
Lactic Acid, Venous: 3 mmol/L (ref 0.5–1.9)

## 2023-11-05 LAB — MRSA NEXT GEN BY PCR, NASAL: MRSA by PCR Next Gen: NOT DETECTED

## 2023-11-05 LAB — GLUCOSE, CAPILLARY
Glucose-Capillary: 103 mg/dL — ABNORMAL HIGH (ref 70–99)
Glucose-Capillary: 132 mg/dL — ABNORMAL HIGH (ref 70–99)

## 2023-11-05 LAB — MAGNESIUM: Magnesium: 1.9 mg/dL (ref 1.7–2.4)

## 2023-11-05 LAB — PREPARE RBC (CROSSMATCH)

## 2023-11-05 LAB — PHOSPHORUS: Phosphorus: 3 mg/dL (ref 2.5–4.6)

## 2023-11-05 SURGERY — LAPAROTOMY, EXPLORATORY
Anesthesia: General

## 2023-11-05 MED ORDER — SODIUM CHLORIDE 0.9 % IV BOLUS
1000.0000 mL | Freq: Once | INTRAVENOUS | Status: AC
  Administered 2023-11-05: 1000 mL via INTRAVENOUS

## 2023-11-05 MED ORDER — LIDOCAINE 2% (20 MG/ML) 5 ML SYRINGE
INTRAMUSCULAR | Status: DC | PRN
  Administered 2023-11-05: 100 mg via INTRAVENOUS

## 2023-11-05 MED ORDER — SODIUM CHLORIDE 0.9 % IV SOLN
250.0000 mL | INTRAVENOUS | Status: DC
  Administered 2023-11-05: 250 mL via INTRAVENOUS

## 2023-11-05 MED ORDER — NOREPINEPHRINE 4 MG/250ML-% IV SOLN
0.0000 ug/min | INTRAVENOUS | Status: DC
  Administered 2023-11-05: 11 ug/min via INTRAVENOUS
  Administered 2023-11-05: 19 ug/min via INTRAVENOUS
  Administered 2023-11-06: 3 ug/min via INTRAVENOUS
  Administered 2023-11-06: 19 ug/min via INTRAVENOUS
  Administered 2023-11-06: 17 ug/min via INTRAVENOUS
  Filled 2023-11-05 (×4): qty 250

## 2023-11-05 MED ORDER — DOCUSATE SODIUM 50 MG/5ML PO LIQD: 100.0000 mg | Freq: Two times a day (BID) | ORAL | Status: DC | PRN

## 2023-11-05 MED ORDER — ORAL CARE MOUTH RINSE
15.0000 mL | OROMUCOSAL | Status: DC
  Administered 2023-11-05 – 2023-11-06 (×11): 15 mL via OROMUCOSAL

## 2023-11-05 MED ORDER — ROCURONIUM BROMIDE 10 MG/ML (PF) SYRINGE
PREFILLED_SYRINGE | INTRAVENOUS | Status: DC | PRN
  Administered 2023-11-05 (×2): 50 mg via INTRAVENOUS

## 2023-11-05 MED ORDER — POLYETHYLENE GLYCOL 3350 17 G PO PACK: 17.0000 g | PACK | Freq: Every day | ORAL | Status: DC | PRN

## 2023-11-05 MED ORDER — POLYETHYLENE GLYCOL 3350 17 G PO PACK
17.0000 g | PACK | Freq: Every day | ORAL | Status: DC
  Administered 2023-11-05 – 2023-11-06 (×2): 17 g
  Filled 2023-11-05 (×3): qty 1

## 2023-11-05 MED ORDER — PROPOFOL 10 MG/ML IV BOLUS
INTRAVENOUS | Status: DC | PRN
  Administered 2023-11-05: 120 mg via INTRAVENOUS
  Administered 2023-11-05: 50 ug/kg/min via INTRAVENOUS

## 2023-11-05 MED ORDER — ACETAMINOPHEN 650 MG RE SUPP
650.0000 mg | RECTAL | Status: DC | PRN
Start: 2023-11-05 — End: 2023-11-06
  Administered 2023-11-05: 650 mg via RECTAL
  Filled 2023-11-05: qty 1

## 2023-11-05 MED ORDER — SODIUM CHLORIDE 0.9 % IV SOLN: INTRAVENOUS | Status: DC | PRN

## 2023-11-05 MED ORDER — LACTATED RINGERS IV BOLUS
1000.0000 mL | Freq: Once | INTRAVENOUS | Status: AC
  Administered 2023-11-05: 1000 mL via INTRAVENOUS

## 2023-11-05 MED ORDER — PHENYLEPHRINE 80 MCG/ML (10ML) SYRINGE FOR IV PUSH (FOR BLOOD PRESSURE SUPPORT)
PREFILLED_SYRINGE | INTRAVENOUS | Status: AC
  Filled 2023-11-05: qty 20

## 2023-11-05 MED ORDER — PROPOFOL 10 MG/ML IV BOLUS
INTRAVENOUS | Status: AC
  Filled 2023-11-05: qty 20

## 2023-11-05 MED ORDER — SODIUM CHLORIDE 0.9 % IV SOLN: 1.0000 g | INTRAVENOUS | Status: DC

## 2023-11-05 MED ORDER — SODIUM CHLORIDE 0.9 % IV SOLN
2.0000 g | INTRAVENOUS | Status: DC
  Administered 2023-11-06: 2 g via INTRAVENOUS
  Filled 2023-11-05 (×2): qty 12.5

## 2023-11-05 MED ORDER — PHENYLEPHRINE HCL-NACL 20-0.9 MG/250ML-% IV SOLN
INTRAVENOUS | Status: DC | PRN
Start: 2023-11-05 — End: 2023-11-05
  Administered 2023-11-05: 40 ug/min via INTRAVENOUS

## 2023-11-05 MED ORDER — STERILE WATER FOR INJECTION IV SOLN
INTRAVENOUS | Status: DC
  Filled 2023-11-05 (×2): qty 150
  Filled 2023-11-05: qty 1000

## 2023-11-05 MED ORDER — SODIUM CHLORIDE 0.9% IV SOLUTION: Freq: Once | INTRAVENOUS | Status: DC

## 2023-11-05 MED ORDER — METRONIDAZOLE 500 MG/100ML IV SOLN
500.0000 mg | Freq: Two times a day (BID) | INTRAVENOUS | Status: DC
  Administered 2023-11-05 – 2023-11-06 (×3): 500 mg via INTRAVENOUS
  Filled 2023-11-05 (×3): qty 100

## 2023-11-05 MED ORDER — ALBUMIN HUMAN 5 % IV SOLN: INTRAVENOUS | Status: DC | PRN

## 2023-11-05 MED ORDER — PHENYLEPHRINE 80 MCG/ML (10ML) SYRINGE FOR IV PUSH (FOR BLOOD PRESSURE SUPPORT)
PREFILLED_SYRINGE | INTRAVENOUS | Status: DC | PRN
  Administered 2023-11-05 (×2): 160 ug via INTRAVENOUS
  Administered 2023-11-05: 360 ug via INTRAVENOUS
  Administered 2023-11-05 (×2): 160 ug via INTRAVENOUS

## 2023-11-05 MED ORDER — ALBUMIN HUMAN 5 % IV SOLN
INTRAVENOUS | Status: AC
  Filled 2023-11-05: qty 250

## 2023-11-05 MED ORDER — FENTANYL CITRATE (PF) 100 MCG/2ML IJ SOLN
INTRAMUSCULAR | Status: AC
  Filled 2023-11-05: qty 2

## 2023-11-05 MED ORDER — ACETAMINOPHEN 650 MG RE SUPP
650.0000 mg | Freq: Once | RECTAL | Status: AC
Start: 2023-11-05 — End: 2023-11-05
  Administered 2023-11-05: 650 mg via RECTAL
  Filled 2023-11-05: qty 1

## 2023-11-05 MED ORDER — FENTANYL CITRATE PF 50 MCG/ML IJ SOSY
25.0000 ug | PREFILLED_SYRINGE | INTRAMUSCULAR | Status: DC | PRN
  Administered 2023-11-06: 50 ug via INTRAVENOUS
  Filled 2023-11-05: qty 1

## 2023-11-05 MED ORDER — ORAL CARE MOUTH RINSE: 15.0000 mL | OROMUCOSAL | Status: DC | PRN

## 2023-11-05 MED ORDER — FENTANYL CITRATE (PF) 100 MCG/2ML IJ SOLN
INTRAMUSCULAR | Status: DC | PRN
  Administered 2023-11-05 (×4): 25 ug via INTRAVENOUS

## 2023-11-05 MED ORDER — INSULIN ASPART 100 UNIT/ML IJ SOLN
0.0000 [IU] | INTRAMUSCULAR | Status: DC
  Administered 2023-11-05 – 2023-11-10 (×6): 1 [IU] via SUBCUTANEOUS
  Administered 2023-11-10: 2 [IU] via SUBCUTANEOUS
  Administered 2023-11-10: 1 [IU] via SUBCUTANEOUS
  Filled 2023-11-05: qty 0.09

## 2023-11-05 MED ORDER — CALCIUM CHLORIDE 10 % IV SOLN
INTRAVENOUS | Status: DC | PRN
  Administered 2023-11-05: 200 mg via INTRAVENOUS

## 2023-11-05 MED ORDER — PROPOFOL 1000 MG/100ML IV EMUL
0.0000 ug/kg/min | INTRAVENOUS | Status: DC
  Administered 2023-11-05: 30 ug/kg/min via INTRAVENOUS
  Administered 2023-11-05: 40 ug/kg/min via INTRAVENOUS
  Administered 2023-11-06: 30 ug/kg/min via INTRAVENOUS
  Administered 2023-11-06: 10 ug/kg/min via INTRAVENOUS
  Filled 2023-11-05 (×4): qty 100

## 2023-11-05 MED ORDER — CALCIUM CHLORIDE 10 % IV SOLN
INTRAVENOUS | Status: AC
  Filled 2023-11-05: qty 10

## 2023-11-05 MED ORDER — NOREPINEPHRINE 4 MG/250ML-% IV SOLN
2.0000 ug/min | INTRAVENOUS | Status: DC
  Administered 2023-11-05: 2 ug/min via INTRAVENOUS
  Administered 2023-11-05: 10 ug/min via INTRAVENOUS
  Filled 2023-11-05 (×2): qty 250

## 2023-11-05 MED ORDER — FENTANYL CITRATE PF 50 MCG/ML IJ SOSY: 25.0000 ug | PREFILLED_SYRINGE | INTRAMUSCULAR | Status: DC | PRN

## 2023-11-05 MED ORDER — SODIUM CHLORIDE 0.9 % IR SOLN
Status: DC | PRN
Start: 2023-11-05 — End: 2023-11-05
  Administered 2023-11-05: 2000 mL
  Administered 2023-11-05: 1000 mL

## 2023-11-05 MED ORDER — SUCCINYLCHOLINE CHLORIDE 200 MG/10ML IV SOSY
PREFILLED_SYRINGE | INTRAVENOUS | Status: DC | PRN
  Administered 2023-11-05: 180 mg via INTRAVENOUS

## 2023-11-05 MED ORDER — SODIUM CHLORIDE 0.9 % IV SOLN
2.0000 g | Freq: Once | INTRAVENOUS | Status: AC
  Administered 2023-11-05: 2 g via INTRAVENOUS
  Filled 2023-11-05: qty 12.5

## 2023-11-05 MED ORDER — DOCUSATE SODIUM 50 MG/5ML PO LIQD
100.0000 mg | Freq: Two times a day (BID) | ORAL | Status: DC
  Administered 2023-11-06: 100 mg
  Filled 2023-11-05: qty 10

## 2023-11-05 MED ORDER — SODIUM BICARBONATE 8.4 % IV SOLN
INTRAVENOUS | Status: DC | PRN
  Administered 2023-11-05: 50 meq via INTRAVENOUS

## 2023-11-05 MED ORDER — CHLORHEXIDINE GLUCONATE CLOTH 2 % EX PADS
6.0000 | MEDICATED_PAD | Freq: Every day | CUTANEOUS | Status: DC
  Administered 2023-11-05: 6 via TOPICAL

## 2023-11-05 MED ORDER — SUGAMMADEX SODIUM 200 MG/2ML IV SOLN
INTRAVENOUS | Status: DC | PRN
  Administered 2023-11-05: 200 mg via INTRAVENOUS

## 2023-11-05 MED ORDER — PANTOPRAZOLE SODIUM 40 MG IV SOLR
40.0000 mg | Freq: Every day | INTRAVENOUS | Status: DC
  Administered 2023-11-05 – 2023-11-09 (×5): 40 mg via INTRAVENOUS
  Filled 2023-11-05 (×5): qty 10

## 2023-11-05 SURGICAL SUPPLY — 47 items
APPLICATOR COTTON TIP 6 STRL (MISCELLANEOUS) ×2 IMPLANT
APPLICATOR COTTON TIP 6IN STRL (MISCELLANEOUS) ×1 IMPLANT
BAG COUNTER SPONGE SURGICOUNT (BAG) IMPLANT
BLADE EXTENDED COATED 6.5IN (ELECTRODE) IMPLANT
BLADE HEX COATED 2.75 (ELECTRODE) ×2 IMPLANT
BNDG GAUZE DERMACEA FLUFF 4 (GAUZE/BANDAGES/DRESSINGS) IMPLANT
CHLORAPREP W/TINT 26 (MISCELLANEOUS) ×2 IMPLANT
CLIP LIGATING HEM O LOK PURPLE (MISCELLANEOUS) IMPLANT
CLIP LIGATING HEMO O LOK GREEN (MISCELLANEOUS) IMPLANT
COVER MAYO STAND STRL (DRAPES) IMPLANT
COVER SURGICAL LIGHT HANDLE (MISCELLANEOUS) ×2 IMPLANT
DRAPE LAPAROSCOPIC ABDOMINAL (DRAPES) ×2 IMPLANT
DRAPE WARM FLUID 44X44 (DRAPES) IMPLANT
ELECT REM PT RETURN 15FT ADLT (MISCELLANEOUS) ×2 IMPLANT
EVACUATOR SILICONE 100CC (DRAIN) IMPLANT
GAUZE SPONGE 4X4 12PLY STRL (GAUZE/BANDAGES/DRESSINGS) ×2 IMPLANT
GLOVE BIO SURGEON STRL SZ7.5 (GLOVE) ×4 IMPLANT
GLOVE BIOGEL PI IND STRL 7.0 (GLOVE) ×2 IMPLANT
GOWN STRL REUS W/ TWL LRG LVL3 (GOWN DISPOSABLE) ×2 IMPLANT
GOWN STRL REUS W/ TWL XL LVL3 (GOWN DISPOSABLE) ×2 IMPLANT
GUIDEWIRE STR DUAL SENSOR (WIRE) IMPLANT
HANDLE SUCTION POOLE (INSTRUMENTS) IMPLANT
KIT BASIN OR (CUSTOM PROCEDURE TRAY) ×2 IMPLANT
KIT TURNOVER KIT A (KITS) IMPLANT
LIGASURE IMPACT 36 18CM CVD LR (INSTRUMENTS) IMPLANT
NS IRRIG 1000ML POUR BTL (IV SOLUTION) ×2 IMPLANT
PACK GENERAL/GYN (CUSTOM PROCEDURE TRAY) ×2 IMPLANT
RELOAD PROXIMATE 75MM BLUE (ENDOMECHANICALS) ×2 IMPLANT
RELOAD STAPLE 75 3.8 BLU REG (ENDOMECHANICALS) IMPLANT
SPONGE T-LAP 18X18 ~~LOC~~+RFID (SPONGE) IMPLANT
STAPLER GUN LINEAR PROX 60 (STAPLE) IMPLANT
STAPLER PROXIMATE 75MM BLUE (STAPLE) ×2 IMPLANT
STAPLER SKIN PROX WIDE 3.9 (STAPLE) ×2 IMPLANT
SUCTION POOLE HANDLE (INSTRUMENTS) IMPLANT
SUT ETHILON 2 0 PS N (SUTURE) IMPLANT
SUT NOVA NAB DX-16 0-1 5-0 T12 (SUTURE) IMPLANT
SUT PDS AB 1 TP1 96 (SUTURE) IMPLANT
SUT SILK 2 0 SH CR/8 (SUTURE) IMPLANT
SUT SILK 2-0 18XBRD TIE 12 (SUTURE) IMPLANT
SUT SILK 3 0 SH CR/8 (SUTURE) IMPLANT
SUT SILK 3-0 18XBRD TIE 12 (SUTURE) IMPLANT
SUT VIC AB 4-0 RB1 27XBRD (SUTURE) IMPLANT
SYSTEM UROSTOMY GENTLE TOUCH (WOUND CARE) IMPLANT
TOWEL OR 17X26 10 PK STRL BLUE (TOWEL DISPOSABLE) ×4 IMPLANT
TRAY FOLEY MTR SLVR 16FR STAT (SET/KITS/TRAYS/PACK) IMPLANT
WATER STERILE IRR 1000ML POUR (IV SOLUTION) ×2 IMPLANT
YANKAUER SUCT BULB TIP NO VENT (SUCTIONS) IMPLANT

## 2023-11-05 NOTE — Consult Note (Signed)
 Urology Consult Note   Requesting Attending Physician:  Lanae Boast, MD Service Providing Consult: Urology  Consulting Attending:    Reason for Consult:  bilateral hydronephrosis with   HPI: Benjamin Hensley is seen in consultation for reasons noted above at the request of Lanae Boast, MD.    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 Prior path rhabdoid varient.   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   4 - Umbilical Hernia - fat-containing umbilical hernia incidental on CT 2022, no h/o strangulation, reapired at time of cystectomy (simple non-absobable closure) at cystectomy 2023. Some recurrence noted on serial imaging late 2023 (after open surgery in [place]).   5 - Bacteruria / Recurrent Peylo - expected GU colonization after cystectomy 2-23. Most recent UCX 2023 enterobacer sens cipro, gent, bactrim. Had pyelo hospitalization 11/2021.   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 just poor function). Cr 1.9's aiwth normal K and volume status.   7 - Low Grade RIGHT Ureteral Stricture - s/p antegrade ureteroscopy / balloon dilation of short segment anastamotic area mild  stricture (79F pre dilation, 33F post). This has been non-recurrent.   8 - Small Bowel Obstruction - s/p ex-lap and resection of 150cm small bowel 02/2022 in [place] for "internal hernia".  ------------------  Assessment:  83 y.o. male with ileal conduit 2/2 aggressive bladder and prostate cancer -1/23. Benjamin Hensley pt. Minimally functional left kidney at baseline. Opens eyes to stimulus but unresponsive on exam. Tachycardic to 140's with adequate MAP but B/P trending downward. Ileal conduit beefy pink with some clear yellow urine and a fair amount of mucous. Appears to have mass effect from bowel obstruction on conduit. Fevering-Tmax 103.5f.   Recommendations: #Bilateral hydronephrosis #CKD IV  Left kidney hydronephrotic at baseline and minimally functional. Less probability recurrent right ureteral stricture. Some component of mass effect of obstructed bowel on ileal conduit.   Renal functional has been variable over the last year. Having essentially a solitary kidney with advanced kidney disease, he is more susceptible to AKI. He appears to be experiencing mass effect on his conduit from the obstructed bowel, but it is impossible to separate his pre-renal losses d/t vomiting +/- sepsis, from obstructive uropathy at this time. Agree with volume resuscitation, NG tube, and trending labs. Will order renal US tomorrow and consider right PCNT if he is worsening clinically.   Urinalysis is unremarkable, particularly for an ileal conduit. In the context of  obstructive uropathy, this would be a less likely cause for a septic picture.   No indication for surgical intervention at this time.   Case and plan discussed with Dr. Berneice Heinrich  Past Medical History: Past Medical History:  Diagnosis Date   Bladder cancer [facility])    Chronic  kidney disease    Dementia (HCC)    History of kidney stones    Medical history non-contributory    Meningitis    hx of as a child affected right eye    Past Surgical  History:  Past Surgical History:  Procedure Laterality Date   ciliodional cyst  yrs ago   COLONOSCOPY WITH PROPOFOL N/A 02/26/2017   Procedure: COLONOSCOPY WITH PROPOFOL;  Surgeon: Charolett Bumpers, MD;  Location: [facility];  Service: Endoscopy;  Laterality: N/A;   colonscopy  2008   HERNIA REPAIR     LYMPH NODE DISSECTION Bilateral 09/22/2021   Procedure: LYMPH NODE DISSECTION;  Surgeon: Sebastian Ache, MD;  Location: [facility];  Service: Urology;  Laterality: Bilateral;   NEPHROLITHOTOMY Right 03/30/2022   Procedure: NEPHROLITHOTOMY PERCUTANEOUS;  Surgeon: Sebastian Ache, MD;  Location: [facility];  Service: Urology;  Laterality: Right;  2 HRS   NEPHROLITHOTOMY Right 03/31/2022   Procedure: RIGHT ANTEGRADE NEPHROGRAM; NEPHROSTOMY TUBE PLACEMENT;  Surgeon: Sebastian Ache, MD;  Location: [facility];  Service: Urology;  Laterality: Right;   ROBOT ASSISTED LAPAROSCOPIC COMPLETE CYSTECT ILEAL CONDUIT N/A 09/22/2021   Procedure: XI ROBOTIC ASSISTED LAPAROSCOPIC COMPLETE CYSTECT ILEAL CONDUIT WITH INDOCYANINE GREEN DYE;  Surgeon: Sebastian Ache, MD;  Location: [facility];  Service: Urology;  Laterality: N/A;  6 HRS   ROBOT ASSISTED LAPAROSCOPIC RADICAL PROSTATECTOMY N/A 09/22/2021   Procedure: XI ROBOTIC ASSISTED LAPAROSCOPIC RADICAL PROSTATECTOMY;  Surgeon: Sebastian Ache, MD;  Location: [facility];  Service: Urology;  Laterality: N/A;   TONSILLECTOMY  as child   TRANSURETHRAL RESECTION OF BLADDER TUMOR WITH MITOMYCIN-C N/A 05/02/2021   Procedure: TRANSURETHRAL RESECTION OF BLADDER TUMOR WITH GEMCITABINE;  Surgeon: Bjorn Pippin, MD;  Location: [facility];  Service: Urology;  Laterality: N/A;  GENERAL ANESTHESIA WITH PARALYSIS   UMBILICAL HERNIA REPAIR N/A 09/22/2021   Procedure: OPEN HERNIA REPAIR UMBILICAL ADULT;  Surgeon: Sebastian Ache, MD;  Location: [facility];  Service: Urology;  Laterality: N/A;    Medication: Current Facility-Administered Medications  Medication Dose Route Frequency Provider Last Rate Last  Admin   acetaminophen (TYLENOL) suppository 650 mg  650 mg Rectal Q4H PRN Dow Adolph N, DO   650 mg at 11/05/23 7846   acetaminophen (TYLENOL) suppository 650 mg  650 mg Rectal Once Kc, Dayna Barker, MD       bisacodyl (DULCOLAX) suppository 10 mg  10 mg Rectal Daily PRN Margo Aye, Carole N, DO       [START ON 11/06/2023] ceFEPIme (MAXIPIME) 1 g in sodium chloride 0.9 % 100 mL IVPB  1 g Intravenous Q24H Poindexter, Leann T, RPH       enoxaparin (LOVENOX) injection 30 mg  30 mg Subcutaneous Q24H Hall, Carole N, DO       lactated ringers infusion   Intravenous Continuous Darlin Drop, DO 100 mL/hr at 11/05/23 0111 New Bag at 11/05/23 0111   prochlorperazine (COMPAZINE) injection 5 mg  5 mg Intravenous Q6H PRN Dow Adolph N, DO       sodium chloride 0.9 % bolus 1,000 mL  1,000 mL Intravenous Once Kc, Dayna Barker, MD       Current Outpatient Medications  Medication Sig Dispense Refill   atorvastatin (LIPITOR) 10 MG tablet Take 5 mg by mouth at bedtime.     acetaminophen (TYLENOL) 500 MG tablet Take 1,000 mg by mouth every 6 (six) hours as needed for moderate pain or headache.     ketoconazole (NIZORAL) 2 % shampoo Apply 1 application. topically 2 (two) times a week.  Multiple Vitamins-Minerals (MULTIVITAMIN GUMMIES ADULT PO) Take 1 tablet by mouth daily.     sertraline (ZOLOFT) 50 MG tablet Take 50 mg by mouth daily.  3    Allergies: No Known Allergies  Social History: Social History   Tobacco Use   Smoking status: Former    Current packs/day: 1.00    Average packs/day: 1 pack/day for 30.0 years (30.0 ttl pk-yrs)    Types: Cigarettes   Smokeless tobacco: Never  Vaping Use   Vaping status: Never Used  Substance Use Topics   Alcohol use: Not Currently    Comment: occ   Drug use: No    Family History History reviewed. No pertinent family history.  Review of Systems  Unable to perform ROS: Critical illness    Objective   Vital signs in last 24 hours: BP 126/81   Pulse (!) 128   Temp  (!) 101.6 F (38.7 C) (Oral)   Resp (!) 26   Ht 5\' 11"  (1.803 m)   Wt 98.5 kg   SpO2 96%   BMI 30.28 kg/m   Physical Exam: General: UTA HEENT: [place]/AT Pulmonary: Normal work of breathing Cardiovascular: no cyanosis Abdomen: Soft, NTTP, nondistended GU: RLQ urostomy bag in place with scant clear yellow urine and mucous. Ileal conduit beefy pink.  Neuro: opens eyes to stimulus, nothing more  Most Recent Labs: Lab Results  Component Value Date   WBC 6.9 11/05/2023   HGB 13.8 11/05/2023   HCT 42.1 11/05/2023   PLT 95 (L) 11/05/2023    Lab Results  Component Value Date   NA 136 11/05/2023   K 4.7 11/05/2023   CL 103 11/05/2023   CO2 19 (L) 11/05/2023   BUN 54 (H) 11/05/2023   CREATININE 4.45 (H) 11/05/2023   CALCIUM 9.0 11/05/2023   MG 1.9 11/05/2023   PHOS 3.0 11/05/2023    No results found for: "INR", "APTT"   Urine Culture: @[id number] (laburin,org,r9620,r9621)@   IMAGING: DG Abd Portable 1V-Small Bowel Protocol-Position Verification Result Date: 11/05/2023 CLINICAL DATA:  NG tube placement EXAM: PORTABLE ABDOMEN - 1 VIEW COMPARISON:  CT today. FINDINGS: NG tube tip is in the stomach with the side port near the GE junction. IMPRESSION: NG tube in the proximal stomach. Electronically Signed   By: Charlett Nose M.D.   On: 11/05/2023 01:37   CT ABDOMEN PELVIS WO CONTRAST Result Date: 11/04/2023 CLINICAL DATA:  Acute nonlocalized abdominal pain. Worsening right upper quadrant abdominal pain tonight. Urostomy not draining. EXAM: CT ABDOMEN AND PELVIS WITHOUT CONTRAST TECHNIQUE: Multidetector CT imaging of the abdomen and pelvis was performed following the standard protocol without IV contrast. RADIATION DOSE REDUCTION: This exam was performed according to the departmental dose-optimization program which includes automated exposure control, adjustment of the mA and/or kV according to patient size and/or use of iterative reconstruction technique. COMPARISON:  08/15/2023  FINDINGS: Lower chest: Mild atelectasis in the lung bases. Small esophageal hiatal hernia. Hepatobiliary: Minimal free fluid along the anterior liver edge. No focal liver lesions. Gallbladder and bile ducts are unremarkable. Pancreas: Unremarkable. No pancreatic ductal dilatation or surrounding inflammatory changes. Spleen: Calcified granulomas in the spleen.  Normal-size. Adrenals/Urinary Tract: No adrenal gland nodules. Diffuse parenchymal atrophy involving the left kidney. Bilateral hydronephrosis and hydroureter. A right lower quadrant urinary conduit is present. The urinary conduit is decompressed. Bladder is surgically absent. Stomach/Bowel: Stomach is fluid-filled without abnormal distention. Proximal small bowel are decompressed. Mid abdominal and right lower quadrant small bowel loops are dilated and fluid-filled with thickened wall.  There appears to be tethering in the right lower quadrant likely indicating an area of adhesions. This is near the apex of the ileal conduit. Scattered stool throughout the colon. There is a ventral abdominal wall hernia at the umbilicus containing fat, cecum, and terminal ileum. There is no obvious obstruction at this level. Multiple areas of surgical anastomosis are demonstrated in small bowel. Vascular/Lymphatic: Aortic atherosclerosis. No enlarged abdominal or pelvic lymph nodes. Reproductive: Prostate gland appears to be surgically absent. Other: Small amount of free fluid in the mesentery surrounding dilated bowel loops. No free air. Musculoskeletal: Degenerative changes in the spine. No acute bony abnormalities. IMPRESSION: 1. Right lower quadrant ileal conduit. Ileal conduit is decompressed and there is bilateral hydronephrosis and hydroureter. Left renal atrophy. 2. Dilated mid to lower abdominal small bowel with small bowel wall thickening in this area and mesenteric fluid. No pneumatosis. 3. Changes are likely to represent small bowel obstruction with possible  vascular compromise causing small-bowel wall thickening. Level of obstruction appears to be in the right lower quadrant, likely adhesions. Obstruction may also be involving the proximal ileal conduit. 4. Aortic atherosclerosis. These results were called by telephone at the time of interpretation on 11/04/2023 at 9:41 pm to provider [facility] , who verbally acknowledged these results. Electronically Signed   By: Burman Nieves M.D.   On: 11/04/2023 21:45   DG Chest Portable 1 View Result Date: 11/04/2023 CLINICAL DATA:  Worsening right upper quadrant pain EXAM: PORTABLE CHEST 1 VIEW COMPARISON:  08/13/2023 FINDINGS: Single frontal view of the chest demonstrates an unremarkable cardiac silhouette. No airspace disease, effusion, or pneumothorax. No acute bony abnormalities. IMPRESSION: 1. No acute intrathoracic process. Electronically Signed   By: Sharlet Salina M.D.   On: 11/04/2023 21:10    ------  Elmon Kirschner, NP Pager: [phone]   Please contact the urology consult pager with any further questions/concerns.

## 2023-11-05 NOTE — Anesthesia Postprocedure Evaluation (Signed)
 Anesthesia Post Note  Patient: Benjamin Hensley  Procedure(s) Performed: EXPLORATORY LAPAROTOMY; lysis of adhesions; small bowel resection; ligation of left ureter; primary hernia repair     Patient location during evaluation: SICU Anesthesia Type: General Level of consciousness: sedated Pain management: pain level controlled Vital Signs Assessment: post-procedure vital signs reviewed and stable Respiratory status: patient remains intubated per anesthesia plan Cardiovascular status: stable Postop Assessment: no apparent nausea or vomiting Anesthetic complications: no  No notable events documented.  Last Vitals:  Vitals:   11/05/23 1245 11/05/23 1617  BP: (!) 96/56   Pulse: (!) 124   Resp: (!) 22   Temp:    SpO2: 95% 100%    Last Pain:  Vitals:   11/05/23 1109  TempSrc:   PainSc: Asleep                 Kennieth Rad

## 2023-11-05 NOTE — Anesthesia Preprocedure Evaluation (Signed)
 Anesthesia Evaluation  Patient identified by MRN, date of birth, ID band Patient confused    Reviewed: Allergy & Precautions, NPO status , Patient's Chart, lab work & pertinent test results  History of Anesthesia Complications Negative for: history of anesthetic complications  Airway Mallampati: Unable to assess  TM Distance: >3 FB Neck ROM: Full    Dental  (+) Dental Advisory Given   Pulmonary asthma , former smoker   Pulmonary exam normal        Cardiovascular negative cardio ROS  Rhythm:Regular Rate:Tachycardia     Neuro/Psych  PSYCHIATRIC DISORDERS Anxiety    Dementia negative neurological ROS     GI/Hepatic Neg liver ROS,,, Bowel obstruction    Endo/Other   Obesity   Renal/GU CRFRenal disease     Musculoskeletal negative musculoskeletal ROS (+)    Abdominal   Peds  Hematology  Plt 95k    Anesthesia Other Findings   Reproductive/Obstetrics                              Anesthesia Physical Anesthesia Plan  ASA: 3 and emergent  Anesthesia Plan: General   Post-op Pain Management: Ofirmev IV (intra-op)*   Induction: Intravenous, Rapid sequence and Cricoid pressure planned  PONV Risk Score and Plan: 2 and Treatment may vary due to age or medical condition, Ondansetron and Propofol infusion  Airway Management Planned: Oral ETT  Additional Equipment: Arterial line  Intra-op Plan:   Post-operative Plan: Possible Post-op intubation/ventilation  Informed Consent:      Dental advisory given, Only emergency history available and History available from chart only  Plan Discussed with: CRNA and Anesthesiologist  Anesthesia Plan Comments:         Anesthesia Quick Evaluation

## 2023-11-05 NOTE — Progress Notes (Signed)
 Pharmacy Antibiotic Note  GILLIAN KLUEVER is a 83 y.o. male admitted on 11/04/2023 with obstructed urostomy and SBO.  PMH significant for bladder cancer s/p urostomy, CKD. Pharmacy has been consulted for Cefepime dosing for UTI.  Plan: Cefepime 2gm IV x 1 followed by 1gm IV q24h Follow renal function F/u culture results & sensitivities  Height: 5\' 11"  (180.3 cm) Weight: 98.5 kg (217 lb 1.6 oz) IBW/kg (Calculated) : 75.3  Temp (24hrs), Avg:100.2 F (37.9 C), Min:98.4 F (36.9 C), Max:103.5 F (39.7 C)  Recent Labs  Lab 11/04/23 1931  WBC 8.3  CREATININE 2.71*    Estimated Creatinine Clearance: 26 mL/min (A) (by C-G formula based on SCr of 2.71 mg/dL (H)).    No Known Allergies  Antimicrobials this admission: 2/25 Cefepime >>      Dose adjustments this admission:    Microbiology results: 2/25 BCx:   2/25 UCx:       Thank you for allowing pharmacy to be a part of this patient's care.  Maryellen Pile, PharmD 11/05/2023 4:52 AM

## 2023-11-05 NOTE — Op Note (Signed)
 11/04/2023 - 11/05/2023  3:34 PM  PATIENT:  Benjamin Hensley  83 y.o. male  PRE-OPERATIVE DIAGNOSIS:  SMALL BOWEL OBSTRUCTION  POST-OPERATIVE DIAGNOSIS:  SMALL BOWEL OBSTRUCTION WITH NECROSIS  PROCEDURE:  Procedure(s): EXPLORATORY LAPAROTOMY; lysis of adhesions; small bowel resection; ligation of left ureter; primary hernia repair (N/A)  SURGEON:  Surgeons and Role:    * Griselda Miner, MD - Primary    * [name], Delbert Phenix., MD - Assisting    * Fredricka Bonine, Lady Deutscher, MD - Assisting    * Crista Elliot, MD - Assisting  PHYSICIAN ASSISTANT:   ASSISTANTS: Dr. Fredricka Bonine   ANESTHESIA:   general  EBL:  minimal   BLOOD ADMINISTERED:none  DRAINS: (1) Blake drain(s) in the pelvis    LOCAL MEDICATIONS USED:  NONE  SPECIMEN:  Source of Specimen:  necrotic segment of small bowel  DISPOSITION OF SPECIMEN:  PATHOLOGY  COUNTS:  YES  TOURNIQUET:  * No tourniquets in log *  DICTATION: .Dragon Dictation  After informed consent was obtained the patient was brought to the operating room and placed in the supine position on the operating table.  After adequate induction of general anesthesia the patient's abdomen was prepped with Betadine and draped in usual sterile manner.  An appropriate timeout was performed.  A midline incision was then made with a 10 blade knife.  The incision was carried through the skin and subcutaneous tissue sharply with the electrocautery.  The patient did have a midline hernia.  We were able to enter the hernia sac easily.  We then open the rest of the incision under direct vision.  There was some filmy adhesion of small bowel and omentum in the hernia sac which was taken down easily with the Metzenbaum scissors and electrocautery.  We were then able to reflect the transverse colon and omentum upwards and in doing so we were able to identify a segment of necrotic dead small bowel.  We then ran the small bowel from the ligament of Treitz to the ileocecal valve.  We were  able to identify a thick band of adhesion down in the pelvis that was the site of obstruction.  We lysed this band sharply with the electrocautery and the obstruction was relieved.  Sites were then chosen above and below the necrotic segment of small bowel where the small bowel appeared healthy.  The mesentery to each of these points was opened sharply with the electrocautery.  The small bowel was then divided at each point with a single firing of a GIA 75 stapler.  The mesentery to the necrotic segment was then taken down sharply with the LigaSure.  The dead small bowel was removed from the patient and sent to pathology for further evaluation.  Upon further inspection of the abdominal cavity it appeared as though the tight band that was causing the obstruction was the left ureter heading to the urostomy.  The left ureter had no appreciable lumen and the left kidney was significantly atrophic and appeared to be nonfunctional.  Urology was consulted and they examined the ureter and we all agreed at that point that the ureter should be ligated.  The proximal and distal segment of healthy small bowel approximated each other easily.  The antimesenteric corner of the staple line was then excised sharply on each segment with curved Mayo's.  Each arm of a GIA 75 stapler was then placed down the appropriate limb of small bowel, clamped, and fired thereby creating a nice widely  patent enteroenterostomy.  The common opening was closed with a single firing of a TA 60 stapler.  The staple line was then imbricated with multiple 2-0 silk Lembert stitches as well as a 2-0 silk crotch stitch.  The mesentery was then closed with 2-0 silk figure-of-eight stitches.  Once this was accomplished and the anastomosis appeared healthy and widely patent.  The abdomen is then irrigated with copious amounts of saline.  The small bowel was returned to the abdominal cavity and covered with the omentum.  A small stab incision was made on the left  side of the abdomen and was used to bring a 19 [place] round Blake drain into the abdominal cavity.  The drain was placed in the pelvis near the ligated left ureter.  The right ureter appeared to be intact and functioning.  At this point the abdominal cavity was then closed with 2 running #1 double-stranded looped PDS sutures as well as multiple interrupted #1 Novafil retention sutures internally.  The hernia sac was excised sharply with the electrocautery.  The skin was left open and the subcutaneous tissue was packed with a moistened Kerlix gauze.  Sterile dressings were then applied.  The patient tolerated the procedure well.  At the end of the case all needle sponge and instrument counts were correct.  The patient will be left intubated and go straight to the ICU for further resuscitation measures.  The assistant was instrumental in visualization and completion of the case  PLAN OF CARE: Admit to inpatient   PATIENT DISPOSITION:  ICU - intubated and critically ill.   Delay start of Pharmacological VTE agent (>24hrs) due to surgical blood loss or risk of bleeding: no

## 2023-11-05 NOTE — Consult Note (Signed)
 NAME:  Benjamin Hensley, MRN:  [id number], DOB:  08-Jul-1941, LOS: 1 ADMISSION DATE:  11/04/2023 CONSULTATION DATE:  11/05/2023 REFERRING MD:  Jonathon Bellows - [facility] CHIEF COMPLAINT:  Abdominal pain, concern for developing urosepsis   History of Present Illness:  83 year old man who presented to [facility] ED 2/24 for abdominal pain. PMHx significant for HLD, SBO (s/p ex-lap/SBR 02/2022), aggressive bladder/prostate CA requiring ileal conduit (followed by Urology - Dr. 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  Patient initially presented to [facility] 2/24 for sudden onset of abdominal pain around 1200 with nausea, vomiting and chills. EMS was called and brought patient to ED. While in ED, patient was noted to be febrile to Tmax 103.78F, tachycardic to 100s, tachypneic to 20s-low 30s, BP 150s/80s. Labs were notable for WBC 8.3, Hgb 14.1, Plt 146. Na 131, K 4.3, CO2 21, Cr 2.71 (baseline variable), LFTs WNL. Trop unremarkable. UA with small Hgb, 100 protein, large leuks, rare bacteria. Blood Cx and Urine Cx pending. Empiric broad-spectrum antibiotics (cefepime/Flagyl) started.   On 2/25AM while awaiting bed placement, patient was noted to be persistently febrile to 101.71F with stable WBC but rising LA to 7.1 and Cr nearly doubled 4.45 (2.71). Hemodynamically, patient became borderline hypotensive.  PCCM consulted for possible ICU management.  Pertinent Medical History:   Past Medical History:  Diagnosis Date   Bladder cancer (HCC)    Chronic kidney disease    Dementia (HCC)    History of kidney stones    Medical history non-contributory    Meningitis    hx of as a child affected right eye   Significant Hospital Events: Including procedures, antibiotic start and stop dates in addition to other pertinent events   2/24 - Presented to [facility] with abdominal pain, n/v. Extensive urologic history. Febrile, WBC  WNL, normotensive with plan for floor admission. 2/25 - Persistently febrile with soft BP, rising LA to 7.1. PCCM consulted.  Interim History / Subjective:  PCCM consulted for ICU evaluation.  Objective:  Blood pressure 126/81, pulse (!) 128, temperature (!) 101.6 F (38.7 C), temperature source Oral, resp. rate (!) 26, height 5\' 11"  (1.803 m), weight 98.5 kg, SpO2 96%.       No intake or output data in the 24 hours ending 11/05/23 1104 Filed Weights   11/05/23 0422  Weight: 98.5 kg   Physical Examination: General: Acutely ill-appearing older man in NAD. Appears uncomfortable. HEENT: [place]/AT, anicteric sclera, PERRL, dry mucous membranes. NGT in place with dark bilious output. Neuro:  Awake, but lethargic.  Responds to verbal stimuli. Following commands consistently. Moves all 4 extremities spontaneously. Generalized weakness.  CV: Tachycardic, regular rhythm, no m/g/r. PULM: Breathing even and unlabored on RA. Lung fields CTAB anteriorly. GI: Soft, mild-moderate distention, exquisitely TTP throughout, most over lower quadrants. Hypoactive bowel sounds. +Urostomy in place with scant clear-yellow UOP, ileal conduit pink and moist. Extremities: No LE edema noted. Skin: Warm/dry, no rashes.  Resolved Hospital Problem List:    Assessment & Plan:  Undifferentiated shock, presumed septic versus hypovolemic in the setting of GI losses with SBO - Admit to ICU for close monitoring - Goal MAP > 65 - Fluid resuscitation as tolerated - Levophed titrated to goal MAP, has not required initiation at present but presume he may need this postoperatively - Trend WBC, fever curve, LA - F/u Cx data - Continue broad-spectrum antibiotics (cefepime/Flagyl)  SBO Nausea with vomiting History of SBO (s/p ex-lap/SBR  02/2022). - Operative management per CCS, plan for ex-lap today 2/25 +/- SBR +/- open abdomen - NGT to ILWS, copious output at present - Antiemetics as indicated  AKI on CKD stage  IV Recurrent pyelonephritis/bacteruria post-cystectomy Chronic hydronephrotic L kidney Low grade R ureteral stricture Aggressive bladder/prostate CA requiring ileal conduit (followed by Urology - Dr. Berneice Heinrich) - Urology consulted, appreciate recommendations - No indication for urologic surgical intervention at present - Worsening of renal indices more likely mass effect on ileal conduit from obstructed bowel; hopeful this will resolve with resolution of obstruction - If worsening from a renal standpoint, would consider percutaneous nephrostomy tube 2/26 - Renal US pending - Trend BMP - Replete electrolytes as indicated - Monitor I&Os - Avoid nephrotoxic agents as able - Ensure adequate renal perfusion  HLD - Resume statin as clinically appropriate  Dementia - Resume Zoloft as appropriate  Best Practice: (right click and "Reselect all SmartList Selections" daily)   Diet/type: NPO DVT prophylaxis: SCDs GI prophylaxis: N/A Lines: N/A Foley:  N/A - Urostomy in place with ileal conduit Code Status:  full code Last date of multidisciplinary goals of care discussion [Pending]  Labs:  CBC: Recent Labs  Lab 11/04/23 1931 11/05/23 0537  WBC 8.3 6.9  NEUTROABS 6.9  --   HGB 14.1 13.8  HCT 42.7 42.1  MCV 85.6 86.8  PLT 146* 95*   Basic Metabolic Panel: Recent Labs  Lab 11/04/23 1931 11/05/23 0537  NA 131* 136  K 4.3 4.7  CL 99 103  CO2 21* 19*  GLUCOSE 158* 172*  BUN 44* 54*  CREATININE 2.71* 4.45*  CALCIUM 8.9 9.0  MG  --  1.9  PHOS  --  3.0   GFR: Estimated Creatinine Clearance: 15.8 mL/min (A) (by C-G formula based on SCr of 4.45 mg/dL (H)). Recent Labs  Lab 11/04/23 1931 11/05/23 0537 11/05/23 1015  WBC 8.3 6.9  --   LATICACIDVEN  --   --  7.1*   Liver Function Tests: Recent Labs  Lab 11/04/23 1931  AST 21  ALT 19  ALKPHOS 58  BILITOT 0.8  PROT 7.3  ALBUMIN 3.7   No results for input(s): "LIPASE", "AMYLASE" in the last 168 hours. No results for  input(s): "AMMONIA" in the last 168 hours.  ABG: No results found for: "PHART", "PCO2ART", "PO2ART", "HCO3", "TCO2", "ACIDBASEDEF", "O2SAT"   Coagulation Profile: No results for input(s): "INR", "PROTIME" in the last 168 hours.  Cardiac Enzymes: No results for input(s): "CKTOTAL", "CKMB", "CKMBINDEX", "TROPONINI" in the last 168 hours.  HbA1C: No results found for: "HGBA1C"  CBG: No results for input(s): "GLUCAP" in the last 168 hours.  Review of Systems:   Patient is encephalopathic and/or intubated; therefore, history has been obtained from chart review.   Past Medical History:  He,  has a past medical history of Bladder cancer (HCC), Chronic kidney disease, Dementia (HCC), History of kidney stones, Medical history non-contributory, and Meningitis.   Surgical History:   Past Surgical History:  Procedure Laterality Date   ciliodional cyst  yrs ago   COLONOSCOPY WITH PROPOFOL N/A 02/26/2017   Procedure: COLONOSCOPY WITH PROPOFOL;  Surgeon: Charolett Bumpers, MD;  Location: [facility];  Service: Endoscopy;  Laterality: N/A;   colonscopy  2008   HERNIA REPAIR     LYMPH NODE DISSECTION Bilateral 09/22/2021   Procedure: LYMPH NODE DISSECTION;  Surgeon: Sebastian Ache, MD;  Location: [facility];  Service: Urology;  Laterality: Bilateral;   NEPHROLITHOTOMY Right 03/30/2022   Procedure: NEPHROLITHOTOMY  PERCUTANEOUS;  Surgeon: Sebastian Ache, MD;  Location: [facility];  Service: Urology;  Laterality: Right;  2 HRS   NEPHROLITHOTOMY Right 03/31/2022   Procedure: RIGHT ANTEGRADE NEPHROGRAM; NEPHROSTOMY TUBE PLACEMENT;  Surgeon: Sebastian Ache, MD;  Location: [facility];  Service: Urology;  Laterality: Right;   ROBOT ASSISTED LAPAROSCOPIC COMPLETE CYSTECT ILEAL CONDUIT N/A 09/22/2021   Procedure: XI ROBOTIC ASSISTED LAPAROSCOPIC COMPLETE CYSTECT ILEAL CONDUIT WITH INDOCYANINE GREEN DYE;  Surgeon: Sebastian Ache, MD;  Location: [facility];  Service: Urology;  Laterality: N/A;  6 HRS   ROBOT ASSISTED  LAPAROSCOPIC RADICAL PROSTATECTOMY N/A 09/22/2021   Procedure: XI ROBOTIC ASSISTED LAPAROSCOPIC RADICAL PROSTATECTOMY;  Surgeon: Sebastian Ache, MD;  Location: [facility];  Service: Urology;  Laterality: N/A;   TONSILLECTOMY  as child   TRANSURETHRAL RESECTION OF BLADDER TUMOR WITH MITOMYCIN-C N/A 05/02/2021   Procedure: TRANSURETHRAL RESECTION OF BLADDER TUMOR WITH GEMCITABINE;  Surgeon: Bjorn Pippin, MD;  Location: [facility];  Service: Urology;  Laterality: N/A;  GENERAL ANESTHESIA WITH PARALYSIS   UMBILICAL HERNIA REPAIR N/A 09/22/2021   Procedure: OPEN HERNIA REPAIR UMBILICAL ADULT;  Surgeon: Sebastian Ache, MD;  Location: [facility];  Service: Urology;  Laterality: N/A;   Social History:   reports that he has quit smoking. His smoking use included cigarettes. He has a 30 pack-year smoking history. He has never used smokeless tobacco. He reports that he does not currently use alcohol. He reports that he does not use drugs.   Family History:  His family history is not on file.   Allergies: No Known Allergies   Home Medications: Prior to Admission medications   Medication Sig Start Date End Date Taking? Authorizing Provider  atorvastatin (LIPITOR) 10 MG tablet Take 5 mg by mouth at bedtime. 02/17/21  Yes [provider]  acetaminophen (TYLENOL) 500 MG tablet Take 1,000 mg by mouth every 6 (six) hours as needed for moderate pain or headache.    [provider]  ketoconazole (NIZORAL) 2 % shampoo Apply 1 application. topically 2 (two) times a week.    [provider]  Multiple Vitamins-Minerals (MULTIVITAMIN GUMMIES ADULT PO) Take 1 tablet by mouth daily.    [provider]  sertraline (ZOLOFT) 50 MG tablet Take 50 mg by mouth daily. 12/11/16   [provider]    Critical care time:   The patient is critically ill with multiple organ system failure and requires high complexity decision making for assessment and support, frequent evaluation and titration of  therapies, advanced monitoring, review of radiographic studies and interpretation of complex data.   Critical Care Time devoted to patient care services, exclusive of separately billable procedures, described in this note is 41 minutes.  Tim Lair, PA-C [facility] 11/05/23 11:04 AM  Please see [email or web address] for pager details.  From 7A-7P if no response, please call [phone] After hours, please call ELink [phone]

## 2023-11-05 NOTE — Hospital Course (Addendum)
 82 yom w/ CKD 4 b/l bun/creat~  30/2.4 in 03/2022, HLD, bladder cancer, status post cystectomy and urostomy placement who presented to the ER from independent living facility via EMS due to sudden onset abdominal pain around noon 11/04/23 w/ associated nausea, vomiting, and shaking chills. In the ED:the patient was noted to be tachycardic and tachypneic.Later, he developed a fever 103.5, patient tachycardic in 130s, not hypoxic tachypneic in 20s.  Labs with hyponatremia 131. Bun/creat 44/2.7, cbc fairly stable, UA with WBC 21-50, LE large bacteria rare. Urine and blood culture sent. CT abdomen pelvis without>>revealed right lower quadrant ileal conduit. Ileal conduit is decompressed and there is bilateral hydronephrosis and hydroureter.  Left renal atrophy. CT findings suggestive of small bowel obstruction with possible vascular compromise causing small bowel wall thickening. Level of obstruction appears to be in the right lower quadrant, likely from adhesions.Obstruction may also be involving the proximal ileal conduit.   Urology and general surgery consulted, placed on cefepime and admission was requested.

## 2023-11-05 NOTE — Anesthesia Procedure Notes (Signed)
 Arterial Line Insertion Start/End2/25/2025 2:00 PM, 11/05/2023 2:10 PM Performed by: Mariann Barter, MD, anesthesiologist  Patient location: Pre-op. Preanesthetic checklist: patient identified, IV checked, site marked, risks and benefits discussed, surgical consent, monitors and equipment checked, pre-op evaluation, timeout performed and anesthesia consent Lidocaine 1% used for infiltration Right, radial was placed Catheter size: 20 G Hand hygiene performed  and maximum sterile barriers used   Attempts: 2 Procedure performed without using ultrasound guided technique. Ultrasound Notes:image(s) printed for medical record Following insertion, dressing applied. Post procedure assessment: normal and unchanged  Post procedure complications: unsuccessful attempts and second provider assisted. Patient tolerated the procedure well with no immediate complications.

## 2023-11-05 NOTE — Procedures (Signed)
 Central Venous Catheter Insertion Procedure Note  Benjamin Hensley  [id number]  1941/07/19  Date:11/05/23  Time:5:14 PM   Provider Performing:Kellsie Grindle Judie Petit Pecola Leisure   Procedure: Insertion of Non-tunneled Central Venous Catheter(36556) with US guidance (40102)   Indication(s) Medication administration  Consent Unable to obtain consent due to emergent nature of procedure. Previously discussed possibility of central access with patient's son, Benjamin Hensley, in ED; unable to reach by phone at time of line placement.  Anesthesia Topical only with 1% lidocaine   Timeout Verified patient identification, verified procedure, site/side was marked, verified correct patient position, special equipment/implants available, medications/allergies/relevant history reviewed, required imaging and test results available.  Sterile Technique Maximal sterile technique including full sterile barrier drape, hand hygiene, sterile gown, sterile gloves, mask, hair covering, sterile ultrasound probe cover (if used).  Procedure Description Area of catheter insertion was cleaned with chlorhexidine and draped in sterile fashion.  With real-time ultrasound guidance a central venous catheter was placed into the left internal jugular vein. Nonpulsatile blood flow and easy flushing noted in all ports.  The catheter was sutured in place and sterile dressing applied.    Complications/Tolerance None; patient tolerated the procedure well. Chest X-ray is ordered to verify placement for internal jugular or subclavian cannulation.   Chest x-ray is not ordered for femoral cannulation.  EBL Minimal  Specimen(s) None  Tim Lair, [place] [facility] 11/05/23 5:15 PM  Please see [email or web address] for pager details.  From 7A-7P if no response, please call [phone] After hours, please call ELink [phone]

## 2023-11-05 NOTE — Anesthesia Procedure Notes (Signed)
 Procedure Name: Intubation Date/Time: 11/05/2023 1:53 PM  Performed by: Ludwig Lean, CRNAPre-anesthesia Checklist: Patient identified, Emergency Drugs available, Suction available and Patient being monitored Patient Re-evaluated:Patient Re-evaluated prior to induction Oxygen Delivery Method: Circle system utilized Preoxygenation: Pre-oxygenation with 100% oxygen Induction Type: IV induction, Rapid sequence and Cricoid Pressure applied Laryngoscope Size: Mac and 4 Grade View: Grade I Tube type: Oral Tube size: 7.5 mm Number of attempts: 1 Airway Equipment and Method: Stylet Placement Confirmation: ETT inserted through vocal cords under direct vision, positive ETCO2 and breath sounds checked- equal and bilateral Secured at: 21 cm Tube secured with: Tape Dental Injury: Teeth and Oropharynx as per pre-operative assessment

## 2023-11-05 NOTE — Consult Note (Addendum)
 Benjamin Hensley 10/30/1940  [id number].    Requesting MD: Margo Aye, MD Chief Complaint/Reason for Consult: SBO  HPI:  Benjamin Hensley is an 83 y/o M with PMH Bladder Cancer s/p cystoprostatectomy and ileal conduit 09/22/2021 by Dr. Berneice Heinrich. Per chart review he also has a history of SBO 02/2022 requiring ex lap, extensive LOA, and SBR in [place], along with acute renal failure and nephrostomy tube placement. On 03/30/22 he underwent ureteroscopy and right ureteral stent placement by Dr. Berneice Heinrich for ureteral stricture and evacuation of non-obstructing stones. On 7/22 he required nephrostomy tube for evacuation of post-procedural urinoma.   Today in the ED the patient is unable to give me a history due to lethargy and acute illness. I ask him if he is in pain and he says yes but is unable to describe it. When I ask why he is here he says "I dont know". According to chart review he was brought in by EMS from independent living facility due to acute abdominal pain with vomiting that started at noon yesterday.   In the ED the patient is in SVT with HR 130's-140's, he is febrile (101-103), CT scan significant for decompressed RLQ ileal conduit and bilateral hydronephrosis/hydroureter. He also has SBO with a transition in RLQ. He has acute on chronic renal failure.   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 According to records he was seen by Dr. Hillery Hunter last year to discuss hernia repair and Dr. Hillery Hunter advised against repair at the time. According to Dr. Marga Hoots note, the patients SBO surgery required 6h LOA - this was told to him by the patient, not found in hospital records.  Patient is not on blood thinners.   ROS: Review of Systems  Unable to perform ROS: Critical illness    History reviewed. No pertinent family history.  Past Medical History:  Diagnosis Date   Bladder cancer (HCC)    Chronic kidney disease    Dementia (HCC)    History of kidney stones    Medical history  non-contributory    Meningitis    hx of as a child affected right eye    Past Surgical History:  Procedure Laterality Date   ciliodional cyst  yrs ago   COLONOSCOPY WITH PROPOFOL N/A 02/26/2017   Procedure: COLONOSCOPY WITH PROPOFOL;  Surgeon: Charolett Bumpers, MD;  Location: [facility];  Service: Endoscopy;  Laterality: N/A;   colonscopy  2008   HERNIA REPAIR     LYMPH NODE DISSECTION Bilateral 09/22/2021   Procedure: LYMPH NODE DISSECTION;  Surgeon: Sebastian Ache, MD;  Location: [facility];  Service: Urology;  Laterality: Bilateral;   NEPHROLITHOTOMY Right 03/30/2022   Procedure: NEPHROLITHOTOMY PERCUTANEOUS;  Surgeon: Sebastian Ache, MD;  Location: [facility];  Service: Urology;  Laterality: Right;  2 HRS   NEPHROLITHOTOMY Right 03/31/2022   Procedure: RIGHT ANTEGRADE NEPHROGRAM; NEPHROSTOMY TUBE PLACEMENT;  Surgeon: Sebastian Ache, MD;  Location: [facility];  Service: Urology;  Laterality: Right;   ROBOT ASSISTED LAPAROSCOPIC COMPLETE CYSTECT ILEAL CONDUIT N/A 09/22/2021   Procedure: XI ROBOTIC ASSISTED LAPAROSCOPIC COMPLETE CYSTECT ILEAL CONDUIT WITH INDOCYANINE GREEN DYE;  Surgeon: Sebastian Ache, MD;  Location: [facility];  Service: Urology;  Laterality: N/A;  6 HRS   ROBOT ASSISTED LAPAROSCOPIC RADICAL PROSTATECTOMY N/A 09/22/2021   Procedure: XI ROBOTIC ASSISTED LAPAROSCOPIC RADICAL PROSTATECTOMY;  Surgeon: Sebastian Ache, MD;  Location: [facility];  Service: Urology;  Laterality: N/A;   TONSILLECTOMY  as child   TRANSURETHRAL RESECTION OF BLADDER TUMOR  WITH MITOMYCIN-C N/A 05/02/2021   Procedure: TRANSURETHRAL RESECTION OF BLADDER TUMOR WITH GEMCITABINE;  Surgeon: Bjorn Pippin, MD;  Location: [facility];  Service: Urology;  Laterality: N/A;  GENERAL ANESTHESIA WITH PARALYSIS   UMBILICAL HERNIA REPAIR N/A 09/22/2021   Procedure: OPEN HERNIA REPAIR UMBILICAL ADULT;  Surgeon: Sebastian Ache, MD;  Location: [facility];  Service: Urology;  Laterality: N/A;    Social History:  reports that he has quit  smoking. His smoking use included cigarettes. He has a 30 pack-year smoking history. He has never used smokeless tobacco. He reports that he does not currently use alcohol. He reports that he does not use drugs.  Allergies: No Known Allergies  (Not in a hospital admission)    Physical Exam: Blood pressure 126/81, pulse (!) 128, temperature (!) 101.6 F (38.7 C), temperature source Oral, resp. rate (!) 26, height 5\' 11"  (1.803 m), weight 98.5 kg, SpO2 96%. General: ill appearing white elderly male laying on hospital be HEENT: head -normocephalic, atraumatic; Eyes: PERRLA, diaphoretic Neck- Trachea is midline  CV- tachycardic, regular, no m/r/g  Pulm- increased rate of breathing, breathing is non-labored. CTABL Abd- soft, urostomy R mid abomin with healthy appearing soma, scant clear urine in bag. Ventral hernia is soft and compressible. TTP epigastric region and RLQ without guarding.  GU- deferred  MSK- UE/LE symmetrical, no cyanosis, clubbing, or edema. Neuro- non-focal exam Psych- unable to assess due to illness  Skin: warm and dry, no rashes or lesions   Results for orders placed or performed during the hospital encounter of 11/04/23 (from the past 48 hours)  CBC with Differential     Status: Abnormal   Collection Time: 11/04/23  7:31 PM  Result Value Ref Range   WBC 8.3 4.0 - 10.5 K/uL   RBC 4.99 4.22 - 5.81 MIL/uL   Hemoglobin 14.1 13.0 - 17.0 g/dL   HCT 09.8 11.9 - 14.7 %   MCV 85.6 80.0 - 100.0 fL   MCH 28.3 26.0 - 34.0 pg   MCHC 33.0 30.0 - 36.0 g/dL   RDW 82.9 56.2 - 13.0 %   Platelets 146 (L) 150 - 400 K/uL   nRBC 0.0 0.0 - 0.2 %   Neutrophils Relative % 83 %   Neutro Abs 6.9 1.7 - 7.7 K/uL   Lymphocytes Relative 9 %   Lymphs Abs 0.7 0.7 - 4.0 K/uL   Monocytes Relative 7 %   Monocytes Absolute 0.6 0.1 - 1.0 K/uL   Eosinophils Relative 0 %   Eosinophils Absolute 0.0 0.0 - 0.5 K/uL   Basophils Relative 0 %   Basophils Absolute 0.0 0.0 - 0.1 K/uL   Immature  Granulocytes 1 %   Abs Immature Granulocytes 0.04 0.00 - 0.07 K/uL    Comment: Performed at [facility], [address], [place], [place] [address]  Comprehensive metabolic panel     Status: Abnormal   Collection Time: 11/04/23  7:31 PM  Result Value Ref Range   Sodium 131 (L) 135 - 145 mmol/L   Potassium 4.3 3.5 - 5.1 mmol/L   Chloride 99 98 - 111 mmol/L   CO2 21 (L) 22 - 32 mmol/L   Glucose, Bld 158 (H) 70 - 99 mg/dL    Comment: Glucose reference range applies only to samples taken after fasting for at least 8 hours.   BUN 44 (H) 8 - 23 mg/dL   Creatinine, Ser 4.69 (H) 0.61 - 1.24 mg/dL   Calcium 8.9 8.9 - 62.9 mg/dL  Total Protein 7.3 6.5 - 8.1 g/dL   Albumin 3.7 3.5 - 5.0 g/dL   AST 21 15 - 41 U/L   ALT 19 0 - 44 U/L   Alkaline Phosphatase 58 38 - 126 U/L   Total Bilirubin 0.8 0.0 - 1.2 mg/dL   GFR, Estimated 23 (L) >60 mL/min    Comment: (NOTE) Calculated using the CKD-EPI Creatinine Equation (2021)    Anion gap 11 5 - 15    Comment: Performed at [facility], [address], [place], [place] [address]  Troponin I (High Sensitivity)     Status: None   Collection Time: 11/04/23  7:31 PM  Result Value Ref Range   Troponin I (High Sensitivity) 7 <18 ng/L    Comment: (NOTE) Elevated high sensitivity troponin I (hsTnI) values and significant  changes across serial measurements may suggest ACS but many other  chronic and acute conditions are known to elevate hsTnI results.  Refer to the "Links" section for chest pain algorithms and additional  guidance. Performed at [facility], [address], [place], [place] [address]   Urinalysis, Routine w reflex microscopic -Urine, Unspecified Source     Status: Abnormal   Collection Time: 11/05/23  1:01 AM  Result Value Ref Range   Color, Urine YELLOW YELLOW   APPearance CLOUDY (A) CLEAR   Specific Gravity, Urine 1.009 1.005 - 1.030   pH 8.0 5.0 - 8.0   Glucose, UA  NEGATIVE NEGATIVE mg/dL   Hgb urine dipstick SMALL (A) NEGATIVE   Bilirubin Urine NEGATIVE NEGATIVE   Ketones, ur NEGATIVE NEGATIVE mg/dL   Protein, ur 425 (A) NEGATIVE mg/dL   Nitrite NEGATIVE NEGATIVE   Leukocytes,Ua LARGE (A) NEGATIVE   RBC / HPF 6-10 0 - 5 RBC/hpf   WBC, UA 21-50 0 - 5 WBC/hpf   Bacteria, UA RARE (A) NONE SEEN   Squamous Epithelial / HPF 0-5 0 - 5 /HPF   Mucus PRESENT     Comment: Performed at [facility], [address] [place], [place] [address]  Culture, blood (Routine X 2) w Reflex to ID Panel     Status: None (Preliminary result)   Collection Time: 11/05/23  1:15 AM   Specimen: BLOOD  Result Value Ref Range   Specimen Description      BLOOD RIGHT ANTECUBITAL Performed at [facility], [address] [place], [place] [address]    Special Requests      BOTTLES DRAWN AEROBIC AND ANAEROBIC Blood Culture results may not be optimal due to an inadequate volume of blood received in culture bottles Performed at [facility], [address], [place], [place] [address]    Culture      NO GROWTH < 12 HOURS Performed at [facility], [address], [place], [place] [address]    Report Status PENDING   Culture, blood (Routine X 2) w Reflex to ID Panel     Status: None (Preliminary result)   Collection Time: 11/05/23  1:16 AM   Specimen: BLOOD RIGHT HAND  Result Value Ref Range   Specimen Description      BLOOD RIGHT HAND Performed at [facility], [address], [place], [place] [address]    Special Requests      BOTTLES DRAWN AEROBIC AND ANAEROBIC Blood Culture results may not be optimal due to an inadequate volume of blood received in culture bottles Performed at [facility], [address] [place], [place] [address]    Culture      NO GROWTH < 12 HOURS Performed at [facility], [address] [place], [place] [address]    Report Status PENDING   CBC      Status: Abnormal   Collection Time: 11/05/23  5:37 AM  Result Value Ref Range   WBC 6.9 4.0 - 10.5 K/uL   RBC 4.85 4.22 - 5.81 MIL/uL   Hemoglobin 13.8 13.0 - 17.0 g/dL   HCT 29.5 62.1 - 30.8 %   MCV 86.8 80.0 - 100.0 fL   MCH 28.5 26.0 - 34.0 pg   MCHC 32.8 30.0 - 36.0 g/dL   RDW 65.7 84.6 - 96.2 %   Platelets 95 (L) 150 - 400 K/uL    Comment: SPECIMEN CHECKED FOR CLOTS DELTA CHECK NOTED PLATELET COUNT CONFIRMED BY SMEAR    nRBC 0.0 0.0 - 0.2 %    Comment: Performed at [facility], [address], [place], [place] [address]  Basic metabolic panel     Status: Abnormal   Collection Time: 11/05/23  5:37 AM  Result Value Ref Range   Sodium 136 135 - 145 mmol/L   Potassium 4.7 3.5 - 5.1 mmol/L   Chloride 103 98 - 111 mmol/L   CO2 19 (L) 22 - 32 mmol/L   Glucose, Bld 172 (H) 70 - 99 mg/dL    Comment: Glucose reference range applies only to samples taken after fasting for at least 8 hours.   BUN 54 (H) 8 - 23 mg/dL   Creatinine, Ser 1.32 (H) 0.61 - 1.24 mg/dL    Comment: DELTA CHECK NOTED   Calcium 9.0 8.9 - 10.3 mg/dL   GFR, Estimated 13 (L) >60 mL/min    Comment: (NOTE) Calculated using the CKD-EPI Creatinine Equation (2021)    Anion gap 14 5 - 15    Comment: Performed at [facility], [address], [place], [place] [address]  Magnesium     Status: None   Collection Time: 11/05/23  5:37 AM  Result Value Ref Range   Magnesium 1.9 1.7 - 2.4 mg/dL    Comment: Performed at [facility], [address], [place], [place] [address]  Phosphorus     Status: None   Collection Time: 11/05/23  5:37 AM  Result Value Ref Range   Phosphorus 3.0 2.5 - 4.6 mg/dL    Comment: Performed at [facility], [address] [place], [place] [address]   DG Abd Portable 1V-Small Bowel Protocol-Position Verification Result Date: 11/05/2023 CLINICAL DATA:  NG tube placement EXAM: PORTABLE ABDOMEN - 1 VIEW COMPARISON:  CT  today. FINDINGS: NG tube tip is in the stomach with the side port near the GE junction. IMPRESSION: NG tube in the proximal stomach. Electronically Signed   By: Charlett Nose M.D.   On: 11/05/2023 01:37   CT ABDOMEN PELVIS WO CONTRAST Result Date: 11/04/2023 CLINICAL DATA:  Acute nonlocalized abdominal pain. Worsening right upper quadrant abdominal pain tonight. Urostomy not draining. EXAM: CT ABDOMEN AND PELVIS WITHOUT CONTRAST TECHNIQUE: Multidetector CT imaging of the abdomen and pelvis was performed following the standard protocol without IV contrast. RADIATION DOSE REDUCTION: This exam was performed according to the departmental dose-optimization program which includes automated exposure control, adjustment of the mA and/or kV according to patient size and/or use of iterative reconstruction technique. COMPARISON:  08/15/2023 FINDINGS: Lower chest: Mild atelectasis in the lung bases. Small esophageal  hiatal hernia. Hepatobiliary: Minimal free fluid along the anterior liver edge. No focal liver lesions. Gallbladder and bile ducts are unremarkable. Pancreas: Unremarkable. No pancreatic ductal dilatation or surrounding inflammatory changes. Spleen: Calcified granulomas in the spleen.  Normal-size. Adrenals/Urinary Tract: No adrenal gland nodules. Diffuse parenchymal atrophy involving the left kidney. Bilateral hydronephrosis and hydroureter. A right lower quadrant urinary conduit is present. The urinary conduit is decompressed. Bladder is surgically absent. Stomach/Bowel: Stomach is fluid-filled without abnormal distention. Proximal small bowel are decompressed. Mid abdominal and right lower quadrant small bowel loops are dilated and fluid-filled with thickened wall. There appears to be tethering in the right lower quadrant likely indicating an area of adhesions. This is near the apex of the ileal conduit. Scattered stool throughout the colon. There is a ventral abdominal wall hernia at the umbilicus containing  fat, cecum, and terminal ileum. There is no obvious obstruction at this level. Multiple areas of surgical anastomosis are demonstrated in small bowel. Vascular/Lymphatic: Aortic atherosclerosis. No enlarged abdominal or pelvic lymph nodes. Reproductive: Prostate gland appears to be surgically absent. Other: Small amount of free fluid in the mesentery surrounding dilated bowel loops. No free air. Musculoskeletal: Degenerative changes in the spine. No acute bony abnormalities. IMPRESSION: 1. Right lower quadrant ileal conduit. Ileal conduit is decompressed and there is bilateral hydronephrosis and hydroureter. Left renal atrophy. 2. Dilated mid to lower abdominal small bowel with small bowel wall thickening in this area and mesenteric fluid. No pneumatosis. 3. Changes are likely to represent small bowel obstruction with possible vascular compromise causing small-bowel wall thickening. Level of obstruction appears to be in the right lower quadrant, likely adhesions. Obstruction may also be involving the proximal ileal conduit. 4. Aortic atherosclerosis. These results were called by telephone at the time of interpretation on 11/04/2023 at 9:41 pm to provider [facility] , who verbally acknowledged these results. Electronically Signed   By: Burman Nieves M.D.   On: 11/04/2023 21:45   DG Chest Portable 1 View Result Date: 11/04/2023 CLINICAL DATA:  Worsening right upper quadrant pain EXAM: PORTABLE CHEST 1 VIEW COMPARISON:  08/13/2023 FINDINGS: Single frontal view of the chest demonstrates an unremarkable cardiac silhouette. No airspace disease, effusion, or pneumothorax. No acute bony abnormalities. IMPRESSION: 1. No acute intrathoracic process. Electronically Signed   By: Sharlet Salina M.D.   On: 11/04/2023 21:10      Assessment/Plan 83 y/o male with complex surgical history and history of bladder cancer who presents with abdominal pain and vomiting that reportedly started 2/24 around lunchtime. In the  ED he is ill appearing with tachycardia, tachypnea, fever, and systolic BP 120's. His CT scan is concerning for both right ureteral obstruction as well as SBO with transition point RLQ near the ileal conduit. 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 Unclear if his illness is due to SBO with possible bowel ischemia vs ureteral obstruction (which appears to be due to external compression from bowel). WBC is WNL. Lactate is pending. He has an NG tube in place that needs to be advanced.  Will follow the patients exam closely, along with lacate level, and his response to fluid resuscutation and NG tube decompression. Will speak to urology colleagues. If he does not improve or has more convinving evidence of small bowel ischemia he may need a laparotomy which would likely be high risk in this frail 83 y/o M with  known hostile abdomen.     I reviewed nursing notes, ED provider notes, Consultant urology notes, hospitalist notes, last 24 h vitals and pain scores, last 48 h intake and output, last 24 h labs and trends, and last 24 h imaging results.  Adam Phenix, [facility] 11/05/2023, 9:57 AM Please see Amion for pager number during day hours 7:00am-4:30pm or 7:00am -11:30am on weekends

## 2023-11-05 NOTE — Op Note (Addendum)
 Operative Note  Preoperative diagnosis:  1.  Left ureteral injury  Postoperative diagnosis: 1.  Left ureteral injury  Procedure(s): 1.  Exploratory laparotomy with ligation of left distal ureter and repair of ileal conduit   Surgeon: Modena Slater, MD  Assistants: Sebastian Ache, MD--an assistant was necessary for the procedure for critical portions of the case including retraction and inspection of the abdomen.  Anesthesia: General  Complications: None immediate  EBL: Minimal for our portion  Specimens: 1.  None  Drains/Catheters: 1.  None  Intraoperative findings: The distal left ureter had been divided at the level of the ureteroileal anastomosis.  The distal ureter was fibrotic and calcified consistent with chronic obstructive process.  There was no efflux of urine from the ureter.  Indication: 83 year old male with a history of bladder cancer status post cystoprostatectomy with ileal conduit in 2023.  He has a history of small bowel obstruction requiring laparotomy and lysis of adhesion.  He presented with severe sepsis and acute abdomen.  He was taken the operating room for exploratory laparotomy where he was found to have a large portion of dead bowel.  There was adhesion in the right lower quadrant causing the issue.  After release of the adhesion, there was concern for division of the left ureter at the level of the anastomosis.  Therefore, urology was contacted to evaluate.  Description of procedure:  Upon entering the room, the patient was in supine position.  General surgery had performed exploratory laparotomy with excision of bowel with reanastomosis.  The conduit was pink and patent at the skin.  I inspected the intraabdominal portion of the conduit.  The left ureter had been ligated at the level of the ureteroileal anastomosis.  We reviewed the CT scan.  The patient had an atrophic left kidney with minimal parenchyma.  We inspected the distal left ureter and this was  severely calcified/fibrotic and diseased consistent with chronic obstructive process.  The lumen was tiny and barely able to pass a sensor wire through.  We opened the lumen with Potts scissors and the opened distal lumen appeared diseased and fibrotic as well.  Given the patient's severe sepsis and the finding of left renal atrophy likely representing a nonfunctional kidney, we elected to ligate the distal left ureter with a clip rather than reanastomose the diseased portion of ureter back into the ileal conduit.  We felt this to be the safest approach given the patient's critical condition and likely nonfunctional status of left kidney.  We closed the conduit at the level of the anastomosis with a 4-0 Vicryl.  This concluded our portion of the operation.  Plan: Drain will be left at the conclusion of the case.  Will continue to follow the patient.

## 2023-11-05 NOTE — Progress Notes (Signed)
 PROGRESS NOTE Benjamin Hensley  WRU:[id number] DOB: 15-Jun-1941 DOA: 11/04/2023 PCP: Thana Ates, MD  Brief Narrative/Hospital Course: 41 yom w/ CKD 4 b/l bun/creat~  30/2.4 in 03/2022, HLD, bladder cancer, status post cystectomy and urostomy placement who presented to the ER from independent living facility via EMS due to sudden onset abdominal pain around noon 11/04/23 w/ associated nausea, vomiting, and shaking chills. In the ED:the patient was noted to be tachycardic and tachypneic.Later, he developed a fever 103.5, patient tachycardic in 130s, not hypoxic tachypneic in 20s.  Labs with hyponatremia 131. Bun/creat 44/2.7, cbc fairly stable, UA with WBC 21-50, LE large bacteria rare. Urine and blood culture sent. CT abdomen pelvis without>>revealed right lower quadrant ileal conduit. Ileal conduit is decompressed and there is bilateral hydronephrosis and hydroureter.  Left renal atrophy. CT findings suggestive of small bowel obstruction with possible vascular compromise causing small bowel wall thickening. Level of obstruction appears to be in the right lower quadrant, likely from adhesions.  Obstruction may also be involving the proximal ileal conduit.   Urology and general surgery consulted, placed on cefepime and admission was requested.   Subjective:  Seen in ED he is tachycardic febrile and tacypneic now. Overnight patient has been tachycardic, Tmax 103.5 Repeat lab shows worsening renal failure, cbc w/ thrombocytopenia 146>95k NGT+ w/ canister half full. Lethargic able to wake up and respond.   Assessment and Plan: Principal Problem:   Generalized abdominal pain Active Problems:   AKI (acute kidney injury) (HCC)   Sepsis (HCC)  Severe Sepsis POA Unclear source -Likely abdomen or urinary: On presentation-fever tachycardia tachypnea, ua abnormal, CT findings shows bilateral hydronephrosis, SBO w/possible vascular compromise. Urine & Blood Cx sent. Continue empiric ceftriaxone for  now.  Monitor hemodynamics closely.  Given fever and tachycardia with soft BP will do bolus  NSS,Tylenol, check lactic acid and admit to stepdown/ICU. Added flagyl. Discussed w/ PCCm  Generalized abdominal pain Small bowel obstruction with possible vascular compromise Concern for bowel ischemia Constipation: CCS has been consulted keep NPO.Continue IV fluids, IV antibiotics, NGT decompression, monitor lactic acid-ordered stat. Discussed with surgery team this morning. Adendum: LACTATE:7- I came to reevaluate him at 10:50 am- Dr Carolynne Edouard also walked in to room: contemplating on OR stat. Just done with Bolus 1 L- I  SBP in 80s- MAOP>74- called RN to run one more bolus ivf. 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 Creatinine much worse overnight,continue aggressive fluid hydration avoid nephrotoxic medication.  Will consult nephrology-spoke w/ Dr Malen Gauze.Patient shows bilateral hydronephrosis - urology has been consulted - obstruction may involve ileal conduit. Recent Labs    11/04/23 1931 11/05/23 0537  BUN 44* 54*  CREATININE 2.71* 4.45*  CO2 21* 19*  K 4.3 4.7    Tachycardia/SVT: HR 140s fever of 101.6 this am-BP soft.We will do bolus ivf 2 liter, check lactic acid.  Metabolic acidosis: 2/2 AKI sepsis, monitor labs.  Thrombocytopenia: Platelet worsening further likely setting of sepsis.Monitor while on Lovenox. Recent Labs  Lab 11/04/23 1931 11/05/23 0537  PLT 146* 95*     Hyperglycemia: Fu labs A1c. Cbg  172 this am  HLD: Hold po meds.  Acute metabolic encephalopathy Generalized weakness/debility: Patient more lethargic and less responsive this morning.Will transfer to ICU.Discussed with critical care.  CODE STATUS: Currently full Code.Overall prognosis guarded at this time will admit to stepdown unit I discussed with  surgery team,  urology. I called and updated his spouse and son and explained on clinic situation. He is full code.   Obesity class I: Patient's Body mass index is 30.28 kg/m. : Will benefit with PCP follow-up, weight loss  healthy lifestyle and outpatient sleep evaluation.   DVT prophylaxis: enoxaparin (LOVENOX) injection 30 mg Start: 11/05/23 1000 Code Status:   Code Status: Full Code Family Communication: plan of care discussed with patient/NONE at bedside. Patient status is: Remains hospitalized because of severity of illness Level of care: Stepdown   Dispo: The patient is from: HOME            Anticipated disposition: TBD Objective: Vitals last 24 hrs: Vitals:   11/05/23 0600 11/05/23 0630 11/05/23 0730 11/05/23 0934  BP: 122/79 127/89 126/81   Pulse: (!) 135 (!) 129 (!) 128   Resp: (!) 33 (!) 33 (!) 26   Temp:    (!) 101.6 F (38.7 C)  TempSrc:    Oral  SpO2: 94% 96% 96%   Weight:      Height:       Weight change:   Physical Examination: General exam:  lethargic  HEENT:Oral mucosa moist, Ear/Nose WNL grossly Respiratory system: Bilaterally clear BS,no use of accessory muscle Cardiovascular system: S1 & S2 +, No JVD. Gastrointestinal system: Abdomen distended ileal conduit/urostomy present with small amount of clear urine distended abdomen, tender+++ Nervous System: Lethargic able to wake up  and answer some questions  Extremities: LE edema neg,distal peripheral pulses palpable and warm.  Skin: No rashes,no icterus. MSK: weak muscle bulk,tone, power   Medications reviewed:  Scheduled Meds:  enoxaparin (LOVENOX) injection  30 mg Subcutaneous Q24H   Continuous Infusions:  [START ON 11/06/2023] ceFEPime (MAXIPIME) IV     lactated ringers 100 mL/hr at 11/05/23 1008      Diet Order             Diet NPO time specified Except for: Ice Chips  Diet effective now                  No intake or output data in the 24 hours ending 11/05/23 1012 Net IO Since Admission: No IO  data has been entered for this period [11/05/23 1012]  Wt Readings from Last 3 Encounters:  11/05/23 98.5 kg  11/19/22 81.6 kg  03/30/22 79.4 kg     Unresulted Labs (From admission, onward)     Start     Ordered   11/11/23 0500  Creatinine, serum  (enoxaparin (LOVENOX)    CrCl >/= 30 ml/min)  Weekly,   R     Comments: while on enoxaparin therapy    11/04/23 2234   11/06/23 0500  CBC  Tomorrow morning,   R        11/05/23 0630   11/06/23 0500  Renal function panel  Tomorrow morning,   R        11/05/23 0630   11/05/23 0940  Lactic acid, plasma  (Lactic Acid)  STAT Now then every 3 hours,   R      11/05/23 7829   11/04/23 2258  Urine Culture (for pregnant, neutropenic or urologic patients or patients with an indwelling urinary catheter)  (Urine Labs)  ONCE - URGENT,   URGENT       Question Answer Comment  Indication Bacteriuria screening (OB/GYN or Uro)   Patient immune status Immunocompromised      11/04/23 2258  Data Reviewed: I have personally reviewed following labs and imaging studies CBC: Recent Labs  Lab 11/04/23 1931 11/05/23 0537  WBC 8.3 6.9  NEUTROABS 6.9  --   HGB 14.1 13.8  HCT 42.7 42.1  MCV 85.6 86.8  PLT 146* 95*   Basic Metabolic Panel:  Recent Labs  Lab 11/04/23 1931 11/05/23 0537  NA 131* 136  K 4.3 4.7  CL 99 103  CO2 21* 19*  GLUCOSE 158* 172*  BUN 44* 54*  CREATININE 2.71* 4.45*  CALCIUM 8.9 9.0  MG  --  1.9  PHOS  --  3.0   GFR: Estimated Creatinine Clearance: 15.8 mL/min (A) (by C-G formula based on SCr of 4.45 mg/dL (H)). Liver Function Tests:  Recent Labs  Lab 11/04/23 1931  AST 21  ALT 19  ALKPHOS 58  BILITOT 0.8  PROT 7.3  ALBUMIN 3.7  No results for input(s): "PROCALCITON", "LATICACIDVEN" in the last 168 hours. Recent Results (from the past 240 hours)  Culture, blood (Routine X 2) w Reflex to ID Panel     Status: None (Preliminary result)   Collection Time: 11/05/23  1:15 AM   Specimen: BLOOD  Result Value  Ref Range Status   Specimen Description   Final    BLOOD RIGHT ANTECUBITAL Performed at [facility], [address] [place], [place] [address]    Special Requests   Final    BOTTLES DRAWN AEROBIC AND ANAEROBIC Blood Culture results may not be optimal due to an inadequate volume of blood received in culture bottles Performed at [facility], [address] [place], [place] [address]    Culture   Final    NO GROWTH < 12 HOURS Performed at [facility], [address], [place], [place] [address]    Report Status PENDING  Incomplete  Culture, blood (Routine X 2) w Reflex to ID Panel     Status: None (Preliminary result)   Collection Time: 11/05/23  1:16 AM   Specimen: BLOOD RIGHT HAND  Result Value Ref Range Status   Specimen Description   Final    BLOOD RIGHT HAND Performed at [facility], [address], [place], [place] [address]    Special Requests   Final    BOTTLES DRAWN AEROBIC AND ANAEROBIC Blood Culture results may not be optimal due to an inadequate volume of blood received in culture bottles Performed at [facility], [address], [place], [place] [address]    Culture   Final    NO GROWTH < 12 HOURS Performed at [facility], [address], [place], [place] [address]    Report Status PENDING  Incomplete    Antimicrobials/Microbiology: Anti-infectives (From admission, onward)    Start     Dose/Rate Route Frequency Ordered Stop   11/06/23 0400  ceFEPIme (MAXIPIME) 1 g in sodium chloride 0.9 % 100 mL IVPB        1 g 200 mL/hr over 30 Minutes Intravenous Every 24 hours 11/05/23 0456     11/05/23 0345  ceFEPIme (MAXIPIME) 2 g in sodium chloride 0.9 % 100 mL IVPB        2 g 200 mL/hr over 30 Minutes Intravenous  Once 11/05/23 0331 11/05/23 0424         Component Value Date/Time   SDES  11/05/2023 0116    BLOOD RIGHT HAND Performed at [facility], [address]  [place], [place] [address]  SPECREQUEST  11/05/2023 0116    BOTTLES DRAWN AEROBIC AND ANAEROBIC Blood Culture results may not be optimal due to an inadequate volume of blood received in culture bottles Performed at [facility], [address] [place], [place] [address]    CULT  11/05/2023 0116    NO GROWTH < 12 HOURS Performed at [facility], [address] [place], [place] [address]    REPTSTATUS PENDING 11/05/2023 0116     Radiology Studies: DG Abd Portable 1V-Small Bowel Protocol-Position Verification Result Date: 11/05/2023 CLINICAL DATA:  NG tube placement EXAM: PORTABLE ABDOMEN - 1 VIEW COMPARISON:  CT today. FINDINGS: NG tube tip is in the stomach with the side port near the GE junction. IMPRESSION: NG tube in the proximal stomach. Electronically Signed   By: Charlett Nose M.D.   On: 11/05/2023 01:37   CT ABDOMEN PELVIS WO CONTRAST Result Date: 11/04/2023 CLINICAL DATA:  Acute nonlocalized abdominal pain. Worsening right upper quadrant abdominal pain tonight. Urostomy not draining. EXAM: CT ABDOMEN AND PELVIS WITHOUT CONTRAST TECHNIQUE: Multidetector CT imaging of the abdomen and pelvis was performed following the standard protocol without IV contrast. RADIATION DOSE REDUCTION: This exam was performed according to the departmental dose-optimization program which includes automated exposure control, adjustment of the mA and/or kV according to patient size and/or use of iterative reconstruction technique. COMPARISON:  08/15/2023 FINDINGS: Lower chest: Mild atelectasis in the lung bases. Small esophageal hiatal hernia. Hepatobiliary: Minimal free fluid along the anterior liver edge. No focal liver lesions. Gallbladder and bile ducts are unremarkable. Pancreas: Unremarkable. No pancreatic ductal dilatation or surrounding inflammatory changes. Spleen: Calcified granulomas in the spleen.  Normal-size. Adrenals/Urinary Tract: No adrenal gland nodules. Diffuse parenchymal  atrophy involving the left kidney. Bilateral hydronephrosis and hydroureter. A right lower quadrant urinary conduit is present. The urinary conduit is decompressed. Bladder is surgically absent. Stomach/Bowel: Stomach is fluid-filled without abnormal distention. Proximal small bowel are decompressed. Mid abdominal and right lower quadrant small bowel loops are dilated and fluid-filled with thickened wall. There appears to be tethering in the right lower quadrant likely indicating an area of adhesions. This is near the apex of the ileal conduit. Scattered stool throughout the colon. There is a ventral abdominal wall hernia at the umbilicus containing fat, cecum, and terminal ileum. There is no obvious obstruction at this level. Multiple areas of surgical anastomosis are demonstrated in small bowel. Vascular/Lymphatic: Aortic atherosclerosis. No enlarged abdominal or pelvic lymph nodes. Reproductive: Prostate gland appears to be surgically absent. Other: Small amount of free fluid in the mesentery surrounding dilated bowel loops. No free air. Musculoskeletal: Degenerative changes in the spine. No acute bony abnormalities. IMPRESSION: 1. Right lower quadrant ileal conduit. Ileal conduit is decompressed and there is bilateral hydronephrosis and hydroureter. Left renal atrophy. 2. Dilated mid to lower abdominal small bowel with small bowel wall thickening in this area and mesenteric fluid. No pneumatosis. 3. Changes are likely to represent small bowel obstruction with possible vascular compromise causing small-bowel wall thickening. Level of obstruction appears to be in the right lower quadrant, likely adhesions. Obstruction may also be involving the proximal ileal conduit. 4. Aortic atherosclerosis. These results were called by telephone at the time of interpretation on 11/04/2023 at 9:41 pm to provider [facility] , who verbally acknowledged these results. Electronically Signed   By: Burman Nieves M.D.   On:  11/04/2023 21:45   DG Chest Portable 1 View Result Date: 11/04/2023 CLINICAL DATA:  Worsening right upper quadrant pain  EXAM: PORTABLE CHEST 1 VIEW COMPARISON:  08/13/2023 FINDINGS: Single frontal view of the chest demonstrates an unremarkable cardiac silhouette. No airspace disease, effusion, or pneumothorax. No acute bony abnormalities. IMPRESSION: 1. No acute intrathoracic process. Electronically Signed   By: Sharlet Salina M.D.   On: 11/04/2023 21:10   LOS: 1 day   Total time spent in review of labs and imaging, patient evaluation, formulation of plan, documentation and communication with family: 50 minutes  Lanae Boast, MD  [facility]  11/05/2023, 10:12 AM

## 2023-11-05 NOTE — Consult Note (Signed)
 [facility] Renal Consultation Note  Requesting MD: Lanae Boast Indication for Consultation:  AKI  Chief complaint: n/v and fevers  HPI:  Benjamin Hensley is a 83 y.o. male with a history of CKD stage IV, obstructive uropathy and bladder/prostate cancer presented to the hospital with nausea and vomiting as well as fever.  He has a bladder conduit in place for known bilateral hydronephrosis.  He was found to have a small bowel obstruction.  Note that the bowel obstruction may also involve the ileal conduit per imaging.  Surgery and urology were consulted.  Nephrology is consulted for assistance with management of AKI. Earlier today he underwent ex-lap with ligation of left distal ureter and repair of ileal conduit.  Also noted patient with small bowel resection with surgery today in combined case.  He has been ordered for a bicarb gtt - pharmacy is sending this up.  He has been transferred to the ICU.  He is intubated and on levophed at 10 mcg/min and phenylephrine at 70 mcg/min.  Transitioning to levo per RN report.  He follows with Dr. Berneice Heinrich in urology.  I spoke with his wife via phone.  He does not have a nephrologist.  Per his wife the patient has no history of dementia though this is charted.         Creatinine, Ser  Date/Time Value Ref Range Status  11/05/2023 05:37 AM 4.45 (H) 0.61 - 1.24 mg/dL Final    Comment:    DELTA CHECK NOTED  11/04/2023 07:31 PM 2.71 (H) 0.61 - 1.24 mg/dL Final  52/84/1324 40:10 AM 2.49 (H) 0.61 - 1.24 mg/dL Final  27/25/3664 40:34 AM 3.39 (H) 0.61 - 1.24 mg/dL Final  74/25/9563 87:56 AM 4.77 (H) 0.61 - 1.24 mg/dL Final    Comment:    DELTA CHECK NOTED  03/30/2022 06:09 AM 2.25 (H) 0.61 - 1.24 mg/dL Final  43/32/9518 84:16 AM 1.37 (H) 0.61 - 1.24 mg/dL Final  60/63/0160 10:93 AM 1.67 (H) 0.61 - 1.24 mg/dL Final  23/55/7322 02:54 AM 1.70 (H) 0.61 - 1.24 mg/dL Final  27/02/2375 28:31 AM 1.71 (H) 0.61 - 1.24 mg/dL Final  51/76/1607 37:10 AM 1.41 (H)  0.61 - 1.24 mg/dL Final  62/69/4854 62:70 PM 1.45 (H) 0.61 - 1.24 mg/dL Final  35/00/9381 82:99 AM 1.12 0.61 - 1.24 mg/dL Final  37/16/9678 93:81 AM 1.12 0.61 - 1.24 mg/dL Final  01/75/1025 85:27 AM 1.20 0.61 - 1.24 mg/dL Final  78/24/2353 61:44 AM 1.34 (H) 0.61 - 1.24 mg/dL Final  31/54/0086 76:19 AM 1.42 (H) 0.61 - 1.24 mg/dL Final  50/93/2671 24:58 PM 1.24 0.61 - 1.24 mg/dL Final  09/98/3382 50:53 AM 1.28 (H) 0.61 - 1.24 mg/dL Final     PMHx:   Past Medical History:  Diagnosis Date   Bladder cancer (HCC)    Chronic kidney disease    Dementia (HCC)    History of kidney stones    Medical history non-contributory    Meningitis    hx of as a child affected right eye  (Note that the diagnosis of dementia is not correct per the patient's wife)  Past Surgical History:  Procedure Laterality Date   ciliodional cyst  yrs ago   COLONOSCOPY WITH PROPOFOL N/A 02/26/2017   Procedure: COLONOSCOPY WITH PROPOFOL;  Surgeon: Charolett Bumpers, MD;  Location: [facility];  Service: Endoscopy;  Laterality: N/A;   colonscopy  2008   HERNIA REPAIR     LYMPH NODE DISSECTION Bilateral 09/22/2021   Procedure:  LYMPH NODE DISSECTION;  Surgeon: Sebastian Ache, MD;  Location: [facility];  Service: Urology;  Laterality: Bilateral;   NEPHROLITHOTOMY Right 03/30/2022   Procedure: NEPHROLITHOTOMY PERCUTANEOUS;  Surgeon: Sebastian Ache, MD;  Location: [facility];  Service: Urology;  Laterality: Right;  2 HRS   NEPHROLITHOTOMY Right 03/31/2022   Procedure: RIGHT ANTEGRADE NEPHROGRAM; NEPHROSTOMY TUBE PLACEMENT;  Surgeon: Sebastian Ache, MD;  Location: [facility];  Service: Urology;  Laterality: Right;   ROBOT ASSISTED LAPAROSCOPIC COMPLETE CYSTECT ILEAL CONDUIT N/A 09/22/2021   Procedure: XI ROBOTIC ASSISTED LAPAROSCOPIC COMPLETE CYSTECT ILEAL CONDUIT WITH INDOCYANINE GREEN DYE;  Surgeon: Sebastian Ache, MD;  Location: [facility];  Service: Urology;  Laterality: N/A;  6 HRS   ROBOT ASSISTED LAPAROSCOPIC RADICAL  PROSTATECTOMY N/A 09/22/2021   Procedure: XI ROBOTIC ASSISTED LAPAROSCOPIC RADICAL PROSTATECTOMY;  Surgeon: Sebastian Ache, MD;  Location: [facility];  Service: Urology;  Laterality: N/A;   TONSILLECTOMY  as child   TRANSURETHRAL RESECTION OF BLADDER TUMOR WITH MITOMYCIN-C N/A 05/02/2021   Procedure: TRANSURETHRAL RESECTION OF BLADDER TUMOR WITH GEMCITABINE;  Surgeon: Bjorn Pippin, MD;  Location: [facility];  Service: Urology;  Laterality: N/A;  GENERAL ANESTHESIA WITH PARALYSIS   UMBILICAL HERNIA REPAIR N/A 09/22/2021   Procedure: OPEN HERNIA REPAIR UMBILICAL ADULT;  Surgeon: Sebastian Ache, MD;  Location: [facility];  Service: Urology;  Laterality: N/A;    Family Hx: History reviewed. No pertinent family history. Unable to obtain 2/2 patient intubated.  Per his wife no known history of ESRD    Social History:  reports that he has quit smoking. His smoking use included cigarettes. He has a 30 pack-year smoking history. He has never used smokeless tobacco. He reports that he does not currently use alcohol. He reports that he does not use drugs.  Allergies: No Known Allergies  Medications: Prior to Admission medications   Medication Sig Start Date End Date Taking? Authorizing Provider  acetaminophen (TYLENOL) 500 MG tablet Take 1,000 mg by mouth every 6 (six) hours as needed for moderate pain or headache.   Yes [provider]  atorvastatin (LIPITOR) 10 MG tablet Take 5 mg by mouth in the morning. 02/17/21  Yes [provider]  Multiple Vitamins-Minerals (MULTIVITAMIN GUMMIES ADULT PO) Take 1 tablet by mouth in the morning.   Yes [provider]  sertraline (ZOLOFT) 50 MG tablet Take 50 mg by mouth in the morning. 12/11/16  Yes [provider]  ketoconazole (NIZORAL) 2 % shampoo Apply 1 application  topically as needed for irritation.    [provider]   I have reviewed the patient's current and reported prior to admission medications.  Labs:     Latest Ref  Rng & Units 11/05/2023    3:01 PM 11/05/2023    2:20 PM 11/05/2023    5:37 AM  BMP  Glucose 70 - 99 mg/dL   578   BUN 8 - 23 mg/dL   54   Creatinine 4.69 - 1.24 mg/dL   6.29   Sodium 528 - 413 mmol/L 141  140  136   Potassium 3.5 - 5.1 mmol/L 4.1  4.0  4.7   Chloride 98 - 111 mmol/L   103   CO2 22 - 32 mmol/L   19   Calcium 8.9 - 10.3 mg/dL   9.0     Urinalysis    Component Value Date/Time   COLORURINE YELLOW 11/05/2023 0101   APPEARANCEUR CLOUDY (A) 11/05/2023 0101   LABSPEC 1.009 11/05/2023 0101   PHURINE 8.0 11/05/2023 0101  GLUCOSEU NEGATIVE 11/05/2023 0101   HGBUR SMALL (A) 11/05/2023 0101   BILIRUBINUR NEGATIVE 11/05/2023 0101   KETONESUR NEGATIVE 11/05/2023 0101   PROTEINUR 100 (A) 11/05/2023 0101   NITRITE NEGATIVE 11/05/2023 0101   LEUKOCYTESUR LARGE (A) 11/05/2023 0101     ROS:  Unable to obtain 2/2 patient intubated  Physical Exam: Vitals:   11/05/23 1245 11/05/23 1617  BP: (!) 96/56   Pulse: (!) 124   Resp: (!) 22   Temp:    SpO2: 95% 100%     General: elderly male in bed critically ill  HEENT: NCAT Neck: supple trachea midline  Heart: S1S2 tachy; no rub Lungs: coarse mechanical breath sounds  Abdomen: soft/nondistended midline incision - dressed  Extremities: trace edema lower extremities  Skin: no rash on extremities exposed  Neuro: sedated perioperatively  Ileal conduit in place with pouch - about 250 mL per RN  Assessment/Plan:  # AKI - secondary to ischemic and prerenal insults with small bowel obstruction  - would continue supportive measures for now  - agree with bicarb gtt  - If mental status worsens may need an alterative to cefepime   # Small bowel obstruction - Per surgery and critical care  # Obstructive uropathy - Ileal conduit in place - Per urology  # CKD stage IV - Noted creatinine 2.5 in 2023    # Metabolic acidosis - lactic acidosis and now s/p bowel resection  - bicarb gtt as above  Thank you for the consult.   Please do not hesitate to contact us with questions  Estanislado Emms 11/05/2023, 6:13 PM

## 2023-11-05 NOTE — Progress Notes (Signed)
 PHARMACY - PHYSICIAN COMMUNICATION CRITICAL VALUE ALERT - BLOOD CULTURE IDENTIFICATION (BCID)  Benjamin Hensley is an 83 y.o. male who presented to [facility] on 11/04/2023 with a chief complaint of abdominal pain, urostomy not draining well.  Assessment: 3 out of 4 BC growing GNR: Klebsiella Pneumoniae (last grown in 2023)  Name of physician (or Provider) Contacted: Vassie Loll  Current antibiotics: Cefepime  Changes to prescribed antibiotics recommended:  Increase Cefepime dose to 2g IV q24h  Results for orders placed or performed during the hospital encounter of 11/04/23  Blood Culture ID Panel (Reflexed) (Collected: 11/05/2023  1:15 AM)  Result Value Ref Range   Enterococcus faecalis NOT DETECTED NOT DETECTED   Enterococcus Faecium NOT DETECTED NOT DETECTED   Listeria monocytogenes NOT DETECTED NOT DETECTED   Staphylococcus species NOT DETECTED NOT DETECTED   Staphylococcus aureus (BCID) NOT DETECTED NOT DETECTED   Staphylococcus epidermidis NOT DETECTED NOT DETECTED   Staphylococcus lugdunensis NOT DETECTED NOT DETECTED   Streptococcus species NOT DETECTED NOT DETECTED   Streptococcus agalactiae NOT DETECTED NOT DETECTED   Streptococcus pneumoniae NOT DETECTED NOT DETECTED   Streptococcus pyogenes NOT DETECTED NOT DETECTED   A.calcoaceticus-baumannii NOT DETECTED NOT DETECTED   Bacteroides fragilis NOT DETECTED NOT DETECTED   Enterobacterales DETECTED (A) NOT DETECTED   Enterobacter cloacae complex NOT DETECTED NOT DETECTED   Escherichia coli NOT DETECTED NOT DETECTED   Klebsiella aerogenes NOT DETECTED NOT DETECTED   Klebsiella oxytoca NOT DETECTED NOT DETECTED   Klebsiella pneumoniae DETECTED (A) NOT DETECTED   Proteus species NOT DETECTED NOT DETECTED   Salmonella species NOT DETECTED NOT DETECTED   Serratia marcescens NOT DETECTED NOT DETECTED   Haemophilus influenzae NOT DETECTED NOT DETECTED   Neisseria meningitidis NOT DETECTED NOT DETECTED   Pseudomonas aeruginosa NOT  DETECTED NOT DETECTED   Stenotrophomonas maltophilia NOT DETECTED NOT DETECTED   Candida albicans NOT DETECTED NOT DETECTED   Candida auris NOT DETECTED NOT DETECTED   Candida glabrata NOT DETECTED NOT DETECTED   Candida krusei NOT DETECTED NOT DETECTED   Candida parapsilosis NOT DETECTED NOT DETECTED   Candida tropicalis NOT DETECTED NOT DETECTED   Cryptococcus neoformans/gattii NOT DETECTED NOT DETECTED   CTX-M ESBL NOT DETECTED NOT DETECTED   Carbapenem resistance IMP NOT DETECTED NOT DETECTED   Carbapenem resistance KPC NOT DETECTED NOT DETECTED   Carbapenem resistance NDM NOT DETECTED NOT DETECTED   Carbapenem resist OXA 48 LIKE NOT DETECTED NOT DETECTED   Carbapenem resistance VIM NOT DETECTED NOT DETECTED   Shamya Macfadden S. Merilynn Finland, PharmD, BCPS Clinical Staff Pharmacist Misty Stanley Stillinger 11/05/2023  2:19 PM

## 2023-11-05 NOTE — Transfer of Care (Signed)
 Immediate Anesthesia Transfer of Care Note  Patient: Benjamin Hensley  Procedure(s) Performed: EXPLORATORY LAPAROTOMY; lysis of adhesions; small bowel resection; ligation of left ureter; primary hernia repair  Patient Location: ICU  Anesthesia Type:General  Level of Consciousness: Patient remains intubated per anesthesia plan  Airway & Oxygen Therapy: Patient remains intubated per anesthesia plan  Post-op Assessment: Report given to RN  Post vital signs: Reviewed and stable  Last Vitals:  Vitals Value Taken Time  BP    Temp    Pulse 102 11/05/23 1622  Resp 19 11/05/23 1622  SpO2 100 % 11/05/23 1622  Vitals shown include unfiled device data.  Last Pain:  Vitals:   11/05/23 1109  TempSrc:   PainSc: Asleep         Complications: No notable events documented.

## 2023-11-06 ENCOUNTER — Encounter (HOSPITAL_COMMUNITY): Payer: Self-pay | Admitting: General Surgery

## 2023-11-06 ENCOUNTER — Inpatient Hospital Stay (HOSPITAL_COMMUNITY): Payer: Medicare Other

## 2023-11-06 DIAGNOSIS — J9601 Acute respiratory failure with hypoxia: Secondary | ICD-10-CM

## 2023-11-06 DIAGNOSIS — K56609 Unspecified intestinal obstruction, unspecified as to partial versus complete obstruction: Secondary | ICD-10-CM | POA: Diagnosis not present

## 2023-11-06 DIAGNOSIS — A419 Sepsis, unspecified organism: Secondary | ICD-10-CM

## 2023-11-06 DIAGNOSIS — N179 Acute kidney failure, unspecified: Secondary | ICD-10-CM | POA: Diagnosis not present

## 2023-11-06 LAB — GLUCOSE, CAPILLARY
Glucose-Capillary: 106 mg/dL — ABNORMAL HIGH (ref 70–99)
Glucose-Capillary: 115 mg/dL — ABNORMAL HIGH (ref 70–99)
Glucose-Capillary: 116 mg/dL — ABNORMAL HIGH (ref 70–99)
Glucose-Capillary: 150 mg/dL — ABNORMAL HIGH (ref 70–99)
Glucose-Capillary: 82 mg/dL (ref 70–99)
Glucose-Capillary: 88 mg/dL (ref 70–99)
Glucose-Capillary: 92 mg/dL (ref 70–99)

## 2023-11-06 LAB — COMPREHENSIVE METABOLIC PANEL
ALT: 16 U/L (ref 0–44)
AST: 27 U/L (ref 15–41)
Albumin: 2.6 g/dL — ABNORMAL LOW (ref 3.5–5.0)
Alkaline Phosphatase: 39 U/L (ref 38–126)
Anion gap: 11 (ref 5–15)
BUN: 59 mg/dL — ABNORMAL HIGH (ref 8–23)
CO2: 20 mmol/L — ABNORMAL LOW (ref 22–32)
Calcium: 7.2 mg/dL — ABNORMAL LOW (ref 8.9–10.3)
Chloride: 105 mmol/L (ref 98–111)
Creatinine, Ser: 3.66 mg/dL — ABNORMAL HIGH (ref 0.61–1.24)
GFR, Estimated: 16 mL/min — ABNORMAL LOW (ref >60)
Glucose, Bld: 156 mg/dL — ABNORMAL HIGH (ref 70–99)
Potassium: 4.2 mmol/L (ref 3.5–5.1)
Sodium: 136 mmol/L (ref 135–145)
Total Bilirubin: 0.8 mg/dL (ref 0.0–1.2)
Total Protein: 5.3 g/dL — ABNORMAL LOW (ref 6.5–8.1)

## 2023-11-06 LAB — TRIGLYCERIDES: Triglycerides: 493 mg/dL — ABNORMAL HIGH (ref <150)

## 2023-11-06 LAB — CBC
HCT: 31.9 % — ABNORMAL LOW (ref 39.0–52.0)
Hemoglobin: 10.4 g/dL — ABNORMAL LOW (ref 13.0–17.0)
MCH: 28.3 pg (ref 26.0–34.0)
MCHC: 32.6 g/dL (ref 30.0–36.0)
MCV: 86.9 fL (ref 80.0–100.0)
Platelets: 75 10*3/uL — ABNORMAL LOW (ref 150–400)
RBC: 3.67 MIL/uL — ABNORMAL LOW (ref 4.22–5.81)
RDW: 13.9 % (ref 11.5–15.5)
WBC: 29.8 10*3/uL — ABNORMAL HIGH (ref 4.0–10.5)
nRBC: 0 % (ref 0.0–0.2)

## 2023-11-06 LAB — PROTIME-INR
INR: 1.8 — ABNORMAL HIGH (ref 0.8–1.2)
Prothrombin Time: 21 s — ABNORMAL HIGH (ref 11.4–15.2)

## 2023-11-06 LAB — MAGNESIUM: Magnesium: 1.7 mg/dL (ref 1.7–2.4)

## 2023-11-06 LAB — PHOSPHORUS: Phosphorus: 4.1 mg/dL (ref 2.5–4.6)

## 2023-11-06 MED ORDER — LIDOCAINE 5 % EX PTCH
1.0000 | MEDICATED_PATCH | CUTANEOUS | Status: DC
  Administered 2023-11-06 – 2023-11-13 (×8): 1 via TRANSDERMAL
  Filled 2023-11-06 (×8): qty 1

## 2023-11-06 MED ORDER — CHLORHEXIDINE GLUCONATE CLOTH 2 % EX PADS
6.0000 | MEDICATED_PAD | Freq: Every day | CUTANEOUS | Status: DC
Start: 2023-11-06 — End: 2023-11-14
  Administered 2023-11-07 – 2023-11-11 (×6): 6 via TOPICAL

## 2023-11-06 MED ORDER — SODIUM CHLORIDE 0.9 % IV SOLN
2.0000 g | INTRAVENOUS | Status: AC
  Administered 2023-11-07 – 2023-11-14 (×8): 2 g via INTRAVENOUS
  Filled 2023-11-06 (×8): qty 20

## 2023-11-06 MED ORDER — ORAL CARE MOUTH RINSE
15.0000 mL | OROMUCOSAL | Status: DC
  Administered 2023-11-06 – 2023-11-07 (×4): 15 mL via OROMUCOSAL

## 2023-11-06 MED ORDER — VASOPRESSIN 20 UNITS/100 ML INFUSION FOR SHOCK: 0.0000 [IU]/min | INTRAVENOUS | Status: DC

## 2023-11-06 MED ORDER — ACETAMINOPHEN 10 MG/ML IV SOLN
1000.0000 mg | Freq: Four times a day (QID) | INTRAVENOUS | Status: AC
  Administered 2023-11-06 – 2023-11-07 (×4): 1000 mg via INTRAVENOUS
  Filled 2023-11-06 (×4): qty 100

## 2023-11-06 MED ORDER — SODIUM CHLORIDE 0.9 % IV SOLN: INTRAVENOUS | Status: DC

## 2023-11-06 NOTE — Plan of Care (Signed)
  Problem: Skin Integrity: Goal: Risk for impaired skin integrity will decrease Outcome: Progressing   Problem: Pain Managment: Goal: General experience of comfort will improve and/or be controlled Outcome: Progressing

## 2023-11-06 NOTE — Procedures (Signed)
 Extubation Procedure Note  Patient Details:   Name: Benjamin Hensley DOB: [birth year] MRN: [id number]   Airway Documentation:    Vent end date: 11/06/23 Vent end time: 1315   Evaluation  O2 sats: stable throughout Complications: No apparent complications Patient did tolerate procedure well. Bilateral Breath Sounds: Clear, Diminished   Yes  Dairl Ponder Nannette 11/06/2023, 1:17 PM  Positive cuff leak pre extubation. Placed on 2 lpm nasal cannula post extubation (goal >=92%), currently 96%. RN aware.

## 2023-11-06 NOTE — Progress Notes (Signed)
 eLink Physician-Brief Progress Note Patient Name: Benjamin Hensley DOB: Jan 10, 1941 MRN: [id number]   Date of Service  11/06/2023  HPI/Events of Note  Received query from bedside RN regarding risk of hypotension when the patient is warmed with bair hugger.   Pt has a rectal temp of 95.42F.  He is in septic shock on levophed at 67mcg/min and HCO3 gtt.   eICU Interventions  Ok to apply Lawyer.  Continue to titrate levophed.  Pt may need vasopressin if pressor requirements rise.     Intervention Category Intermediate Interventions: Other:  Benjamin Hensley 11/06/2023, 2:17 AM

## 2023-11-06 NOTE — TOC Initial Note (Signed)
 Transition of Care [facility]) - Initial/Assessment Note    Patient Details  Name: Benjamin Hensley MRN: [id number] Date of Birth: 1941/05/18  Transition of Care [facility]) CM/SW Contact:    Diona Browner, LCSW Phone Number: 11/06/2023, 9:34 AM  Clinical Narrative:                 Pt from home ILF w/ spouse. Pt currently unresponsive. TOC to follow for d/c needs.     Barriers to Discharge: Continued Medical Work up   Patient Goals and CMS Choice Patient states their goals for this hospitalization and ongoing recovery are:: return home          Expected Discharge Plan and Services       Living arrangements for the past 2 months: Single Family Home                                      Prior Living Arrangements/Services Living arrangements for the past 2 months: Single Family Home Lives with:: Spouse Patient language and need for interpreter reviewed:: Yes Do you feel safe going back to the place where you live?: Yes      Need for Family Participation in Patient Care: Yes (Comment) Care giver support system in place?: Yes (comment)   Criminal Activity/Legal Involvement Pertinent to Current Situation/Hospitalization: No - Comment as needed  Activities of Daily Living   ADL Screening (condition at time of admission) Independently performs ADLs?: No (pt on vent, sedated) Does the patient have a NEW difficulty with bathing/dressing/toileting/self-feeding that is expected to last >3 days?: No Does the patient have a NEW difficulty with getting in/out of bed, walking, or climbing stairs that is expected to last >3 days?: No Does the patient have a NEW difficulty with communication that is expected to last >3 days?: No Is the patient deaf or have difficulty hearing?: No Does the patient have difficulty seeing, even when wearing glasses/contacts?: No Does the patient have difficulty concentrating, remembering, or making decisions?: No  Permission Sought/Granted                   Emotional Assessment Appearance:: Appears stated age Attitude/Demeanor/Rapport: Unable to Assess Affect (typically observed): Unable to Assess Orientation: :  (unresponsive) Alcohol / Substance Use: Not Applicable Psych Involvement: No (comment)  Admission diagnosis:  Small bowel obstruction (HCC) [K56.609] SBO (small bowel obstruction) (HCC) [K56.609] Generalized abdominal pain [R10.84] AKI (acute kidney injury) (HCC) [N17.9] Sepsis (HCC) [A41.9] Patient Active Problem List   Diagnosis Date Noted   Sepsis (HCC) 11/05/2023   SBO (small bowel obstruction) (HCC) 11/05/2023   Shock (HCC) 11/05/2023   Small bowel obstruction (HCC) 11/05/2023   Generalized abdominal pain 11/04/2023   Hydronephrosis 04/02/2022   Kidney stone 03/30/2022   Anemia of chronic disease 11/27/2021   Leukocytosis 11/27/2021   SIRS (systemic inflammatory response syndrome) (HCC) 11/27/2021   Chronic kidney disease, stage 3a (HCC) 11/26/2021   Benign prostatic hyperplasia 11/26/2021   Generalized anxiety disorder 11/26/2021   Intermittent asthma 11/26/2021   UTI (urinary tract infection) 11/26/2021   HLD (hyperlipidemia) 11/26/2021   Fall at home, initial encounter 11/26/2021   Generalized weakness 11/26/2021   AKI (acute kidney injury) (HCC) 11/26/2021   Complicated UTI (urinary tract infection) 11/14/2021   Bladder cancer (HCC) 09/22/2021   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 [place] [address] Phone: [phone] Fax: [phone]     Social Drivers of Health (SDOH) Social History: SDOH Screenings   Food Insecurity: Patient Unable To Answer (11/06/2023)  Transportation  Needs: Patient Unable To Answer (11/06/2023)  Utilities: Patient Unable To Answer (11/06/2023)  Social Connections: Patient Unable To Answer (11/06/2023)  Tobacco Use: Medium Risk (11/04/2023)   SDOH Interventions:     Readmission Risk Interventions    11/06/2023    9:31 AM  Readmission Risk Prevention Plan  Transportation Screening Complete  PCP or Specialist Appt within 3-5 Days Complete  HRI or Home Care Consult Complete  Social Work Consult for Recovery Care Planning/Counseling Complete  Palliative Care Screening Not Applicable  Medication Review Oceanographer) Complete

## 2023-11-06 NOTE — Progress Notes (Signed)
 Initial Nutrition Assessment  DOCUMENTATION CODES:   Not applicable  INTERVENTION:  - NPO.  - Per CCS, plan to await return of bowel function.  - If unable to advance diet in the next 2-3 days, would recommend TPN.  NUTRITION DIAGNOSIS:   Inadequate oral intake related to inability to eat as evidenced by NPO status.  GOAL:   Patient will meet greater than or equal to 90% of their needs  MONITOR:   Diet advancement, Weight trends  REASON FOR ASSESSMENT:   Ventilator    ASSESSMENT:   83 y.o. male with PMH significant for HLD, SBO (s/p ex-lap/SBR 02/2022), aggressive bladder/prostate CA requiring ileal conduit, CKD stage IV, dementia who presented for sudden onset of abdominal pain around 1200 with nausea, vomiting and chills. Admitted with septic shock and small bowel obstruction.   2/24 Admit 2/25 s/ ex-lap, found to have closed-loop SBO with necrosis s/p SBR; remained intubated post-op  Patient intubated on ventilator at time of visit. No family at bedside to obtain history.  Per EMR, only weight history over the past year is from March 2024 when patient was weighed at 179#. All weights this admission over 200# indicating possible weight gain.   Patient is s/p SBR for SBO yesterday with CCS. Per CCS progress note today, plan to continue NGT to suction and await return of bowel function.   Patient later extubated after visit. Will obtain history at next visit. No nutrition plans at this time.  If unable to advance diet in the next 2-3 days, would recommend TPN.   Medications reviewed and include: Colace, Miralax  Labs reviewed:  Creatinine 3.66   NUTRITION - FOCUSED PHYSICAL EXAM:  Flowsheet Row Most Recent Value  Orbital Region Mild depletion  Upper Arm Region No depletion  Thoracic and Lumbar Region No depletion  Buccal Region Unable to assess  Temple Region Mild depletion  Clavicle Bone Region No depletion  Clavicle and Acromion Bone Region No depletion   Scapular Bone Region Unable to assess  Dorsal Hand No depletion  Patellar Region No depletion  Anterior Thigh Region No depletion  Posterior Calf Region No depletion  Edema (RD Assessment) None  Hair Reviewed  Eyes Unable to assess  Mouth Unable to assess  Skin Reviewed  Nails Reviewed       Diet Order:   Diet Order             Diet NPO time specified  Diet effective now                   EDUCATION NEEDS:  Not appropriate for education at this time  Skin:  Skin Assessment: Skin Integrity Issues: Skin Integrity Issues:: Incisions Incisions: Abdomen  Last BM:  unknown  Height:  Ht Readings from Last 1 Encounters:  11/05/23 5\' 11"  (1.803 m)   Weight:  Wt Readings from Last 1 Encounters:  11/06/23 95.3 kg    BMI:  Body mass index is 29.3 kg/m.  Estimated Nutritional Needs:  Kcal:  2000-2300 kcals Protein:  100-120 grams Fluid:  >/= 2L    Shelle Iron RD, LDN Contact via Secure Chat.

## 2023-11-06 NOTE — Progress Notes (Addendum)
 [facility] Progress Note  1 Day Post-Op  Subjective: CC:  Intubated/sedated HR 80's, systolic pressure 90's Decreasing pressor requirements - on 15 mcg levo this AM. Creatinine 3.66 from 4.8 yesterday WBC 29, hgb 10  Objective: Vital signs in last 24 hours: Temp:  [95.8 F (35.4 C)-101.6 F (38.7 C)] 97.1 F (36.2 C) (02/26 0645) Pulse Rate:  [70-124] 78 (02/26 0700) Resp:  [0-41] 16 (02/26 0700) BP: (93-122)/(40-65) 117/52 (02/26 0700) SpO2:  [94 %-100 %] 97 % (02/26 0700) Arterial Line BP: (75-135)/(46-64) 112/62 (02/26 0700) FiO2 (%):  [30 %-100 %] 30 % (02/26 0335) Weight:  [92.6 kg] 92.6 kg (02/25 1758)    Intake/Output from previous day: 02/25 0701 - 02/26 0700 In: 7490.6 [I.V.:6947.7; IV Piggyback:542.9] Out: 2140 [Urine:1225; Emesis/NG output:650; Drains:245; Blood:20] Intake/Output this shift: No intake/output data recorded.  PE: Gen:  intubated/sedated  Card:  Regular rate and rhythm, no lower extremity edema, Pulm:  ventilated respirations - 30%/5 PEEP Abd: Soft, overall non-distended, midline wound c/d/I - fascia in tact, urostomy RUQ productive of clear, yellow urine (<1200 mL). JP drain in pelvis is SS.  Skin: warm and dry, no rashes, LUE with ecchymosis - looks like his IV blew Psych: A&Ox3   Lab Results:  Recent Labs    11/05/23 0537 11/05/23 1420 11/05/23 1501 11/06/23 0314  WBC 6.9  --   --  29.8*  HGB 13.8   < > 9.2* 10.4*  HCT 42.1   < > 27.0* 31.9*  PLT 95*  --   --  75*   < > = values in this interval not displayed.   BMET Recent Labs    11/05/23 1628 11/06/23 0314  NA 139 136  K 4.8 4.2  CL 114* 105  CO2 15* 20*  GLUCOSE 112* 156*  BUN 61* 59*  CREATININE 4.85* 3.66*  CALCIUM 7.7* 7.2*   PT/INR Recent Labs    11/05/23 1628 11/06/23 0314  LABPROT 19.7* 21.0*  INR 1.6* 1.8*   CMP     Component Value Date/Time   NA 136 11/06/2023 0314   K 4.2 11/06/2023 0314   CL 105 11/06/2023 0314   CO2 20 (L)  11/06/2023 0314   GLUCOSE 156 (H) 11/06/2023 0314   BUN 59 (H) 11/06/2023 0314   CREATININE 3.66 (H) 11/06/2023 0314   CALCIUM 7.2 (L) 11/06/2023 0314   PROT 5.3 (L) 11/06/2023 0314   ALBUMIN 2.6 (L) 11/06/2023 0314   AST 27 11/06/2023 0314   ALT 16 11/06/2023 0314   ALKPHOS 39 11/06/2023 0314   BILITOT 0.8 11/06/2023 0314   GFRNONAA 16 (L) 11/06/2023 0314   Lipase     Component Value Date/Time   LIPASE 38 11/14/2021 1355       Studies/Results: DG Chest Port 1 View Result Date: 11/05/2023 CLINICAL DATA:  Line placement EXAM: PORTABLE CHEST 1 VIEW COMPARISON:  11/05/2023 earlier FINDINGS: Stable ET tube and enteric tube. Left IJ catheter in place with tip along the central SVC above the right atrium. No pneumothorax. Stable enlarged cardiopericardial silhouette with some prominence of central vasculature. Persistent right lung base opacity with small right effusion. Dense right upper lung nodule consistent with old granulomatous disease. IMPRESSION: New left IJ catheter.  No pneumothorax. Electronically Signed   By: Karen Kays M.D.   On: 11/05/2023 17:58   Portable Chest x-ray Result Date: 11/05/2023 CLINICAL DATA:  Intubation. EXAM: PORTABLE CHEST 1 VIEW COMPARISON:  Radiographs 11/04/2023 and 08/13/2023. FINDINGS: 1619 hours. Interval intubation. Tip  of the endotracheal tube overlies the mid trachea. Enteric tube projects below the diaphragm, tip not visualized. The heart size and mediastinal contours are stable allowing for supine technique. Mildly increased opacities at both lung bases, greater on the right. No evidence of pneumothorax or significant pleural effusion. Unchanged calcified right upper lobe granuloma. No acute osseous findings. IMPRESSION: 1. Satisfactory position of support system. 2. Mildly increased bibasilar opacities, greater on the right, which could reflect atelectasis or pneumonia. Electronically Signed   By: Carey Bullocks M.D.   On: 11/05/2023 17:09   DG Abd  Portable 1V-Small Bowel Obstruction Protocol-initial, 8 hr delay Result Date: 11/05/2023 CLINICAL DATA:  Small-bowel obstruction.  8 hour delayed film EXAM: PORTABLE ABDOMEN - 1 VIEW COMPARISON:  11/05/2023 at 1 a.m. FINDINGS: Abdominal x-ray obtained 8 hours after administration of small amount of oral contrast demonstrates a small amount of contrast in the fundus of the stomach. Enteric tube extending to the right of the spine, likely along the distal stomach. There is a right lower quadrant ostomy. The large bowel has scattered stool and is nondilated. No colonic contrast. There are several distended loops of small bowel in the mid abdomen with central loop measuring up to 3.3 cm in diameter. No contrast in the small bowel. Curvature and moderate degenerative changes of the spine. IMPRESSION: Small amount of contrast overlying the fundus of the stomach. No significant other areas of small large bowel contrast. Persistent dilated loops of small bowel in the mid abdomen. Right-sided ostomy.  Diffuse scattered colonic stool. NG tube Electronically Signed   By: Karen Kays M.D.   On: 11/05/2023 13:36   DG Abd Portable 1V-Small Bowel Protocol-Position Verification Result Date: 11/05/2023 CLINICAL DATA:  NG tube placement EXAM: PORTABLE ABDOMEN - 1 VIEW COMPARISON:  CT today. FINDINGS: NG tube tip is in the stomach with the side port near the GE junction. IMPRESSION: NG tube in the proximal stomach. Electronically Signed   By: Charlett Nose M.D.   On: 11/05/2023 01:37   CT ABDOMEN PELVIS WO CONTRAST Result Date: 11/04/2023 CLINICAL DATA:  Acute nonlocalized abdominal pain. Worsening right upper quadrant abdominal pain tonight. Urostomy not draining. EXAM: CT ABDOMEN AND PELVIS WITHOUT CONTRAST TECHNIQUE: Multidetector CT imaging of the abdomen and pelvis was performed following the standard protocol without IV contrast. RADIATION DOSE REDUCTION: This exam was performed according to the departmental  dose-optimization program which includes automated exposure control, adjustment of the mA and/or kV according to patient size and/or use of iterative reconstruction technique. COMPARISON:  08/15/2023 FINDINGS: Lower chest: Mild atelectasis in the lung bases. Small esophageal hiatal hernia. Hepatobiliary: Minimal free fluid along the anterior liver edge. No focal liver lesions. Gallbladder and bile ducts are unremarkable. Pancreas: Unremarkable. No pancreatic ductal dilatation or surrounding inflammatory changes. Spleen: Calcified granulomas in the spleen.  Normal-size. Adrenals/Urinary Tract: No adrenal gland nodules. Diffuse parenchymal atrophy involving the left kidney. Bilateral hydronephrosis and hydroureter. A right lower quadrant urinary conduit is present. The urinary conduit is decompressed. Bladder is surgically absent. Stomach/Bowel: Stomach is fluid-filled without abnormal distention. Proximal small bowel are decompressed. Mid abdominal and right lower quadrant small bowel loops are dilated and fluid-filled with thickened wall. There appears to be tethering in the right lower quadrant likely indicating an area of adhesions. This is near the apex of the ileal conduit. Scattered stool throughout the colon. There is a ventral abdominal wall hernia at the umbilicus containing fat, cecum, and terminal ileum. There is no obvious obstruction at this  level. Multiple areas of surgical anastomosis are demonstrated in small bowel. Vascular/Lymphatic: Aortic atherosclerosis. No enlarged abdominal or pelvic lymph nodes. Reproductive: Prostate gland appears to be surgically absent. Other: Small amount of free fluid in the mesentery surrounding dilated bowel loops. No free air. Musculoskeletal: Degenerative changes in the spine. No acute bony abnormalities. IMPRESSION: 1. Right lower quadrant ileal conduit. Ileal conduit is decompressed and there is bilateral hydronephrosis and hydroureter. Left renal atrophy. 2.  Dilated mid to lower abdominal small bowel with small bowel wall thickening in this area and mesenteric fluid. No pneumatosis. 3. Changes are likely to represent small bowel obstruction with possible vascular compromise causing small-bowel wall thickening. Level of obstruction appears to be in the right lower quadrant, likely adhesions. Obstruction may also be involving the proximal ileal conduit. 4. Aortic atherosclerosis. These results were called by telephone at the time of interpretation on 11/04/2023 at 9:41 pm to provider [facility] , who verbally acknowledged these results. Electronically Signed   By: Burman Nieves M.D.   On: 11/04/2023 21:45   DG Chest Portable 1 View Result Date: 11/04/2023 CLINICAL DATA:  Worsening right upper quadrant pain EXAM: PORTABLE CHEST 1 VIEW COMPARISON:  08/13/2023 FINDINGS: Single frontal view of the chest demonstrates an unremarkable cardiac silhouette. No airspace disease, effusion, or pneumothorax. No acute bony abnormalities. IMPRESSION: 1. No acute intrathoracic process. Electronically Signed   By: Sharlet Salina M.D.   On: 11/04/2023 21:10    Anti-infectives: Anti-infectives (From admission, onward)    Start     Dose/Rate Route Frequency Ordered Stop   11/06/23 0400  ceFEPIme (MAXIPIME) 1 g in sodium chloride 0.9 % 100 mL IVPB  Status:  Discontinued        1 g 200 mL/hr over 30 Minutes Intravenous Every 24 hours 11/05/23 0456 11/05/23 1419   11/06/23 0400  ceFEPIme (MAXIPIME) 2 g in sodium chloride 0.9 % 100 mL IVPB        2 g 200 mL/hr over 30 Minutes Intravenous Every 24 hours 11/05/23 1419     11/05/23 1200  metroNIDAZOLE (FLAGYL) IVPB 500 mg        500 mg 100 mL/hr over 60 Minutes Intravenous Every 12 hours 11/05/23 1106     11/05/23 0345  ceFEPIme (MAXIPIME) 2 g in sodium chloride 0.9 % 100 mL IVPB        2 g 200 mL/hr over 30 Minutes Intravenous  Once 11/05/23 0331 11/05/23 0424        Assessment/Plan SBO 83 y/o M w/ history of  bladder cancer, cystoprostatectomy and ileal conduit, ex lap for SBO in 2023 who presented with acute abdominal pain and vomiting. CT significant for SBO in RLQ. He was in septic shock in the ED and was taken emergently to the operating room where a closed loop SBO was found.  S/P EXPLORATORY LAPAROTOMY; lysis of adhesions; small bowel resection; ligation of left ureter; primary hernia repair 2/25 Dr. Carolynne Edouard (with Dr. Alvester Morin, Dr. Berneice Heinrich)  -  POD#1, afebrile, WBC 29, hgb stable - continue NG to LIWS and await bowel function - twice daily moist-to-dry dressing changes to midline abdominal wound. Consider VAC placement Friday.  - continue JP drain, it is sitting in the pelves where the left ureter was ligated. If drain output increases consider urine leak and check drain creatinine  - appreciate CCM mgmt of septic shock. Hemodynamics and renal function improved this morning   FEN: NPO, IVF per primary, NG to LIWS ID: cefepime, flagyl Foley:  none - urostomy productive VTE: daily lovenox Dispo: ICU       LOS: 2 days   I reviewed nursing notes, hospitalist notes, last 24 h vitals and pain scores, last 48 h intake and output, last 24 h labs and trends, and last 24 h imaging results.  This care required moderate level of medical decision making.   Hosie Spangle, PA-C [facility] Please see Amion for pager number during day hours 7:00am-4:30pm

## 2023-11-06 NOTE — Progress Notes (Signed)
 PT Cancellation Note  Patient Details Name: Benjamin Hensley MRN: [id number] DOB: 1940/12/28   Cancelled Treatment:    Reason Eval/Treat Not Completed: Medical issues which prohibited therapy;   Pt remains intubated, sedated, remains on pressors; will continue to follow and complete PT eval as able  Delice Bison, PT  Acute Rehab Dept [facility]) [phone]  11/06/2023    [facility] 11/06/2023, 9:24 AM

## 2023-11-06 NOTE — Progress Notes (Signed)
 NAME:  Benjamin Hensley, MRN:  [id number], DOB:  1940-09-20, LOS: 2 ADMISSION DATE:  11/04/2023 CONSULTATION DATE:  11/05/2023 REFERRING MD:  Jonathon Bellows - [facility] CHIEF COMPLAINT:  Abdominal pain, concern for developing urosepsis   History of Present Illness:  83 year old man who presented to [facility] ED 2/24 for abdominal pain. PMHx significant for HLD, SBO (s/p ex-lap/SBR 02/2022), aggressive bladder/prostate CA requiring ileal conduit (followed by Urology - Dr. 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  Patient initially presented to [facility] 2/24 for sudden onset of abdominal pain around 1200 with nausea, vomiting and chills. EMS was called and brought patient to ED. While in ED, patient was noted to be febrile to Tmax 103.65F, tachycardic to 100s, tachypneic to 20s-low 30s, BP 150s/80s. Labs were notable for WBC 8.3, Hgb 14.1, Plt 146. Na 131, K 4.3, CO2 21, Cr 2.71 (baseline variable), LFTs WNL. Trop unremarkable. UA with small Hgb, 100 protein, large leuks, rare bacteria. Blood Cx and Urine Cx pending. Empiric broad-spectrum antibiotics (cefepime/Flagyl) started.   On 2/25AM while awaiting bed placement, patient was noted to be persistently febrile to 101.45F with stable WBC but rising LA to 7.1 and Cr nearly doubled 4.45 (2.71). Hemodynamically, patient became borderline hypotensive.  PCCM consulted for possible ICU management.  Pertinent Medical History:   Past Medical History:  Diagnosis Date   Bladder cancer (HCC)    Chronic kidney disease    Dementia (HCC)    History of kidney stones    Medical history non-contributory    Meningitis    hx of as a child affected right eye   Significant Hospital Events: Including procedures, antibiotic start and stop dates in addition to other pertinent events   2/24 - Presented to [facility] with abdominal pain, n/v. Extensive urologic history. Febrile, WBC  WNL, normotensive with plan for floor admission. 2/25 - Persistently febrile with soft BP, rising LA to 7.1. PCCM consulted. Taken to OR for ex-lap; found to have closed-loop SBO with necrosis (resected) 2/2 tight band (nonfunctional L ureter) which was ligated. Taken to ICU postoperatively on 2 vasopressors and intubated. LIJ CVC placed for access.  Interim History / Subjective:  POD#1 from ex-lap, SBR, L ureter ligation ([facility]S/Urology) Decreasing vasopressor requirements overnight, down to (peak ) Remains intubated, sedated; stopping sedation for SBT  Objective:  Blood pressure (!) 117/52, pulse 78, temperature 97.7 F (36.5 C), temperature source Axillary, resp. rate 16, height 5\' 11"  (1.803 m), weight 95.3 kg, SpO2 97%.    Vent Mode: PRVC FiO2 (%):  [30 %-100 %] 30 % Set Rate:  [18 bmp] 18 bmp Vt Set:  [600 mL] 600 mL PEEP:  [5 cmH20] 5 cmH20 Pressure Support:  [5 cmH20] 5 cmH20 Plateau Pressure:  [15 cmH20-20 cmH20] 20 cmH20   Intake/Output Summary (Last 24 hours) at 11/06/2023 0854 Last data filed at 11/06/2023 0272 Gross per 24 hour  Intake 7785.42 ml  Output 2140 ml  Net 5645.42 ml   Filed Weights   11/05/23 0422 11/05/23 1758 11/06/23 0704  Weight: 98.5 kg 92.6 kg 95.3 kg   Physical Examination: General: Acutely ill-appearing older man in NAD. HEENT: [place]/AT, anicteric sclera, PERRL, moist mucous membranes. NGT in place. Neuro:  Lightly sedated, slowly waking up.  Opens eyes to voice/responds to verbal stimuli. Not following commands. No spontaneous movement of extremities noted on my exam. +Cough and +Gag  CV: RRR, no m/g/r. PULM: Breathing even and unlabored  on vent (PSV 8/5, FiO2 30%). Lung fields CTAB in upper fields, diminished at bases. GI: Soft, mildly distended, appropriately TTP postoperatively. Midline wound with fascia closed, skin open; WTD dressings in place. Hypoactive bowel sounds. NGT with dark brown/bilious output. Extremities: No LE edema  noted. Skin: Warm/dry, no rashes.  Resolved Hospital Problem List:    Assessment & Plan:  Undifferentiated shock, presumed septic versus hypovolemic in the setting of GI losses with SBO - Goal MAP > 65 - Fluid resuscitation as tolerated - Levophed titrated to goal MAP, requirements decreasing - Trend WBC, fever curve, LA - F/u Cx data (OR Cx sent) - Continue broad-spectrum antibiotics (cefepime/Flagyl)  SBO, s/p ex-lap/LOA and SBR 2/25 Nausea with vomiting History of SBO (s/p ex-lap/SBR 02/2022). - POD#1 from ex-lap/SBR - Postoperative management per CCS - Fascia closed, midline wound with WTD dressings at present, possible WV placement 2/28 - JP in place - NGT to LIWS, awaiting ROBF  Acute hypoxemic respiratory failure in the setting of sepsis - Continue full vent support (4-8cc/kg IBW) - Wean FiO2 for O2 sat > 90% - Daily WUA/SBT as clinically appropriate; weaning at present 2/26AM - VAP bundle - Pulmonary hygiene - PAD protocol for sedation: Propofol and Fentanyl for goal RASS 0 to -1  AKI on CKD stage IV Recurrent pyelonephritis/bacteruria post-cystectomy Chronic hydronephrotic L kidney, s/p L ureter ligation 2/25 Low grade R ureteral stricture Aggressive bladder/prostate CA requiring ileal conduit (followed by Urology - Dr. Berneice Heinrich) - Urology following, appreciate recommendations - S/p ligation of nonfunctional L ureter 2/25 - Renal indices improved today, improved UOP from urostomy - Trend BMP - Replete electrolytes as indicated - Monitor I&Os - Avoid nephrotoxic agents as able - Ensure adequate renal perfusion  HLD - Resume statin  Dementia - Resume Zoloft  Best Practice: (right click and "Reselect all SmartList Selections" daily)   Diet/type: NPO DVT prophylaxis: SCDs GI prophylaxis: PPI Lines: Central line and Arterial Line Foley:  N/A - Urostomy in place with ileal conduit Code Status:  full code Last date of multidisciplinary goals of care  discussion [2/25 - Full Code confirmed]  Critical care time:   The patient is critically ill with multiple organ system failure and requires high complexity decision making for assessment and support, frequent evaluation and titration of therapies, advanced monitoring, review of radiographic studies and interpretation of complex data.   Critical Care Time devoted to patient care services, exclusive of separately billable procedures, described in this note is 39 minutes.  Tim Lair, PA-C [facility] 11/06/23 8:54 AM  Please see [email or web address] for pager details.  From 7A-7P if no response, please call [phone] After hours, please call ELink [phone]

## 2023-11-06 NOTE — Progress Notes (Signed)
 Arterial line at this time has good waveform, draws and flushes blood, insertion WNL.

## 2023-11-06 NOTE — Progress Notes (Signed)
 [facility] Progress Note  Name: Benjamin Hensley MRN: [id number] DOB: [birth year]  Chief Complaint:  N/v/fever   Subjective:  He had 1.2 liters UOP over 2/25.  He has had another 850 mL UOP thus far today.  He has been on bicarb gtt - this was reduced to 75 ml/hr.  He was just extubated this afternoon and is on 2 liters.  Spoke with RN at bedside.  Review of systems:  Unable to obtain given patient factors - just extubated a little while ago - does not speak on my exam   --------------- Background on consult:  Benjamin Hensley is a 83 y.o. male with a history of CKD stage IV, obstructive uropathy and bladder/prostate cancer presented to the hospital with nausea and vomiting as well as fever.  He has a bladder conduit in place for known bilateral hydronephrosis.  He was found to have a small bowel obstruction.  Note that the bowel obstruction may also involve the ileal conduit per imaging.  Surgery and urology were consulted.  Nephrology is consulted for assistance with management of AKI. Earlier today he underwent ex-lap with ligation of left distal ureter and repair of ileal conduit.  Also noted patient with small bowel resection with surgery today in combined case.  He has been ordered for a bicarb gtt - pharmacy is sending this up.  He has been transferred to the ICU.  He is intubated and on levophed at 10 mcg/min and phenylephrine at 70 mcg/min.  Transitioning to levo per RN report.  He follows with Dr. Berneice Heinrich in urology.  I spoke with his wife via phone.  He does not have a nephrologist.  Per his wife the patient has no history of dementia though this is charted.           Intake/Output Summary (Last 24 hours) at 11/06/2023 1627 Last data filed at 11/06/2023 1437 Gross per 24 hour  Intake 3850.11 ml  Output 3115 ml  Net 735.11 ml    Vitals:  Vitals:   11/06/23 1200 11/06/23 1300 11/06/23 1400 11/06/23 1500  BP: (!) 145/62 (!) 125/51 (!) 111/53 (!) 106/59  Pulse: (!)  114 (!) 112 98 96  Resp: (!) 24 (!) 22 20 18   Temp:      TempSrc:      SpO2: 97% 96% 95% 97%  Weight:      Height:         Physical Exam:  General elderly male in bed in no acute distress HEENT normocephalic atraumatic extraocular movements intact sclera anicteric Neck supple trachea midline Lungs clear to auscultation bilaterally anteriorly normal work of breathing at rest on 2 liters Heart S1S2 tachycardic; no rub Abdomen soft midline incision dressed; urostomy in place Extremities no pitting edema  Neuro - still waking up after extubation; does not answer questions for me    Medications reviewed   Labs:     Latest Ref Rng & Units 11/06/2023    3:14 AM 11/05/2023    4:28 PM 11/05/2023    3:01 PM  BMP  Glucose 70 - 99 mg/dL 409  811    BUN 8 - 23 mg/dL 59  61    Creatinine 9.14 - 1.24 mg/dL 7.82  9.56    Sodium 213 - 145 mmol/L 136  139  141   Potassium 3.5 - 5.1 mmol/L 4.2  4.8  4.1   Chloride 98 - 111 mmol/L 105  114    CO2 22 - 32 mmol/L  20  15    Calcium 8.9 - 10.3 mg/dL 7.2  7.7       Assessment/Plan:   # AKI - secondary to ischemic and prerenal insults with small bowel obstruction  - improving with supportive measures - transition to normal saline    # Small bowel obstruction - s/p emergent ex-lap on 2/25 - Per surgery and critical care   # Obstructive uropathy - Ileal conduit in place - Per urology   # Normocytic anemia - setting of emergent surgery and hydration - no acute indication for PRBC's  # CKD stage IV - Noted creatinine 2.5 in 2023     # Metabolic acidosis - lactic acidosis and now s/p bowel resection  - can stop bicarb gtt   Disposition - in the ICU  Estanislado Emms, MD 11/06/2023 4:39 PM

## 2023-11-06 NOTE — Progress Notes (Signed)
 1 Day Post-Op   Subjective/Chief Complaint:   1- Bladder + Prostate Cancer - s/p cystoprostatectomy/ conduit diversion 09/2021 for pT2N0 bladder and pT2N0 prostate cancer with negative margins.   2 - Renal Insufficiency / Atrophic Left Kidney - severe left renal atrophy on imaging x many with Cr baseline 3's. 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  3 - Chronic, Non-Obstructive Right Hydronephrosis - some chronic Rt hydro that waxes / wanes. Worse on CT 10/2023 as some mass effect form dilated bowel (now treated). Previous diagnostic ureteroscopy 2024 w/o high grade obstruction.  4 - Sepsis / Small Bowel Obstruction - frank sepsis during admission 10/2023. Ex-lap / bowel resection with several feet of dead bowel 2023-11-10. BCX Klebsiella / pending placed on cefepime.  Today "Benjamin Hensley" is stable but remains critically ill. GFR slightly improved, WBC nearly 30k. Pressors and vent weanign. UOP increasing and JP acceptable    Objective: Vital signs in last 24 hours: Temp:  [95.8 F (35.4 C)-98.5 F (36.9 C)] 97.7 F (36.5 C) (02/26 0755) Pulse Rate:  [70-124] 82 (02/26 0800) Resp:  [0-41] 14 (02/26 0800) BP: (93-122)/(40-65) 122/54 (02/26 0800) SpO2:  [94 %-100 %] 97 % (02/26 0800) Arterial Line BP: (75-135)/(46-64) 123/53 (02/26 0800) FiO2 (%):  [30 %-100 %] 30 % (02/26 0335) Weight:  [92.6 kg-95.3 kg] 95.3 kg (02/26 0704)    Intake/Output from previous day: 11-10-2023 0701 - 02/26 0700 In: 7490.6 [I.V.:6947.7; IV Piggyback:542.9] Out: 2140 [Urine:1225; Emesis/NG output:650; Drains:245; Blood:20] Intake/Output this shift: Total I/O In: 294.8 [I.V.:294.8] Out: -   EXAM: Sedated on vernt GCS 3t Non-coarse breath sounds on vent. NGT in place HR low 100s regular on monitor, SBP 140s Midline surgical dressig c/d/I. RLQ Urostomy wih copious  non-foul urine. LLQ JP with minimal non-foul serosanguinous output NO c/c/e  Lab Results:  Recent Labs    2023-11-10 0537 11/10/2023 1420 2023/11/10 1501 11/06/23 0314  WBC 6.9  --   --  29.8*  HGB 13.8   < > 9.2* 10.4*  HCT 42.1   < > 27.0* 31.9*  PLT 95*  --   --  75*   < > = values in this interval not displayed.   BMET Recent Labs    Nov 10, 2023 1628 11/06/23 0314  NA 139 136  K 4.8 4.2  CL 114* 105  CO2 15* 20*  GLUCOSE 112* 156*  BUN 61* 59*  CREATININE 4.85* 3.66*  CALCIUM 7.7* 7.2*   PT/INR Recent Labs    2023-11-10 1628 11/06/23 0314  LABPROT 19.7* 21.0*  INR 1.6* 1.8*   ABG Recent Labs    10-Nov-2023 1501 10-Nov-2023 1839  PHART 7.172* 7.3*  HCO3 17.3* 15.7*    Studies/Results: Portable Chest xray Result Date: 11/06/2023 CLINICAL DATA:  Endotracheal tube placement. EXAM: PORTABLE CHEST 1 VIEW COMPARISON:  Nov 10, 2023. FINDINGS: The heart size and mediastinal contours are within normal limits. Endotracheal and nasogastric tubes are unchanged in position. Left internal jugular catheter is unchanged. Increased bibasilar opacities are noted concerning for worsening atelectasis or infiltrates. The visualized skeletal structures are unremarkable. IMPRESSION: Stable support apparatus. Increased bibasilar opacities as noted above. Electronically Signed   By: Lupita Raider M.D.   On: 11/06/2023 09:39   DG Chest Port 1 View Result Date: Nov 10, 2023 CLINICAL DATA:  Line placement EXAM: PORTABLE CHEST 1 VIEW COMPARISON:  11-10-23 earlier FINDINGS: Stable ET tube  and enteric tube. Left IJ catheter in place with tip along the central SVC above the right atrium. No pneumothorax. Stable enlarged cardiopericardial silhouette with some prominence of central vasculature. Persistent right lung base opacity with small right effusion. Dense right upper lung nodule consistent with old granulomatous disease. IMPRESSION: New left IJ catheter.  No pneumothorax. Electronically Signed   By:  Karen Kays M.D.   On: 11/05/2023 17:58   Portable Chest x-ray Result Date: 11/05/2023 CLINICAL DATA:  Intubation. EXAM: PORTABLE CHEST 1 VIEW COMPARISON:  Radiographs 11/04/2023 and 08/13/2023. FINDINGS: 1619 hours. Interval intubation. Tip of the endotracheal tube overlies the mid trachea. Enteric tube projects below the diaphragm, tip not visualized. The heart size and mediastinal contours are stable allowing for supine technique. Mildly increased opacities at both lung bases, greater on the right. No evidence of pneumothorax or significant pleural effusion. Unchanged calcified right upper lobe granuloma. No acute osseous findings. IMPRESSION: 1. Satisfactory position of support system. 2. Mildly increased bibasilar opacities, greater on the right, which could reflect atelectasis or pneumonia. Electronically Signed   By: Carey Bullocks M.D.   On: 11/05/2023 17:09   DG Abd Portable 1V-Small Bowel Obstruction Protocol-initial, 8 hr delay Result Date: 11/05/2023 CLINICAL DATA:  Small-bowel obstruction.  8 hour delayed film EXAM: PORTABLE ABDOMEN - 1 VIEW COMPARISON:  11/05/2023 at 1 a.m. FINDINGS: Abdominal x-ray obtained 8 hours after administration of small amount of oral contrast demonstrates a small amount of contrast in the fundus of the stomach. Enteric tube extending to the right of the spine, likely along the distal stomach. There is a right lower quadrant ostomy. The large bowel has scattered stool and is nondilated. No colonic contrast. There are several distended loops of small bowel in the mid abdomen with central loop measuring up to 3.3 cm in diameter. No contrast in the small bowel. Curvature and moderate degenerative changes of the spine. IMPRESSION: Small amount of contrast overlying the fundus of the stomach. No significant other areas of small large bowel contrast. Persistent dilated loops of small bowel in the mid abdomen. Right-sided ostomy.  Diffuse scattered colonic stool. NG tube  Electronically Signed   By: Karen Kays M.D.   On: 11/05/2023 13:36   DG Abd Portable 1V-Small Bowel Protocol-Position Verification Result Date: 11/05/2023 CLINICAL DATA:  NG tube placement EXAM: PORTABLE ABDOMEN - 1 VIEW COMPARISON:  CT today. FINDINGS: NG tube tip is in the stomach with the side port near the GE junction. IMPRESSION: NG tube in the proximal stomach. Electronically Signed   By: Charlett Nose M.D.   On: 11/05/2023 01:37   CT ABDOMEN PELVIS WO CONTRAST Result Date: 11/04/2023 CLINICAL DATA:  Acute nonlocalized abdominal pain. Worsening right upper quadrant abdominal pain tonight. Urostomy not draining. EXAM: CT ABDOMEN AND PELVIS WITHOUT CONTRAST TECHNIQUE: Multidetector CT imaging of the abdomen and pelvis was performed following the standard protocol without IV contrast. RADIATION DOSE REDUCTION: This exam was performed according to the departmental dose-optimization program which includes automated exposure control, adjustment of the mA and/or kV according to patient size and/or use of iterative reconstruction technique. COMPARISON:  08/15/2023 FINDINGS: Lower chest: Mild atelectasis in the lung bases. Small esophageal hiatal hernia. Hepatobiliary: Minimal free fluid along the anterior liver edge. No focal liver lesions. Gallbladder and bile ducts are unremarkable. Pancreas: Unremarkable. No pancreatic ductal dilatation or surrounding inflammatory changes. Spleen: Calcified granulomas in the spleen.  Normal-size. Adrenals/Urinary Tract: No adrenal gland nodules. Diffuse parenchymal atrophy involving the left kidney. Bilateral hydronephrosis  and hydroureter. A right lower quadrant urinary conduit is present. The urinary conduit is decompressed. Bladder is surgically absent. Stomach/Bowel: Stomach is fluid-filled without abnormal distention. Proximal small bowel are decompressed. Mid abdominal and right lower quadrant small bowel loops are dilated and fluid-filled with thickened wall. There  appears to be tethering in the right lower quadrant likely indicating an area of adhesions. This is near the apex of the ileal conduit. Scattered stool throughout the colon. There is a ventral abdominal wall hernia at the umbilicus containing fat, cecum, and terminal ileum. There is no obvious obstruction at this level. Multiple areas of surgical anastomosis are demonstrated in small bowel. Vascular/Lymphatic: Aortic atherosclerosis. No enlarged abdominal or pelvic lymph nodes. Reproductive: Prostate gland appears to be surgically absent. Other: Small amount of free fluid in the mesentery surrounding dilated bowel loops. No free air. Musculoskeletal: Degenerative changes in the spine. No acute bony abnormalities. IMPRESSION: 1. Right lower quadrant ileal conduit. Ileal conduit is decompressed and there is bilateral hydronephrosis and hydroureter. Left renal atrophy. 2. Dilated mid to lower abdominal small bowel with small bowel wall thickening in this area and mesenteric fluid. No pneumatosis. 3. Changes are likely to represent small bowel obstruction with possible vascular compromise causing small-bowel wall thickening. Level of obstruction appears to be in the right lower quadrant, likely adhesions. Obstruction may also be involving the proximal ileal conduit. 4. Aortic atherosclerosis. These results were called by telephone at the time of interpretation on 11/04/2023 at 9:41 pm to provider [employer] , who verbally acknowledged these results. Electronically Signed   By: Burman Nieves M.D.   On: 11/04/2023 21:45   DG Chest Portable 1 View Result Date: 11/04/2023 CLINICAL DATA:  Worsening right upper quadrant pain EXAM: PORTABLE CHEST 1 VIEW COMPARISON:  08/13/2023 FINDINGS: Single frontal view of the chest demonstrates an unremarkable cardiac silhouette. No airspace disease, effusion, or pneumothorax. No acute bony abnormalities. IMPRESSION: 1. No acute intrathoracic process. Electronically Signed   By:  Sharlet Salina M.D.   On: 11/04/2023 21:10    Anti-infectives: Anti-infectives (From admission, onward)    Start     Dose/Rate Route Frequency Ordered Stop   11/06/23 0400  ceFEPIme (MAXIPIME) 1 g in sodium chloride 0.9 % 100 mL IVPB  Status:  Discontinued        1 g 200 mL/hr over 30 Minutes Intravenous Every 24 hours 11/05/23 0456 11/05/23 1419   11/06/23 0400  ceFEPIme (MAXIPIME) 2 g in sodium chloride 0.9 % 100 mL IVPB        2 g 200 mL/hr over 30 Minutes Intravenous Every 24 hours 11/05/23 1419     11/05/23 1200  metroNIDAZOLE (FLAGYL) IVPB 500 mg        500 mg 100 mL/hr over 60 Minutes Intravenous Every 12 hours 11/05/23 1106     11/05/23 0345  ceFEPIme (MAXIPIME) 2 g in sodium chloride 0.9 % 100 mL IVPB        2 g 200 mL/hr over 30 Minutes Intravenous  Once 11/05/23 0331 11/05/23 0424       Assessment/Plan:  Sincerely appreciate all services (critical care, gen surg, nephrol) comanagment of this critically ill pt who is well known to [place].  Suspect most of acute GFR decline sepsis / hypoperfusion. Left kidney with minimal function and now s/p purposeful and necessary ureteral ligation as necrotic bowel was tethered around it. We will consider formal left renal drainage (neph tube) or even retroperitoneal nephrectomy if this beocmes more of a  liability.   Will follow. Please call me directly with questions anytime.    Loletta Parish. 11/06/2023

## 2023-11-07 DIAGNOSIS — G9340 Encephalopathy, unspecified: Secondary | ICD-10-CM

## 2023-11-07 DIAGNOSIS — K56609 Unspecified intestinal obstruction, unspecified as to partial versus complete obstruction: Secondary | ICD-10-CM | POA: Diagnosis not present

## 2023-11-07 DIAGNOSIS — A4150 Gram-negative sepsis, unspecified: Secondary | ICD-10-CM

## 2023-11-07 DIAGNOSIS — R579 Shock, unspecified: Secondary | ICD-10-CM | POA: Diagnosis not present

## 2023-11-07 LAB — CBC WITH DIFFERENTIAL/PLATELET
Abs Immature Granulocytes: 0 10*3/uL (ref 0.00–0.07)
Basophils Absolute: 0 10*3/uL (ref 0.0–0.1)
Basophils Relative: 0 %
Eosinophils Absolute: 0 10*3/uL (ref 0.0–0.5)
Eosinophils Relative: 0 %
HCT: 29 % — ABNORMAL LOW (ref 39.0–52.0)
Hemoglobin: 9.4 g/dL — ABNORMAL LOW (ref 13.0–17.0)
Lymphocytes Relative: 3 %
Lymphs Abs: 0.4 10*3/uL — ABNORMAL LOW (ref 0.7–4.0)
MCH: 27.9 pg (ref 26.0–34.0)
MCHC: 32.4 g/dL (ref 30.0–36.0)
MCV: 86.1 fL (ref 80.0–100.0)
Monocytes Absolute: 0.1 10*3/uL (ref 0.1–1.0)
Monocytes Relative: 1 %
Neutro Abs: 12.6 10*3/uL — ABNORMAL HIGH (ref 1.7–7.7)
Neutrophils Relative %: 96 %
Platelets: 49 10*3/uL — ABNORMAL LOW (ref 150–400)
RBC: 3.37 MIL/uL — ABNORMAL LOW (ref 4.22–5.81)
RDW: 13.8 % (ref 11.5–15.5)
WBC: 13.1 10*3/uL — ABNORMAL HIGH (ref 4.0–10.5)
nRBC: 0 % (ref 0.0–0.2)

## 2023-11-07 LAB — GLUCOSE, CAPILLARY
Glucose-Capillary: 100 mg/dL — ABNORMAL HIGH (ref 70–99)
Glucose-Capillary: 100 mg/dL — ABNORMAL HIGH (ref 70–99)
Glucose-Capillary: 105 mg/dL — ABNORMAL HIGH (ref 70–99)
Glucose-Capillary: 111 mg/dL — ABNORMAL HIGH (ref 70–99)
Glucose-Capillary: 86 mg/dL (ref 70–99)
Glucose-Capillary: 88 mg/dL (ref 70–99)
Glucose-Capillary: 93 mg/dL (ref 70–99)

## 2023-11-07 LAB — SURGICAL PATHOLOGY

## 2023-11-07 LAB — URINE CULTURE: Culture: >=100000 — AB

## 2023-11-07 LAB — CULTURE, BLOOD (ROUTINE X 2)

## 2023-11-07 LAB — BASIC METABOLIC PANEL
Anion gap: 9 (ref 5–15)
BUN: 57 mg/dL — ABNORMAL HIGH (ref 8–23)
CO2: 23 mmol/L (ref 22–32)
Calcium: 7.1 mg/dL — ABNORMAL LOW (ref 8.9–10.3)
Chloride: 110 mmol/L (ref 98–111)
Creatinine, Ser: 3.75 mg/dL — ABNORMAL HIGH (ref 0.61–1.24)
GFR, Estimated: 16 mL/min — ABNORMAL LOW (ref >60)
Glucose, Bld: 95 mg/dL (ref 70–99)
Potassium: 3.7 mmol/L (ref 3.5–5.1)
Sodium: 142 mmol/L (ref 135–145)

## 2023-11-07 LAB — BLOOD GAS, ARTERIAL
Acid-Base Excess: 0.9 mmol/L (ref 0.0–2.0)
Bicarbonate: 23.8 mmol/L (ref 20.0–28.0)
O2 Saturation: 99.2 %
Patient temperature: 37.1
pCO2 arterial: 32 mmHg (ref 32–48)
pH, Arterial: 7.48 — ABNORMAL HIGH (ref 7.35–7.45)
pO2, Arterial: 83 mmHg (ref 83–108)

## 2023-11-07 LAB — PHOSPHORUS: Phosphorus: 3.6 mg/dL (ref 2.5–4.6)

## 2023-11-07 LAB — MAGNESIUM: Magnesium: 1.8 mg/dL (ref 1.7–2.4)

## 2023-11-07 LAB — LACTIC ACID, PLASMA: Lactic Acid, Venous: 1.3 mmol/L (ref 0.5–1.9)

## 2023-11-07 MED ORDER — DEXTROSE IN LACTATED RINGERS 5 % IV SOLN: INTRAVENOUS | Status: AC

## 2023-11-07 MED ORDER — CALCIUM GLUCONATE-NACL 1-0.675 GM/50ML-% IV SOLN
1.0000 g | Freq: Once | INTRAVENOUS | Status: AC
  Administered 2023-11-07: 1000 mg via INTRAVENOUS
  Filled 2023-11-07: qty 50

## 2023-11-07 MED ORDER — ORAL CARE MOUTH RINSE: 15.0000 mL | OROMUCOSAL | Status: DC | PRN

## 2023-11-07 MED ORDER — FENTANYL CITRATE PF 50 MCG/ML IJ SOSY
25.0000 ug | PREFILLED_SYRINGE | INTRAMUSCULAR | Status: DC | PRN
  Administered 2023-11-07 – 2023-11-11 (×16): 50 ug via INTRAVENOUS
  Administered 2023-11-14: 25 ug via INTRAVENOUS
  Filled 2023-11-07 (×17): qty 1

## 2023-11-07 NOTE — Plan of Care (Signed)
  Problem: Coping: Goal: Ability to adjust to condition or change in health will improve Outcome: Progressing   Problem: Fluid Volume: Goal: Ability to maintain a balanced intake and output will improve Outcome: Progressing   Problem: Metabolic: Goal: Ability to maintain appropriate glucose levels will improve Outcome: Progressing   Problem: Skin Integrity: Goal: Risk for impaired skin integrity will decrease Outcome: Progressing   Problem: Clinical Measurements: Goal: Respiratory complications will improve Outcome: Progressing Goal: Cardiovascular complication will be avoided Outcome: Progressing   Problem: Activity: Goal: Risk for activity intolerance will decrease Outcome: Progressing   Problem: Coping: Goal: Level of anxiety will decrease Outcome: Progressing   Problem: Pain Managment: Goal: General experience of comfort will improve and/or be controlled Outcome: Progressing

## 2023-11-07 NOTE — Progress Notes (Addendum)
 NAME:  Benjamin Hensley, MRN:  [id number], DOB:  1941-01-27, LOS: 3 ADMISSION DATE:  11/04/2023 CONSULTATION DATE:  11/05/2023 REFERRING MD:  Jonathon Bellows - [facility] CHIEF COMPLAINT:  Abdominal pain, concern for developing urosepsis   History of Present Illness:  83 year old man who presented to [facility] ED 2/24 for abdominal pain. PMHx significant for HLD, SBO (s/p ex-lap/SBR 02/2022), aggressive bladder/prostate CA requiring ileal conduit (followed by Urology - Dr. 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  Patient initially presented to [facility] 2/24 for sudden onset of abdominal pain around 1200 with nausea, vomiting and chills. EMS was called and brought patient to ED. While in ED, patient was noted to be febrile to Tmax 103.97F, tachycardic to 100s, tachypneic to 20s-low 30s, BP 150s/80s. Labs were notable for WBC 8.3, Hgb 14.1, Plt 146. Na 131, K 4.3, CO2 21, Cr 2.71 (baseline variable), LFTs WNL. Trop unremarkable. UA with small Hgb, 100 protein, large leuks, rare bacteria. Blood Cx and Urine Cx pending. Empiric broad-spectrum antibiotics (cefepime/Flagyl) started.   On 2/25AM while awaiting bed placement, patient was noted to be persistently febrile to 101.40F with stable WBC but rising LA to 7.1 and Cr nearly doubled 4.45 (2.71). Hemodynamically, patient became borderline hypotensive.  PCCM consulted for possible ICU management.  Pertinent Medical History:   Past Medical History:  Diagnosis Date   Bladder cancer (HCC)    Chronic kidney disease    Dementia (HCC)    History of kidney stones    Medical history non-contributory    Meningitis    hx of as a child affected right eye   Significant Hospital Events: Including procedures, antibiotic start and stop dates in addition to other pertinent events   2/24 - Presented to [facility] with abdominal pain, n/v. Extensive urologic history. Febrile, WBC  WNL, normotensive with plan for floor admission. 2/25 - Persistently febrile with soft BP, rising LA to 7.1. PCCM consulted. Taken to OR for ex-lap; found to have closed-loop SBO with necrosis (resected) 2/2 tight band (nonfunctional L ureter) which was ligated. Taken to ICU postoperatively on 2 vasopressors and intubated. LIJ CVC placed for access. 2/26 - Sedation weaned, extubated.  Interim History / Subjective:  POD#2 from ex-lap/LOA and L ureteral ligation No significant events overnight, low grade temp 99.40F Remains minimally verbal, awake/will intermittently follow commands, appears uncomfortable ABG check to r/o hypercarbia/acidosis as contributing factors; mildly alkalotic, gas otherwise WNL Bicarb gtt discontinued LA repeated Off of Levophed Urostomy with increased mucous/purulent material Plt 49, held Lovenox  Objective:  Blood pressure (!) 91/49, pulse 89, temperature 98.1 F (36.7 C), temperature source Axillary, resp. rate 20, height 5\' 11"  (1.803 m), weight 83.3 kg, SpO2 96%.    Vent Mode: CPAP;PSV FiO2 (%):  [30 %] 30 % PEEP:  [5 cmH20] 5 cmH20 Pressure Support:  [8 cmH20] 8 cmH20   Intake/Output Summary (Last 24 hours) at 11/07/2023 0810 Last data filed at 11/07/2023 0644 Gross per 24 hour  Intake 2655.12 ml  Output 2580 ml  Net 75.12 ml   Filed Weights   11/05/23 1758 11/06/23 0704 11/07/23 0500  Weight: 92.6 kg 95.3 kg 83.3 kg   Physical Examination: General: Acutely ill-appearing older man in NAD. Appears uncomfortable. HEENT: [place]/AT, anicteric sclera, PERRL, moist mucous membranes. Neuro:  Awake, disoriented/intermittently drowsy.  Responds to verbal stimuli. Following commands intermittently (slow to follow). Moves all 4 extremities spontaneously. Occasionally raising both arms and shaking. No focal  neurologic deficits. CV: RRR, no m/g/r. PULM: Breathing even and unlabored on 2LNC. Lung fields CTAB. GI: Soft, mildly distended. Generalized TTP throughout the  abdomen. Midline abdominal wound with WTD dressing in place (fascia closed, skin open). Hypoactive bowel sounds. Urostomy/ileal conduit with increased mucopurulent drainage in bag, urine clear yellow. JP drain with scant serosanguinous output. Extremities: Trace symmetric BLE edema noted. Skin: Warm/dry, no rashes.  Resolved Hospital Problem List:    Assessment & Plan:  Undifferentiated shock, presumed septic versus hypovolemic in the setting of GI losses with SBO Klebsiella pneumoniae bacteremia 2/2 Klebsiella UTI - Goal MAP > 65 - Fluid resuscitation as tolerated - Levophed titrated to goal MAP, off as of 2/27AM - Trend WBC, fever curve, LA - F/u Cx data (OR Cx sent) - Continue broad-spectrum antibiotics (narrowed to ceftriaxone 2/26)  SBO, s/p ex-lap/LOA and SBR 2/25 Nausea with vomiting History of SBO (s/p ex-lap/SBR 02/2022). - POD#2 from ex-lap/LOA/SBR - Postoperative management per CCS - Multimodal pain control (IV Fentanyl, APAP, Lidoderm); consider PO oxycodone when able - Fascia closed, midline wound with skin open/WTD dressings in place - Possible WV placement 2/28, JP remains in place - NGT to LIWS, await ROBF - D5LR fluids 2/27 for some caloric intake/borderline glucoses while awaiting ability to use gut  Acute encephalopathy, presumed multifactorial in the setting of sepsis, ARF, delirium, mild baseline dementia - Treat underlying issues (sepsis) - Correct metabolic derangements - Limit sedating medications as able, suspect taking longer to clear sedation due to poor renal function - Avoid interruptions to normal sleep/wake cycle as able - No focal neurologic deficits; low threshold for CT Head if mental status changes  Acute hypoxemic respiratory failure in the setting of sepsis, resolved - Supplemental O2 support as needed for SpO2 > 90% - Pulmonary hygiene - Encourage OOB/mobility when cleared by surgery  AKI on CKD stage IV Recurrent pyelonephritis/bacteruria  post-cystectomy Chronic hydronephrotic L kidney, s/p L ureter ligation 2/25 Low grade R ureteral stricture Aggressive bladder/prostate CA requiring ileal conduit (followed by Urology - Dr. Berneice Heinrich) - Urology following, appreciate recommendations - S/p ligation of nonfunctional L ureter 2/25 - Renal indices stable today - Improved UOP from urostomy, but increased mucopurulent output noted in bag - Ceftriaxone as above - Trend BMP - Replete electrolytes as indicated - Monitor I&Os - Avoid nephrotoxic agents as able - Ensure adequate renal perfusion  HLD - Resume statin  Dementia - Resume Zoloft  Best Practice: (right click and "Reselect all SmartList Selections" daily)   Diet/type: NPO DVT prophylaxis: SCDs GI prophylaxis: PPI Lines: Central line and Arterial Line Foley:  N/A - Urostomy in place with ileal conduit Code Status:  full code Last date of multidisciplinary goals of care discussion [2/26 - Family (son, [name]) updated via phone]  Critical care time:   The patient is critically ill with multiple organ system failure and requires high complexity decision making for assessment and support, frequent evaluation and titration of therapies, advanced monitoring, review of radiographic studies and interpretation of complex data.   Critical Care Time devoted to patient care services, exclusive of separately billable procedures, described in this note is 37 minutes.  Tim Lair, PA-C [facility] 11/07/23 8:10 AM  Please see [email or web address] for pager details.  From 7A-7P if no response, please call [phone] After hours, please call ELink [phone]

## 2023-11-07 NOTE — Progress Notes (Signed)
 PT Cancellation Note  Patient Details Name: Benjamin Hensley MRN: [id number] DOB: [birth year]   Cancelled Treatment:    Reason Eval/Treat Not Completed: Medical issues which prohibited therapy Pain and   lethargy, per  RN.  Blanchard Kelch PT Acute Rehabilitation Services Office [phone] Rada Hay 11/07/2023, 1:48 PM

## 2023-11-07 NOTE — Progress Notes (Signed)
 [facility] Progress Note  2 Days Post-Op  Subjective: CC:  Extubated.  Not speaking to me this morning but did FC to wiggle toes.  Remains off of pressors. Creatinine stable. Making urine via urostomy.  Objective: Vital signs in last 24 hours: Temp:  [98.1 F (36.7 C)-99.6 F (37.6 C)] 98.1 F (36.7 C) (02/27 0325) Pulse Rate:  [73-114] 89 (02/27 0800) Resp:  [8-27] 25 (02/27 0800) BP: (91-145)/(42-71) 134/71 (02/27 0800) SpO2:  [95 %-98 %] 96 % (02/27 0800) Arterial Line BP: (91-155)/(41-74) 145/66 (02/27 0800) FiO2 (%):  [30 %] 30 % (02/26 0953) Weight:  [83.3 kg] 83.3 kg (02/27 0500)    Intake/Output from previous day: 02/26 0701 - 02/27 0700 In: 2655.1 [I.V.:2163.2; IV Piggyback:492] Out: 2580 [Urine:2175; Emesis/NG output:250; Drains:155] Intake/Output this shift: Total I/O In: 154.8 [I.V.:154.8] Out: -   PE: Gen:  appears uncomfortable Card:  Regular rate and rhythm, mild edema of the extremities Pulm:  normal effort on nasal cannula Abd: Soft, overall non-distended, midline wound c/d/I - fascia in tact, urostomy RUQ productive of clear, yellow urine (>2L). JP drain in pelvis is SS.  Skin: warm and dry, no rashes, LUE with ecchymosis - looks like his IV blew Psych: unable to assess.  Neuro: opens eyes and to voice. Moving all of his extremities. Following commands. Non-focal exam  Lab Results:  Recent Labs    11/06/23 0314 11/07/23 0516  WBC 29.8* 13.1*  HGB 10.4* 9.4*  HCT 31.9* 29.0*  PLT 75* 49*   BMET Recent Labs    11/06/23 0314 11/07/23 0516  NA 136 142  K 4.2 3.7  CL 105 110  CO2 20* 23  GLUCOSE 156* 95  BUN 59* 57*  CREATININE 3.66* 3.75*  CALCIUM 7.2* 7.1*   PT/INR Recent Labs    11/05/23 1628 11/06/23 0314  LABPROT 19.7* 21.0*  INR 1.6* 1.8*   CMP     Component Value Date/Time   NA 142 11/07/2023 0516   K 3.7 11/07/2023 0516   CL 110 11/07/2023 0516   CO2 23 11/07/2023 0516   GLUCOSE 95 11/07/2023 0516    BUN 57 (H) 11/07/2023 0516   CREATININE 3.75 (H) 11/07/2023 0516   CALCIUM 7.1 (L) 11/07/2023 0516   PROT 5.3 (L) 11/06/2023 0314   ALBUMIN 2.6 (L) 11/06/2023 0314   AST 27 11/06/2023 0314   ALT 16 11/06/2023 0314   ALKPHOS 39 11/06/2023 0314   BILITOT 0.8 11/06/2023 0314   GFRNONAA 16 (L) 11/07/2023 0516   Lipase     Component Value Date/Time   LIPASE 38 11/14/2021 1355       Studies/Results: Portable Chest xray Result Date: 11/06/2023 CLINICAL DATA:  Endotracheal tube placement. EXAM: PORTABLE CHEST 1 VIEW COMPARISON:  November 05, 2023. FINDINGS: The heart size and mediastinal contours are within normal limits. Endotracheal and nasogastric tubes are unchanged in position. Left internal jugular catheter is unchanged. Increased bibasilar opacities are noted concerning for worsening atelectasis or infiltrates. The visualized skeletal structures are unremarkable. IMPRESSION: Stable support apparatus. Increased bibasilar opacities as noted above. Electronically Signed   By: Lupita Raider M.D.   On: 11/06/2023 09:39   DG Chest Port 1 View Result Date: 11/05/2023 CLINICAL DATA:  Line placement EXAM: PORTABLE CHEST 1 VIEW COMPARISON:  11/05/2023 earlier FINDINGS: Stable ET tube and enteric tube. Left IJ catheter in place with tip along the central SVC above the right atrium. No pneumothorax. Stable enlarged cardiopericardial silhouette with some prominence of central  vasculature. Persistent right lung base opacity with small right effusion. Dense right upper lung nodule consistent with old granulomatous disease. IMPRESSION: New left IJ catheter.  No pneumothorax. Electronically Signed   By: Karen Kays M.D.   On: 11/05/2023 17:58   Portable Chest x-ray Result Date: 11/05/2023 CLINICAL DATA:  Intubation. EXAM: PORTABLE CHEST 1 VIEW COMPARISON:  Radiographs 11/04/2023 and 08/13/2023. FINDINGS: 1619 hours. Interval intubation. Tip of the endotracheal tube overlies the mid trachea. Enteric tube  projects below the diaphragm, tip not visualized. The heart size and mediastinal contours are stable allowing for supine technique. Mildly increased opacities at both lung bases, greater on the right. No evidence of pneumothorax or significant pleural effusion. Unchanged calcified right upper lobe granuloma. No acute osseous findings. IMPRESSION: 1. Satisfactory position of support system. 2. Mildly increased bibasilar opacities, greater on the right, which could reflect atelectasis or pneumonia. Electronically Signed   By: Carey Bullocks M.D.   On: 11/05/2023 17:09   DG Abd Portable 1V-Small Bowel Obstruction Protocol-initial, 8 hr delay Result Date: 11/05/2023 CLINICAL DATA:  Small-bowel obstruction.  8 hour delayed film EXAM: PORTABLE ABDOMEN - 1 VIEW COMPARISON:  11/05/2023 at 1 a.m. FINDINGS: Abdominal x-ray obtained 8 hours after administration of small amount of oral contrast demonstrates a small amount of contrast in the fundus of the stomach. Enteric tube extending to the right of the spine, likely along the distal stomach. There is a right lower quadrant ostomy. The large bowel has scattered stool and is nondilated. No colonic contrast. There are several distended loops of small bowel in the mid abdomen with central loop measuring up to 3.3 cm in diameter. No contrast in the small bowel. Curvature and moderate degenerative changes of the spine. IMPRESSION: Small amount of contrast overlying the fundus of the stomach. No significant other areas of small large bowel contrast. Persistent dilated loops of small bowel in the mid abdomen. Right-sided ostomy.  Diffuse scattered colonic stool. NG tube Electronically Signed   By: Karen Kays M.D.   On: 11/05/2023 13:36    Anti-infectives: Anti-infectives (From admission, onward)    Start     Dose/Rate Route Frequency Ordered Stop   11/07/23 0500  cefTRIAXone (ROCEPHIN) 2 g in sodium chloride 0.9 % 100 mL IVPB        2 g 200 mL/hr over 30 Minutes  Intravenous Every 24 hours 11/06/23 1046     11/06/23 0400  ceFEPIme (MAXIPIME) 1 g in sodium chloride 0.9 % 100 mL IVPB  Status:  Discontinued        1 g 200 mL/hr over 30 Minutes Intravenous Every 24 hours 11/05/23 0456 11/05/23 1419   11/06/23 0400  ceFEPIme (MAXIPIME) 2 g in sodium chloride 0.9 % 100 mL IVPB  Status:  Discontinued        2 g 200 mL/hr over 30 Minutes Intravenous Every 24 hours 11/05/23 1419 11/06/23 1046   11/05/23 1200  metroNIDAZOLE (FLAGYL) IVPB 500 mg  Status:  Discontinued        500 mg 100 mL/hr over 60 Minutes Intravenous Every 12 hours 11/05/23 1106 11/06/23 1046   11/05/23 0345  ceFEPIme (MAXIPIME) 2 g in sodium chloride 0.9 % 100 mL IVPB        2 g 200 mL/hr over 30 Minutes Intravenous  Once 11/05/23 0331 11/05/23 0424        Assessment/Plan SBO 83 y/o M w/ history of bladder cancer, cystoprostatectomy and ileal conduit, ex lap for SBO in 2023 who  presented with acute abdominal pain and vomiting. CT significant for SBO in RLQ. He was in septic shock in the ED and was taken emergently to the operating room where a closed loop SBO was found.  S/P EXPLORATORY LAPAROTOMY; lysis of adhesions; small bowel resection; ligation of left ureter; primary hernia repair 2/25 Dr. Carolynne Edouard (with Dr. Alvester Morin, Dr. Berneice Heinrich)  -  POD#2, afebrile, WBC 13 from 29, hgb stable - continue NG to LIWS and await bowel function - twice daily moist-to-dry dressing changes to midline abdominal wound. Consider VAC placement Friday.  - continue JP drain, it is sitting in the pelvis where the left ureter was ligated. If drain output increases consider urine leak and check drain creatinine  - appreciate CCM mgmt of septic shock. Hemodynamics and renal function improving  - IV tylenol, lidoderm patch for pain. Avoid NSAIDs/IV robaxin given AKI/CKD. Will speak to CCM but would like to start some IV narcotic meds as he seems very uncomfortable this morning. Delirium precautions as able    FEN: NPO, IVF  per primary, NG to LIWS ID: cefepime, flagyl Foley: none - urostomy productive VTE: daily lovenox Dispo: ICU       LOS: 3 days   I reviewed nursing notes, hospitalist notes, last 24 h vitals and pain scores, last 48 h intake and output, last 24 h labs and trends, and last 24 h imaging results.  This care required moderate level of medical decision making.   Hosie Spangle, PA-C [facility] Please see Amion for pager number during day hours 7:00am-4:30pm

## 2023-11-07 NOTE — Progress Notes (Signed)
 [facility] Progress Note  Name: Benjamin Hensley MRN: [id number] DOB: 03/10/1941  Chief Complaint:  N/v/fever   Subjective:  He had 2.2 liters UOP over 2/26.  He has had another 1.3 liters UOP thus far today.  He has been on 2 liters still.  Nonverbal today.  He has mittens - ripped out his arterial line.  He forgets where he is quickly per his nurse.  He has been more alert - he spoke with his son today per the nurse.  His wife just had a planned surgery yesterday.  I called his son to update him.   Review of systems:   Unable to obtain given patient factors - he is again nonverbal for me today   --------------- Background on consult:  Benjamin Hensley is a 83 y.o. male with a history of CKD stage IV, obstructive uropathy and bladder/prostate cancer presented to the hospital with nausea and vomiting as well as fever.  He has a bladder conduit in place for known bilateral hydronephrosis.  He was found to have a small bowel obstruction.  Note that the bowel obstruction may also involve the ileal conduit per imaging.  Surgery and urology were consulted.  Nephrology is consulted for assistance with management of AKI. Earlier today he underwent ex-lap with ligation of left distal ureter and repair of ileal conduit.  Also noted patient with small bowel resection with surgery today in combined case.  He has been ordered for a bicarb gtt - pharmacy is sending this up.  He has been transferred to the ICU.  He is intubated and on levophed at 10 mcg/min and phenylephrine at 70 mcg/min.  Transitioning to levo per RN report.  He follows with Dr. Berneice Heinrich in urology.  I spoke with his wife via phone.  He does not have a nephrologist.  Per his wife the patient has no history of dementia though this is charted.           Intake/Output Summary (Last 24 hours) at 11/07/2023 1706 Last data filed at 11/07/2023 1651 Gross per 24 hour  Intake 1894.33 ml  Output 2920 ml  Net -1025.67 ml    Vitals:   Vitals:   11/07/23 1300 11/07/23 1400 11/07/23 1500 11/07/23 1600  BP:  101/70 106/61   Pulse: 79 67 85   Resp: 15 16 (!) 21   Temp:    98.1 F (36.7 C)  TempSrc:    Axillary  SpO2: 94% 97% 97%   Weight:      Height:         Physical Exam:    General elderly male in bed in no acute distress HEENT normocephalic atraumatic extraocular movements intact sclera anicteric Neck supple trachea midline Lungs clear to auscultation bilaterally anteriorly normal work of breathing at rest on 2 liters Heart S1S2, no rub Abdomen soft; midline incision dressed; urostomy in place Extremities no pitting edema  Neuro - he is nonverbal and does not open eyes for me or follow commands    Medications reviewed   Labs:     Latest Ref Rng & Units 11/07/2023    5:16 AM 11/06/2023    3:14 AM 11/05/2023    4:28 PM  BMP  Glucose 70 - 99 mg/dL 95  301  601   BUN 8 - 23 mg/dL 57  59  61   Creatinine 0.61 - 1.24 mg/dL 0.93  2.35  5.73   Sodium 135 - 145 mmol/L 142  136  139  Potassium 3.5 - 5.1 mmol/L 3.7  4.2  4.8   Chloride 98 - 111 mmol/L 110  105  114   CO2 22 - 32 mmol/L 23  20  15    Calcium 8.9 - 10.3 mg/dL 7.1  7.2  7.7      Assessment/Plan:   # AKI - secondary to ischemic and prerenal insults with small bowel obstruction  - improving with supportive measures - nonoliguric    # Small bowel obstruction - s/p emergent ex-lap on 2/25 - Per surgery and critical care   # Obstructive uropathy - Ileal conduit in place - Per urology   # Normocytic anemia - setting of emergent surgery and hydration - PRBC's per primary team discretion    # CKD stage IV - Noted creatinine 2.5 in 2023   - will need to plan to set up follow-up with [facility]    # Metabolic acidosis - lactic acidosis and now s/p bowel resection  - s/p bicarb gtt  # Hypocalcemia - calcium gluconate once   # Thrombocytopenia - note this is worsening - he is not currently on heparin  - per primary team    Disposition  - in the ICU  Estanislado Emms, MD 11/07/2023 5:22 PM

## 2023-11-07 NOTE — Progress Notes (Signed)
 Arterial line at this time has good waveform, draws and flushes well, insertion WNL.

## 2023-11-08 DIAGNOSIS — K56609 Unspecified intestinal obstruction, unspecified as to partial versus complete obstruction: Secondary | ICD-10-CM | POA: Diagnosis not present

## 2023-11-08 DIAGNOSIS — R579 Shock, unspecified: Secondary | ICD-10-CM | POA: Diagnosis not present

## 2023-11-08 DIAGNOSIS — R1084 Generalized abdominal pain: Secondary | ICD-10-CM | POA: Diagnosis not present

## 2023-11-08 DIAGNOSIS — G9340 Encephalopathy, unspecified: Secondary | ICD-10-CM | POA: Diagnosis not present

## 2023-11-08 DIAGNOSIS — D696 Thrombocytopenia, unspecified: Secondary | ICD-10-CM

## 2023-11-08 LAB — COMPREHENSIVE METABOLIC PANEL
ALT: 20 U/L (ref 0–44)
AST: 27 U/L (ref 15–41)
Albumin: 2.1 g/dL — ABNORMAL LOW (ref 3.5–5.0)
Alkaline Phosphatase: 69 U/L (ref 38–126)
Anion gap: 6 (ref 5–15)
BUN: 49 mg/dL — ABNORMAL HIGH (ref 8–23)
CO2: 25 mmol/L (ref 22–32)
Calcium: 7.6 mg/dL — ABNORMAL LOW (ref 8.9–10.3)
Chloride: 114 mmol/L — ABNORMAL HIGH (ref 98–111)
Creatinine, Ser: 2.63 mg/dL — ABNORMAL HIGH (ref 0.61–1.24)
GFR, Estimated: 24 mL/min — ABNORMAL LOW (ref >60)
Glucose, Bld: 120 mg/dL — ABNORMAL HIGH (ref 70–99)
Potassium: 3.6 mmol/L (ref 3.5–5.1)
Sodium: 145 mmol/L (ref 135–145)
Total Bilirubin: 0.5 mg/dL (ref 0.0–1.2)
Total Protein: 5 g/dL — ABNORMAL LOW (ref 6.5–8.1)

## 2023-11-08 LAB — CBC
HCT: 28.8 % — ABNORMAL LOW (ref 39.0–52.0)
Hemoglobin: 9.1 g/dL — ABNORMAL LOW (ref 13.0–17.0)
MCH: 27.8 pg (ref 26.0–34.0)
MCHC: 31.6 g/dL (ref 30.0–36.0)
MCV: 88.1 fL (ref 80.0–100.0)
Platelets: 53 10*3/uL — ABNORMAL LOW (ref 150–400)
RBC: 3.27 MIL/uL — ABNORMAL LOW (ref 4.22–5.81)
RDW: 14.1 % (ref 11.5–15.5)
WBC: 9.6 10*3/uL (ref 4.0–10.5)
nRBC: 0 % (ref 0.0–0.2)

## 2023-11-08 LAB — GLUCOSE, CAPILLARY
Glucose-Capillary: 102 mg/dL — ABNORMAL HIGH (ref 70–99)
Glucose-Capillary: 106 mg/dL — ABNORMAL HIGH (ref 70–99)
Glucose-Capillary: 109 mg/dL — ABNORMAL HIGH (ref 70–99)
Glucose-Capillary: 110 mg/dL — ABNORMAL HIGH (ref 70–99)
Glucose-Capillary: 111 mg/dL — ABNORMAL HIGH (ref 70–99)
Glucose-Capillary: 124 mg/dL — ABNORMAL HIGH (ref 70–99)

## 2023-11-08 LAB — PHOSPHORUS
Phosphorus: 2.2 mg/dL — ABNORMAL LOW (ref 2.5–4.6)
Phosphorus: 2.6 mg/dL (ref 2.5–4.6)

## 2023-11-08 LAB — MAGNESIUM
Magnesium: 2 mg/dL (ref 1.7–2.4)
Magnesium: 2.1 mg/dL (ref 1.7–2.4)

## 2023-11-08 MED ORDER — K PHOS MONO-SOD PHOS DI & MONO 155-852-130 MG PO TABS
250.0000 mg | ORAL_TABLET | Freq: Once | ORAL | Status: DC
  Filled 2023-11-08: qty 1

## 2023-11-08 MED ORDER — POTASSIUM PHOSPHATES 15 MMOLE/5ML IV SOLN
15.0000 mmol | Freq: Once | INTRAVENOUS | Status: AC
  Administered 2023-11-08: 15 mmol via INTRAVENOUS
  Filled 2023-11-08: qty 5

## 2023-11-08 MED ORDER — OSMOLITE 1.5 CAL PO LIQD
1000.0000 mL | ORAL | Status: DC
  Administered 2023-11-08: 1000 mL
  Filled 2023-11-08: qty 1000

## 2023-11-08 NOTE — Progress Notes (Addendum)
 Nutrition Follow-up DOCUMENTATION CODES:   Not applicable  INTERVENTION:  Initiate tube feeding via NG tube per CCS: Begin trickle feeds of Osmolite 1.5 at 47mL/hr and advance to 72mL/hr after 6 hours if tolerated   - Pt at high refeed risk; recommend monitor potassium, magnesium and phosphorus labs BID until stable;  Daily thiamine 100mg  (5-7 days) and MVI (10 days) supplementation  - Once CCS determines trickle feeds can be advanced, would recommend below advancements and goal recommendations: Osmolite 1.5 at  55 ml/h (1320 ml per day) *Consider advancing by 2mL/hr every 12 hours as tolerated to goal rate Prosource TF20 60 ml 1 x per day Provides 2060 kcal, 103 gm protein, 1013 ml free water daily   - Would recommend free water flush 4 x per day   NUTRITION DIAGNOSIS:  Inadequate oral intake related to inability to eat as evidenced by NPO status.  Still relevant  GOAL:  Patient will meet greater than or equal to 90% of their needs  Ongoing  MONITOR:  Labs, Diet advancement, TF tolerance  REASON FOR ASSESSMENT:  Consult Enteral/tube feeding initiation and management  ASSESSMENT:  83 y.o. male with PMH significant for HLD, SBO (s/p ex-lap/SBR 02/2022), aggressive bladder/prostate CA requiring ileal conduit, CKD stage IV, dementia who presented for sudden onset of abdominal pain around 1200 with nausea, vomiting and chills. Admitted with septic shock and small bowel obstruction.  2/24 Admit 2/25 s/ ex-lap, found to have closed-loop SBO with necrosis s/p SBR; remained intubated post-op 2/26 weaned and extubated 2/28 plan for trickle feed initiation  Family not present at time of follow-up. Unable to obtain past diet hx or recent wt hx. Pt awake but not well oriented. Pt alerted to sound of my voice, but unable to communicate. Discussed beginning trickle feeds of enteral nutrition via NG tube and slowly advancing as CCS deems appropriate. Pt nodded head.  Spoke with  RN and discussed order for Osmolite 1.5 and to begin trickle feeds at 71mL/hr and to advance to 49mL/hr after 6 hours if tolerated. RN stated she would begin trickle feeds soon and continue to monitor. Discussed plan for advancement based on pt's calorie and protein needs when CCS gives approval to advance.  Diet Order:   Diet Order             Diet NPO time specified  Diet effective now                  EDUCATION NEEDS:  Not appropriate for education at this time  Skin:  Skin Assessment: Skin Integrity Issues: Skin Integrity Issues:: Incisions Incisions: Abdomen  Last BM:  unknown  Height:  Ht Readings from Last 1 Encounters:  11/05/23 5\' 11"  (1.803 m)    Weight:  Wt Readings from Last 1 Encounters:  11/08/23 70.8 kg    BMI:  Body mass index is 21.77 kg/m.  Estimated Nutritional Needs:  Kcal:  2000-2300 kcals  Protein:  100-120 grams  Fluid:  >/= 2L  Kristen McElhenny Dietetic Intern    I have reviewed the dietetic intern's note and agree with the findings and interventions.  Shelle Iron RD, LDN Contact via [employer].

## 2023-11-08 NOTE — Progress Notes (Signed)
 3 Days Post-Op   Subjective/Chief Complaint:   1- Bladder + Prostate Cancer - s/p cystoprostatectomy/ conduit diversion 09/2021 for pT2N0 bladder and pT2N0 prostate cancer with negative margins.   2 - Renal Insufficiency / Atrophic Left Kidney - severe left renal atrophy on imaging x many with Cr baseline 3's. 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  3 - Chronic, Non-Obstructive Right Hydronephrosis - some chronic Rt hydro that waxes / wanes. Worse on CT 10/2023 as some mass effect form dilated bowel (now treated). Previous diagnostic ureteroscopy 2024 w/o high grade obstruction.  4 - Sepsis / Small Bowel Obstruction - frank sepsis during admission 10/2023. Ex-lap / bowel resection with several feet of dead bowel 2023-11-23. BCX Klebsiella / pending placed on cefepime.  Today "Elijha" is stable but remains critically ill. Renal function continues to improve and he has been weaned from vasopressors. He is minimally responsive to stimulus, but not yet verbal.   Objective: Vital signs in last 24 hours: Temp:  [98.1 F (36.7 C)-99.5 F (37.5 C)] 98.9 F (37.2 C) (02/28 0835) Pulse Rate:  [56-102] 60 (02/28 1055) Resp:  [11-26] 17 (02/28 1055) BP: (101-148)/(37-70) 130/54 (02/28 1055) SpO2:  [90 %-98 %] 97 % (02/28 1055) Weight:  [70.8 kg] 70.8 kg (02/28 0500)    Intake/Output from previous day: 02/27 0701 - 02/28 0700 In: 1046.6 [I.V.:996.6; IV Piggyback:50] Out: 2635 [Urine:2500; Emesis/NG output:100; Drains:35] Intake/Output this shift: Total I/O In: 853.4 [I.V.:753.4; IV Piggyback:100] Out: 373 [Urine:350; Drains:23]  EXAM: Sedated on vernt GCS 3 Non-coarse breath sounds on vent. NGT in place HR low RRR to mildly bradycardic Midline surgical dressig c/d/I. RLQ Urostomy wih copious non-foul urine. LLQ JP with minimal non-foul  serosanguinous output Urostomy draining clear yellow urine.    Lab Results:  Recent Labs    11/07/23 0516 11/08/23 0535  WBC 13.1* 9.6  HGB 9.4* 9.1*  HCT 29.0* 28.8*  PLT 49* 53*   BMET Recent Labs    11/07/23 0516 11/08/23 0535  NA 142 145  K 3.7 3.6  CL 110 114*  CO2 23 25  GLUCOSE 95 120*  BUN 57* 49*  CREATININE 3.75* 2.63*  CALCIUM 7.1* 7.6*   PT/INR Recent Labs    23-Nov-2023 1628 11/06/23 0314  LABPROT 19.7* 21.0*  INR 1.6* 1.8*   ABG Recent Labs    Nov 23, 2023 1839 11/07/23 0845  PHART 7.3* 7.48*  HCO3 15.7* 23.8    Studies/Results: No results found.   Anti-infectives: Anti-infectives (From admission, onward)    Start     Dose/Rate Route Frequency Ordered Stop   11/07/23 0500  cefTRIAXone (ROCEPHIN) 2 g in sodium chloride 0.9 % 100 mL IVPB        2 g 200 mL/hr over 30 Minutes Intravenous Every 24 hours 11/06/23 1046     11/06/23 0400  ceFEPIme (MAXIPIME) 1 g in sodium chloride 0.9 % 100 mL IVPB  Status:  Discontinued        1 g 200 mL/hr over 30 Minutes Intravenous Every 24 hours Nov 23, 2023 0456 11-23-2023 1419   11/06/23 0400  ceFEPIme (MAXIPIME) 2 g in sodium chloride 0.9 % 100 mL IVPB  Status:  Discontinued        2 g 200 mL/hr over 30 Minutes Intravenous Every 24 hours 11/23/23 1419 11/06/23 1046   11/23/2023 1200  metroNIDAZOLE (FLAGYL) IVPB 500 mg  Status:  Discontinued        500 mg 100 mL/hr over 60 Minutes Intravenous Every 12 hours 11/05/23 1106 11/06/23 1046   11/05/23 0345  ceFEPIme (MAXIPIME) 2 g in sodium chloride 0.9 % 100 mL IVPB        2 g 200 mL/hr over 30 Minutes Intravenous  Once 11/05/23 0331 11/05/23 0424       Assessment/Plan: #s/p ex lap w/ necrotic bowel resection #ligated left ureter #Ileal conduit #septic shock  Long conversation with his son reviewing what has happened surgically, the natural hx of septic shock and pre-renal injury, and impact on Lennart going forward. All questions were answered to his  satisfaction.   Conduit draining clear yellow urine with renal indicis improving daily. Preoperative mass effect of dilated bowel on ileal conduit. This should be decompressed, confirmed by UOP of 2.5L in solitary kidney and improving SCr. Left ureter stricture unable to be salvaged and was ligated. From a urologic perspective, as his left kidney was essentially non-functional, this should have little to do with his long term renal recovery.   Will follow peripherally at this stage. Please call with questions or concerns.   Scherrie Bateman Keoni Havey 11/08/2023

## 2023-11-08 NOTE — Progress Notes (Signed)
 3 Days Post-Op   Subjective/Chief Complaint: More awake and interactive today. Still confused   Objective: Vital signs in last 24 hours: Temp:  [98.1 F (36.7 C)-99.5 F (37.5 C)] 98.9 F (37.2 C) (02/28 0835) Pulse Rate:  [56-102] 61 (02/28 0800) Resp:  [11-26] 19 (02/28 0800) BP: (101-148)/(37-70) 121/54 (02/28 0800) SpO2:  [90 %-98 %] 90 % (02/28 0800) Arterial Line BP: (153)/(69) 153/69 (02/27 1000) Weight:  [70.8 kg] 70.8 kg (02/28 0500)    Intake/Output from previous day: 02/27 0701 - 02/28 0700 In: 1046.6 [I.V.:996.6; IV Piggyback:50] Out: 2635 [Urine:2500; Emesis/NG output:100; Drains:35] Intake/Output this shift: Total I/O In: 853.4 [I.V.:753.4; IV Piggyback:100] Out: 373 [Urine:350; Drains:23]  General appearance: alert and cooperative Resp: clear to auscultation bilaterally and off vent Cardio: regular rate and rhythm and off pressors GI: soft, wound clean  Lab Results:  Recent Labs    11/07/23 0516 11/08/23 0535  WBC 13.1* 9.6  HGB 9.4* 9.1*  HCT 29.0* 28.8*  PLT 49* 53*   BMET Recent Labs    11/07/23 0516 11/08/23 0535  NA 142 145  K 3.7 3.6  CL 110 114*  CO2 23 25  GLUCOSE 95 120*  BUN 57* 49*  CREATININE 3.75* 2.63*  CALCIUM 7.1* 7.6*   PT/INR Recent Labs    11/05/23 1628 11/06/23 0314  LABPROT 19.7* 21.0*  INR 1.6* 1.8*   ABG Recent Labs    11/05/23 1839 11/07/23 0845  PHART 7.3* 7.48*  HCO3 15.7* 23.8    Studies/Results: No results found.  Anti-infectives: Anti-infectives (From admission, onward)    Start     Dose/Rate Route Frequency Ordered Stop   11/07/23 0500  cefTRIAXone (ROCEPHIN) 2 g in sodium chloride 0.9 % 100 mL IVPB        2 g 200 mL/hr over 30 Minutes Intravenous Every 24 hours 11/06/23 1046     11/06/23 0400  ceFEPIme (MAXIPIME) 1 g in sodium chloride 0.9 % 100 mL IVPB  Status:  Discontinued        1 g 200 mL/hr over 30 Minutes Intravenous Every 24 hours 11/05/23 0456 11/05/23 1419   11/06/23 0400   ceFEPIme (MAXIPIME) 2 g in sodium chloride 0.9 % 100 mL IVPB  Status:  Discontinued        2 g 200 mL/hr over 30 Minutes Intravenous Every 24 hours 11/05/23 1419 11/06/23 1046   11/05/23 1200  metroNIDAZOLE (FLAGYL) IVPB 500 mg  Status:  Discontinued        500 mg 100 mL/hr over 60 Minutes Intravenous Every 12 hours 11/05/23 1106 11/06/23 1046   11/05/23 0345  ceFEPIme (MAXIPIME) 2 g in sodium chloride 0.9 % 100 mL IVPB        2 g 200 mL/hr over 30 Minutes Intravenous  Once 11/05/23 0331 11/05/23 0424       Assessment/Plan: s/p Procedure(s): EXPLORATORY LAPAROTOMY; lysis of adhesions; small bowel resection; ligation of left ureter; primary hernia repair (N/A) Continue ng.   Ok to start trickle tube feeds at this point Continue abx SBO 83 y/o M w/ history of bladder cancer, cystoprostatectomy and ileal conduit, ex lap for SBO in 2023 who presented with acute abdominal pain and vomiting. CT significant for SBO in RLQ. He was in septic shock in the ED and was taken emergently to the operating room where a closed loop SBO was found.  S/P EXPLORATORY LAPAROTOMY; lysis of adhesions; small bowel resection; ligation of left ureter; primary hernia repair 2/25 Dr. Carolynne Edouard (with Dr.  Bell, Dr. Berneice Heinrich)  -  POD#3, afebrile, WBC 9.8 from 13, hgb stable - continue NG to LIWS and await bowel function - twice daily moist-to-dry dressing changes to midline abdominal wound. Consider VAC placement Friday.  - continue JP drain, it is sitting in the pelvis where the left ureter was ligated. If drain output increases consider urine leak and check drain creatinine  - appreciate CCM mgmt of septic shock. Hemodynamics and renal function improving  - IV tylenol, lidoderm patch for pain. Avoid NSAIDs/IV robaxin given AKI/CKD. Will speak to CCM but would like to start some IV narcotic meds as he seems very uncomfortable this morning. Delirium precautions as able     FEN: NPO, IVF per primary, NG to LIWS ID: cefepime,  flagyl Foley: none - urostomy productive VTE: daily lovenox  LOS: 4 days    Benjamin Hensley 11/08/2023

## 2023-11-08 NOTE — Evaluation (Signed)
 Physical Therapy Evaluation Patient Details Name: Benjamin Hensley MRN: [id number] DOB: 01/30/1941 Today's Date: 11/08/2023  History of Present Illness  83 year old man who presented to [facility] ED 2/24 for abdominal pain. 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  Clinical Impression  Pt admitted with above diagnosis.  Pt currently with functional limitations due to the deficits listed below (see PT Problem List). Pt will benefit from acute skilled PT to increase their independence and safety with mobility to allow discharge.       The patient is asking for water frequently. Patient is unable to provide history, oriented to [facility]. Patient did  tolerate sitting on bed edge and stood x 1 with 2 person assist. Knees are flexed and patient unable to take any steps.  Assisted patient back into bed. Patient will benefit from continued inpatient follow up therapy, <3 hours/day     If plan is discharge home, recommend the following: Two people to help with walking and/or transfers;Two people to help with bathing/dressing/bathroom   Can travel by private vehicle        Equipment Recommendations None recommended by PT  Recommendations for Other Services       Functional Status Assessment Patient has had a recent decline in their functional status and demonstrates the ability to make significant improvements in function in a reasonable and predictable amount of time.     Precautions / Restrictions Precautions Precautions: Fall Precaution/Restrictions Comments: R urostomy, JP drain on L, NG suction/coretrac Restrictions Weight Bearing Restrictions Per Provider Order: No      Mobility  Bed Mobility Overal bed mobility: Needs Assistance Bed Mobility: Supine to Sit, Sit to  Supine     Supine to sit: Max assist Sit to supine: +2 for physical assistance, +2 for safety/equipment, Max assist   General bed mobility comments: patient able to move legs to bed edge, max assist with trunk. Cues to move to side ling but patient fell back onto bed to his back, assisted legs onto bed    Transfers Overall transfer level: Needs assistance   Transfers: Sit to/from Stand Sit to Stand: +2 physical assistance, +2 safety/equipment, Max assist           General transfer comment: mod support to stand from bed arm hold, unable to step to the side, assisted to sit back down    Ambulation/Gait                  Stairs            Wheelchair Mobility     Tilt Bed    Modified Rankin (Stroke Patients Only)       Balance Overall balance assessment: Needs assistance Sitting-balance support: Bilateral upper extremity supported, Feet supported Sitting balance-Leahy Scale: Fair     Standing balance support: Bilateral upper extremity supported, During functional activity Standing balance-Leahy Scale: Poor Standing balance comment: 2 person support                             Pertinent Vitals/Pain Pain Assessment Breathing: occasional labored breathing, short period of hyperventilation Negative Vocalization: occasional moan/groan, low speech, negative/disapproving quality Facial Expression: sad, frightened, frown Body Language: tense, distressed pacing, fidgeting Consolability: distracted or reassured by voice/touch PAINAD Score: 5    Home Living Family/patient expects to be  discharged to:: Private residence Living Arrangements: Spouse/significant other Available Help at Discharge: Family;Available PRN/intermittently Type of Home: Apartment             Additional Comments: unsure, patient unable to provide information    Prior Function Prior Level of Function : Independent/Modified Independent             Mobility  Comments: states he drives? ADLs Comments: independent ?     Extremity/Trunk Assessment   Upper Extremity Assessment Upper Extremity Assessment: Generalized weakness    Lower Extremity Assessment Lower Extremity Assessment: Generalized weakness       Communication   Communication Communication: No apparent difficulties Factors Affecting Communication: Difficulty expressing self    Cognition Arousal: Lethargic Behavior During Therapy: Restless   PT - Cognitive impairments: No family/caregiver present to determine baseline, Orientation, Awareness, Memory, Safety/Judgement                       PT - Cognition Comments: oriented to self and WL, asking for water Following commands: Impaired Following commands impaired: Follows one step commands inconsistently     Cueing Cueing Techniques: Verbal cues, Gestural cues     General Comments      Exercises     Assessment/Plan    PT Assessment Patient needs continued PT services  PT Problem List Decreased strength;Decreased mobility;Decreased safety awareness;Decreased range of motion;Decreased knowledge of precautions;Decreased cognition;Decreased activity tolerance;Decreased knowledge of use of DME       PT Treatment Interventions DME instruction;Therapeutic activities;Cognitive remediation;Gait training;Therapeutic exercise;Patient/family education;Functional mobility training    PT Goals (Current goals can be found in the Care Plan section)  Acute Rehab PT Goals PT Goal Formulation: Patient unable to participate in goal setting Time For Goal Achievement: 11/22/23 Potential to Achieve Goals: Fair    Frequency Min 1X/week     Co-evaluation               AM-PAC PT "6 Clicks" Mobility  Outcome Measure Help needed turning from your back to your side while in a flat bed without using bedrails?: A Lot Help needed moving from lying on your back to sitting on the side of a flat bed without using  bedrails?: A Lot Help needed moving to and from a bed to a chair (including a wheelchair)?: A Lot Help needed standing up from a chair using your arms (e.g., wheelchair or bedside chair)?: A Lot Help needed to walk in hospital room?: Total Help needed climbing 3-5 steps with a railing? : Total 6 Click Score: 10    End of Session Equipment Utilized During Treatment: Oxygen Activity Tolerance: Patient tolerated treatment well Patient left: in bed;with bed alarm set;with call bell/phone within reach;with nursing/sitter in room Nurse Communication: Mobility status PT Visit Diagnosis: Unsteadiness on feet (R26.81);Muscle weakness (generalized) (M62.81);Difficulty in walking, not elsewhere classified (R26.2)    Time: 4098-1191 PT Time Calculation (min) (ACUTE ONLY): 23 min   Charges:   PT Evaluation $PT Eval Low Complexity: 1 Low PT Treatments $Therapeutic Activity: 8-22 mins PT General Charges $$ ACUTE PT VISIT: 1 Visit         Blanchard Kelch PT Acute Rehabilitation Services Office [phone]    Rada Hay 11/08/2023, 1:48 PM

## 2023-11-08 NOTE — TOC Progression Note (Signed)
 Transition of Care [facility]) - Progression Note    Patient Details  Name: Benjamin Hensley MRN: [id number] Date of Birth: 1940-11-13  Transition of Care [facility]) CM/SW Contact  Adrian Prows, RN Phone Number: 11/08/2023, 3:58 PM  Clinical Narrative:    PT recc SNF; LVM for pt's wife May Alcala at [phone] to discuss; also LVM for Laney Potash at [facility] [phone], ext [phone]); awaiting return calls.     Barriers to Discharge: Continued Medical Work up  Expected Discharge Plan and Services       Living arrangements for the past 2 months: Single Family Home                                       Social Determinants of Health (SDOH) Interventions SDOH Screenings   Food Insecurity: Patient Unable To Answer (11/06/2023)  Transportation Needs: Patient Unable To Answer (11/06/2023)  Utilities: Patient Unable To Answer (11/06/2023)  Social Connections: Patient Unable To Answer (11/06/2023)  Tobacco Use: Medium Risk (11/04/2023)    Readmission Risk Interventions    11/06/2023    9:31 AM  Readmission Risk Prevention Plan  Transportation Screening Complete  PCP or Specialist Appt within 3-5 Days Complete  HRI or Home Care Consult Complete  Social Work Consult for Recovery Care Planning/Counseling Complete  Palliative Care Screening Not Applicable  Medication Review [employer]) Complete

## 2023-11-08 NOTE — Progress Notes (Signed)
 NAME:  Benjamin Hensley, MRN:  [id number], DOB:  1941/01/24, LOS: 4 ADMISSION DATE:  11/04/2023 CONSULTATION DATE:  11/05/2023 REFERRING MD:  Jonathon Bellows - [facility] CHIEF COMPLAINT:  Abdominal pain, concern for developing urosepsis   History of Present Illness:  83 year old man who presented to [facility] ED 2/24 for abdominal pain. PMHx significant for HLD, SBO (s/p ex-lap/SBR 02/2022), aggressive bladder/prostate CA requiring ileal conduit (followed by Urology - Dr. 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  Patient initially presented to [facility] 2/24 for sudden onset of abdominal pain around 1200 with nausea, vomiting and chills. EMS was called and brought patient to ED. While in ED, patient was noted to be febrile to Tmax 103.43F, tachycardic to 100s, tachypneic to 20s-low 30s, BP 150s/80s. Labs were notable for WBC 8.3, Hgb 14.1, Plt 146. Na 131, K 4.3, CO2 21, Cr 2.71 (baseline variable), LFTs WNL. Trop unremarkable. UA with small Hgb, 100 protein, large leuks, rare bacteria. Blood Cx and Urine Cx pending. Empiric broad-spectrum antibiotics (cefepime/Flagyl) started.   On 2/25AM while awaiting bed placement, patient was noted to be persistently febrile to 101.35F with stable WBC but rising LA to 7.1 and Cr nearly doubled 4.45 (2.71). Hemodynamically, patient became borderline hypotensive.  PCCM consulted for possible ICU management.  Pertinent Medical History:   Past Medical History:  Diagnosis Date   Bladder cancer (HCC)    Chronic kidney disease    Dementia (HCC)    History of kidney stones    Medical history non-contributory    Meningitis    hx of as a child affected right eye   Significant Hospital Events: Including procedures, antibiotic start and stop dates in addition to other pertinent events   2/24 - Presented to [facility] with abdominal pain, n/v. Extensive urologic history. Febrile, WBC  WNL, normotensive with plan for floor admission. 2/25 - Persistently febrile with soft BP, rising LA to 7.1. PCCM consulted. Taken to OR for ex-lap; found to have closed-loop SBO with necrosis (resected) 2/2 tight band (nonfunctional L ureter) which was ligated. Taken to ICU postoperatively on 2 vasopressors and intubated. LIJ CVC placed for access. 2/26 - Sedation weaned, extubated. 2/27 - POD#2 from ex-lap/LOA and L ureteral ligation. No significant events overnight, low grade temp 99.35F. Remains minimally verbal, awake/will intermittently follow commands, appears uncomfortable.. ABG check to r/o hypercarbia/acidosis as contributing factors; mildly alkalotic, gas otherwise WNL. Bicarb gtt discontinued. LA repeated. Off of Levophed. Urostomy with increased mucous/purulent material/ Plt 49, held Lovenox  Interim History / Subjective:   2/28: More awake and much improved confusion but still with hypoactive delrium and in restraints.  Rn feels patient can transfer out of ICU and might be beter for him in progressive with less alarms. 2L [place] +. Afebrile x 3 dys. Off prressrs. Stil with R internal jugular. AKI improved and creat down to 2.6 . On d5LR. Per RN CCS planning trickle feeds today   Objective:  Blood pressure (!) 120/47, pulse 70, temperature 98.2 F (36.8 C), temperature source Oral, resp. rate 16, height 5\' 11"  (1.803 m), weight 70.8 kg, SpO2 98%.        Intake/Output Summary (Last 24 hours) at 11/08/2023 0754 Last data filed at 11/08/2023 0534 Gross per 24 hour  Intake 1046.63 ml  Output 2635 ml  Net -1588.37 ml   Filed Weights   11/06/23 0704 11/07/23 0500 11/08/23 0500  Weight: 95.3 kg 83.3 kg 70.8 kg  Physical Examination: s.  .General Appearance:  Looks chronic critically ill Head:  Normocephalic, without obvious abnormality, atraumatic Eyes:  PERRL - yes, conjunctiva/corneas - muddy     Ears:  Normal external ear canals, both ears Nose:  G tube - NG TUBE + Throat:  ETT  TUBE - NO , OG tube - NO Neck:  Supple,  No enlargement/tenderness/nodules Lungs: Clear to auscultation bilaterally,  Heart:  S1 and S2 normal, no murmur, CVP - NO.  Pressors - NO Abdomen:  Soft, no masses, no organomegaly. Midline abdominal wound with WTD dressing in place (fascia closed, skin open). Hypoactive bowel sounds. Urostomy/ileal conduit with increased mucopurulent drainage in bag, urine clear yellow. JP drain with scant serosanguinous output. TENDER RANDOMLY Genitalia / Rectal:  Not done Extremities:  Extremities- intact Skin:  ntact in exposed areas . Sacral area - NOT EXAMINEd Neurologic:  Sedation - none -> RASS - 0 with restuairs . Moves all 4s - yes. CAM-ICU - POSITIVE dELIRIUm . Orientation - NOT       Resolved Hospital Problem List:    Assessment & Plan:  Undifferentiated shock, presumed septic versus hypovolemic in the setting of GI losses with SBO Klebsiella pneumoniae bacteremia 2/2 Klebsiella UTI   11/08/2023: Afebrile x 3 days.  Off pressors for greater than 24 hours.  Continues on D5 LR at 75 mL/h.  Plan - Goal MAP > 65 - Continue antibiotics: total  7 days - 10 days; set stop date with CCS  -  Anti-infectives (From admission, onward)    Start     Dose/Rate Route Frequency Ordered Stop   11/07/23 0500  cefTRIAXone (ROCEPHIN) 2 g in sodium chloride 0.9 % 100 mL IVPB        2 g 200 mL/hr over 30 Minutes Intravenous Every 24 hours 11/06/23 1046     11/06/23 0400  ceFEPIme (MAXIPIME) 1 g in sodium chloride 0.9 % 100 mL IVPB  Status:  Discontinued        1 g 200 mL/hr over 30 Minutes Intravenous Every 24 hours 11/05/23 0456 11/05/23 1419   11/06/23 0400  ceFEPIme (MAXIPIME) 2 g in sodium chloride 0.9 % 100 mL IVPB  Status:  Discontinued        2 g 200 mL/hr over 30 Minutes Intravenous Every 24 hours 11/05/23 1419 11/06/23 1046   11/05/23 1200  metroNIDAZOLE (FLAGYL) IVPB 500 mg  Status:  Discontinued        500 mg 100 mL/hr over 60 Minutes Intravenous  Every 12 hours 11/05/23 1106 11/06/23 1046   11/05/23 0345  ceFEPIme (MAXIPIME) 2 g in sodium chloride 0.9 % 100 mL IVPB        2 g 200 mL/hr over 30 Minutes Intravenous  Once 11/05/23 0331 11/05/23 0424        SBO, s/p ex-lap/LOA and SBR 2/25 Nausea with vomiting History of SBO (s/p ex-lap/SBR 02/2022).  2/28: - POD#3 from ex-lap/LOA/SBR. Still with non specifi tenderness  PLAN - - Postoperative management per CCS - Multimodal pain control ( - IV Fentanyl, APAP, Lidoderm); consider PO oxycodone when able - Fascia closed, midline wound with skin open/WTD dressings in place - Possible WV placement 2/28, JP remains in place - NGT to LIWS, await ROBF  - - D5LR fluids 2/27 for some caloric intake/borderline glucoses while awaiting ability to use gut; AT 40ml?h   Acute encephalopathy, presumed multifactorial in the setting of sepsis, ARF, delirium, mild baseline dementia  2/28: Slowly improving but still with hypoactive  delirium. No focal devidcits  PLAN - Treat underlying issues (sepsis) - Correct metabolic derangements - Restraitns - Limit sedating medications as able, suspect taking longer to clear sedation due to poor renal function - Avoid interruptions to normal sleep/wake cycle as able  - move to progressive  - No focal neurologic deficits; low threshold for CT Head if mental status changes  Acute hypoxemic respiratory failure in the setting of sepsis, resolved  2/28 - on 2L [place]  PLAN - Supplemental O2 support as needed for SpO2 > 90% - Pulmonary hygiene - Encourage OOB/mobility when cleared by surgery  AKI on CKD stage IV Recurrent pyelonephritis/bacteruria post-cystectomy Chronic hydronephrotic L kidney, s/p L ureter ligation 2/25 - S/p ligation of nonfunctional L ureter 2/25 Low grade R ureteral stricture Aggressive bladder/prostate CA requiring ileal conduit (followed by Urology - Dr. Berneice Heinrich)  2/28 L AKI better  PLAN  - Urology following, appreciate  recommendations - Ceftriaxone as above - Trend BMP - Replete electrolytes as indicated - Monitor I&Os - Avoid nephrotoxic agents as able - Ensure adequate renal perfusion; D5 at 50cc/h   Thrombocytopenia - likely due to sepsis  2/28:  improving  Plan  - moniitor  -  heparin SQ when appropriate   Low K Low Phos  Plan  - replete  HLD - HOLD statin  Dementia - Resume Zoloft when able  Best Practice: (right click and "Reselect all SmartList Selections" daily)   Diet/type: NPO ->CCS managing DVT prophylaxis: SCDs GI prophylaxis: PPI Lines: A LINE OFF, DC CVL 11/08/23 Foley:  N/A - Urostomy in place with ileal conduit Code Status:  full code Last date of multidisciplinary goals of care discussion [2/26 - Family (son, Benjamin Hensley) updated via phone]  2/28 - Son Benjamin Hensley updated over phone. He wil be by bedside later  DISPO - move to progressive. tRH Primary from 11/09/23 and CCM off - d/w Dr Thermon Leyland    Dr. Kalman Shan, M.D., F.C.C.P,  Pulmonary and Critical Care Medicine Staff Physician, [facility] Director - Interstitial Lung Disease  Program  Pulmonary Fibrosis [facility] [place], [place], [address]   Pager: [phone], If no answer  -> Check AMION or Try [phone] Telephone (clinical office): [phone] Telephone (research): [phone]  7:54 AM 11/08/2023     LABS    PULMONARY Recent Labs  Lab 11/05/23 1420 11/05/23 1501 11/05/23 1839 11/07/23 0845  PHART 7.153* 7.172* 7.3* 7.48*  PCO2ART 46.0 47.2 32 32  PO2ART 197* 131* 203* 83  HCO3 15.9* 17.3* 15.7* 23.8  TCO2 17* 19*  --   --   O2SAT 99 98 98.7 99.2    CBC Recent Labs  Lab 11/06/23 0314 11/07/23 0516 11/08/23 0535  HGB 10.4* 9.4* 9.1*  HCT 31.9* 29.0* 28.8*  WBC 29.8* 13.1* 9.6  PLT 75* 49* 53*    COAGULATION Recent Labs  Lab 11/05/23 1628 11/06/23 0314  INR 1.6* 1.8*    CARDIAC  No results  for input(s): "TROPONINI" in the last 168 hours. No results for input(s): "PROBNP" in the last 168 hours.   CHEMISTRY Recent Labs  Lab 11/05/23 0537 11/05/23 1420 11/05/23 1501 11/05/23 1628 11/06/23 0314 11/07/23 0516 11/08/23 0535  NA 136   < > 141 139 136 142 145  K 4.7   < > 4.1 4.8 4.2 3.7 3.6  CL 103  --   --  114* 105 110 114*  CO2 19*  --   --  15* 20* 23 25  GLUCOSE 172*  --   --  112* 156* 95 120*  BUN 54*  --   --  61* 59* 57* 49*  CREATININE 4.45*  --   --  4.85* 3.66* 3.75* 2.63*  CALCIUM 9.0  --   --  7.7* 7.2* 7.1* 7.6*  MG 1.9  --   --   --  1.7 1.8 2.0  PHOS 3.0  --   --   --  4.1 3.6 2.2*   < > = values in this interval not displayed.   Estimated Creatinine Clearance: 22.4 mL/min (A) (by C-G formula based on SCr of 2.63 mg/dL (H)).   LIVER Recent Labs  Lab 11/04/23 1931 11/05/23 1628 11/06/23 0314 11/08/23 0535  AST 21  --  27 27  ALT 19  --  16 20  ALKPHOS 58  --  39 69  BILITOT 0.8  --  0.8 0.5  PROT 7.3  --  5.3* 5.0*  ALBUMIN 3.7  --  2.6* 2.1*  INR  --  1.6* 1.8*  --      INFECTIOUS Recent Labs  Lab 11/05/23 1015 11/05/23 1628 11/07/23 0824  LATICACIDVEN 7.1* 3.0* 1.3     ENDOCRINE CBG (last 3)  Recent Labs    11/07/23 1942 11/07/23 2345 11/08/23 0405  GLUCAP 93 100* 106*         IMAGING x48h  - image(s) personally visualized  -   highlighted in bold No results found.

## 2023-11-08 NOTE — Progress Notes (Signed)
 Patient has transferred to [facility] Progressive Care Unit 4W Room 1444 per transfer order. Vital signs obtained. Patient's oxygen saturation level is 93% on room air. Patient has no signs of acute respiratory distress. Order for continuous pulse oximetry is being discontinued. Approved via secure chat with attending MD.

## 2023-11-08 NOTE — Progress Notes (Addendum)
 [facility] Progress Note  Name: Benjamin Hensley MRN: [id number] DOB: Jan 03, 1941  Chief Complaint:  N/v/fever   Subjective:  He had 2.5 liters UOP over 2/27.  He was just transferred to the floor.  He has been speaking more today.  He provides little history.  States he sees Dr. Berneice Heinrich.    Review of systems:    Limited by patient factors Denies shortness of breath  Denies n/v Has nasal tube in place   --------------- Background on consult:  Benjamin Hensley is a 83 y.o. male with a history of CKD stage IV, obstructive uropathy and bladder/prostate cancer presented to the hospital with nausea and vomiting as well as fever.  He has a bladder conduit in place for known bilateral hydronephrosis.  He was found to have a small bowel obstruction.  Note that the bowel obstruction may also involve the ileal conduit per imaging.  Surgery and urology were consulted.  Nephrology is consulted for assistance with management of AKI. Earlier today he underwent ex-lap with ligation of left distal ureter and repair of ileal conduit.  Also noted patient with small bowel resection with surgery today in combined case.  He has been ordered for a bicarb gtt - pharmacy is sending this up.  He has been transferred to the ICU.  He is intubated and on levophed at 10 mcg/min and phenylephrine at 70 mcg/min.  Transitioning to levo per RN report.  He follows with Dr. Berneice Heinrich in urology.  I spoke with his wife via phone.  He does not have a nephrologist.  Per his wife the patient has no history of dementia though this is charted.           Intake/Output Summary (Last 24 hours) at 11/08/2023 1640 Last data filed at 11/08/2023 1627 Gross per 24 hour  Intake 1876.48 ml  Output 3008 ml  Net -1131.52 ml    Vitals:  Vitals:   11/08/23 1100 11/08/23 1200 11/08/23 1208 11/08/23 1400  BP:    (!) 128/40  Pulse: (!) 58 61  71  Resp: (!) 21 (!) 23  19  Temp:   99.6 F (37.6 C)   TempSrc:   Axillary   SpO2:  98% 94%  96%  Weight:      Height:         Physical Exam:    General elderly male in bed in no acute distress HEENT normocephalic atraumatic extraocular movements intact sclera anicteric Neck supple trachea midline Lungs clear to auscultation bilaterally anteriorly normal work of breathing at rest on room air Heart S1S2, no rub Abdomen soft; midline incision dressed; urostomy in place Extremities no pitting edema  Psych no anxiety or agitation  Neuro - limited speech.  He provides first name, location of "Benjamin Hensley" and cannot provide the year GU foley in place as well as urostomy   Medications reviewed   Labs:     Latest Ref Rng & Units 11/08/2023    5:35 AM 11/07/2023    5:16 AM 11/06/2023    3:14 AM  BMP  Glucose 70 - 99 mg/dL 782  95  956   BUN 8 - 23 mg/dL 49  57  59   Creatinine 0.61 - 1.24 mg/dL 2.13  0.86  5.78   Sodium 135 - 145 mmol/L 145  142  136   Potassium 3.5 - 5.1 mmol/L 3.6  3.7  4.2   Chloride 98 - 111 mmol/L 114  110  105   CO2  22 - 32 mmol/L 25  23  20    Calcium 8.9 - 10.3 mg/dL 7.6  7.1  7.2      Assessment/Plan:   # AKI - secondary to ischemic and prerenal insults with small bowel obstruction  - he has improved to his last known baseline with supportive measures - nonoliguric    # Small bowel obstruction - s/p emergent ex-lap on 2/25 - Per surgery and critical care   # 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  I will request follow-up with [facility] here in  [place] for two weeks after discharge.  I spoke with his son [name] to update him as well.  (Patient's wife is recovering from surgery earlier this week herself)  Estanislado Emms, MD 11/08/2023 5:28 PM   Nephrology will sign off.  AKI is resolving to near his baseline which is CKD stage IV.  Please do not hesitate to contact me with any questions regarding our patient.  Follow-up request was submitted  Estanislado Emms, MD 5:29 PM 11/08/2023

## 2023-11-08 NOTE — Progress Notes (Signed)
 Patient is repeatedly asking for water. Present diet is NPO. Via secure chat made request to swab patient's mouth. Attending MD approved request.

## 2023-11-08 NOTE — Plan of Care (Signed)
  Problem: Fluid Volume: Goal: Ability to maintain a balanced intake and output will improve Outcome: Progressing   Problem: Metabolic: Goal: Ability to maintain appropriate glucose levels will improve Outcome: Progressing   Problem: Skin Integrity: Goal: Risk for impaired skin integrity will decrease Outcome: Progressing   Problem: Tissue Perfusion: Goal: Adequacy of tissue perfusion will improve Outcome: Progressing   Problem: Coping: Goal: Level of anxiety will decrease Outcome: Progressing   Problem: Pain Managment: Goal: General experience of comfort will improve and/or be controlled Outcome: Progressing

## 2023-11-08 NOTE — Progress Notes (Signed)
 eLink Physician-Brief Progress Note Patient Name: Benjamin Hensley DOB: 03-14-1941 MRN: [id number]   Date of Service  11/08/2023  HPI/Events of Note    eICU Interventions      Low phos Will replace    Massie Maroon 11/08/2023, 6:54 AM

## 2023-11-09 ENCOUNTER — Inpatient Hospital Stay (HOSPITAL_COMMUNITY)

## 2023-11-09 DIAGNOSIS — R1084 Generalized abdominal pain: Secondary | ICD-10-CM | POA: Diagnosis not present

## 2023-11-09 LAB — PROTIME-INR
INR: 1.1 (ref 0.8–1.2)
Prothrombin Time: 14.7 s (ref 11.4–15.2)

## 2023-11-09 LAB — COMPREHENSIVE METABOLIC PANEL
ALT: 45 U/L — ABNORMAL HIGH (ref 0–44)
AST: 81 U/L — ABNORMAL HIGH (ref 15–41)
Albumin: 2.4 g/dL — ABNORMAL LOW (ref 3.5–5.0)
Alkaline Phosphatase: 256 U/L — ABNORMAL HIGH (ref 38–126)
Anion gap: 10 (ref 5–15)
BUN: 51 mg/dL — ABNORMAL HIGH (ref 8–23)
CO2: 22 mmol/L (ref 22–32)
Calcium: 8.1 mg/dL — ABNORMAL LOW (ref 8.9–10.3)
Chloride: 116 mmol/L — ABNORMAL HIGH (ref 98–111)
Creatinine, Ser: 2.55 mg/dL — ABNORMAL HIGH (ref 0.61–1.24)
GFR, Estimated: 25 mL/min — ABNORMAL LOW (ref >60)
Glucose, Bld: 117 mg/dL — ABNORMAL HIGH (ref 70–99)
Potassium: 3.8 mmol/L (ref 3.5–5.1)
Sodium: 148 mmol/L — ABNORMAL HIGH (ref 135–145)
Total Bilirubin: 0.5 mg/dL (ref 0.0–1.2)
Total Protein: 5.5 g/dL — ABNORMAL LOW (ref 6.5–8.1)

## 2023-11-09 LAB — CBC
HCT: 30.2 % — ABNORMAL LOW (ref 39.0–52.0)
Hemoglobin: 9.4 g/dL — ABNORMAL LOW (ref 13.0–17.0)
MCH: 28 pg (ref 26.0–34.0)
MCHC: 31.1 g/dL (ref 30.0–36.0)
MCV: 89.9 fL (ref 80.0–100.0)
Platelets: 68 10*3/uL — ABNORMAL LOW (ref 150–400)
RBC: 3.36 MIL/uL — ABNORMAL LOW (ref 4.22–5.81)
RDW: 14.2 % (ref 11.5–15.5)
WBC: 8 10*3/uL (ref 4.0–10.5)
nRBC: 0 % (ref 0.0–0.2)

## 2023-11-09 LAB — TYPE AND SCREEN
ABO/RH(D): O POS
Antibody Screen: NEGATIVE
Unit division: 0
Unit division: 0

## 2023-11-09 LAB — PHOSPHORUS
Phosphorus: 2.4 mg/dL — ABNORMAL LOW (ref 2.5–4.6)
Phosphorus: 2.6 mg/dL (ref 2.5–4.6)

## 2023-11-09 LAB — GLUCOSE, CAPILLARY
Glucose-Capillary: 104 mg/dL — ABNORMAL HIGH (ref 70–99)
Glucose-Capillary: 116 mg/dL — ABNORMAL HIGH (ref 70–99)
Glucose-Capillary: 118 mg/dL — ABNORMAL HIGH (ref 70–99)
Glucose-Capillary: 126 mg/dL — ABNORMAL HIGH (ref 70–99)
Glucose-Capillary: 130 mg/dL — ABNORMAL HIGH (ref 70–99)
Glucose-Capillary: 131 mg/dL — ABNORMAL HIGH (ref 70–99)

## 2023-11-09 LAB — BPAM RBC
Blood Product Expiration Date: 202503252359
Blood Product Expiration Date: 202503262359
Unit Type and Rh: 5100
Unit Type and Rh: 5100

## 2023-11-09 LAB — MAGNESIUM
Magnesium: 2.3 mg/dL (ref 1.7–2.4)
Magnesium: 2.5 mg/dL — ABNORMAL HIGH (ref 1.7–2.4)

## 2023-11-09 MED ORDER — BISACODYL 10 MG RE SUPP
10.0000 mg | Freq: Every day | RECTAL | Status: DC | PRN
  Administered 2023-11-09: 10 mg via RECTAL
  Filled 2023-11-09: qty 1

## 2023-11-09 MED ORDER — PANTOPRAZOLE SODIUM 40 MG IV SOLR
40.0000 mg | Freq: Every day | INTRAVENOUS | Status: DC
  Administered 2023-11-10 – 2023-11-12 (×3): 40 mg via INTRAVENOUS
  Filled 2023-11-09 (×3): qty 10

## 2023-11-09 MED ORDER — NAPHAZOLINE-GLYCERIN 0.012-0.25 % OP SOLN
1.0000 [drp] | Freq: Four times a day (QID) | OPHTHALMIC | Status: DC | PRN
  Administered 2023-11-09 – 2023-11-10 (×2): 2 [drp] via OPHTHALMIC
  Administered 2023-11-13: 1 [drp] via OPHTHALMIC
  Filled 2023-11-09: qty 15

## 2023-11-09 MED ORDER — OSMOLITE 1.5 CAL PO LIQD
1000.0000 mL | ORAL | Status: DC
  Administered 2023-11-10: 1000 mL
  Filled 2023-11-09: qty 1000

## 2023-11-09 MED ORDER — PANTOPRAZOLE SODIUM 40 MG PO TBEC: 40.0000 mg | DELAYED_RELEASE_TABLET | Freq: Every day | ORAL | Status: DC

## 2023-11-09 MED ORDER — POTASSIUM PHOSPHATES 15 MMOLE/5ML IV SOLN
15.0000 mmol | Freq: Once | INTRAVENOUS | Status: AC
  Administered 2023-11-09: 15 mmol via INTRAVENOUS
  Filled 2023-11-09: qty 5

## 2023-11-09 NOTE — Progress Notes (Signed)
 Mobility Specialist - Progress Note   11/09/23 1325  Mobility  Activity Transferred from bed to chair  Level of Assistance +2 (takes two people)  Press photographer wheel walker  Activity Response Tolerated well  Mobility Referral No  Mobility visit 1 Mobility  Mobility Specialist Start Time (ACUTE ONLY) 1300  Mobility Specialist Stop Time (ACUTE ONLY) 1325  Mobility Specialist Time Calculation (min) (ACUTE ONLY) 25 min   Pt received in bed and agreeable to transfer to the recliner. Pt was maxA +2 from supine>sitting. ModA +2 from sit>stand. No complaints during session. Pt to recliner after session with all needs met.    [facility]

## 2023-11-09 NOTE — Progress Notes (Signed)
 PROGRESS NOTE    Benjamin Hensley  WUJ:[id number] DOB: 1941-09-02 DOA: 11/04/2023 PCP: Thana Ates, MD    Brief Narrative:   Benjamin Hensley is a 83 y.o. male with past medical history significant for SBO (s/p ex-lap/SBR 02/2022), aggressive bladder/prostate CA requiring ileal conduit (followed by Urology - Dr. 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  In the ED, Tmax 103.82F, tachycardic to 100s, tachypneic to 20s-low 30s, BP 150s/80s. Labs were notable for WBC 8.3, Hgb 14.1, Plt 146. Na 131, K 4.3, CO2 21, Cr 2.71 (baseline variable), LFTs WNL. Trop unremarkable. UA with small Hgb, 100 protein, large leuks, rare bacteria. Blood Cx and Urine Cx pending.  CT abdomen/pelvis with right lower quadrant ileal conduit that is decompressed with bilateral hydronephrosis/hydroureter, left renal atrophy, concern for small bowel obstruction with possible vascular compromise causing small bowel wall thickening appears to be localized in the right lower quadrant with concern of adhesions.  Empiric broad-spectrum antibiotics (cefepime/Flagyl) started.  EDP discussed with urology and general surgery will see in consultation.  Hospitalist service consulted for admission.  Significant Hospital events: 2/24: Admit to [facility], [facility], urology consulted; started on broad-spectrum antibiotics 2/25: Persistently febrile with soft BP, rising LA to 7.1. PCCM consulted. Taken to OR for ex-lap; found to have closed-loop SBO with necrosis (resected) 2/2 tight band (nonfunctional L ureter) which was ligated. Taken to ICU postoperatively on 2 vasopressors and intubated. LIJ CVC placed for access. 2/26: Sedation weaned, extubated. 2/27: POD#2 from ex-lap/LOA and L ureteral  ligation. Remains encephalopathic and uncomfortable. Bicarb gtt discontinued. Off of Levophed. Urostomy with increased mucous/purulent material/ Plt 49, held Lovenox 2/28: Encephalopathy improving, right IJ catheter removed, creatinine improved to 2.6.  Transferred to progressive unit 3/1: Transferred to [facility]; tube feeds increased to 30 mL/h; stable; downgrade to med/surg   Assessment & Plan:   Small bowel obstruction with necrosis Patient presenting to the ED with acute onset abdominal pain.  Temperature 103.5, tachycardic, tachypneic.  CT abdomen/pelvis notable for SBO with possible vascular compromise.  General surgery was consulted and patient underwent exploratory laparotomy with lysis of adhesions, small bowel resection, primary hernia repair on 11/05/2023 by Dr. Carolynne Edouard.  -- General Surgery following, appreciate assistance -- Ceftriaxone 2 g IV every 24 hours -- N.p.o., started on trickle tube feeds, up to 30 mL/h today -- Lidocaine patch -- Fentanyl 25-50 mg IV every 2 hours as needed moderate pain -- General Surgery plans to start wound VAC to abdominal wound -- Further per general surgery  Septic shock Klebsiella pneumonia UTI/bacteremia Urine culture and blood cultures positive for Klebsiella pneumonia with resistance to ampicillin. -- Continue ceftriaxone 2 g IV q24h  Acute metabolic encephalopathy: Improving Etiology likely secondary to critical illness, sepsis secondary to bowel obstruction with necrosis as above.  Postoperatively required vasopressor support, mechanical ventilation; which now has been discontinued.  -- Supportive care  Acute renal failure on CKD stage IV History of bladder/prostate cancer s/p ileal conduit History of recurrent pyelonephritis s/p cystectomy Chronic left hydronephrotic kidney Patient follows with urology outpatient, Dr. Berneice Heinrich.  Does not follow with nephrology outpatient.  Underwent cystoprostatectomy with conduit diversion initially  January  2023.  Patient underwent left ureter ligation and repair of ileal conduit by Dr. Alvester Morin on 11/05/2023.  -- Nephrology following, appreciate  assistance -- Cr 2.71>>4.85>>2.63>2.55 -- BMP daily  Hypokalemia Repleted, potassium 3.8 this morning. -- 15 mmol of K-Phos today -- Repeat electrolytes in a.m.  Hypophosphatemia Repleted, phosphorus 2.4 today. -- Repeat K-Phos 15 mmol today -- Repeat electrolytes in a.m.  Hyperlipidemia -- Hold home atorvastatin  Weakness/debility/deconditioning: -- PT/OT evaluation  DVT prophylaxis: SCDs Start: 11/05/23 1125    Code Status: Full Code Family Communication: No family present at bedside this morning  Disposition Plan:  Level of care: Med-Surg Status is: Inpatient Remains inpatient appropriate because: IV antibiotics, remains on tube feeds    Consultants:  Urology General surgery Nephrology PCCM -signed off 11/09/2023  Procedures:  Exploratory laparotomy, LOA, SBR, ligation left ureter, primary hernia repair; Dr. Carolynne Edouard, Dr. Fredricka Bonine, Dr. Alvester Morin, Dr. Berneice Heinrich; 11/05/2023  Antimicrobials:  Cefepime 2/24 - 2/25 Metronidazole 2/25 - 2/26 Ceftriaxone 2/26>>   Subjective: Patient seen examined bedside, lying in bed.  Has mitts to both hands in place for pulling at medical equipment including NG tube overnight per RN.  RN present at bedside.  Reports some abdominal tenderness surrounding his surgical sites.  Patient is alert and oriented this morning.  Remains on tube feeds, uptitrated to 30 mL/h per general surgery today.  Remains NPO.  Awaiting return of bowel function.  No family present at bedside this morning.  Denies headache, no chest pain, no palpitations, no shortness of breath, no fever/chills, no nausea/vomiting, no diarrhea.  No acute events overnight per nursing.  Appropriate for downgrade to medical surgical floor today  Objective: Vitals:   11/08/23 2023 11/08/23 2357 11/09/23 0413 11/09/23 0500  BP: (!) 140/69 130/66 122/77    Pulse: 87 85 70   Resp: (!) 21 19 18    Temp: 98.8 F (37.1 C) 97.9 F (36.6 C) 99.1 F (37.3 C)   TempSrc: Oral Oral Oral   SpO2: 91% 94% 93%   Weight:    85.9 kg  Height:        Intake/Output Summary (Last 24 hours) at 11/09/2023 1210 Last data filed at 11/09/2023 0700 Gross per 24 hour  Intake 201.33 ml  Output 1395 ml  Net -1193.67 ml   Filed Weights   11/07/23 0500 11/08/23 0500 11/09/23 0500  Weight: 83.3 kg 70.8 kg 85.9 kg    Examination:  Physical Exam: GEN: NAD, alert and oriented x 3, ill in appearance HEENT: NCAT, PERRL, EOMI, sclera clear, MMM PULM: CTAB w/o wheezes/crackles, normal respiratory effort, on room air with SpO2 93% CV: RRR w/o M/G/R GI: abd soft, slight tenderness to palpation surrounding surgical wound, no appreciable bowel sounds GU: Urostomy noted with yellow urine in collection bag MSK: no peripheral edema, moves all EXTR independently NEURO: No focal neurological deficits PSYCH: normal mood/affect Integumentary: Urostomy and abdominal surgical incision site noted, otherwise no other concerning rashes/lesions/wounds noted on exposed skin surfaces    Data Reviewed: I have personally reviewed following labs and imaging studies  CBC: Recent Labs  Lab 11/04/23 1931 11/05/23 0537 11/05/23 1420 11/05/23 1501 11/06/23 0314 11/07/23 0516 11/08/23 0535 11/09/23 0446  WBC 8.3 6.9  --   --  29.8* 13.1* 9.6 8.0  NEUTROABS 6.9  --   --   --   --  12.6*  --   --   HGB 14.1 13.8   < > 9.2* 10.4* 9.4* 9.1* 9.4*  HCT 42.7 42.1   < > 27.0* 31.9* 29.0* 28.8* 30.2*  MCV 85.6 86.8  --   --  86.9 86.1 88.1 89.9  PLT 146*  95*  --   --  75* 49* 53* 68*   < > = values in this interval not displayed.   Basic Metabolic Panel: Recent Labs  Lab 11/05/23 1628 11/06/23 0314 11/07/23 0516 11/08/23 0535 11/08/23 1603 11/09/23 0446  NA 139 136 142 145  --  148*  K 4.8 4.2 3.7 3.6  --  3.8  CL 114* 105 110 114*  --  116*  CO2 15* 20* 23 25  --  22   GLUCOSE 112* 156* 95 120*  --  117*  BUN 61* 59* 57* 49*  --  51*  CREATININE 4.85* 3.66* 3.75* 2.63*  --  2.55*  CALCIUM 7.7* 7.2* 7.1* 7.6*  --  8.1*  MG  --  1.7 1.8 2.0 2.1 2.3  PHOS  --  4.1 3.6 2.2* 2.6 2.4*   GFR: Estimated Creatinine Clearance: 24.6 mL/min (A) (by C-G formula based on SCr of 2.55 mg/dL (H)). Liver Function Tests: Recent Labs  Lab 11/04/23 1931 11/06/23 0314 11/08/23 0535 11/09/23 0446  AST 21 27 27  81*  ALT 19 16 20  45*  ALKPHOS 58 39 69 256*  BILITOT 0.8 0.8 0.5 0.5  PROT 7.3 5.3* 5.0* 5.5*  ALBUMIN 3.7 2.6* 2.1* 2.4*   No results for input(s): "LIPASE", "AMYLASE" in the last 168 hours. No results for input(s): "AMMONIA" in the last 168 hours. Coagulation Profile: Recent Labs  Lab 11/05/23 1628 11/06/23 0314 11/09/23 0446  INR 1.6* 1.8* 1.1   Cardiac Enzymes: No results for input(s): "CKTOTAL", "CKMB", "CKMBINDEX", "TROPONINI" in the last 168 hours. BNP (last 3 results) No results for input(s): "PROBNP" in the last 8760 hours. HbA1C: No results for input(s): "HGBA1C" in the last 72 hours. CBG: Recent Labs  Lab 11/08/23 2024 11/08/23 2339 11/09/23 0411 11/09/23 0800 11/09/23 1157  GLUCAP 110* 124* 118* 104* 116*   Lipid Profile: No results for input(s): "CHOL", "HDL", "LDLCALC", "TRIG", "CHOLHDL", "LDLDIRECT" in the last 72 hours. Thyroid Function Tests: No results for input(s): "TSH", "T4TOTAL", "FREET4", "T3FREE", "THYROIDAB" in the last 72 hours. Anemia Panel: No results for input(s): "VITAMINB12", "FOLATE", "FERRITIN", "TIBC", "IRON", "RETICCTPCT" in the last 72 hours. Sepsis Labs: Recent Labs  Lab 11/05/23 1015 11/05/23 1628 11/07/23 0824  LATICACIDVEN 7.1* 3.0* 1.3    Recent Results (from the past 240 hours)  Urine Culture (for pregnant, neutropenic or urologic patients or patients with an indwelling urinary catheter)     Status: Abnormal   Collection Time: 11/05/23  1:02 AM   Specimen: Urine, Clean Catch  Result  Value Ref Range Status   Specimen Description   Final    URINE, CLEAN CATCH Performed at [facility], [address], [place], [place] [address]    Special Requests   Final    Immunocompromised Performed at [facility], [address], [place], [place] [address]    Culture >=100,000 COLONIES/mL KLEBSIELLA PNEUMONIAE (A)  Final   Report Status 11/07/2023 FINAL  Final   Organism ID, Bacteria KLEBSIELLA PNEUMONIAE (A)  Final      Susceptibility   Klebsiella pneumoniae - MIC*    AMPICILLIN RESISTANT Resistant     CEFAZOLIN <=4 SENSITIVE Sensitive     CEFEPIME <=0.12 SENSITIVE Sensitive     CEFTRIAXONE <=0.25 SENSITIVE Sensitive     CIPROFLOXACIN <=0.25 SENSITIVE Sensitive     GENTAMICIN <=1 SENSITIVE Sensitive     IMIPENEM <=0.25 SENSITIVE Sensitive     NITROFURANTOIN <=16 SENSITIVE Sensitive     TRIMETH/SULFA <=  20 SENSITIVE Sensitive     AMPICILLIN/SULBACTAM <=2 SENSITIVE Sensitive     PIP/TAZO <=4 SENSITIVE Sensitive ug/mL    * >=100,000 COLONIES/mL KLEBSIELLA PNEUMONIAE  Culture, blood (Routine X 2) w Reflex to ID Panel     Status: Abnormal   Collection Time: 11/05/23  1:15 AM   Specimen: BLOOD  Result Value Ref Range Status   Specimen Description   Final    BLOOD RIGHT ANTECUBITAL Performed at [facility], [address] [place], [place] [address]    Special Requests   Final    BOTTLES DRAWN AEROBIC AND ANAEROBIC Blood Culture results may not be optimal due to an inadequate volume of blood received in culture bottles Performed at [facility], [address] [place], [place] [address]    Culture  Setup Time   Final    GRAM NEGATIVE RODS IN BOTH AEROBIC AND ANAEROBIC BOTTLES CRITICAL RESULT CALLED TO, READ BACK BY AND VERIFIED WITH: PHARMD MARY SWAYNE ON 11/05/23 @ 1415 BY DRT Performed at [facility], [address] [place], [place] [address]    Culture KLEBSIELLA PNEUMONIAE (A)  Final    Report Status 11/07/2023 FINAL  Final   Organism ID, Bacteria KLEBSIELLA PNEUMONIAE  Final   Organism ID, Bacteria KLEBSIELLA PNEUMONIAE  Final      Susceptibility   Klebsiella pneumoniae - KIRBY BAUER*    CEFAZOLIN SENSITIVE Sensitive    Klebsiella pneumoniae - MIC*    AMPICILLIN RESISTANT Resistant     CEFEPIME <=0.12 SENSITIVE Sensitive     CEFTAZIDIME <=1 SENSITIVE Sensitive     CEFTRIAXONE <=0.25 SENSITIVE Sensitive     CIPROFLOXACIN <=0.25 SENSITIVE Sensitive     GENTAMICIN <=1 SENSITIVE Sensitive     IMIPENEM <=0.25 SENSITIVE Sensitive     TRIMETH/SULFA <=20 SENSITIVE Sensitive     AMPICILLIN/SULBACTAM <=2 SENSITIVE Sensitive     PIP/TAZO <=4 SENSITIVE Sensitive ug/mL    * KLEBSIELLA PNEUMONIAE    KLEBSIELLA PNEUMONIAE  Blood Culture ID Panel (Reflexed)     Status: Abnormal   Collection Time: 11/05/23  1:15 AM  Result Value Ref Range Status   Enterococcus faecalis NOT DETECTED NOT DETECTED Final   Enterococcus Faecium NOT DETECTED NOT DETECTED Final   Listeria monocytogenes NOT DETECTED NOT DETECTED Final   Staphylococcus species NOT DETECTED NOT DETECTED Final   Staphylococcus aureus (BCID) NOT DETECTED NOT DETECTED Final   Staphylococcus epidermidis NOT DETECTED NOT DETECTED Final   Staphylococcus lugdunensis NOT DETECTED NOT DETECTED Final   Streptococcus species NOT DETECTED NOT DETECTED Final   Streptococcus agalactiae NOT DETECTED NOT DETECTED Final   Streptococcus pneumoniae NOT DETECTED NOT DETECTED Final   Streptococcus pyogenes NOT DETECTED NOT DETECTED Final   A.calcoaceticus-baumannii NOT DETECTED NOT DETECTED Final   Bacteroides fragilis NOT DETECTED NOT DETECTED Final   Enterobacterales DETECTED (A) NOT DETECTED Final    Comment: Enterobacterales represent a large order of gram negative bacteria, not a single organism. CRITICAL RESULT CALLED TO, READ BACK BY AND VERIFIED WITH: PHARMD MARY SWAYNE ON 11/05/23 @ 1415 BY DRT    Enterobacter cloacae complex  NOT DETECTED NOT DETECTED Final   Escherichia coli NOT DETECTED NOT DETECTED Final   Klebsiella aerogenes NOT DETECTED NOT DETECTED Final   Klebsiella oxytoca NOT DETECTED NOT DETECTED Final   Klebsiella pneumoniae DETECTED (A) NOT DETECTED Final    Comment: CRITICAL RESULT CALLED TO, READ BACK BY AND VERIFIED WITH: PHARMD MARY SWAYNE ON 11/05/23 @ 1415 BY DRT  Proteus species NOT DETECTED NOT DETECTED Final   Salmonella species NOT DETECTED NOT DETECTED Final   Serratia marcescens NOT DETECTED NOT DETECTED Final   Haemophilus influenzae NOT DETECTED NOT DETECTED Final   Neisseria meningitidis NOT DETECTED NOT DETECTED Final   Pseudomonas aeruginosa NOT DETECTED NOT DETECTED Final   Stenotrophomonas maltophilia NOT DETECTED NOT DETECTED Final   Candida albicans NOT DETECTED NOT DETECTED Final   Candida auris NOT DETECTED NOT DETECTED Final   Candida glabrata NOT DETECTED NOT DETECTED Final   Candida krusei NOT DETECTED NOT DETECTED Final   Candida parapsilosis NOT DETECTED NOT DETECTED Final   Candida tropicalis NOT DETECTED NOT DETECTED Final   Cryptococcus neoformans/gattii NOT DETECTED NOT DETECTED Final   CTX-M ESBL NOT DETECTED NOT DETECTED Final   Carbapenem resistance IMP NOT DETECTED NOT DETECTED Final   Carbapenem resistance KPC NOT DETECTED NOT DETECTED Final   Carbapenem resistance NDM NOT DETECTED NOT DETECTED Final   Carbapenem resist OXA 48 LIKE NOT DETECTED NOT DETECTED Final   Carbapenem resistance VIM NOT DETECTED NOT DETECTED Final    Comment: Performed at [facility], [address], [place], [place] [address]  Culture, blood (Routine X 2) w Reflex to ID Panel     Status: Abnormal   Collection Time: 11/05/23  1:16 AM   Specimen: BLOOD RIGHT HAND  Result Value Ref Range Status   Specimen Description   Final    BLOOD RIGHT HAND Performed at [facility], [address] [place], [place] [address]    Special Requests   Final    BOTTLES DRAWN AEROBIC  AND ANAEROBIC Blood Culture results may not be optimal due to an inadequate volume of blood received in culture bottles Performed at [facility], [address], [place], [place] [address]    Culture  Setup Time   Final    GRAM NEGATIVE RODS AEROBIC BOTTLE ONLY CRITICAL VALUE NOTED.  VALUE IS CONSISTENT WITH PREVIOUSLY REPORTED AND CALLED VALUE.    Culture (A)  Final    KLEBSIELLA PNEUMONIAE SUSCEPTIBILITIES PERFORMED ON PREVIOUS CULTURE WITHIN THE LAST 5 DAYS. Performed at [facility], [address], [place], [place] [address]    Report Status 11/07/2023 FINAL  Final  MRSA Next Gen by PCR, Nasal     Status: None   Collection Time: 11/05/23  5:31 PM   Specimen: Nasal Mucosa; Nasal Swab  Result Value Ref Range Status   MRSA by PCR Next Gen NOT DETECTED NOT DETECTED Final    Comment: (NOTE) The GeneXpert MRSA Assay (FDA approved for NASAL specimens only), is one component of a comprehensive MRSA colonization surveillance program. It is not intended to diagnose MRSA infection nor to guide or monitor treatment for MRSA infections. Test performance is not FDA approved in patients less than 43 years old. Performed at [facility], [address] [place], [place] [address]   Resp panel by RT-PCR (RSV, Flu A&B, Covid) Anterior Nasal Swab     Status: None   Collection Time: 11/05/23  6:47 PM   Specimen: Anterior Nasal Swab  Result Value Ref Range Status   SARS Coronavirus 2 by RT PCR NEGATIVE NEGATIVE Final    Comment: (NOTE) SARS-CoV-2 target nucleic acids are NOT DETECTED.  The SARS-CoV-2 RNA is generally detectable in upper respiratory specimens during the acute phase of infection. The lowest concentration of SARS-CoV-2 viral copies this assay can detect is 138 copies/mL. A negative result does  not preclude SARS-Cov-2 infection and should not be used as the sole basis for treatment or other patient management decisions. A negative  result may occur with  improper specimen collection/handling, submission of specimen other than nasopharyngeal swab, presence of viral mutation(s) within the areas targeted by this assay, and inadequate number of viral copies(<138 copies/mL). A negative result must be combined with clinical observations, patient history, and epidemiological information. The expected result is Negative.  Fact Sheet for Patients:  BloggerCourse.com  Fact Sheet for Healthcare Providers:  SeriousBroker.it  This test is no t yet approved or cleared by the [place] FDA and  has been authorized for detection and/or diagnosis of SARS-CoV-2 by FDA under an Emergency Use Authorization (EUA). This EUA will remain  in effect (meaning this test can be used) for the duration of the COVID-19 declaration under Section 564(b)(1) of the Act, 21 U.S.C.section 360bbb-3(b)(1), unless the authorization is terminated  or revoked sooner.       Influenza A by PCR NEGATIVE NEGATIVE Final   Influenza B by PCR NEGATIVE NEGATIVE Final    Comment: (NOTE) The Xpert Xpress SARS-CoV-2/FLU/RSV plus assay is intended as an aid in the diagnosis of influenza from Nasopharyngeal swab specimens and should not be used as a sole basis for treatment. Nasal washings and aspirates are unacceptable for Xpert Xpress SARS-CoV-2/FLU/RSV testing.  Fact Sheet for Patients: BloggerCourse.com  Fact Sheet for Healthcare Providers: SeriousBroker.it  This test is not yet approved or cleared by the [place] FDA and has been authorized for detection and/or diagnosis of SARS-CoV-2 by FDA under an Emergency Use Authorization (EUA). This EUA will remain in effect (meaning this test can be used) for the duration of the COVID-19 declaration under Section 564(b)(1) of the Act, 21 U.S.C. section 360bbb-3(b)(1), unless the authorization is  terminated or revoked.     Resp Syncytial Virus by PCR NEGATIVE NEGATIVE Final    Comment: (NOTE) Fact Sheet for Patients: BloggerCourse.com  Fact Sheet for Healthcare Providers: SeriousBroker.it  This test is not yet approved or cleared by the [place] FDA and has been authorized for detection and/or diagnosis of SARS-CoV-2 by FDA under an Emergency Use Authorization (EUA). This EUA will remain in effect (meaning this test can be used) for the duration of the COVID-19 declaration under Section 564(b)(1) of the Act, 21 U.S.C. section 360bbb-3(b)(1), unless the authorization is terminated or revoked.  Performed at [facility], [address], [place], [place] [address]          Radiology Studies: DG CHEST PORT 1 VIEW Result Date: 11/09/2023 CLINICAL DATA:  Acute hypoxemic respiratory failure. EXAM: PORTABLE CHEST 1 VIEW COMPARISON:  One-view chest x-ray 11/06/2023 FINDINGS: The patient has been extubated. Gastric tube courses off the inferior border of the film. Previously noted left IJ line was removed. Aeration of both lungs is improved. Lung volumes remain low. Improved bibasilar airspace opacities remain, right greater than left. A small right pleural effusion is suspected. IMPRESSION: 1. Interval extubation. 2. Improved aeration of both lungs. 3. Improved bibasilar airspace disease, ill-defined opacities remain, right greater than left. 4. Small right pleural effusion. Electronically Signed   By: Marin Roberts M.D.   On: 11/09/2023 11:53        Scheduled Meds:  Chlorhexidine Gluconate Cloth  6 each Topical Q2200   insulin aspart  0-9 Units Subcutaneous Q4H   lidocaine  1 patch Transdermal Q24H   pantoprazole  40 mg Oral Q1200   phosphorus  250 mg  Per Tube Once   Continuous Infusions:  cefTRIAXone (ROCEPHIN)  IV 2 g (11/09/23 0440)   feeding supplement (OSMOLITE 1.5 CAL) 30 mL/hr at  11/09/23 1126     LOS: 5 days    Time spent: 52 minutes spent on chart review, discussion with nursing staff, consultants, updating family and interview/physical exam; more than 50% of that time was spent in counseling and/or coordination of care.    Alvira Philips [place], DO [facility] Available via Epic secure chat 7am-7pm After these hours, please refer to coverage provider listed on [email or web address] 11/09/2023, 12:10 PM

## 2023-11-09 NOTE — Plan of Care (Signed)
  Problem: Role Relationship: Goal: Method of communication will improve Outcome: Progressing   Problem: Activity: Goal: Ability to tolerate increased activity will improve Outcome: Progressing   Problem: Skin Integrity: Goal: Risk for impaired skin integrity will decrease Outcome: Progressing   Problem: Safety: Goal: Ability to remain free from injury will improve Outcome: Progressing   Problem: Pain Managment: Goal: General experience of comfort will improve and/or be controlled Outcome: Progressing

## 2023-11-09 NOTE — Progress Notes (Signed)
 11/09/2023  Benjamin Hensley [id number] [birth year]  CARE TEAM: PCP: Thana Ates, MD  Outpatient Care Team: Patient Care Team: Thana Ates, MD as PCP - General (Internal Medicine)  Inpatient Treatment Team: Treatment Team:  [place], Eric J, DO Ccs, Md, MD Crista Elliot, MD Berneice Heinrich Delbert Phenix., MD Estanislado Emms, MD Jessie Foot, MD Payton Spark, RN Natale Milch, [place] Clydia Llano, RN Redmond Baseman, RN   Problem List:   Principal Problem:   Generalized abdominal pain Active Problems:   AKI (acute kidney injury) (HCC)   Sepsis (HCC)   SBO (small bowel obstruction) (HCC)   Shock (HCC)   Small bowel obstruction (HCC)   11/05/2023  POST-OPERATIVE DIAGNOSIS:  SMALL BOWEL OBSTRUCTION WITH NECROSIS   PROCEDURE:  Procedure(s): EXPLORATORY LAPAROTOMY; lysis of adhesions; small bowel resection; ligation of left ureter; primary hernia repair (N/A)   SURGEON:  Surgeons and Role:    * Griselda Miner, MD - Primary    * [name], Delbert Phenix., MD - Assisting    * Fredricka Bonine, Lady Deutscher, MD - Assisting    * Crista Elliot, MD - Assisting    Assessment [facility] Stay = 5 days) 4 Days Post-Op    Slowly stabilizing     Assessment/Plan:  EXPLORATORY LAPAROTOMY; lysis of adhesions; small bowel resection; ligation of left ureter; primary hernia repair (N/A)   83 y/o M w/ history of bladder cancer, cystoprostatectomy and ileal conduit, ex lap for SBO in 2023 who presented with acute abdominal pain and vomiting. CT significant for SBO in RLQ. He was in septic shock in the ED and was taken emergently to the operating room where a closed loop SBO was found.  S/P EXPLORATORY LAPAROTOMY; lysis of adhesions; small bowel resection; ligation of left ureter; primary hernia repair 2/25 Dr. Carolynne Edouard (with Dr. Alvester Morin, Dr. Berneice Heinrich)  - continue NG -Tolerating low-dose tube feeds.  Increase to 30 mL hour.  Would not advance beyond this until has obvious bowel function Follow  mental status.  Hopefully will improve to the point [place] consider speech therapy and swallowing once delirium resolved.  Not quite ready now. Wound clean.  Start wound VAC   - continue JP drain, it is sitting in the pelvis where the left ureter was ligated.  Keep for now.  Consider drain creatinine before removal or if output increases.  -Telemetry for now.  No major events.  Consider stopping.  Defer to primary service.   Renal failure nonoliguric.  Follow ileal urostomy conduit   - IV tylenol, lidoderm patch for pain. Avoid NSAIDs/IV robaxin given AKI/CKD. Will speak to CCM but would like to start some IV narcotic meds as he seems very uncomfortable this morning. Delirium precautions as able     FEN: NPO, IVF per primary, NG to LIWS ID: cefepime, flagyl Foley: none - urostomy productive VTE: daily lovenox     I reviewed nursing notes, hospitalist notes, last 24 h vitals and pain scores, last 48 h intake and output, last 24 h labs and trends, and last 24 h imaging results.  I have reviewed this patient's available data, including medical history, events of note, test results, etc as part of my evaluation.   A significant portion of that time was spent in counseling. Care during the described time interval was provided by me.  This care required straight-forward level of medical decision making.  11/09/2023    Subjective: (Chief complaint)  No major events.  Seen in room.  On 1mL/hr of tube feeds without residuals.  Patient remains confused with mittens but not belligerent.  Objective:  Vital signs:  Vitals:   11/08/23 2023 11/08/23 2357 11/09/23 0413 11/09/23 0500  BP: (!) 140/69 130/66 122/77   Pulse: 87 85 70   Resp: (!) 21 19 18    Temp: 98.8 F (37.1 C) 97.9 F (36.6 C) 99.1 F (37.3 C)   TempSrc: Oral Oral Oral   SpO2: 91% 94% 93%   Weight:    85.9 kg  Height:           Intake/Output   Yesterday:  02/28 0701 - 03/01 0700 In: 1185.9 [I.V.:788.7;  NG/GT:201.3; IV Piggyback:195.9] Out: 1768 [Urine:1725; Drains:43] This shift:  No intake/output data recorded.  Bowel function:  Flatus: No  BM:  No  Drain: Serosanguinous   Physical Exam:  General: Pt awake/alert in no acute distress Eyes: PERRL, normal EOM.  Sclera clear.  No icterus Neuro: CN II-XII intact w/o focal sensory/motor deficits. Lymph: No head/neck/groin lymphadenopathy Psych: Still confused and not well oriented.  Consistent with some delirium/ICU psychosis.  No major belligerence  HENT: Normocephalic, Mucus membranes moist.  No thrush Neck: Supple, No tracheal deviation.  No obvious thyromegaly Chest: No pain to chest wall compression.  Good respiratory excursion.  No audible wheezing CV:  Pulses intact.  Regular rhythm.  No major extremity edema MS: Normal AROM mjr joints.  No obvious deformity  Abdomen: Soft.  Nondistended.  Midline incision wound clean.  Urostomy pink with scant clear yellow urine in bag.  No evidence of peritonitis.  No incarcerated hernias.  Ext:   No deformity.  No mjr edema.  No cyanosis Skin: No petechiae / purpurea.  No major sores.  Warm and dry    Results:   Cultures: Recent Results (from the past 720 hours)  Urine Culture (for pregnant, neutropenic or urologic patients or patients with an indwelling urinary catheter)     Status: Abnormal   Collection Time: 11/05/23  1:02 AM   Specimen: Urine, Clean Catch  Result Value Ref Range Status   Specimen Description   Final    URINE, CLEAN CATCH Performed at [facility], [address], [place], [place] [address]    Special Requests   Final    Immunocompromised Performed at [facility], [address], [place], [place] [address]    Culture >=100,000 COLONIES/mL KLEBSIELLA PNEUMONIAE (A)  Final   Report Status 11/07/2023 FINAL  Final   Organism ID, Bacteria KLEBSIELLA PNEUMONIAE (A)  Final      Susceptibility   Klebsiella pneumoniae - MIC*     AMPICILLIN RESISTANT Resistant     CEFAZOLIN <=4 SENSITIVE Sensitive     CEFEPIME <=0.12 SENSITIVE Sensitive     CEFTRIAXONE <=0.25 SENSITIVE Sensitive     CIPROFLOXACIN <=0.25 SENSITIVE Sensitive     GENTAMICIN <=1 SENSITIVE Sensitive     IMIPENEM <=0.25 SENSITIVE Sensitive     NITROFURANTOIN <=16 SENSITIVE Sensitive     TRIMETH/SULFA <=20 SENSITIVE Sensitive     AMPICILLIN/SULBACTAM <=2 SENSITIVE Sensitive     PIP/TAZO <=4 SENSITIVE Sensitive ug/mL    * >=100,000 COLONIES/mL KLEBSIELLA PNEUMONIAE  Culture, blood (Routine X 2) w Reflex to ID Panel     Status: Abnormal   Collection Time: 11/05/23  1:15 AM   Specimen: BLOOD  Result Value Ref Range Status   Specimen Description   Final    BLOOD RIGHT ANTECUBITAL Performed at [facility], [address] [place], [place] [address]    Special Requests   Final    BOTTLES DRAWN AEROBIC AND ANAEROBIC Blood Culture results may not be optimal due to an inadequate volume of blood received in culture bottles Performed at [facility], [address], [place], [place] [address]    Culture  Setup Time   Final    GRAM NEGATIVE RODS IN BOTH AEROBIC AND ANAEROBIC BOTTLES CRITICAL RESULT CALLED TO, READ BACK BY AND VERIFIED WITH: PHARMD MARY SWAYNE ON 11/05/23 @ 1415 BY DRT Performed at [facility], [address] [place], [place] [address]    Culture KLEBSIELLA PNEUMONIAE (A)  Final   Report Status 11/07/2023 FINAL  Final   Organism ID, Bacteria KLEBSIELLA PNEUMONIAE  Final   Organism ID, Bacteria KLEBSIELLA PNEUMONIAE  Final      Susceptibility   Klebsiella pneumoniae - KIRBY BAUER*    CEFAZOLIN SENSITIVE Sensitive    Klebsiella pneumoniae - MIC*    AMPICILLIN RESISTANT Resistant     CEFEPIME <=0.12 SENSITIVE Sensitive     CEFTAZIDIME <=1 SENSITIVE Sensitive     CEFTRIAXONE <=0.25 SENSITIVE Sensitive     CIPROFLOXACIN <=0.25 SENSITIVE Sensitive     GENTAMICIN <=1 SENSITIVE Sensitive      IMIPENEM <=0.25 SENSITIVE Sensitive     TRIMETH/SULFA <=20 SENSITIVE Sensitive     AMPICILLIN/SULBACTAM <=2 SENSITIVE Sensitive     PIP/TAZO <=4 SENSITIVE Sensitive ug/mL    * KLEBSIELLA PNEUMONIAE    KLEBSIELLA PNEUMONIAE  Blood Culture ID Panel (Reflexed)     Status: Abnormal   Collection Time: 11/05/23  1:15 AM  Result Value Ref Range Status   Enterococcus faecalis NOT DETECTED NOT DETECTED Final   Enterococcus Faecium NOT DETECTED NOT DETECTED Final   Listeria monocytogenes NOT DETECTED NOT DETECTED Final   Staphylococcus species NOT DETECTED NOT DETECTED Final   Staphylococcus aureus (BCID) NOT DETECTED NOT DETECTED Final   Staphylococcus epidermidis NOT DETECTED NOT DETECTED Final   Staphylococcus lugdunensis NOT DETECTED NOT DETECTED Final   Streptococcus species NOT DETECTED NOT DETECTED Final   Streptococcus agalactiae NOT DETECTED NOT DETECTED Final   Streptococcus pneumoniae NOT DETECTED NOT DETECTED Final   Streptococcus pyogenes NOT DETECTED NOT DETECTED Final   A.calcoaceticus-baumannii NOT DETECTED NOT DETECTED Final   Bacteroides fragilis NOT DETECTED NOT DETECTED Final   Enterobacterales DETECTED (A) NOT DETECTED Final    Comment: Enterobacterales represent a large order of gram negative bacteria, not a single organism. CRITICAL RESULT CALLED TO, READ BACK BY AND VERIFIED WITH: PHARMD MARY SWAYNE ON 11/05/23 @ 1415 BY DRT    Enterobacter cloacae complex NOT DETECTED NOT DETECTED Final   Escherichia coli NOT DETECTED NOT DETECTED Final   Klebsiella aerogenes NOT DETECTED NOT DETECTED Final   Klebsiella oxytoca NOT DETECTED NOT DETECTED Final   Klebsiella pneumoniae DETECTED (A) NOT DETECTED Final    Comment: CRITICAL RESULT CALLED TO, READ BACK BY AND VERIFIED WITH: PHARMD MARY SWAYNE ON 11/05/23 @ 1415 BY DRT    Proteus species NOT DETECTED NOT DETECTED Final   Salmonella species NOT DETECTED NOT DETECTED Final   Serratia marcescens NOT DETECTED NOT DETECTED Final    Haemophilus influenzae NOT DETECTED NOT DETECTED Final   Neisseria meningitidis NOT DETECTED NOT DETECTED Final   Pseudomonas aeruginosa NOT DETECTED NOT DETECTED Final   Stenotrophomonas maltophilia NOT DETECTED NOT DETECTED Final   Candida albicans NOT DETECTED NOT DETECTED Final   Candida auris NOT DETECTED NOT  DETECTED Final   Candida glabrata NOT DETECTED NOT DETECTED Final   Candida krusei NOT DETECTED NOT DETECTED Final   Candida parapsilosis NOT DETECTED NOT DETECTED Final   Candida tropicalis NOT DETECTED NOT DETECTED Final   Cryptococcus neoformans/gattii NOT DETECTED NOT DETECTED Final   CTX-M ESBL NOT DETECTED NOT DETECTED Final   Carbapenem resistance IMP NOT DETECTED NOT DETECTED Final   Carbapenem resistance KPC NOT DETECTED NOT DETECTED Final   Carbapenem resistance NDM NOT DETECTED NOT DETECTED Final   Carbapenem resist OXA 48 LIKE NOT DETECTED NOT DETECTED Final   Carbapenem resistance VIM NOT DETECTED NOT DETECTED Final    Comment: Performed at [facility], [address] [place], [place] [address]  Culture, blood (Routine X 2) w Reflex to ID Panel     Status: Abnormal   Collection Time: 11/05/23  1:16 AM   Specimen: BLOOD RIGHT HAND  Result Value Ref Range Status   Specimen Description   Final    BLOOD RIGHT HAND Performed at [facility], [address] [place], [place] [address]    Special Requests   Final    BOTTLES DRAWN AEROBIC AND ANAEROBIC Blood Culture results may not be optimal due to an inadequate volume of blood received in culture bottles Performed at [facility], [address], [place], [place] [address]    Culture  Setup Time   Final    GRAM NEGATIVE RODS AEROBIC BOTTLE ONLY CRITICAL VALUE NOTED.  VALUE IS CONSISTENT WITH PREVIOUSLY REPORTED AND CALLED VALUE.    Culture (A)  Final    KLEBSIELLA PNEUMONIAE SUSCEPTIBILITIES PERFORMED ON PREVIOUS CULTURE WITHIN THE LAST 5 DAYS. Performed at [facility], [address], [place], [place] [address]    Report Status 11/07/2023 FINAL  Final  MRSA Next Gen by PCR, Nasal     Status: None   Collection Time: 11/05/23  5:31 PM   Specimen: Nasal Mucosa; Nasal Swab  Result Value Ref Range Status   MRSA by PCR Next Gen NOT DETECTED NOT DETECTED Final    Comment: (NOTE) The GeneXpert MRSA Assay (FDA approved for NASAL specimens only), is one component of a comprehensive MRSA colonization surveillance program. It is not intended to diagnose MRSA infection nor to guide or monitor treatment for MRSA infections. Test performance is not FDA approved in patients less than 11 years old. Performed at [facility], [address], [place], [place] [address]   Resp panel by RT-PCR (RSV, Flu A&B, Covid) Anterior Nasal Swab     Status: None   Collection Time: 11/05/23  6:47 PM   Specimen: Anterior Nasal Swab  Result Value Ref Range Status   SARS Coronavirus 2 by RT PCR NEGATIVE NEGATIVE Final    Comment: (NOTE) SARS-CoV-2 target nucleic acids are NOT DETECTED.  The SARS-CoV-2 RNA is generally detectable in upper respiratory specimens during the acute phase of infection. The lowest concentration of SARS-CoV-2 viral copies this assay can detect is 138 copies/mL. A negative result does not preclude SARS-Cov-2 infection and should not be used as the sole basis for treatment or other patient management decisions. A negative result may occur with  improper specimen collection/handling, submission of specimen other than nasopharyngeal swab, presence of viral mutation(s) within the areas targeted by this assay, and inadequate number of viral copies(<138 copies/mL). A negative result must be combined with clinical observations, patient history, and epidemiological information. The expected result is Negative.  Fact Sheet  for Patients:  BloggerCourse.com  Fact Sheet for Healthcare Providers:   SeriousBroker.it  This test is no t yet approved or cleared by the [place] FDA and  has been authorized for detection and/or diagnosis of SARS-CoV-2 by FDA under an Emergency Use Authorization (EUA). This EUA will remain  in effect (meaning this test can be used) for the duration of the COVID-19 declaration under Section 564(b)(1) of the Act, 21 U.S.C.section 360bbb-3(b)(1), unless the authorization is terminated  or revoked sooner.       Influenza A by PCR NEGATIVE NEGATIVE Final   Influenza B by PCR NEGATIVE NEGATIVE Final    Comment: (NOTE) The Xpert Xpress SARS-CoV-2/FLU/RSV plus assay is intended as an aid in the diagnosis of influenza from Nasopharyngeal swab specimens and should not be used as a sole basis for treatment. Nasal washings and aspirates are unacceptable for Xpert Xpress SARS-CoV-2/FLU/RSV testing.  Fact Sheet for Patients: BloggerCourse.com  Fact Sheet for Healthcare Providers: SeriousBroker.it  This test is not yet approved or cleared by the [place] FDA and has been authorized for detection and/or diagnosis of SARS-CoV-2 by FDA under an Emergency Use Authorization (EUA). This EUA will remain in effect (meaning this test can be used) for the duration of the COVID-19 declaration under Section 564(b)(1) of the Act, 21 U.S.C. section 360bbb-3(b)(1), unless the authorization is terminated or revoked.     Resp Syncytial Virus by PCR NEGATIVE NEGATIVE Final    Comment: (NOTE) Fact Sheet for Patients: BloggerCourse.com  Fact Sheet for Healthcare Providers: SeriousBroker.it  This test is not yet approved or cleared by the [place] FDA and has been authorized for detection and/or diagnosis of SARS-CoV-2 by FDA under an Emergency Use Authorization (EUA). This EUA will remain in effect (meaning this test can be used) for  the duration of the COVID-19 declaration under Section 564(b)(1) of the Act, 21 U.S.C. section 360bbb-3(b)(1), unless the authorization is terminated or revoked.  Performed at [facility], [address] [place], [place] [address]     Labs: Results for orders placed or performed during the hospital encounter of 11/04/23 (from the past 48 hours)  Glucose, capillary     Status: Abnormal   Collection Time: 11/07/23 12:17 PM  Result Value Ref Range   Glucose-Capillary 100 (H) 70 - 99 mg/dL    Comment: Glucose reference range applies only to samples taken after fasting for at least 8 hours.  Glucose, capillary     Status: Abnormal   Collection Time: 11/07/23  4:35 PM  Result Value Ref Range   Glucose-Capillary 111 (H) 70 - 99 mg/dL    Comment: Glucose reference range applies only to samples taken after fasting for at least 8 hours.  Glucose, capillary     Status: None   Collection Time: 11/07/23  7:42 PM  Result Value Ref Range   Glucose-Capillary 93 70 - 99 mg/dL    Comment: Glucose reference range applies only to samples taken after fasting for at least 8 hours.   Comment 1 Notify RN    Comment 2 Document in Chart   Glucose, capillary     Status: Abnormal   Collection Time: 11/07/23 11:45 PM  Result Value Ref Range   Glucose-Capillary 100 (H) 70 - 99 mg/dL    Comment: Glucose reference range applies only to samples taken after fasting for at least 8 hours.   Comment 1 Notify RN    Comment 2 Document in Chart   Glucose, capillary  Status: Abnormal   Collection Time: 11/08/23  4:05 AM  Result Value Ref Range   Glucose-Capillary 106 (H) 70 - 99 mg/dL    Comment: Glucose reference range applies only to samples taken after fasting for at least 8 hours.   Comment 1 Notify RN    Comment 2 Document in Chart   CBC     Status: Abnormal   Collection Time: 11/08/23  5:35 AM  Result Value Ref Range   WBC 9.6 4.0 - 10.5 K/uL   RBC 3.27 (L) 4.22 - 5.81 MIL/uL    Hemoglobin 9.1 (L) 13.0 - 17.0 g/dL   HCT 56.2 (L) 13.0 - 86.5 %   MCV 88.1 80.0 - 100.0 fL   MCH 27.8 26.0 - 34.0 pg   MCHC 31.6 30.0 - 36.0 g/dL   RDW 78.4 69.6 - 29.5 %   Platelets 53 (L) 150 - 400 K/uL    Comment: Immature Platelet Fraction may be clinically indicated, consider ordering this additional test [id number] CONSISTENT WITH PREVIOUS RESULT    nRBC 0.0 0.0 - 0.2 %    Comment: Performed at [facility], [address], [place], [place] [address]  Comprehensive metabolic panel     Status: Abnormal   Collection Time: 11/08/23  5:35 AM  Result Value Ref Range   Sodium 145 135 - 145 mmol/L   Potassium 3.6 3.5 - 5.1 mmol/L   Chloride 114 (H) 98 - 111 mmol/L   CO2 25 22 - 32 mmol/L   Glucose, Bld 120 (H) 70 - 99 mg/dL    Comment: Glucose reference range applies only to samples taken after fasting for at least 8 hours.   BUN 49 (H) 8 - 23 mg/dL   Creatinine, Ser 2.53 (H) 0.61 - 1.24 mg/dL   Calcium 7.6 (L) 8.9 - 10.3 mg/dL   Total Protein 5.0 (L) 6.5 - 8.1 g/dL   Albumin 2.1 (L) 3.5 - 5.0 g/dL   AST 27 15 - 41 U/L   ALT 20 0 - 44 U/L   Alkaline Phosphatase 69 38 - 126 U/L   Total Bilirubin 0.5 0.0 - 1.2 mg/dL   GFR, Estimated 24 (L) >60 mL/min    Comment: (NOTE) Calculated using the CKD-EPI Creatinine Equation (2021)    Anion gap 6 5 - 15    Comment: Performed at [facility], [address], [place], [place] [address]  Magnesium     Status: None   Collection Time: 11/08/23  5:35 AM  Result Value Ref Range   Magnesium 2.0 1.7 - 2.4 mg/dL    Comment: Performed at [facility], [address], [place], [place] [address]  Phosphorus     Status: Abnormal   Collection Time: 11/08/23  5:35 AM  Result Value Ref Range   Phosphorus 2.2 (L) 2.5 - 4.6 mg/dL    Comment: Performed at [facility], [address] [place], [place] [address]  Glucose, capillary     Status: Abnormal   Collection Time:  11/08/23  8:20 AM  Result Value Ref Range   Glucose-Capillary 102 (H) 70 - 99 mg/dL    Comment: Glucose reference range applies only to samples taken after fasting for at least 8 hours.   Comment 1 Notify RN    Comment 2 Document in Chart   Glucose, capillary     Status: Abnormal   Collection Time: 11/08/23 11:33 AM  Result Value Ref Range   Glucose-Capillary 109 (H)  70 - 99 mg/dL    Comment: Glucose reference range applies only to samples taken after fasting for at least 8 hours.  Glucose, capillary     Status: Abnormal   Collection Time: 11/08/23  3:48 PM  Result Value Ref Range   Glucose-Capillary 111 (H) 70 - 99 mg/dL    Comment: Glucose reference range applies only to samples taken after fasting for at least 8 hours.  Magnesium     Status: None   Collection Time: 11/08/23  4:03 PM  Result Value Ref Range   Magnesium 2.1 1.7 - 2.4 mg/dL    Comment: Performed at [facility], [address], [place], [place] [address]  Phosphorus     Status: None   Collection Time: 11/08/23  4:03 PM  Result Value Ref Range   Phosphorus 2.6 2.5 - 4.6 mg/dL    Comment: Performed at [facility], [address], [place], [place] [address]  Glucose, capillary     Status: Abnormal   Collection Time: 11/08/23  8:24 PM  Result Value Ref Range   Glucose-Capillary 110 (H) 70 - 99 mg/dL    Comment: Glucose reference range applies only to samples taken after fasting for at least 8 hours.  Glucose, capillary     Status: Abnormal   Collection Time: 11/08/23 11:39 PM  Result Value Ref Range   Glucose-Capillary 124 (H) 70 - 99 mg/dL    Comment: Glucose reference range applies only to samples taken after fasting for at least 8 hours.  Glucose, capillary     Status: Abnormal   Collection Time: 11/09/23  4:11 AM  Result Value Ref Range   Glucose-Capillary 118 (H) 70 - 99 mg/dL    Comment: Glucose reference range applies only to samples taken after fasting for at least 8  hours.  CBC     Status: Abnormal   Collection Time: 11/09/23  4:46 AM  Result Value Ref Range   WBC 8.0 4.0 - 10.5 K/uL   RBC 3.36 (L) 4.22 - 5.81 MIL/uL   Hemoglobin 9.4 (L) 13.0 - 17.0 g/dL   HCT 09.8 (L) 11.9 - 14.7 %   MCV 89.9 80.0 - 100.0 fL   MCH 28.0 26.0 - 34.0 pg   MCHC 31.1 30.0 - 36.0 g/dL   RDW 82.9 56.2 - 13.0 %   Platelets 68 (L) 150 - 400 K/uL    Comment: Immature Platelet Fraction may be clinically indicated, consider ordering this additional test [id number] CONSISTENT WITH PREVIOUS RESULT    nRBC 0.0 0.0 - 0.2 %    Comment: Performed at [facility], [address], [place], [place] [address]  Comprehensive metabolic panel     Status: Abnormal   Collection Time: 11/09/23  4:46 AM  Result Value Ref Range   Sodium 148 (H) 135 - 145 mmol/L   Potassium 3.8 3.5 - 5.1 mmol/L   Chloride 116 (H) 98 - 111 mmol/L   CO2 22 22 - 32 mmol/L   Glucose, Bld 117 (H) 70 - 99 mg/dL    Comment: Glucose reference range applies only to samples taken after fasting for at least 8 hours.   BUN 51 (H) 8 - 23 mg/dL   Creatinine, Ser 8.41 (H) 0.61 - 1.24 mg/dL   Calcium 8.1 (L) 8.9 - 10.3 mg/dL   Total Protein 5.5 (L) 6.5 - 8.1 g/dL   Albumin 2.4 (L) 3.5 - 5.0 g/dL   AST 81 (H) 15 - 41  U/L   ALT 45 (H) 0 - 44 U/L   Alkaline Phosphatase 256 (H) 38 - 126 U/L   Total Bilirubin 0.5 0.0 - 1.2 mg/dL   GFR, Estimated 25 (L) >60 mL/min    Comment: (NOTE) Calculated using the CKD-EPI Creatinine Equation (2021)    Anion gap 10 5 - 15    Comment: Performed at [facility], [address], [place], [place] [address]  Magnesium     Status: None   Collection Time: 11/09/23  4:46 AM  Result Value Ref Range   Magnesium 2.3 1.7 - 2.4 mg/dL    Comment: Performed at [facility], [address] [place], [place] [address]  Phosphorus     Status: Abnormal   Collection Time: 11/09/23  4:46 AM  Result Value Ref Range   Phosphorus 2.4 (L) 2.5  - 4.6 mg/dL    Comment: Performed at [facility], [address], [place], [place] [address]  Protime-INR     Status: None   Collection Time: 11/09/23  4:46 AM  Result Value Ref Range   Prothrombin Time 14.7 11.4 - 15.2 seconds   INR 1.1 0.8 - 1.2    Comment: (NOTE) INR goal varies based on device and disease states. Performed at [facility], [address], [place], [place] [address]   Glucose, capillary     Status: Abnormal   Collection Time: 11/09/23  8:00 AM  Result Value Ref Range   Glucose-Capillary 104 (H) 70 - 99 mg/dL    Comment: Glucose reference range applies only to samples taken after fasting for at least 8 hours.    Imaging / Studies: No results found.  Medications / Allergies: per chart  Antibiotics: Anti-infectives (From admission, onward)    Start     Dose/Rate Route Frequency Ordered Stop   11/07/23 0500  cefTRIAXone (ROCEPHIN) 2 g in sodium chloride 0.9 % 100 mL IVPB        2 g 200 mL/hr over 30 Minutes Intravenous Every 24 hours 11/06/23 1046     11/06/23 0400  ceFEPIme (MAXIPIME) 1 g in sodium chloride 0.9 % 100 mL IVPB  Status:  Discontinued        1 g 200 mL/hr over 30 Minutes Intravenous Every 24 hours 11/05/23 0456 11/05/23 1419   11/06/23 0400  ceFEPIme (MAXIPIME) 2 g in sodium chloride 0.9 % 100 mL IVPB  Status:  Discontinued        2 g 200 mL/hr over 30 Minutes Intravenous Every 24 hours 11/05/23 1419 11/06/23 1046   11/05/23 1200  metroNIDAZOLE (FLAGYL) IVPB 500 mg  Status:  Discontinued        500 mg 100 mL/hr over 60 Minutes Intravenous Every 12 hours 11/05/23 1106 11/06/23 1046   11/05/23 0345  ceFEPIme (MAXIPIME) 2 g in sodium chloride 0.9 % 100 mL IVPB        2 g 200 mL/hr over 30 Minutes Intravenous  Once 11/05/23 0331 11/05/23 0424         Note: Portions of this report may have been transcribed using voice recognition software. Every effort was made to ensure accuracy; however, inadvertent  computerized transcription errors may be present.   Any transcriptional errors that result from this process are unintentional.    Ardeth Sportsman, MD, FACS, MASCRS Esophageal, Gastrointestinal & Colorectal Surgery Robotic and Minimally Invasive Surgery  [facility] [address], [phone]  [place], [place] [address] [phone] Fax [phone] Main  CONTACT INFORMATION: Weekday (9AM-5PM): Call [facility] main office at [phone] Weeknight (5PM-9AM) or Weekend/Holiday: Check EPIC "Web Links" tab & use "AMION" (password " [email or web address]") for General Surgery [facility] coverage  Please, DO NOT use SecureChat  (it is not reliable communication to reach operating surgeons & will lead to a delay in care).   Epic staff messaging available for outptient concerns needing 1-2 business day response.      11/09/2023  9:49 AM

## 2023-11-09 NOTE — Progress Notes (Signed)
 Mobility Specialist - Progress Note   11/09/23 1543  Mobility  Activity Transferred from chair to bed  Level of Assistance +2 (takes two people)  Press photographer wheel walker  Activity Response Tolerated well  Mobility Referral No  Mobility visit 1 Mobility  Mobility Specialist Start Time (ACUTE ONLY) 1524  Mobility Specialist Stop Time (ACUTE ONLY) 1543  Mobility Specialist Time Calculation (min) (ACUTE ONLY) 19 min   Pt received in recliner requesting assistance back to bed. RN present during session. Pt was modA +2 from STS & CG during transfer. Pt required minA when getting back into bed. No complaints during session. Pt to bed after session with all needs met. RN in room.    [facility]

## 2023-11-09 NOTE — Progress Notes (Signed)
 Patient requested to see his sons. Contacted son [name] by phone and advised him of patient's request. Provided son [name] with an update on new orders placed by Dr. Michaell Cowing and on patient care events. Son verbalized understanding and appreciation of update. Son stated he and his brother Arlys John will be visiting patient "within the hour".

## 2023-11-10 DIAGNOSIS — R1084 Generalized abdominal pain: Secondary | ICD-10-CM | POA: Diagnosis not present

## 2023-11-10 DIAGNOSIS — E440 Moderate protein-calorie malnutrition: Secondary | ICD-10-CM | POA: Insufficient documentation

## 2023-11-10 LAB — COMPREHENSIVE METABOLIC PANEL
ALT: 127 U/L — ABNORMAL HIGH (ref 0–44)
AST: 149 U/L — ABNORMAL HIGH (ref 15–41)
Albumin: 2.5 g/dL — ABNORMAL LOW (ref 3.5–5.0)
Alkaline Phosphatase: 352 U/L — ABNORMAL HIGH (ref 38–126)
Anion gap: 9 (ref 5–15)
BUN: 51 mg/dL — ABNORMAL HIGH (ref 8–23)
CO2: 23 mmol/L (ref 22–32)
Calcium: 8.4 mg/dL — ABNORMAL LOW (ref 8.9–10.3)
Chloride: 121 mmol/L — ABNORMAL HIGH (ref 98–111)
Creatinine, Ser: 2.31 mg/dL — ABNORMAL HIGH (ref 0.61–1.24)
GFR, Estimated: 28 mL/min — ABNORMAL LOW (ref >60)
Glucose, Bld: 136 mg/dL — ABNORMAL HIGH (ref 70–99)
Potassium: 4.5 mmol/L (ref 3.5–5.1)
Sodium: 153 mmol/L — ABNORMAL HIGH (ref 135–145)
Total Bilirubin: 0.5 mg/dL (ref 0.0–1.2)
Total Protein: 5.7 g/dL — ABNORMAL LOW (ref 6.5–8.1)

## 2023-11-10 LAB — CBC
HCT: 34 % — ABNORMAL LOW (ref 39.0–52.0)
Hemoglobin: 10.1 g/dL — ABNORMAL LOW (ref 13.0–17.0)
MCH: 27.3 pg (ref 26.0–34.0)
MCHC: 29.7 g/dL — ABNORMAL LOW (ref 30.0–36.0)
MCV: 91.9 fL (ref 80.0–100.0)
Platelets: 93 10*3/uL — ABNORMAL LOW (ref 150–400)
RBC: 3.7 MIL/uL — ABNORMAL LOW (ref 4.22–5.81)
RDW: 14.3 % (ref 11.5–15.5)
WBC: 8.4 10*3/uL (ref 4.0–10.5)
nRBC: 0 % (ref 0.0–0.2)

## 2023-11-10 LAB — GLUCOSE, CAPILLARY
Glucose-Capillary: 114 mg/dL — ABNORMAL HIGH (ref 70–99)
Glucose-Capillary: 122 mg/dL — ABNORMAL HIGH (ref 70–99)
Glucose-Capillary: 126 mg/dL — ABNORMAL HIGH (ref 70–99)
Glucose-Capillary: 156 mg/dL — ABNORMAL HIGH (ref 70–99)
Glucose-Capillary: 110 mg/dL — ABNORMAL HIGH (ref 70–99)
Glucose-Capillary: 90 mg/dL (ref 70–99)

## 2023-11-10 LAB — CREATININE, FLUID (PLEURAL, PERITONEAL, JP DRAINAGE): Creat, Fluid: 2.4 mg/dL

## 2023-11-10 LAB — MAGNESIUM: Magnesium: 2.5 mg/dL — ABNORMAL HIGH (ref 1.7–2.4)

## 2023-11-10 LAB — PHOSPHORUS: Phosphorus: 2.8 mg/dL (ref 2.5–4.6)

## 2023-11-10 MED ORDER — OXYCODONE HCL 5 MG PO TABS
5.0000 mg | ORAL_TABLET | ORAL | Status: DC | PRN
Start: 2023-11-10 — End: 2023-11-11

## 2023-11-10 MED ORDER — OXYCODONE HCL 5 MG PO TABS
5.0000 mg | ORAL_TABLET | ORAL | Status: DC | PRN
  Administered 2023-11-10: 5 mg via ORAL
  Filled 2023-11-10: qty 1

## 2023-11-10 MED ORDER — DEXTROSE 5 % IV SOLN: INTRAVENOUS | Status: AC

## 2023-11-10 MED ORDER — ACETAMINOPHEN 500 MG PO TABS
1000.0000 mg | ORAL_TABLET | Freq: Four times a day (QID) | ORAL | Status: DC
  Administered 2023-11-10: 1000 mg via ORAL
  Filled 2023-11-10: qty 2

## 2023-11-10 MED ORDER — OSMOLITE 1.5 CAL PO LIQD
1000.0000 mL | ORAL | Status: DC
  Filled 2023-11-10 (×2): qty 1000

## 2023-11-10 NOTE — Progress Notes (Signed)
 OT Cancellation Note  Patient Details Name: [name] MRN: [id number] DOB: 1941/05/31   Cancelled Treatment:    Reason Eval/Treat Not Completed: Other (comment) Patient having nursing in room with tube recently removed and SLP finishing up with patient. Patient asked for OT to check back on 11/11/23. OT to continue to follow Benjamin Loud, MS Acute Rehabilitation Department Office# [phone]  11/10/2023, 3:12 PM

## 2023-11-10 NOTE — Progress Notes (Addendum)
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 bibasilar  airspace disease, ill-defined opacities remain, right greater than left. 4. Small right pleural effusion. Electronically Signed   By: Marin Roberts M.D.   On: 11/09/2023 11:53    Medications / Allergies: per chart  Antibiotics: Anti-infectives (From admission, onward)    Start     Dose/Rate Route Frequency Ordered Stop   11/07/23 0500  cefTRIAXone (ROCEPHIN) 2 g in sodium chloride 0.9 % 100 mL IVPB        2 g 200 mL/hr over 30 Minutes Intravenous Every 24 hours 11/06/23 1046     11/06/23 0400  ceFEPIme (MAXIPIME) 1 g in sodium chloride 0.9 % 100 mL IVPB  Status:  Discontinued        1 g 200 mL/hr over 30 Minutes  Intravenous Every 24 hours 11/05/23 0456 11/05/23 1419   11/06/23 0400  ceFEPIme (MAXIPIME) 2 g in sodium chloride 0.9 % 100 mL IVPB  Status:  Discontinued        2 g 200 mL/hr over 30 Minutes Intravenous Every 24 hours 11/05/23 1419 11/06/23 1046   11/05/23 1200  metroNIDAZOLE (FLAGYL) IVPB 500 mg  Status:  Discontinued        500 mg 100 mL/hr over 60 Minutes Intravenous Every 12 hours 11/05/23 1106 11/06/23 1046   11/05/23 0345  ceFEPIme (MAXIPIME) 2 g in sodium chloride 0.9 % 100 mL IVPB        2 g 200 mL/hr over 30 Minutes Intravenous  Once 11/05/23 0331 11/05/23 0424         Note: Portions of this report may have been transcribed using voice recognition software. Every effort was made to ensure accuracy; however, inadvertent computerized transcription errors may be present.   Any transcriptional errors that result from this process are unintentional.    Ardeth Sportsman, MD, FACS, MASCRS Esophageal, Gastrointestinal & Colorectal Surgery Robotic and Minimally Invasive Surgery  [facility] [address] [place], [place] [address] [phone] Fax [phone] Main  CONTACT INFORMATION: Weekday (9AM-5PM): Call [facility] main office at [phone] Weeknight (5PM-9AM) or Weekend/Holiday: Check  EPIC "Web Links" tab & use "AMION" (password " [email or web address]") for General Surgery [facility] coverage  Please, DO NOT use SecureChat  (it is not reliable communication to reach operating surgeons & will lead to a delay in care).   Epic staff messaging available for outptient concerns needing 1-2 business day response.      11/10/2023  9:47 AM

## 2023-11-10 NOTE — Plan of Care (Signed)
  Problem: Education: Goal: Ability to describe self-care measures that may prevent or decrease complications (Diabetes Survival Skills Education) will improve Outcome: Progressing Goal: Individualized Educational Video(s) Outcome: Progressing   Problem: Coping: Goal: Ability to adjust to condition or change in health will improve Outcome: Progressing   Problem: Fluid Volume: Goal: Ability to maintain a balanced intake and output will improve Outcome: Progressing   Problem: Health Behavior/Discharge Planning: Goal: Ability to identify and utilize available resources and services will improve Outcome: Progressing Goal: Ability to manage health-related needs will improve Outcome: Progressing   Problem: Metabolic: Goal: Ability to maintain appropriate glucose levels will improve Outcome: Progressing   Problem: Nutritional: Goal: Maintenance of adequate nutrition will improve Outcome: Progressing Goal: Progress toward achieving an optimal weight will improve Outcome: Progressing   Problem: Skin Integrity: Goal: Risk for impaired skin integrity will decrease Outcome: Progressing   Problem: Education: Goal: Knowledge of General Education information will improve Description: Including pain rating scale, medication(s)/side effects and non-pharmacologic comfort measures Outcome: Progressing   Problem: Health Behavior/Discharge Planning: Goal: Ability to manage health-related needs will improve Outcome: Progressing   Problem: Clinical Measurements: Goal: Ability to maintain clinical measurements within normal limits will improve Outcome: Progressing Goal: Will remain free from infection Outcome: Progressing Goal: Diagnostic test results will improve Outcome: Progressing Goal: Respiratory complications will improve Outcome: Progressing Goal: Cardiovascular complication will be avoided Outcome: Progressing   Problem: Activity: Goal: Risk for activity intolerance will  decrease Outcome: Progressing   Problem: Nutrition: Goal: Adequate nutrition will be maintained Outcome: Progressing   Problem: Coping: Goal: Level of anxiety will decrease Outcome: Progressing   Problem: Elimination: Goal: Will not experience complications related to bowel motility Outcome: Progressing Goal: Will not experience complications related to urinary retention Outcome: Progressing   Problem: Pain Managment: Goal: General experience of comfort will improve and/or be controlled Outcome: Progressing   Problem: Safety: Goal: Ability to remain free from injury will improve Outcome: Progressing   Problem: Skin Integrity: Goal: Risk for impaired skin integrity will decrease Outcome: Progressing   Problem: Respiratory: Goal: Ability to maintain a clear airway and adequate ventilation will improve Outcome: Progressing   Problem: Role Relationship: Goal: Method of communication will improve Outcome: Progressing

## 2023-11-10 NOTE — Progress Notes (Signed)
 Patient ate 100% of dysphagia 1 diet with no complications for dinner.  Progressed diet per verbal communication with Dr. Michaell Cowing.

## 2023-11-10 NOTE — Evaluation (Signed)
 Clinical/Bedside Swallow Evaluation Patient Details  Name: Benjamin Hensley MRN: [id number] Date of Birth: 03/26/1941  Today's Date: 11/10/2023 Time: SLP Start Time (ACUTE ONLY): 1405 SLP Stop Time (ACUTE ONLY): 1435 SLP Time Calculation (min) (ACUTE ONLY): 30 min  Past Medical History:  Past Medical History:  Diagnosis Date   Bladder cancer (HCC)    Chronic kidney disease    Dementia (HCC)    History of kidney stones    Medical history non-contributory    Meningitis    hx of as a child affected right eye   Past Surgical History:  Past Surgical History:  Procedure Laterality Date   ciliodional cyst  yrs ago   COLONOSCOPY WITH PROPOFOL N/A 02/26/2017   Procedure: COLONOSCOPY WITH PROPOFOL;  Surgeon: Charolett Bumpers, MD;  Location: [facility];  Service: Endoscopy;  Laterality: N/A;   colonscopy  2008   HERNIA REPAIR     LAPAROTOMY N/A 11/05/2023   Procedure: EXPLORATORY LAPAROTOMY; lysis of adhesions; small bowel resection; ligation of left ureter; primary hernia repair;  Surgeon: Griselda Miner, MD;  Location: [facility];  Service: General;  Laterality: N/A;   LYMPH NODE DISSECTION Bilateral 09/22/2021   Procedure: LYMPH NODE DISSECTION;  Surgeon: Sebastian Ache, MD;  Location: [facility];  Service: Urology;  Laterality: Bilateral;   NEPHROLITHOTOMY Right 03/30/2022   Procedure: NEPHROLITHOTOMY PERCUTANEOUS;  Surgeon: Sebastian Ache, MD;  Location: [facility];  Service: Urology;  Laterality: Right;  2 HRS   NEPHROLITHOTOMY Right 03/31/2022   Procedure: RIGHT ANTEGRADE NEPHROGRAM; NEPHROSTOMY TUBE PLACEMENT;  Surgeon: Sebastian Ache, MD;  Location: [facility];  Service: Urology;  Laterality: Right;   ROBOT ASSISTED LAPAROSCOPIC COMPLETE CYSTECT ILEAL CONDUIT N/A 09/22/2021   Procedure: XI ROBOTIC ASSISTED LAPAROSCOPIC COMPLETE CYSTECT ILEAL CONDUIT WITH INDOCYANINE GREEN DYE;  Surgeon: Sebastian Ache, MD;  Location: [facility];  Service: Urology;  Laterality: N/A;  6 HRS   ROBOT ASSISTED LAPAROSCOPIC  RADICAL PROSTATECTOMY N/A 09/22/2021   Procedure: XI ROBOTIC ASSISTED LAPAROSCOPIC RADICAL PROSTATECTOMY;  Surgeon: Sebastian Ache, MD;  Location: [facility];  Service: Urology;  Laterality: N/A;   TONSILLECTOMY  as child   TRANSURETHRAL RESECTION OF BLADDER TUMOR WITH MITOMYCIN-C N/A 05/02/2021   Procedure: TRANSURETHRAL RESECTION OF BLADDER TUMOR WITH GEMCITABINE;  Surgeon: Bjorn Pippin, MD;  Location: [facility];  Service: Urology;  Laterality: N/A;  GENERAL ANESTHESIA WITH PARALYSIS   UMBILICAL HERNIA REPAIR N/A 09/22/2021   Procedure: OPEN HERNIA REPAIR UMBILICAL ADULT;  Surgeon: Sebastian Ache, MD;  Location: [facility];  Service: Urology;  Laterality: N/A;   HPI:  Pt is an 83 yo male adm to [facility] with abdominal pain. Pt required emergent bowel surgery.  PMH + for bladder cancer, SBO 2023, HLD, falls, He has a JP drain and NG tube in place.  Surgery ordered swallow eval with hopes to advance diet and remove NG. Hospital coarse complicated by delirium and encephalopathy.  Most recent CXR showed Improved bibasilar airspace opacities remain, right greater than  left. A small right pleural effusion is suspected.    Assessment / Plan / Recommendation  Clinical Impression  Patient presents with functional oropharyngeal swallow ability.  No focal CN deficits apparent. Swallow was minimally delayed with pt benefiting from occasional cue to swallow. He may have slight sensory deficit from NG tube.  He demonstrated delayed cough after several boluses of thin water - but suspect this is only NG tube related given he has no baseline dysphagia.  Provided pt with jello, ice chips and water in  small amounts due to his GI issues.    Recommend advance diet with NG tube removed.   SlP will follow up x1 to assure pt is managing po diet, especially in light of subtle minimal cough after water. SLP Visit Diagnosis: Dysphagia, unspecified (R13.10)    Aspiration Risk  Mild aspiration risk    Diet Recommendation  (diet per  surgery)    Liquid Administration via: Cup;No straw Medication Administration: Whole meds with liquid Supervision: Patient able to self feed Compensations: Slow rate;Small sips/bites Postural Changes: Seated upright at 90 degrees;Remain upright for at least 30 minutes after po intake    Other  Recommendations Oral Care Recommendations: Oral care BID    Recommendations for follow up therapy are one component of a multi-disciplinary discharge planning process, led by the attending physician.  Recommendations may be updated based on patient status, additional functional criteria and insurance authorization.  Follow up Recommendations    N/a    Assistance Recommended at Discharge  N/a  Functional Status Assessment Patient has had a recent decline in their functional status and demonstrates the ability to make significant improvements in function in a reasonable and predictable amount of time.  Frequency and Duration min 1 x/week          Prognosis Prognosis for improved oropharyngeal function: Good      Swallow Study   General Date of Onset: 11/05/23 HPI: Pt is an 83 yo male adm to [facility] with abdominal pain. Pt required emergent bowel surgery.  PMH + for bladder cancer, SBO 2023, HLD, falls, He has a JP drain and NG tube in place.  Surgery ordered swallow eval with hopes to advance diet and remove NG. Hospital coarse complicated by delirium and encephalopathy.  Most recent CXR showed Improved bibasilar airspace opacities remain, right greater than  left. A small right pleural effusion is suspected. Type of Study: Bedside Swallow Evaluation Diet Prior to this Study: NPO;Large bore NG tube;Other (Comment) (ice chips) Temperature Spikes Noted: No Respiratory Status: Nasal cannula History of Recent Intubation: No Behavior/Cognition: Alert;Cooperative;Pleasant mood Oral Cavity Assessment: Within Functional Limits Oral Care Completed by SLP: No Oral Cavity - Dentition: Adequate natural  dentition Vision: Functional for self-feeding Self-Feeding Abilities: Able to feed self;Other (Comment) (with assist to manipulate his feeding tube) Patient Positioning: Upright in bed Baseline Vocal Quality: Normal Volitional Cough: Weak Volitional Swallow: Able to elicit    Oral/Motor/Sensory Function Overall Oral Motor/Sensory Function: Within functional limits   Ice Chips Ice chips: Within functional limits Presentation: Spoon Other Comments: slight delayed swallow   Thin Liquid Thin Liquid: Within functional limits Presentation: Cup Other Comments: delayed subtle cough noted after intake-    Nectar Thick Nectar Thick Liquid: Not tested   Honey Thick Honey Thick Liquid: Not tested   Puree Puree: Within functional limits Presentation: Self Fed;Spoon   Solid     Solid: Not tested      Chales Abrahams 11/10/2023,2:59 PM  Rolena Infante, MS [facility] SLP Acute Rehab Services Office [phone]

## 2023-11-10 NOTE — Progress Notes (Signed)
 PROGRESS NOTE    Benjamin Hensley  ZOX:[id number] DOB: 04/01/1941 DOA: 11/04/2023 PCP: Thana Ates, MD    Brief Narrative:   Benjamin Hensley is a 83 y.o. male with past medical history significant for SBO (s/p ex-lap/SBR 02/2022), aggressive bladder/prostate CA requiring ileal conduit (followed by Urology - Dr. 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  In the ED, Tmax 103.65F, tachycardic to 100s, tachypneic to 20s-low 30s, BP 150s/80s. Labs were notable for WBC 8.3, Hgb 14.1, Plt 146. Na 131, K 4.3, CO2 21, Cr 2.71 (baseline variable), LFTs WNL. Trop unremarkable. UA with small Hgb, 100 protein, large leuks, rare bacteria. Blood Cx and Urine Cx pending.  CT abdomen/pelvis with right lower quadrant ileal conduit that is decompressed with bilateral hydronephrosis/hydroureter, left renal atrophy, concern for small bowel obstruction with possible vascular compromise causing small bowel wall thickening appears to be localized in the right lower quadrant with concern of adhesions.  Empiric broad-spectrum antibiotics (cefepime/Flagyl) started.  EDP discussed with urology and general surgery will see in consultation.  Hospitalist service consulted for admission.  Significant Hospital events: 2/24: Admit to [facility], [facility], urology consulted; started on broad-spectrum antibiotics 2/25: Persistently febrile with soft BP, rising LA to 7.1. PCCM consulted. Taken to OR for ex-lap; found to have closed-loop SBO with necrosis (resected) 2/2 tight band (nonfunctional L ureter) which was ligated. Taken to ICU postoperatively on 2 vasopressors and intubated. LIJ CVC placed for access. 2/26: Sedation weaned, extubated. 2/27: POD#2 from ex-lap/LOA and L ureteral  ligation. Remains encephalopathic and uncomfortable. Bicarb gtt discontinued. Off of Levophed. Urostomy with increased mucous/purulent material/ Plt 49, held Lovenox 2/28: Encephalopathy improving, right IJ catheter removed, creatinine improved to 2.6.  Transferred to progressive unit 3/1: Transferred to [facility]; tube feeds increased to 30 mL/h; stable; downgrade to med/surg 3/2: per CCS, ok to increase tube feeds to goal, SLP eval for swallow study   Assessment & Plan:   Small bowel obstruction with necrosis s/p Exlap, LOA, SBR Patient presenting to the ED with acute onset abdominal pain.  Temperature 103.5, tachycardic, tachypneic.  CT abdomen/pelvis notable for SBO with possible vascular compromise.  General surgery was consulted and patient underwent exploratory laparotomy with lysis of adhesions, small bowel resection, primary hernia repair on 11/05/2023 by Dr. Carolynne Edouard.  -- General Surgery following, appreciate assistance -- Ceftriaxone 2 g IV every 24 hours -- Tubes feeds; advance to goal today per CCS -- SLP evaluation for swallow study today; hopeful to transition to oral diet -- Lidocaine patch -- Fentanyl 25-50 mg IV every 2 hours as needed moderate pain -- General Surgery plans to start wound VAC to abdominal wound hopefully tomorrow -- Further per general surgery  Septic shock Klebsiella pneumonia UTI/bacteremia Urine culture and blood cultures positive for Klebsiella pneumonia with resistance to ampicillin. -- Continue ceftriaxone 2 g IV q24h; plan 10-day course (day #7)  Acute metabolic encephalopathy: Improving Etiology likely secondary to critical illness, sepsis secondary to bowel obstruction with necrosis as above.  Postoperatively required vasopressor support, mechanical ventilation; which now has been discontinued.  -- Supportive care  Acute renal failure on CKD stage IV History of bladder/prostate cancer s/p ileal conduit History of recurrent pyelonephritis s/p  cystectomy Chronic left hydronephrotic kidney Patient follows with urology outpatient, Dr. Berneice Heinrich.  Does  not follow with nephrology outpatient.  Underwent cystoprostatectomy with conduit diversion initially  January 2023.  Patient underwent left ureter ligation and repair of ileal conduit by Dr. Alvester Morin on 11/05/2023.  -- Nephrology following, appreciate assistance -- Cr 2.71>>4.85>>2.63>2.55>2.31 -- BMP daily  Transaminitis -- AST 21>>81>149 -- ALT 19>45>127 -- Total bilirubin 0.8>>0.5>0.5 -- Discontinue scheduled Tylenol today -- Repeat CMP in a.m.  Hypernatremia -- Na 153 today -- Start D5W at 50 mL/h -- Repeat CMP in a.m.  Hypokalemia Repleted -- Repeat electrolytes in a.m.  Hypophosphatemia Repleted -- Repeat electrolytes in a.m.  Hyperlipidemia -- Hold home atorvastatin  Weakness/debility/deconditioning: -- PT/OT recommend SNF placement, TOC consulted  DVT prophylaxis: SCDs Start: 11/05/23 1125    Code Status: Full Code Family Communication: No family present at bedside this morning  Disposition Plan:  Level of care: Med-Surg Status is: Inpatient Remains inpatient appropriate because: IV antibiotics, remains on tube feeds; will need SNF placement    Consultants:  Urology General surgery Nephrology PCCM -signed off 11/09/2023  Procedures:  Exploratory laparotomy, LOA, SBR, ligation left ureter, primary hernia repair; Dr. Carolynne Edouard, Dr. Fredricka Bonine, Dr. Alvester Morin, Dr. Berneice Heinrich; 11/05/2023  Antimicrobials:  Cefepime 2/24 - 2/25 Metronidazole 2/25 - 2/26 Ceftriaxone 2/26>>   Subjective: Patient seen examined bedside, lying in bed.  Lying in bed, sleeping but easily arousable.  Alert and oriented.  Seen by general surgery, advancing tube feeds to goal.  Okay for SLP evaluation for hopeful transition to oral diet soon.  Complaining of some mild abdominal discomfort surrounding his surgical site.    No family present at bedside this morning.  Denies headache, no chest pain, no  palpitations, no shortness of breath, no fever/chills, no nausea/vomiting, no diarrhea.  No acute events overnight per nursing.   Objective: Vitals:   11/09/23 2010 11/10/23 0255 11/10/23 0714 11/10/23 0859  BP: 135/68 (!) 145/78  126/65  Pulse: 73 81  72  Resp: 14 14  16   Temp: 98.7 F (37.1 C) 98.5 F (36.9 C)  98.4 F (36.9 C)  TempSrc:    Axillary  SpO2: 94% 94%  96%  Weight:   83.4 kg   Height:        Intake/Output Summary (Last 24 hours) at 11/10/2023 1306 Last data filed at 11/10/2023 1011 Gross per 24 hour  Intake 1183.64 ml  Output 1370 ml  Net -186.36 ml   Filed Weights   11/08/23 0500 11/09/23 0500 11/10/23 0714  Weight: 70.8 kg 85.9 kg 83.4 kg    Examination:  Physical Exam: GEN: NAD, alert and oriented x 3, ill in appearance HEENT: NCAT, PERRL, EOMI, sclera clear, MMM PULM: CTAB w/o wheezes/crackles, normal respiratory effort, on room air with SpO2 93% CV: RRR w/o M/G/R GI: abd soft, slight tenderness to palpation surrounding surgical wound, dressing noted clean/dry/intact,  + BS, abdominal drain noted  GU: Urostomy noted with yellow urine in collection bag MSK: no peripheral edema, moves all EXTR independently NEURO: No focal neurological deficits PSYCH: normal mood/affect Integumentary: Urostomy and abdominal surgical incision site noted, otherwise no other concerning rashes/lesions/wounds noted on exposed skin surfaces    Data Reviewed: I have personally reviewed following labs and imaging studies  CBC: Recent Labs  Lab 11/04/23 1931 11/05/23 0537 11/06/23 0314 11/07/23 0516 11/08/23 0535 11/09/23 0446 11/10/23 0751  WBC 8.3   < > 29.8* 13.1* 9.6 8.0 8.4  NEUTROABS 6.9  --   --  12.6*  --   --   --   HGB 14.1   < >  10.4* 9.4* 9.1* 9.4* 10.1*  HCT 42.7   < > 31.9* 29.0* 28.8* 30.2* 34.0*  MCV 85.6   < > 86.9 86.1 88.1 89.9 91.9  PLT 146*   < > 75* 49* 53* 68* 93*   < > = values in this interval not displayed.   Basic Metabolic  Panel: Recent Labs  Lab 11/06/23 0314 11/07/23 0516 11/08/23 0535 11/08/23 1603 11/09/23 0446 11/09/23 1718 11/10/23 0751  NA 136 142 145  --  148*  --  153*  K 4.2 3.7 3.6  --  3.8  --  4.5  CL 105 110 114*  --  116*  --  121*  CO2 20* 23 25  --  22  --  23  GLUCOSE 156* 95 120*  --  117*  --  136*  BUN 59* 57* 49*  --  51*  --  51*  CREATININE 3.66* 3.75* 2.63*  --  2.55*  --  2.31*  CALCIUM 7.2* 7.1* 7.6*  --  8.1*  --  8.4*  MG 1.7 1.8 2.0 2.1 2.3 2.5* 2.5*  PHOS 4.1 3.6 2.2* 2.6 2.4* 2.6 2.8   GFR: Estimated Creatinine Clearance: 27.2 mL/min (A) (by C-G formula based on SCr of 2.31 mg/dL (H)). Liver Function Tests: Recent Labs  Lab 11/04/23 1931 11/06/23 0314 11/08/23 0535 11/09/23 0446 11/10/23 0751  AST 21 27 27  81* 149*  ALT 19 16 20  45* 127*  ALKPHOS 58 39 69 256* 352*  BILITOT 0.8 0.8 0.5 0.5 0.5  PROT 7.3 5.3* 5.0* 5.5* 5.7*  ALBUMIN 3.7 2.6* 2.1* 2.4* 2.5*   No results for input(s): "LIPASE", "AMYLASE" in the last 168 hours. No results for input(s): "AMMONIA" in the last 168 hours. Coagulation Profile: Recent Labs  Lab 11/05/23 1628 11/06/23 0314 11/09/23 0446  INR 1.6* 1.8* 1.1   Cardiac Enzymes: No results for input(s): "CKTOTAL", "CKMB", "CKMBINDEX", "TROPONINI" in the last 168 hours. BNP (last 3 results) No results for input(s): "PROBNP" in the last 8760 hours. HbA1C: No results for input(s): "HGBA1C" in the last 72 hours. CBG: Recent Labs  Lab 11/09/23 2011 11/09/23 2355 11/10/23 0423 11/10/23 0738 11/10/23 1128  GLUCAP 130* 131* 114* 122* 126*   Lipid Profile: No results for input(s): "CHOL", "HDL", "LDLCALC", "TRIG", "CHOLHDL", "LDLDIRECT" in the last 72 hours. Thyroid Function Tests: No results for input(s): "TSH", "T4TOTAL", "FREET4", "T3FREE", "THYROIDAB" in the last 72 hours. Anemia Panel: No results for input(s): "VITAMINB12", "FOLATE", "FERRITIN", "TIBC", "IRON", "RETICCTPCT" in the last 72 hours. Sepsis Labs: Recent Labs   Lab 11/05/23 1015 11/05/23 1628 11/07/23 0824  LATICACIDVEN 7.1* 3.0* 1.3    Recent Results (from the past 240 hours)  Urine Culture (for pregnant, neutropenic or urologic patients or patients with an indwelling urinary catheter)     Status: Abnormal   Collection Time: 11/05/23  1:02 AM   Specimen: Urine, Clean Catch  Result Value Ref Range Status   Specimen Description   Final    URINE, CLEAN CATCH Performed at [facility], [address], [place], [place] [address]    Special Requests   Final    Immunocompromised Performed at [facility], [address] [place], [place] [address]    Culture >=100,000 COLONIES/mL KLEBSIELLA PNEUMONIAE (A)  Final   Report Status 11/07/2023 FINAL  Final   Organism ID, Bacteria KLEBSIELLA PNEUMONIAE (A)  Final      Susceptibility   Klebsiella pneumoniae - MIC*    AMPICILLIN RESISTANT  Resistant     CEFAZOLIN <=4 SENSITIVE Sensitive     CEFEPIME <=0.12 SENSITIVE Sensitive     CEFTRIAXONE <=0.25 SENSITIVE Sensitive     CIPROFLOXACIN <=0.25 SENSITIVE Sensitive     GENTAMICIN <=1 SENSITIVE Sensitive     IMIPENEM <=0.25 SENSITIVE Sensitive     NITROFURANTOIN <=16 SENSITIVE Sensitive     TRIMETH/SULFA <=20 SENSITIVE Sensitive     AMPICILLIN/SULBACTAM <=2 SENSITIVE Sensitive     PIP/TAZO <=4 SENSITIVE Sensitive ug/mL    * >=100,000 COLONIES/mL KLEBSIELLA PNEUMONIAE  Culture, blood (Routine X 2) w Reflex to ID Panel     Status: Abnormal   Collection Time: 11/05/23  1:15 AM   Specimen: BLOOD  Result Value Ref Range Status   Specimen Description   Final    BLOOD RIGHT ANTECUBITAL Performed at [facility], [address], [place], [place] [address]    Special Requests   Final    BOTTLES DRAWN AEROBIC AND ANAEROBIC Blood Culture results may not be optimal due to an inadequate volume of blood received in culture bottles Performed at [facility], [address],  [place], [place] [address]    Culture  Setup Time   Final    GRAM NEGATIVE RODS IN BOTH AEROBIC AND ANAEROBIC BOTTLES CRITICAL RESULT CALLED TO, READ BACK BY AND VERIFIED WITH: PHARMD MARY SWAYNE ON 11/05/23 @ 1415 BY DRT Performed at [facility], [address], [place], [place] [address]    Culture KLEBSIELLA PNEUMONIAE (A)  Final   Report Status 11/07/2023 FINAL  Final   Organism ID, Bacteria KLEBSIELLA PNEUMONIAE  Final   Organism ID, Bacteria KLEBSIELLA PNEUMONIAE  Final      Susceptibility   Klebsiella pneumoniae - KIRBY BAUER*    CEFAZOLIN SENSITIVE Sensitive    Klebsiella pneumoniae - MIC*    AMPICILLIN RESISTANT Resistant     CEFEPIME <=0.12 SENSITIVE Sensitive     CEFTAZIDIME <=1 SENSITIVE Sensitive     CEFTRIAXONE <=0.25 SENSITIVE Sensitive     CIPROFLOXACIN <=0.25 SENSITIVE Sensitive     GENTAMICIN <=1 SENSITIVE Sensitive     IMIPENEM <=0.25 SENSITIVE Sensitive     TRIMETH/SULFA <=20 SENSITIVE Sensitive     AMPICILLIN/SULBACTAM <=2 SENSITIVE Sensitive     PIP/TAZO <=4 SENSITIVE Sensitive ug/mL    * KLEBSIELLA PNEUMONIAE    KLEBSIELLA PNEUMONIAE  Blood Culture ID Panel (Reflexed)     Status: Abnormal   Collection Time: 11/05/23  1:15 AM  Result Value Ref Range Status   Enterococcus faecalis NOT DETECTED NOT DETECTED Final   Enterococcus Faecium NOT DETECTED NOT DETECTED Final   Listeria monocytogenes NOT DETECTED NOT DETECTED Final   Staphylococcus species NOT DETECTED NOT DETECTED Final   Staphylococcus aureus (BCID) NOT DETECTED NOT DETECTED Final   Staphylococcus epidermidis NOT DETECTED NOT DETECTED Final   Staphylococcus lugdunensis NOT DETECTED NOT DETECTED Final   Streptococcus species NOT DETECTED NOT DETECTED Final   Streptococcus agalactiae NOT DETECTED NOT DETECTED Final   Streptococcus pneumoniae NOT DETECTED NOT DETECTED Final   Streptococcus pyogenes NOT DETECTED NOT DETECTED Final   A.calcoaceticus-baumannii NOT DETECTED NOT DETECTED Final    Bacteroides fragilis NOT DETECTED NOT DETECTED Final   Enterobacterales DETECTED (A) NOT DETECTED Final    Comment: Enterobacterales represent a large order of gram negative bacteria, not a single organism. CRITICAL RESULT CALLED TO, READ BACK BY AND VERIFIED WITH: PHARMD MARY SWAYNE ON 11/05/23 @ 1415 BY DRT    Enterobacter cloacae complex NOT DETECTED  NOT DETECTED Final   Escherichia coli NOT DETECTED NOT DETECTED Final   Klebsiella aerogenes NOT DETECTED NOT DETECTED Final   Klebsiella oxytoca NOT DETECTED NOT DETECTED Final   Klebsiella pneumoniae DETECTED (A) NOT DETECTED Final    Comment: CRITICAL RESULT CALLED TO, READ BACK BY AND VERIFIED WITH: PHARMD MARY SWAYNE ON 11/05/23 @ 1415 BY DRT    Proteus species NOT DETECTED NOT DETECTED Final   Salmonella species NOT DETECTED NOT DETECTED Final   Serratia marcescens NOT DETECTED NOT DETECTED Final   Haemophilus influenzae NOT DETECTED NOT DETECTED Final   Neisseria meningitidis NOT DETECTED NOT DETECTED Final   Pseudomonas aeruginosa NOT DETECTED NOT DETECTED Final   Stenotrophomonas maltophilia NOT DETECTED NOT DETECTED Final   Candida albicans NOT DETECTED NOT DETECTED Final   Candida auris NOT DETECTED NOT DETECTED Final   Candida glabrata NOT DETECTED NOT DETECTED Final   Candida krusei NOT DETECTED NOT DETECTED Final   Candida parapsilosis NOT DETECTED NOT DETECTED Final   Candida tropicalis NOT DETECTED NOT DETECTED Final   Cryptococcus neoformans/gattii NOT DETECTED NOT DETECTED Final   CTX-M ESBL NOT DETECTED NOT DETECTED Final   Carbapenem resistance IMP NOT DETECTED NOT DETECTED Final   Carbapenem resistance KPC NOT DETECTED NOT DETECTED Final   Carbapenem resistance NDM NOT DETECTED NOT DETECTED Final   Carbapenem resist OXA 48 LIKE NOT DETECTED NOT DETECTED Final   Carbapenem resistance VIM NOT DETECTED NOT DETECTED Final    Comment: Performed at [facility], [address] [place], [place] [address]  Culture,  blood (Routine X 2) w Reflex to ID Panel     Status: Abnormal   Collection Time: 11/05/23  1:16 AM   Specimen: BLOOD RIGHT HAND  Result Value Ref Range Status   Specimen Description   Final    BLOOD RIGHT HAND Performed at [facility], [address], [place], [place] [address]    Special Requests   Final    BOTTLES DRAWN AEROBIC AND ANAEROBIC Blood Culture results may not be optimal due to an inadequate volume of blood received in culture bottles Performed at [facility], [address] [place], [place] [address]    Culture  Setup Time   Final    GRAM NEGATIVE RODS AEROBIC BOTTLE ONLY CRITICAL VALUE NOTED.  VALUE IS CONSISTENT WITH PREVIOUSLY REPORTED AND CALLED VALUE.    Culture (A)  Final    KLEBSIELLA PNEUMONIAE SUSCEPTIBILITIES PERFORMED ON PREVIOUS CULTURE WITHIN THE LAST 5 DAYS. Performed at [facility], [address] [place], [place] [address]    Report Status 11/07/2023 FINAL  Final  MRSA Next Gen by PCR, Nasal     Status: None   Collection Time: 11/05/23  5:31 PM   Specimen: Nasal Mucosa; Nasal Swab  Result Value Ref Range Status   MRSA by PCR Next Gen NOT DETECTED NOT DETECTED Final    Comment: (NOTE) The GeneXpert MRSA Assay (FDA approved for NASAL specimens only), is one component of a comprehensive MRSA colonization surveillance program. It is not intended to diagnose MRSA infection nor to guide or monitor treatment for MRSA infections. Test performance is not FDA approved in patients less than 79 years old. Performed at [facility], [address] [place], [place] [address]   Resp panel by RT-PCR (RSV, Flu A&B, Covid) Anterior Nasal Swab     Status: None   Collection Time: 11/05/23  6:47 PM   Specimen: Anterior Nasal  Swab  Result Value Ref Range Status   SARS Coronavirus 2 by RT PCR NEGATIVE NEGATIVE Final    Comment: (NOTE) SARS-CoV-2 target nucleic acids are NOT DETECTED.  The SARS-CoV-2 RNA is  generally detectable in upper respiratory specimens during the acute phase of infection. The lowest concentration of SARS-CoV-2 viral copies this assay can detect is 138 copies/mL. A negative result does not preclude SARS-Cov-2 infection and should not be used as the sole basis for treatment or other patient management decisions. A negative result may occur with  improper specimen collection/handling, submission of specimen other than nasopharyngeal swab, presence of viral mutation(s) within the areas targeted by this assay, and inadequate number of viral copies(<138 copies/mL). A negative result must be combined with clinical observations, patient history, and epidemiological information. The expected result is Negative.  Fact Sheet for Patients:  BloggerCourse.com  Fact Sheet for Healthcare Providers:  SeriousBroker.it  This test is no t yet approved or cleared by the [place] FDA and  has been authorized for detection and/or diagnosis of SARS-CoV-2 by FDA under an Emergency Use Authorization (EUA). This EUA will remain  in effect (meaning this test can be used) for the duration of the COVID-19 declaration under Section 564(b)(1) of the Act, 21 U.S.C.section 360bbb-3(b)(1), unless the authorization is terminated  or revoked sooner.       Influenza A by PCR NEGATIVE NEGATIVE Final   Influenza B by PCR NEGATIVE NEGATIVE Final    Comment: (NOTE) The Xpert Xpress SARS-CoV-2/FLU/RSV plus assay is intended as an aid in the diagnosis of influenza from Nasopharyngeal swab specimens and should not be used as a sole basis for treatment. Nasal washings and aspirates are unacceptable for Xpert Xpress SARS-CoV-2/FLU/RSV testing.  Fact Sheet for Patients: BloggerCourse.com  Fact Sheet for Healthcare Providers: SeriousBroker.it  This test is not yet approved or cleared by the [place] FDA and has been authorized for detection and/or diagnosis of SARS-CoV-2 by FDA under an Emergency Use Authorization (EUA). This EUA will remain in effect (meaning this test can be used) for the duration of the COVID-19 declaration under Section 564(b)(1) of the Act, 21 U.S.C. section 360bbb-3(b)(1), unless the authorization is terminated or revoked.     Resp Syncytial Virus by PCR NEGATIVE NEGATIVE Final    Comment: (NOTE) Fact Sheet for Patients: BloggerCourse.com  Fact Sheet for Healthcare Providers: SeriousBroker.it  This test is not yet approved or cleared by the [place] FDA and has been authorized for detection and/or diagnosis of SARS-CoV-2 by FDA under an Emergency Use Authorization (EUA). This EUA will remain in effect (meaning this test can be used) for the duration of the COVID-19 declaration under Section 564(b)(1) of the Act, 21 U.S.C. section 360bbb-3(b)(1), unless the authorization is terminated or revoked.  Performed at [facility], [address], [place], [place] [address]          Radiology Studies: DG CHEST PORT 1 VIEW Result Date: 11/09/2023 CLINICAL DATA:  Acute hypoxemic respiratory failure. EXAM: PORTABLE CHEST 1 VIEW COMPARISON:  One-view chest x-ray 11/06/2023 FINDINGS: The patient has been extubated. Gastric tube courses off the inferior border of the film. Previously noted left IJ line was removed. Aeration of both lungs is improved. Lung volumes remain low. Improved bibasilar airspace opacities remain, right greater than left. A small right pleural effusion is suspected. IMPRESSION: 1. Interval extubation. 2. Improved aeration of both lungs. 3. Improved bibasilar airspace disease, ill-defined opacities remain, right greater than left. 4. Small right  pleural effusion. Electronically Signed   By: Marin Roberts M.D.   On: 11/09/2023 11:53        Scheduled  Meds:  acetaminophen  1,000 mg Oral Q6H   Chlorhexidine Gluconate Cloth  6 each Topical Q2200   insulin aspart  0-9 Units Subcutaneous Q4H   lidocaine  1 patch Transdermal Q24H   pantoprazole (PROTONIX) IV  40 mg Intravenous Daily   Continuous Infusions:  cefTRIAXone (ROCEPHIN)  IV 200 mL/hr at 11/10/23 1011   feeding supplement (OSMOLITE 1.5 CAL) 55 mL/hr at 11/10/23 1101     LOS: 6 days    Time spent: 52 minutes spent on chart review, discussion with nursing staff, consultants, updating family and interview/physical exam; more than 50% of that time was spent in counseling and/or coordination of care.    Alvira Philips [place], DO [facility] Available via Epic secure chat 7am-7pm After these hours, please refer to coverage provider listed on [email or web address] 11/10/2023, 1:06 PM

## 2023-11-10 NOTE — Progress Notes (Signed)
 Evaluated by speech therapy.  They feel no risk of aspiration and cleared the patient  Okay to remove nasogastric tube.  Will start with with dysphagia 1 diet just in case and advance to mechanically softened diet Dys3 as tolerated.  If that goes well, then hopefully he can get to a heart healthy a regular diet in a day or so, provided he does not have worsening diarrhea or other concerns.

## 2023-11-11 DIAGNOSIS — R1084 Generalized abdominal pain: Secondary | ICD-10-CM | POA: Diagnosis not present

## 2023-11-11 LAB — COMPREHENSIVE METABOLIC PANEL
ALT: 107 U/L — ABNORMAL HIGH (ref 0–44)
AST: 88 U/L — ABNORMAL HIGH (ref 15–41)
Albumin: 2.4 g/dL — ABNORMAL LOW (ref 3.5–5.0)
Alkaline Phosphatase: 315 U/L — ABNORMAL HIGH (ref 38–126)
Anion gap: 7 (ref 5–15)
BUN: 45 mg/dL — ABNORMAL HIGH (ref 8–23)
CO2: 22 mmol/L (ref 22–32)
Calcium: 8 mg/dL — ABNORMAL LOW (ref 8.9–10.3)
Chloride: 117 mmol/L — ABNORMAL HIGH (ref 98–111)
Creatinine, Ser: 2.16 mg/dL — ABNORMAL HIGH (ref 0.61–1.24)
GFR, Estimated: 30 mL/min — ABNORMAL LOW (ref >60)
Glucose, Bld: 107 mg/dL — ABNORMAL HIGH (ref 70–99)
Potassium: 5.3 mmol/L — ABNORMAL HIGH (ref 3.5–5.1)
Sodium: 146 mmol/L — ABNORMAL HIGH (ref 135–145)
Total Bilirubin: 1 mg/dL (ref 0.0–1.2)
Total Protein: 5.3 g/dL — ABNORMAL LOW (ref 6.5–8.1)

## 2023-11-11 LAB — CBC
HCT: 32.4 % — ABNORMAL LOW (ref 39.0–52.0)
Hemoglobin: 10 g/dL — ABNORMAL LOW (ref 13.0–17.0)
MCH: 27.9 pg (ref 26.0–34.0)
MCHC: 30.9 g/dL (ref 30.0–36.0)
MCV: 90.3 fL (ref 80.0–100.0)
Platelets: 114 10*3/uL — ABNORMAL LOW (ref 150–400)
RBC: 3.59 MIL/uL — ABNORMAL LOW (ref 4.22–5.81)
RDW: 14.4 % (ref 11.5–15.5)
WBC: 9.7 10*3/uL (ref 4.0–10.5)
nRBC: 0.2 % (ref 0.0–0.2)

## 2023-11-11 LAB — GLUCOSE, CAPILLARY
Glucose-Capillary: 60 mg/dL — ABNORMAL LOW (ref 70–99)
Glucose-Capillary: 63 mg/dL — ABNORMAL LOW (ref 70–99)
Glucose-Capillary: 72 mg/dL (ref 70–99)
Glucose-Capillary: 75 mg/dL (ref 70–99)
Glucose-Capillary: 82 mg/dL (ref 70–99)
Glucose-Capillary: 82 mg/dL (ref 70–99)
Glucose-Capillary: 91 mg/dL (ref 70–99)
Glucose-Capillary: 96 mg/dL (ref 70–99)

## 2023-11-11 MED ORDER — OXYCODONE HCL 5 MG PO TABS: 5.0000 mg | ORAL_TABLET | ORAL | Status: DC | PRN

## 2023-11-11 MED ORDER — METHOCARBAMOL 500 MG PO TABS: 500.0000 mg | ORAL_TABLET | Freq: Four times a day (QID) | ORAL | Status: DC | PRN

## 2023-11-11 MED ORDER — DEXTROSE 5 % IV SOLN: INTRAVENOUS | Status: DC

## 2023-11-11 MED ORDER — ACETAMINOPHEN 325 MG PO TABS: 650.0000 mg | ORAL_TABLET | Freq: Four times a day (QID) | ORAL | Status: DC | PRN

## 2023-11-11 NOTE — Progress Notes (Signed)
 PROGRESS NOTE    TIMUR NIBERT  ZOX:[id number] DOB: 08/18/1941 DOA: 11/04/2023 PCP: Thana Ates, MD    Brief Narrative:   Benjamin Hensley is a 83 y.o. male with past medical history significant for SBO (s/p ex-lap/SBR 02/2022), aggressive bladder/prostate CA requiring ileal conduit (followed by Urology - Dr. 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  In the ED, Tmax 103.24F, tachycardic to 100s, tachypneic to 20s-low 30s, BP 150s/80s. Labs were notable for WBC 8.3, Hgb 14.1, Plt 146. Na 131, K 4.3, CO2 21, Cr 2.71 (baseline variable), LFTs WNL. Trop unremarkable. UA with small Hgb, 100 protein, large leuks, rare bacteria. Blood Cx and Urine Cx pending.  CT abdomen/pelvis with right lower quadrant ileal conduit that is decompressed with bilateral hydronephrosis/hydroureter, left renal atrophy, concern for small bowel obstruction with possible vascular compromise causing small bowel wall thickening appears to be localized in the right lower quadrant with concern of adhesions.  Empiric broad-spectrum antibiotics (cefepime/Flagyl) started.  EDP discussed with urology and general surgery will see in consultation.  Hospitalist service consulted for admission.  Significant Hospital events: 2/24: Admit to [facility], [facility], urology consulted; started on broad-spectrum antibiotics 2/25: Persistently febrile with soft BP, rising LA to 7.1. PCCM consulted. Taken to OR for ex-lap; found to have closed-loop SBO with necrosis (resected) 2/2 tight band (nonfunctional L ureter) which was ligated. Taken to ICU postoperatively on 2 vasopressors and intubated. LIJ CVC placed for access. 2/26: Sedation weaned, extubated. 2/27: POD#2 from ex-lap/LOA and L ureteral  ligation. Remains encephalopathic and uncomfortable. Bicarb gtt discontinued. Off of Levophed. Urostomy with increased mucous/purulent material/ Plt 49, held Lovenox 2/28: Encephalopathy improving, right IJ catheter removed, creatinine improved to 2.6.  Transferred to progressive unit 3/1: Transferred to [facility]; tube feeds increased to 30 mL/h; stable; downgrade to med/surg 3/2: per CCS, ok to increase tube feeds to goal, SLP eval for swallow study 3/3: NGT/tube feeds discontinued yesterday, started on diet, remains with poor oral intake, hypoglycemic started on D5W gtt   Assessment & Plan:   Small bowel obstruction with necrosis s/p Exlap, LOA, SBR Patient presenting to the ED with acute onset abdominal pain.  Temperature 103.5, tachycardic, tachypneic.  CT abdomen/pelvis notable for SBO with possible vascular compromise.  General surgery was consulted and patient underwent exploratory laparotomy with lysis of adhesions, small bowel resection, primary hernia repair on 11/05/2023 by Dr. Carolynne Edouard.  -- General Surgery following, appreciate assistance -- Oxycodone 5-10 mg PO q4h PRN moderate pain -- Lidocaine patch -- Fentanyl 25-50 mg IV q2h PRN severe pain -- Further per general surgery  Septic shock Klebsiella pneumonia UTI/bacteremia Urine culture and blood cultures positive for Klebsiella pneumonia with resistance to ampicillin. -- Continue ceftriaxone 2 g IV q24h; plan 10-day course (day #8)  Acute metabolic encephalopathy: Improving Etiology likely secondary to critical illness, sepsis secondary to bowel obstruction with necrosis as above.  Postoperatively required vasopressor support, mechanical ventilation; which now has been discontinued.  -- Supportive care  Acute renal failure on CKD stage IV History of bladder/prostate cancer s/p ileal conduit History of recurrent pyelonephritis s/p cystectomy Chronic left hydronephrotic kidney Patient follows with urology outpatient, Dr. Berneice Heinrich.  Does  not follow with nephrology outpatient.  Underwent cystoprostatectomy with conduit diversion initially  January 2023.  Patient underwent left  ureter ligation and repair of ileal conduit by Dr. Alvester Morin on 11/05/2023.  -- Nephrology following, appreciate assistance -- Cr 2.71>>4.85>>2.63>2.55>2.31>2.16 -- BMP daily  Hypoglycemia Patient transition to oral diet yesterday.  Poor intake.  Blood sugar 60 this morning.  Received orange juice.   -- D5W at 75 mL/h -- Monitor glucose closely  Transaminitis -- AST 21>>81>149>88 -- ALT 19>45>127>107 -- Total bilirubin 0.8>>0.5>0.5>1.0 -- Discontinued Tylenol -- Repeat CMP in a.m.  Hypernatremia -- Na 153 today -- Start D5W at 50 mL/h -- Repeat CMP in a.m.  Hypokalemia Repleted -- Repeat electrolytes in a.m.  Hypophosphatemia Repleted -- Repeat electrolytes in a.m.  Hyperlipidemia -- Hold home atorvastatin  Weakness/debility/deconditioning: -- PT/OT recommend SNF placement, TOC consulted  DVT prophylaxis: SCDs Start: 11/05/23 1125    Code Status: Full Code Family Communication: No family present at bedside this morning  Disposition Plan:  Level of care: Med-Surg Status is: Inpatient Remains inpatient appropriate because: IV antibiotics; will need SNF placement    Consultants:  Urology General surgery Nephrology PCCM -signed off 11/09/2023  Procedures:  Exploratory laparotomy, LOA, SBR, ligation left ureter, primary hernia repair; Dr. Carolynne Edouard, Dr. Fredricka Bonine, Dr. Alvester Morin, Dr. Berneice Heinrich; 11/05/2023  Antimicrobials:  Cefepime 2/24 - 2/25 Metronidazole 2/25 - 2/26 Ceftriaxone 2/26>>   Subjective: Patient seen examined bedside, lying in bed. Sleeping but easily arousable.  Alert and oriented.  NG tube and tube feeds discontinued yesterday, started on oral diet; although remains with poor oral intake.  Hypoglycemic this morning, increase D5W infusion.  Remains on IV antibiotics, day #8 of 10.   Denies headache, no chest pain, no palpitations,  no shortness of breath, no fever/chills, no nausea/vomiting, no diarrhea.  No acute events overnight per nursing.   Objective: Vitals:   11/10/23 1516 11/10/23 2004 11/11/23 0406 11/11/23 0500  BP: 127/67 131/68 138/73   Pulse: 77 86 74   Resp: 20 19 (!) 21   Temp: 97.7 F (36.5 C) 98.3 F (36.8 C) 98 F (36.7 C)   TempSrc: Axillary Oral Oral   SpO2: 96% 96% 96%   Weight:    86.5 kg  Height:        Intake/Output Summary (Last 24 hours) at 11/11/2023 1311 Last data filed at 11/11/2023 1610 Gross per 24 hour  Intake 1053.94 ml  Output 1720 ml  Net -666.06 ml   Filed Weights   11/09/23 0500 11/10/23 0714 11/11/23 0500  Weight: 85.9 kg 83.4 kg 86.5 kg    Examination:  Physical Exam: GEN: NAD, alert and oriented x 3, ill in appearance HEENT: NCAT, PERRL, EOMI, sclera clear, MMM PULM: CTAB w/o wheezes/crackles, normal respiratory effort, on room air with SpO2 93% CV: RRR w/o M/G/R GI: abd soft, slight tenderness to palpation surrounding surgical wound, dressing noted clean/dry/intact,  + BS, abdominal JP drain noted  GU: Urostomy noted with yellow urine in collection bag MSK: no peripheral edema, moves all extremities independently NEURO: No focal neurological deficits PSYCH: normal mood/affect Integumentary: Urostomy and abdominal surgical incision site noted, otherwise no other concerning rashes/lesions/wounds noted on exposed skin surfaces    Data Reviewed: I have personally reviewed following labs and imaging studies  CBC: Recent Labs  Lab 11/04/23 1931 11/05/23 0537 11/07/23 0516 11/08/23 0535 11/09/23 0446 11/10/23 0751 11/11/23 0400  WBC 8.3   < > 13.1* 9.6 8.0 8.4 9.7  NEUTROABS 6.9  --  12.6*  --   --   --   --   HGB 14.1   < > 9.4* 9.1*  9.4* 10.1* 10.0*  HCT 42.7   < > 29.0* 28.8* 30.2* 34.0* 32.4*  MCV 85.6   < > 86.1 88.1 89.9 91.9 90.3  PLT 146*   < > 49* 53* 68* 93* 114*   < > = values in this interval not displayed.   Basic Metabolic  Panel: Recent Labs  Lab 11/07/23 0516 11/08/23 0535 11/08/23 1603 11/09/23 0446 11/09/23 1718 11/10/23 0751 11/11/23 0400  NA 142 145  --  148*  --  153* 146*  K 3.7 3.6  --  3.8  --  4.5 5.3*  CL 110 114*  --  116*  --  121* 117*  CO2 23 25  --  22  --  23 22  GLUCOSE 95 120*  --  117*  --  136* 107*  BUN 57* 49*  --  51*  --  51* 45*  CREATININE 3.75* 2.63*  --  2.55*  --  2.31* 2.16*  CALCIUM 7.1* 7.6*  --  8.1*  --  8.4* 8.0*  MG 1.8 2.0 2.1 2.3 2.5* 2.5*  --   PHOS 3.6 2.2* 2.6 2.4* 2.6 2.8  --    GFR: Estimated Creatinine Clearance: 29.1 mL/min (A) (by C-G formula based on SCr of 2.16 mg/dL (H)). Liver Function Tests: Recent Labs  Lab 11/06/23 0314 11/08/23 0535 11/09/23 0446 11/10/23 0751 11/11/23 0400  AST 27 27 81* 149* 88*  ALT 16 20 45* 127* 107*  ALKPHOS 39 69 256* 352* 315*  BILITOT 0.8 0.5 0.5 0.5 1.0  PROT 5.3* 5.0* 5.5* 5.7* 5.3*  ALBUMIN 2.6* 2.1* 2.4* 2.5* 2.4*   No results for input(s): "LIPASE", "AMYLASE" in the last 168 hours. No results for input(s): "AMMONIA" in the last 168 hours. Coagulation Profile: Recent Labs  Lab 11/05/23 1628 11/06/23 0314 11/09/23 0446  INR 1.6* 1.8* 1.1   Cardiac Enzymes: No results for input(s): "CKTOTAL", "CKMB", "CKMBINDEX", "TROPONINI" in the last 168 hours. BNP (last 3 results) No results for input(s): "PROBNP" in the last 8760 hours. HbA1C: No results for input(s): "HGBA1C" in the last 72 hours. CBG: Recent Labs  Lab 11/11/23 0404 11/11/23 0742 11/11/23 1133 11/11/23 1154 11/11/23 1207  GLUCAP 96 82 63* 60* 82   Lipid Profile: No results for input(s): "CHOL", "HDL", "LDLCALC", "TRIG", "CHOLHDL", "LDLDIRECT" in the last 72 hours. Thyroid Function Tests: No results for input(s): "TSH", "T4TOTAL", "FREET4", "T3FREE", "THYROIDAB" in the last 72 hours. Anemia Panel: No results for input(s): "VITAMINB12", "FOLATE", "FERRITIN", "TIBC", "IRON", "RETICCTPCT" in the last 72 hours. Sepsis Labs: Recent  Labs  Lab 11/05/23 1015 11/05/23 1628 11/07/23 0824  LATICACIDVEN 7.1* 3.0* 1.3    Recent Results (from the past 240 hours)  Urine Culture (for pregnant, neutropenic or urologic patients or patients with an indwelling urinary catheter)     Status: Abnormal   Collection Time: 11/05/23  1:02 AM   Specimen: Urine, Clean Catch  Result Value Ref Range Status   Specimen Description   Final    URINE, CLEAN CATCH Performed at [facility], [address] [place], [place] [address]    Special Requests   Final    Immunocompromised Performed at [facility], [address], [place], [place] [address]    Culture >=100,000 COLONIES/mL KLEBSIELLA PNEUMONIAE (A)  Final   Report Status 11/07/2023 FINAL  Final   Organism ID, Bacteria KLEBSIELLA PNEUMONIAE (A)  Final      Susceptibility   Klebsiella pneumoniae - MIC*  AMPICILLIN RESISTANT Resistant     CEFAZOLIN <=4 SENSITIVE Sensitive     CEFEPIME <=0.12 SENSITIVE Sensitive     CEFTRIAXONE <=0.25 SENSITIVE Sensitive     CIPROFLOXACIN <=0.25 SENSITIVE Sensitive     GENTAMICIN <=1 SENSITIVE Sensitive     IMIPENEM <=0.25 SENSITIVE Sensitive     NITROFURANTOIN <=16 SENSITIVE Sensitive     TRIMETH/SULFA <=20 SENSITIVE Sensitive     AMPICILLIN/SULBACTAM <=2 SENSITIVE Sensitive     PIP/TAZO <=4 SENSITIVE Sensitive ug/mL    * >=100,000 COLONIES/mL KLEBSIELLA PNEUMONIAE  Culture, blood (Routine X 2) w Reflex to ID Panel     Status: Abnormal   Collection Time: 11/05/23  1:15 AM   Specimen: BLOOD  Result Value Ref Range Status   Specimen Description   Final    BLOOD RIGHT ANTECUBITAL Performed at [facility], [address], [place], [place] [address]    Special Requests   Final    BOTTLES DRAWN AEROBIC AND ANAEROBIC Blood Culture results may not be optimal due to an inadequate volume of blood received in culture bottles Performed at [facility], [address] [place], [place] [address]    Culture  Setup Time   Final    GRAM NEGATIVE RODS IN BOTH AEROBIC AND ANAEROBIC BOTTLES CRITICAL RESULT CALLED TO, READ BACK BY AND VERIFIED WITH: PHARMD MARY SWAYNE ON 11/05/23 @ 1415 BY DRT Performed at [facility], [address], [place], [place] [address]    Culture KLEBSIELLA PNEUMONIAE (A)  Final   Report Status 11/07/2023 FINAL  Final   Organism ID, Bacteria KLEBSIELLA PNEUMONIAE  Final   Organism ID, Bacteria KLEBSIELLA PNEUMONIAE  Final      Susceptibility   Klebsiella pneumoniae - KIRBY BAUER*    CEFAZOLIN SENSITIVE Sensitive    Klebsiella pneumoniae - MIC*    AMPICILLIN RESISTANT Resistant     CEFEPIME <=0.12 SENSITIVE Sensitive     CEFTAZIDIME <=1 SENSITIVE Sensitive     CEFTRIAXONE <=0.25 SENSITIVE Sensitive     CIPROFLOXACIN <=0.25 SENSITIVE Sensitive     GENTAMICIN <=1 SENSITIVE Sensitive     IMIPENEM <=0.25 SENSITIVE Sensitive     TRIMETH/SULFA <=20 SENSITIVE Sensitive     AMPICILLIN/SULBACTAM <=2 SENSITIVE Sensitive     PIP/TAZO <=4 SENSITIVE Sensitive ug/mL    * KLEBSIELLA PNEUMONIAE    KLEBSIELLA PNEUMONIAE  Blood Culture ID Panel (Reflexed)     Status: Abnormal   Collection Time: 11/05/23  1:15 AM  Result Value Ref Range Status   Enterococcus faecalis NOT DETECTED NOT DETECTED Final   Enterococcus Faecium NOT DETECTED NOT DETECTED Final   Listeria monocytogenes NOT DETECTED NOT DETECTED Final   Staphylococcus species NOT DETECTED NOT DETECTED Final   Staphylococcus aureus (BCID) NOT DETECTED NOT DETECTED Final   Staphylococcus epidermidis NOT DETECTED NOT DETECTED Final   Staphylococcus lugdunensis NOT DETECTED NOT DETECTED Final   Streptococcus species NOT DETECTED NOT DETECTED Final   Streptococcus agalactiae NOT DETECTED NOT DETECTED Final   Streptococcus pneumoniae NOT DETECTED NOT DETECTED Final   Streptococcus pyogenes NOT DETECTED NOT DETECTED Final   A.calcoaceticus-baumannii NOT DETECTED NOT DETECTED Final    Bacteroides fragilis NOT DETECTED NOT DETECTED Final   Enterobacterales DETECTED (A) NOT DETECTED Final    Comment: Enterobacterales represent a large order of gram negative bacteria, not a single organism. CRITICAL RESULT CALLED TO, READ BACK BY AND VERIFIED WITH: PHARMD MARY SWAYNE ON 11/05/23 @ 1415 BY DRT    Enterobacter cloacae complex  NOT DETECTED NOT DETECTED Final   Escherichia coli NOT DETECTED NOT DETECTED Final   Klebsiella aerogenes NOT DETECTED NOT DETECTED Final   Klebsiella oxytoca NOT DETECTED NOT DETECTED Final   Klebsiella pneumoniae DETECTED (A) NOT DETECTED Final    Comment: CRITICAL RESULT CALLED TO, READ BACK BY AND VERIFIED WITH: PHARMD MARY SWAYNE ON 11/05/23 @ 1415 BY DRT    Proteus species NOT DETECTED NOT DETECTED Final   Salmonella species NOT DETECTED NOT DETECTED Final   Serratia marcescens NOT DETECTED NOT DETECTED Final   Haemophilus influenzae NOT DETECTED NOT DETECTED Final   Neisseria meningitidis NOT DETECTED NOT DETECTED Final   Pseudomonas aeruginosa NOT DETECTED NOT DETECTED Final   Stenotrophomonas maltophilia NOT DETECTED NOT DETECTED Final   Candida albicans NOT DETECTED NOT DETECTED Final   Candida auris NOT DETECTED NOT DETECTED Final   Candida glabrata NOT DETECTED NOT DETECTED Final   Candida krusei NOT DETECTED NOT DETECTED Final   Candida parapsilosis NOT DETECTED NOT DETECTED Final   Candida tropicalis NOT DETECTED NOT DETECTED Final   Cryptococcus neoformans/gattii NOT DETECTED NOT DETECTED Final   CTX-M ESBL NOT DETECTED NOT DETECTED Final   Carbapenem resistance IMP NOT DETECTED NOT DETECTED Final   Carbapenem resistance KPC NOT DETECTED NOT DETECTED Final   Carbapenem resistance NDM NOT DETECTED NOT DETECTED Final   Carbapenem resist OXA 48 LIKE NOT DETECTED NOT DETECTED Final   Carbapenem resistance VIM NOT DETECTED NOT DETECTED Final    Comment: Performed at [facility], [address], [place], [place] [address]  Culture,  blood (Routine X 2) w Reflex to ID Panel     Status: Abnormal   Collection Time: 11/05/23  1:16 AM   Specimen: BLOOD RIGHT HAND  Result Value Ref Range Status   Specimen Description   Final    BLOOD RIGHT HAND Performed at [facility], [address] [place], [place] [address]    Special Requests   Final    BOTTLES DRAWN AEROBIC AND ANAEROBIC Blood Culture results may not be optimal due to an inadequate volume of blood received in culture bottles Performed at [facility], [address], [place], [place] [address]    Culture  Setup Time   Final    GRAM NEGATIVE RODS AEROBIC BOTTLE ONLY CRITICAL VALUE NOTED.  VALUE IS CONSISTENT WITH PREVIOUSLY REPORTED AND CALLED VALUE.    Culture (A)  Final    KLEBSIELLA PNEUMONIAE SUSCEPTIBILITIES PERFORMED ON PREVIOUS CULTURE WITHIN THE LAST 5 DAYS. Performed at [facility], [address], [place], [place] [address]    Report Status 11/07/2023 FINAL  Final  MRSA Next Gen by PCR, Nasal     Status: None   Collection Time: 11/05/23  5:31 PM   Specimen: Nasal Mucosa; Nasal Swab  Result Value Ref Range Status   MRSA by PCR Next Gen NOT DETECTED NOT DETECTED Final    Comment: (NOTE) The GeneXpert MRSA Assay (FDA approved for NASAL specimens only), is one component of a comprehensive MRSA colonization surveillance program. It is not intended to diagnose MRSA infection nor to guide or monitor treatment for MRSA infections. Test performance is not FDA approved in patients less than 35 years old. Performed at [facility], [address], [place], [place] [address]   Resp panel by RT-PCR (RSV, Flu A&B, Covid) Anterior Nasal Swab     Status: None   Collection Time: 11/05/23  6:47 PM   Specimen:  Anterior Nasal Swab  Result Value Ref Range Status   SARS Coronavirus 2 by RT PCR NEGATIVE NEGATIVE Final    Comment: (NOTE) SARS-CoV-2 target nucleic acids are NOT DETECTED.  The SARS-CoV-2 RNA is  generally detectable in upper respiratory specimens during the acute phase of infection. The lowest concentration of SARS-CoV-2 viral copies this assay can detect is 138 copies/mL. A negative result does not preclude SARS-Cov-2 infection and should not be used as the sole basis for treatment or other patient management decisions. A negative result may occur with  improper specimen collection/handling, submission of specimen other than nasopharyngeal swab, presence of viral mutation(s) within the areas targeted by this assay, and inadequate number of viral copies(<138 copies/mL). A negative result must be combined with clinical observations, patient history, and epidemiological information. The expected result is Negative.  Fact Sheet for Patients:  BloggerCourse.com  Fact Sheet for Healthcare Providers:  SeriousBroker.it  This test is no t yet approved or cleared by the [place] FDA and  has been authorized for detection and/or diagnosis of SARS-CoV-2 by FDA under an Emergency Use Authorization (EUA). This EUA will remain  in effect (meaning this test can be used) for the duration of the COVID-19 declaration under Section 564(b)(1) of the Act, 21 U.S.C.section 360bbb-3(b)(1), unless the authorization is terminated  or revoked sooner.       Influenza A by PCR NEGATIVE NEGATIVE Final   Influenza B by PCR NEGATIVE NEGATIVE Final    Comment: (NOTE) The Xpert Xpress SARS-CoV-2/FLU/RSV plus assay is intended as an aid in the diagnosis of influenza from Nasopharyngeal swab specimens and should not be used as a sole basis for treatment. Nasal washings and aspirates are unacceptable for Xpert Xpress SARS-CoV-2/FLU/RSV testing.  Fact Sheet for Patients: BloggerCourse.com  Fact Sheet for Healthcare Providers: SeriousBroker.it  This test is not yet approved or cleared by the [place] FDA and has been authorized for detection and/or diagnosis of SARS-CoV-2 by FDA under an Emergency Use Authorization (EUA). This EUA will remain in effect (meaning this test can be used) for the duration of the COVID-19 declaration under Section 564(b)(1) of the Act, 21 U.S.C. section 360bbb-3(b)(1), unless the authorization is terminated or revoked.     Resp Syncytial Virus by PCR NEGATIVE NEGATIVE Final    Comment: (NOTE) Fact Sheet for Patients: BloggerCourse.com  Fact Sheet for Healthcare Providers: SeriousBroker.it  This test is not yet approved or cleared by the [place] FDA and has been authorized for detection and/or diagnosis of SARS-CoV-2 by FDA under an Emergency Use Authorization (EUA). This EUA will remain in effect (meaning this test can be used) for the duration of the COVID-19 declaration under Section 564(b)(1) of the Act, 21 U.S.C. section 360bbb-3(b)(1), unless the authorization is terminated or revoked.  Performed at [facility], [address], [place], [place] [address]          Radiology Studies: No results found.       Scheduled Meds:  Chlorhexidine Gluconate Cloth  6 each Topical Q2200   insulin aspart  0-9 Units Subcutaneous Q4H   lidocaine  1 patch Transdermal Q24H   pantoprazole (PROTONIX) IV  40 mg Intravenous Daily   Continuous Infusions:  cefTRIAXone (ROCEPHIN)  IV Stopped (11/11/23 0604)   dextrose 75 mL/hr at 11/11/23 1159     LOS: 7 days    Time spent: 52 minutes spent on chart review, discussion with nursing staff, consultants, updating family and interview/physical exam; more than 50%  of that time was spent in counseling and/or coordination of care.    Alvira Philips [place], DO [facility] Available via Epic secure chat 7am-7pm After these hours, please refer to coverage provider listed on [email or web address] 11/11/2023, 1:11 PM

## 2023-11-11 NOTE — Progress Notes (Signed)
 Physical Therapy Treatment Patient Details Name: LYAN MOYANO MRN: [id number] DOB: [birth year] Today's Date: 11/11/2023   History of Present Illness 83 year old man who presented to [facility] ED 2/24 for abdominal pain. Dx: septic shock, ARF, acute met encephalopathy. S/P ex lap, SB resection, hernia repair 11/05/23.  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    PT Comments  Pt agreeable to working with therapy. Min A for mobility, Total A for toileting hygiene. Pt following commands well. Ambulation distance limited by leaking urostomy-called out to front desk to notify RN. Assisted pt into recliner. Patient will benefit from continued inpatient follow up therapy, <3 hours/day     If plan is discharge home, recommend the following: A little help with walking and/or transfers;A little help with bathing/dressing/bathroom;Assistance with cooking/housework;Assist for transportation;Help with stairs or ramp for entrance   Can travel by private vehicle        Equipment Recommendations  None recommended by PT    Recommendations for Other Services       Precautions / Restrictions Precautions Precautions: Fall Precaution/Restrictions Comments: R urostomy, JP drain on L Restrictions Weight Bearing Restrictions Per Provider Order: No     Mobility  Bed Mobility Overal bed mobility: Needs Assistance Bed Mobility: Supine to Sit     Supine to sit: HOB elevated, Used rails, Min assist     General bed mobility comments: Increased time. Cues required. Assist to scoot fully to EOB.    Transfers Overall transfer level: Needs assistance Equipment used: Rolling walker (2 wheels) Transfers: Sit to/from Stand, Bed to chair/wheelchair/BSC Sit to Stand: Min assist, From elevated  surface   Step pivot transfers: Min assist       General transfer comment: Cues for safety, technique, hand placement. Increased time. Assist to power up, steady, control descent.    Ambulation/Gait Ambulation/Gait assistance: Min assist Gait Distance (Feet): 20 Feet Assistive device: Rolling walker (2 wheels) Gait Pattern/deviations: Step-through pattern, Decreased stride length       General Gait Details: Cues for safety, RW proximity, increased step lengths. Assist to stabilize. Distance limited by leaking urostomy tube.   Stairs             Wheelchair Mobility     Tilt Bed    Modified Rankin (Stroke Patients Only)       Balance Overall balance assessment: Needs assistance         Standing balance support: Bilateral upper extremity supported, During functional activity Standing balance-Leahy Scale: Poor                              Communication    Cognition Arousal: Alert Behavior During Therapy: WFL for tasks assessed/performed   PT - Cognitive impairments: History of cognitive impairments                       PT - Cognition Comments: requires cues. followed 1 step commands well. Following commands: Intact      Cueing Cueing Techniques: Verbal cues  Exercises      General Comments        Pertinent Vitals/Pain Pain Assessment Pain Assessment: Faces Faces Pain Scale: No hurt    Home Living  Prior Function            PT Goals (current goals can now be found in the care plan section) Progress towards PT goals: Progressing toward goals    Frequency    Min 1X/week      PT Plan      Co-evaluation              AM-PAC PT "6 Clicks" Mobility   Outcome Measure  Help needed turning from your back to your side while in a flat bed without using bedrails?: A Little Help needed moving from lying on your back to sitting on the side of a flat bed without using bedrails?:  A Little Help needed moving to and from a bed to a chair (including a wheelchair)?: A Little Help needed standing up from a chair using your arms (e.g., wheelchair or bedside chair)?: A Little Help needed to walk in hospital room?: A Little Help needed climbing 3-5 steps with a railing? : A Lot 6 Click Score: 17    End of Session Equipment Utilized During Treatment: Gait belt Activity Tolerance: Patient tolerated treatment well Patient left: in chair;with call bell/phone within reach;with chair alarm set   PT Visit Diagnosis: Muscle weakness (generalized) (M62.81);Difficulty in walking, not elsewhere classified (R26.2)     Time: 0981-1914 PT Time Calculation (min) (ACUTE ONLY): 27 min  Charges:    $Gait Training: 8-22 mins $Therapeutic Activity: 8-22 mins PT General Charges $$ ACUTE PT VISIT: 1 Visit                        Faye Ramsay, PT Acute Rehabilitation  Office: [phone]

## 2023-11-11 NOTE — Progress Notes (Signed)
 Hypoglycemic Event  CBG:63  Treatment: 8 oz of orange juice  Symptoms: None  Follow-up CBG: 12:07 CBG Result:82  Possible Reasons for Event: Inadequate meal intake  Comments/MD notified:Dr. [place] notified(see orders)    Laurence Compton

## 2023-11-11 NOTE — Progress Notes (Signed)
 OT Cancellation Note  Patient Details Name: Benjamin Hensley MRN: [id number] DOB: August 04, 1941   Cancelled Treatment:    Reason Eval/Treat Not Completed: Medical issues which prohibited therapy Per nurse patient is in bed for wound vac placement at this time. OT to continue to follow and check back as schedule will allow.  Rosalio Loud, MS Acute Rehabilitation Department Office# [phone]  11/11/2023, 1:51 PM

## 2023-11-11 NOTE — Consult Note (Addendum)
 WOC Nurse Consult Note: Surgical team following for assessment and plan of care.  Requested to apply Vac.  Midline abd with full thickness post-op wound, beefy red, 14X3X4cm, mod amt tan drainage.  Pt was medicated for pain prior to the procedure and tolerated with minimal amt discomfort.  Applied barrier ring to lower edge to attempt to maintain a seal and one piece black foam to cont suction. WOC team will plan to change again on Thursday.  Extra supplies in the room.   WOC Nurse ostomy consult note Pt has a urostomy from 2023; he states he changes it prior to admission.  Current pouch is intact with good seal, mod amt yellow urine in bedside drainage bag.  Stoma is red and viable when visualized through the pouch.  Extra supplies left at the bedside for staff nurse's use.  Pouch is in close proximity to the Vac but can remain separate during dressing changes.  Use Supplies: barrier [place] [employer] # [phone] and urostomy pouch [place] # 3 Thank-you,  [address] MSN, RN, [place], [place], [place] [phone]

## 2023-11-11 NOTE — Plan of Care (Signed)
  Problem: Education: Goal: Ability to describe self-care measures that may prevent or decrease complications (Diabetes Survival Skills Education) will improve Outcome: Progressing Goal: Individualized Educational Video(s) Outcome: Progressing   Problem: Coping: Goal: Ability to adjust to condition or change in health will improve Outcome: Progressing   Problem: Fluid Volume: Goal: Ability to maintain a balanced intake and output will improve Outcome: Progressing   Problem: Health Behavior/Discharge Planning: Goal: Ability to identify and utilize available resources and services will improve Outcome: Progressing Goal: Ability to manage health-related needs will improve Outcome: Progressing   Problem: Metabolic: Goal: Ability to maintain appropriate glucose levels will improve Outcome: Progressing   Problem: Nutritional: Goal: Maintenance of adequate nutrition will improve Outcome: Progressing Goal: Progress toward achieving an optimal weight will improve Outcome: Progressing   Problem: Skin Integrity: Goal: Risk for impaired skin integrity will decrease Outcome: Progressing   Problem: Tissue Perfusion: Goal: Adequacy of tissue perfusion will improve Outcome: Progressing   Problem: Education: Goal: Knowledge of General Education information will improve Description: Including pain rating scale, medication(s)/side effects and non-pharmacologic comfort measures Outcome: Progressing   Problem: Health Behavior/Discharge Planning: Goal: Ability to manage health-related needs will improve Outcome: Progressing   Problem: Clinical Measurements: Goal: Ability to maintain clinical measurements within normal limits will improve Outcome: Progressing Goal: Will remain free from infection Outcome: Progressing Goal: Diagnostic test results will improve Outcome: Progressing Goal: Respiratory complications will improve Outcome: Progressing Goal: Cardiovascular complication will  be avoided Outcome: Progressing   Problem: Activity: Goal: Risk for activity intolerance will decrease Outcome: Progressing   Problem: Nutrition: Goal: Adequate nutrition will be maintained Outcome: Progressing   Problem: Coping: Goal: Level of anxiety will decrease Outcome: Progressing   Problem: Elimination: Goal: Will not experience complications related to bowel motility Outcome: Progressing Goal: Will not experience complications related to urinary retention Outcome: Progressing   Problem: Pain Managment: Goal: General experience of comfort will improve and/or be controlled Outcome: Progressing   Problem: Safety: Goal: Ability to remain free from injury will improve Outcome: Progressing   Problem: Skin Integrity: Goal: Risk for impaired skin integrity will decrease Outcome: Progressing   Problem: Activity: Goal: Ability to tolerate increased activity will improve Outcome: Progressing   Problem: Respiratory: Goal: Ability to maintain a clear airway and adequate ventilation will improve Outcome: Progressing   Problem: Role Relationship: Goal: Method of communication will improve Outcome: Progressing

## 2023-11-11 NOTE — Progress Notes (Signed)
 Progress Note  6 Days Post-Op  Subjective: Pt reports poor appetite but is tolerating liquids well. RN ordered some lunch for him but he has not tried to eat much yet. Having some abdominal pain but resistant to taking narcotics. Got to chair earlier but has not walked in the hall yet. Had some leaking from urostomy. Son at bedside this AM.   Objective: Vital signs in last 24 hours: Temp:  [97.7 F (36.5 C)-98.3 F (36.8 C)] 98 F (36.7 C) (03/03 0406) Pulse Rate:  [74-86] 74 (03/03 0406) Resp:  [19-21] 21 (03/03 0406) BP: (127-138)/(67-73) 138/73 (03/03 0406) SpO2:  [96 %] 96 % (03/03 0406) Weight:  [86.5 kg] 86.5 kg (03/03 0500) Last BM Date : 11/11/23  Intake/Output from previous day: 03/02 0701 - 03/03 0700 In: 1284.4 [I.V.:607.5; NG/GT:416.9; IV Piggyback:259.9] Out: 1920 [Urine:1850; Drains:70] Intake/Output this shift: No intake/output data recorded.  PE: General: pleasant, WD, WN male who is laying in bed in NAD Lungs: Respiratory effort nonlabored Abd: soft, appropriately ttp, ND, urostomy in RLQ and productive, midline wound clean, LLQ drain with SS fluid    Lab Results:  Recent Labs    11/10/23 0751 11/11/23 0400  WBC 8.4 9.7  HGB 10.1* 10.0*  HCT 34.0* 32.4*  PLT 93* 114*   BMET Recent Labs    11/10/23 0751 11/11/23 0400  NA 153* 146*  K 4.5 5.3*  CL 121* 117*  CO2 23 22  GLUCOSE 136* 107*  BUN 51* 45*  CREATININE 2.31* 2.16*  CALCIUM 8.4* 8.0*   PT/INR Recent Labs    11/09/23 0446  LABPROT 14.7  INR 1.1   CMP     Component Value Date/Time   NA 146 (H) 11/11/2023 0400   K 5.3 (H) 11/11/2023 0400   CL 117 (H) 11/11/2023 0400   CO2 22 11/11/2023 0400   GLUCOSE 107 (H) 11/11/2023 0400   BUN 45 (H) 11/11/2023 0400   CREATININE 2.16 (H) 11/11/2023 0400   CALCIUM 8.0 (L) 11/11/2023 0400   PROT 5.3 (L) 11/11/2023 0400   ALBUMIN 2.4 (L) 11/11/2023 0400   AST 88 (H) 11/11/2023 0400   ALT 107 (H) 11/11/2023 0400   ALKPHOS 315 (H)  11/11/2023 0400   BILITOT 1.0 11/11/2023 0400   GFRNONAA 30 (L) 11/11/2023 0400   Lipase     Component Value Date/Time   LIPASE 38 11/14/2021 1355       Studies/Results: No results found.  Anti-infectives: Anti-infectives (From admission, onward)    Start     Dose/Rate Route Frequency Ordered Stop   11/07/23 0500  cefTRIAXone (ROCEPHIN) 2 g in sodium chloride 0.9 % 100 mL IVPB        2 g 200 mL/hr over 30 Minutes Intravenous Every 24 hours 11/06/23 1046 11/14/23 1309   11/06/23 0400  ceFEPIme (MAXIPIME) 1 g in sodium chloride 0.9 % 100 mL IVPB  Status:  Discontinued        1 g 200 mL/hr over 30 Minutes Intravenous Every 24 hours 11/05/23 0456 11/05/23 1419   11/06/23 0400  ceFEPIme (MAXIPIME) 2 g in sodium chloride 0.9 % 100 mL IVPB  Status:  Discontinued        2 g 200 mL/hr over 30 Minutes Intravenous Every 24 hours 11/05/23 1419 11/06/23 1046   11/05/23 1200  metroNIDAZOLE (FLAGYL) IVPB 500 mg  Status:  Discontinued        500 mg 100 mL/hr over 60 Minutes Intravenous Every 12 hours 11/05/23 1106 11/06/23  1046   11/05/23 0345  ceFEPIme (MAXIPIME) 2 g in sodium chloride 0.9 % 100 mL IVPB        2 g 200 mL/hr over 30 Minutes Intravenous  Once 11/05/23 0331 11/05/23 0424        Assessment/Plan  83 y/o M w/ history of bladder cancer, cystoprostatectomy and ileal conduit, ex lap for SBO in 2023 who presented with acute abdominal pain and vomiting. CT significant for SBO in RLQ. He was in septic shock in the ED and was taken emergently to the operating room where a closed loop SBO was found.  S/P EXPLORATORY LAPAROTOMY; lysis of adhesions; small bowel resection; ligation of left ureter; primary hernia repair 2/25 Dr. Carolynne Edouard (with Dr. Alvester Morin, Dr. Berneice Heinrich)  -  POD#6, afebrile, WBC 9.7, hgb 10.0 - tolerating liquid intake, on D3 soft diet, will add ensure - WOC consulted for [facility] placement today  - continue JP drain, it is sitting in the pelvis where the left ureter was ligated.  Drain Cr 2.4 and serum Cr 2.16 - mobilize as able      FEN: D3 diet, ensure, IVF per [facility] ID: rocephin  Foley: none - urostomy productive VTE: currently none, ok for LMWH or SQH from surgery standpoint   LOS: 7 days     Juliet Rude, [facility] Surgery 11/11/2023, 12:26 PM Please see Amion for pager number during day hours 7:00am-4:30pm

## 2023-11-11 NOTE — NC FL2 (Signed)
  MEDICAID FL2 LEVEL OF CARE FORM     IDENTIFICATION  Patient Name: Benjamin Hensley Birthdate: 07-May-1941 Sex: male Admission Date (Current Location): 11/04/2023  [facility] and [place] Number:  Producer, television/film/video and Address:  [facility],  [address] [place], [place] [address]      Provider Number: [id number]  Attending Physician Name and Address:  [place], Eric J, DO  Relative Name and Phone Number:  Kruze, Atchley (Spouse)  [phone] (Mobile)    Current Level of Care: Hospital Recommended Level of Care: Skilled Nursing Facility Prior Approval Number:    Date Approved/Denied:   PASRR Number: [id number]  Discharge Plan: SNF    Current Diagnoses: Patient Active Problem List   Diagnosis Date Noted   Protein-calorie malnutrition, moderate (HCC) 11/10/2023   Sepsis (HCC) 11/05/2023   SBO (small bowel obstruction) (HCC) 11/05/2023   Shock (HCC) 11/05/2023   Small bowel obstruction (HCC) 11/05/2023   Generalized abdominal pain 11/04/2023   Hydronephrosis 04/02/2022   Kidney stone 03/30/2022   Anemia of chronic disease 11/27/2021   Leukocytosis 11/27/2021   SIRS (systemic inflammatory response syndrome) (HCC) 11/27/2021   Chronic kidney disease, stage 3a (HCC) 11/26/2021   Benign prostatic hyperplasia 11/26/2021   Generalized anxiety disorder 11/26/2021   Intermittent asthma 11/26/2021   UTI (urinary tract infection) 11/26/2021   HLD (hyperlipidemia) 11/26/2021   Fall at home, initial encounter 11/26/2021   Generalized weakness 11/26/2021   AKI (acute kidney injury) (HCC) 11/26/2021   Complicated UTI (urinary tract infection) 11/14/2021   Bladder cancer (HCC) 09/22/2021   Malignant neoplasm of bladder neck (HCC) 05/03/2021   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) 05/02/2021    Orientation RESPIRATION BLADDER Height & Weight     Self, Place  Normal Incontinent Weight: 190 lb 11.2 oz (86.5 kg) Height:  5\' 11"  (180.3 cm)   BEHAVIORAL SYMPTOMS/MOOD NEUROLOGICAL BOWEL NUTRITION STATUS      Incontinent Diet (DSY 3)  AMBULATORY STATUS COMMUNICATION OF NEEDS Skin   Limited Assist Verbally Other (Comment) (Closed incision Abdomen)                       Personal Care Assistance Level of Assistance  Bathing, Feeding, Dressing Bathing Assistance: Limited assistance Feeding assistance: Independent Dressing Assistance: Limited assistance     Functional Limitations Info  Sight, Hearing, Speech Sight Info: Impaired (glasses) Hearing Info: Adequate Speech Info: Adequate    SPECIAL CARE FACTORS FREQUENCY  OT (By licensed OT), PT (By licensed PT)     PT Frequency: 5 x a week OT Frequency: 5 x a week            Contractures Contractures Info: Not present    Additional Factors Info  Code Status, Allergies Code Status Info: full Allergies Info: NkA           Current Medications (11/11/2023):  This is the current hospital active medication list Current Facility-Administered Medications  Medication Dose Route Frequency Provider Last Rate Last Admin   bisacodyl (DULCOLAX) suppository 10 mg  10 mg Rectal Daily PRN [place], Alvira Philips, DO   10 mg at 11/09/23 1729   cefTRIAXone (ROCEPHIN) 2 g in sodium chloride 0.9 % 100 mL IVPB  2 g Intravenous Q24H [place], Eric J, DO   Stopped at 11/11/23 0604   Chlorhexidine Gluconate Cloth 2 % PADS 6 each  6 each Topical Q2200 Oretha Milch, MD   6 each at 11/10/23 2200   dextrose 5 %  solution   Intravenous Continuous [place], Eric J, [place] 75 mL/hr at 11/11/23 1159 Rate Change at 11/11/23 1159   feeding supplement (OSMOLITE 1.5 CAL) liquid 1,000 mL  1,000 mL Per Tube Continuous Karie Soda, MD   Stopped at 11/10/23 1502   fentaNYL (SUBLIMAZE) injection 25-50 mcg  25-50 mcg Intravenous Q2H PRN Tim Lair, PA-C   50 mcg at 11/10/23 0042   insulin aspart (novoLOG) injection 0-9 Units  0-9 Units Subcutaneous Q4H Adam Phenix, PA-C   2 Units at 11/10/23 1632    lidocaine (LIDODERM) 5 % 1 patch  1 patch Transdermal Q24H Adam Phenix, PA-C   1 patch at 11/10/23 9528   naphazoline-glycerin (CLEAR EYES REDNESS) ophth solution 1-2 drop  1-2 drop Both Eyes QID PRN [place], Eric J, DO   2 drop at 11/10/23 1322   Oral care mouth rinse  15 mL Mouth Rinse PRN Oretha Milch, MD       oxyCODONE (Oxy IR/ROXICODONE) immediate release tablet 5-10 mg  5-10 mg Per Tube Q4H PRN [place], Eric J, DO       pantoprazole (PROTONIX) injection 40 mg  40 mg Intravenous Daily [place], Alvira Philips, DO   40 mg at 11/11/23 4132   prochlorperazine (COMPAZINE) injection 5 mg  5 mg Intravenous Q6H PRN Adam Phenix, PA-C         Discharge Medications: Please see discharge summary for a list of discharge medications.  Relevant Imaging Results:  Relevant Lab Results:   Additional Information SSN [id number]  Valentina Shaggy Lavene Penagos, LCSW

## 2023-11-11 NOTE — TOC Progression Note (Signed)
 Transition of Care [facility]) - Progression Note    Patient Details  Name: Benjamin Hensley MRN: [id number] Date of Birth: 11/24/1940  [facility] CM/SW Contact  Larrie Kass, LCSW Phone Number: 11/11/2023, 11:44 AM  Clinical Narrative:    CSW spoke with pt's wife to discuss recommendations for rehab placement. [name] wife reports pt is from ILF at [facility].  She is a agreeable and would Like [facility]. CSW explained the process noting pt will need insurance authorization. CSW to work pt up for SNF placement.   CSW attempted to speak with [facility] admission with [facility], left VM requesting a return call. TOC to follow.    Expected Discharge Plan: Skilled Nursing Facility Barriers to Discharge: Continued Medical Work up, English as a second language teacher  Expected Discharge Plan and Services       Living arrangements for the past 2 months: Independent Living Facility                                       Social Determinants of Health (SDOH) Interventions SDOH Screenings   Food Insecurity: Patient Unable To Answer (11/06/2023)  Transportation Needs: Patient Unable To Answer (11/06/2023)  Utilities: Patient Unable To Answer (11/06/2023)  Social Connections: Patient Unable To Answer (11/06/2023)  Tobacco Use: Medium Risk (11/04/2023)    Readmission Risk Interventions    11/06/2023    9:31 AM  Readmission Risk Prevention Plan  Transportation Screening Complete  PCP or Specialist Appt within 3-5 Days Complete  HRI or Home Care Consult Complete  Social Work Consult for Recovery Care Planning/Counseling Complete  Palliative Care Screening Not Applicable  Medication Review Oceanographer) Complete

## 2023-11-12 DIAGNOSIS — R1084 Generalized abdominal pain: Secondary | ICD-10-CM | POA: Diagnosis not present

## 2023-11-12 LAB — COMPREHENSIVE METABOLIC PANEL
ALT: 90 U/L — ABNORMAL HIGH (ref 0–44)
AST: 60 U/L — ABNORMAL HIGH (ref 15–41)
Albumin: 2.1 g/dL — ABNORMAL LOW (ref 3.5–5.0)
Alkaline Phosphatase: 268 U/L — ABNORMAL HIGH (ref 38–126)
Anion gap: 9 (ref 5–15)
BUN: 38 mg/dL — ABNORMAL HIGH (ref 8–23)
CO2: 21 mmol/L — ABNORMAL LOW (ref 22–32)
Calcium: 7.5 mg/dL — ABNORMAL LOW (ref 8.9–10.3)
Chloride: 107 mmol/L (ref 98–111)
Creatinine, Ser: 2.08 mg/dL — ABNORMAL HIGH (ref 0.61–1.24)
GFR, Estimated: 32 mL/min — ABNORMAL LOW (ref >60)
Glucose, Bld: 109 mg/dL — ABNORMAL HIGH (ref 70–99)
Potassium: 3.9 mmol/L (ref 3.5–5.1)
Sodium: 137 mmol/L (ref 135–145)
Total Bilirubin: 0.6 mg/dL (ref 0.0–1.2)
Total Protein: 5 g/dL — ABNORMAL LOW (ref 6.5–8.1)

## 2023-11-12 LAB — CBC
HCT: 29.2 % — ABNORMAL LOW (ref 39.0–52.0)
Hemoglobin: 9.2 g/dL — ABNORMAL LOW (ref 13.0–17.0)
MCH: 28 pg (ref 26.0–34.0)
MCHC: 31.5 g/dL (ref 30.0–36.0)
MCV: 88.8 fL (ref 80.0–100.0)
Platelets: 134 10*3/uL — ABNORMAL LOW (ref 150–400)
RBC: 3.29 MIL/uL — ABNORMAL LOW (ref 4.22–5.81)
RDW: 14 % (ref 11.5–15.5)
WBC: 11.1 10*3/uL — ABNORMAL HIGH (ref 4.0–10.5)
nRBC: 0.3 % — ABNORMAL HIGH (ref 0.0–0.2)

## 2023-11-12 LAB — GLUCOSE, CAPILLARY
Glucose-Capillary: 103 mg/dL — ABNORMAL HIGH (ref 70–99)
Glucose-Capillary: 77 mg/dL (ref 70–99)

## 2023-11-12 MED ORDER — ENOXAPARIN SODIUM 30 MG/0.3ML IJ SOSY
30.0000 mg | PREFILLED_SYRINGE | INTRAMUSCULAR | Status: DC
  Administered 2023-11-12 – 2023-11-14 (×3): 30 mg via SUBCUTANEOUS
  Filled 2023-11-12 (×3): qty 0.3

## 2023-11-12 MED ORDER — ENSURE ENLIVE PO LIQD
237.0000 mL | Freq: Two times a day (BID) | ORAL | Status: DC
Start: 2023-11-12 — End: 2023-11-14
  Administered 2023-11-12 – 2023-11-13 (×2): 237 mL via ORAL

## 2023-11-12 MED ORDER — PANTOPRAZOLE SODIUM 40 MG PO TBEC
40.0000 mg | DELAYED_RELEASE_TABLET | Freq: Every day | ORAL | Status: DC
  Administered 2023-11-13 – 2023-11-14 (×2): 40 mg via ORAL
  Filled 2023-11-12 (×2): qty 1

## 2023-11-12 NOTE — Progress Notes (Signed)
 Progress Note  7 Days Post-Op  Subjective: Pt reports he ate well yesterday denies nausea or vomiting. He is having some loose stools. Pain well controlled.   Objective: Vital signs in last 24 hours: Temp:  [98 F (36.7 C)-98.8 F (37.1 C)] 98.8 F (37.1 C) (03/04 0433) Pulse Rate:  [71-79] 77 (03/04 0433) Resp:  [15-20] 15 (03/04 0433) BP: (121-132)/(56-61) 132/56 (03/04 0433) SpO2:  [98 %-99 %] 99 % (03/04 0433) Weight:  [86.6 kg] 86.6 kg (03/04 0437) Last BM Date : 11/11/23  Intake/Output from previous day: 03/03 0701 - 03/04 0700 In: 496.7 [P.O.:180; I.V.:316.7] Out: 2100 [Urine:2050; Drains:50] Intake/Output this shift: Total I/O In: 240 [P.O.:240] Out: -   PE: General: pleasant, WD, WN male who is laying in bed in NAD Lungs: Respiratory effort nonlabored Abd: soft, appropriately ttp, ND, urostomy in RLQ and productive, midline wound with VAC present with good seal. JP serous - removed without complication   Lab Results:  Recent Labs    11/11/23 0400 11/12/23 0430  WBC 9.7 11.1*  HGB 10.0* 9.2*  HCT 32.4* 29.2*  PLT 114* 134*   BMET Recent Labs    11/11/23 0400 11/12/23 0430  NA 146* 137  K 5.3* 3.9  CL 117* 107  CO2 22 21*  GLUCOSE 107* 109*  BUN 45* 38*  CREATININE 2.16* 2.08*  CALCIUM 8.0* 7.5*   PT/INR No results for input(s): "LABPROT", "INR" in the last 72 hours.  CMP     Component Value Date/Time   NA 137 11/12/2023 0430   K 3.9 11/12/2023 0430   CL 107 11/12/2023 0430   CO2 21 (L) 11/12/2023 0430   GLUCOSE 109 (H) 11/12/2023 0430   BUN 38 (H) 11/12/2023 0430   CREATININE 2.08 (H) 11/12/2023 0430   CALCIUM 7.5 (L) 11/12/2023 0430   PROT 5.0 (L) 11/12/2023 0430   ALBUMIN 2.1 (L) 11/12/2023 0430   AST 60 (H) 11/12/2023 0430   ALT 90 (H) 11/12/2023 0430   ALKPHOS 268 (H) 11/12/2023 0430   BILITOT 0.6 11/12/2023 0430   GFRNONAA 32 (L) 11/12/2023 0430   Lipase     Component Value Date/Time   LIPASE 38 11/14/2021 1355        Studies/Results: No results found.  Anti-infectives: Anti-infectives (From admission, onward)    Start     Dose/Rate Route Frequency Ordered Stop   11/07/23 0500  cefTRIAXone (ROCEPHIN) 2 g in sodium chloride 0.9 % 100 mL IVPB        2 g 200 mL/hr over 30 Minutes Intravenous Every 24 hours 11/06/23 1046 11/14/23 1309   11/06/23 0400  ceFEPIme (MAXIPIME) 1 g in sodium chloride 0.9 % 100 mL IVPB  Status:  Discontinued        1 g 200 mL/hr over 30 Minutes Intravenous Every 24 hours 11/05/23 0456 11/05/23 1419   11/06/23 0400  ceFEPIme (MAXIPIME) 2 g in sodium chloride 0.9 % 100 mL IVPB  Status:  Discontinued        2 g 200 mL/hr over 30 Minutes Intravenous Every 24 hours 11/05/23 1419 11/06/23 1046   11/05/23 1200  metroNIDAZOLE (FLAGYL) IVPB 500 mg  Status:  Discontinued        500 mg 100 mL/hr over 60 Minutes Intravenous Every 12 hours 11/05/23 1106 11/06/23 1046   11/05/23 0345  ceFEPIme (MAXIPIME) 2 g in sodium chloride 0.9 % 100 mL IVPB        2 g 200 mL/hr over 30 Minutes Intravenous  Once 11/05/23 0331 11/05/23 0424        Assessment/Plan  83 y/o M w/ history of bladder cancer, cystoprostatectomy and ileal conduit, ex lap for SBO in 2023 who presented with acute abdominal pain and vomiting. CT significant for SBO in RLQ. He was in septic shock in the ED and was taken emergently to the operating room where a closed loop SBO was found.  S/P EXPLORATORY LAPAROTOMY; lysis of adhesions; small bowel resection; ligation of left ureter; primary hernia repair 2/25 Dr. Carolynne Edouard (with Dr. Alvester Morin, Dr. Berneice Heinrich)  -  POD#7, afebrile, WBC 11.1, hgb 9.2 - advance to [facility] diet, will add ensure - pt not diabetic I think we can stop SSI and CBG checks since he is off tube feeds now  - VAC M-R, will discuss with TOC needs to continue this at SNF - JP drain removed at bedside today  - mobilize as able  - approaching readiness for discharge from a surgical standpoint, defer medical readiness to  primary attending      FEN: HH diet, IVF per [facility] ID: rocephin for bacteremia  Foley: none - urostomy productive VTE: currently none, ok for LMWH or SQH from surgery standpoint   LOS: 8 days     Juliet Rude, [facility] 11/12/2023, 10:01 AM Please see Amion for pager number during day hours 7:00am-4:30pm

## 2023-11-12 NOTE — Evaluation (Signed)
 Occupational Therapy Evaluation Patient Details Name: Benjamin Hensley MRN: [id number] DOB: 1941/02/25 Today's Date: 11/12/2023   History of Present Illness   83 year old man who presented to [facility] ED 2/24 for abdominal pain. Dx: septic shock, ARF, acute met encephalopathy. S/P ex lap, SB resection, hernia repair 11/05/23.  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     Clinical Impressions Patient is a 83 year old male who was admitted for above. Patient was living at friends home with wife prior level. Currently, patient is mod A for transfers with RW, TD for LB dressing/bathing tasks with recent abdominal surgery. Patient was noted to have decreased functional activity tolerance, decreased endurance, decreased standing balance, decreased safety awareness, and decreased knowledge of AD/AE impacting participation in ADLs. Patient will benefit from continued inpatient follow up therapy, <3 hours/day.      If plan is discharge home, recommend the following:   A lot of help with bathing/dressing/bathroom;Assistance with cooking/housework;Direct supervision/assist for medications management;Assist for transportation;Help with stairs or ramp for entrance;Direct supervision/assist for financial management     Functional Status Assessment   Patient has had a recent decline in their functional status and demonstrates the ability to make significant improvements in function in a reasonable and predictable amount of time.     Equipment Recommendations   None recommended by OT      Precautions/Restrictions   Precautions Precautions: Fall Precaution/Restrictions Comments: R urostomy, JP drain on L, wound vac Restrictions Weight Bearing Restrictions Per Provider Order:  No     Mobility Bed Mobility Overal bed mobility: Needs Assistance Bed Mobility: Supine to Sit     Supine to sit: HOB elevated, Used rails, Min assist     General bed mobility comments: Increased time. Cues required. Assist to scoot fully to EOB.                             ADL either performed or assessed with clinical judgement   ADL Overall ADL's : Needs assistance/impaired Eating/Feeding: Modified independent;Sitting Eating/Feeding Details (indicate cue type and reason): ed on getting up for all meals in chair. Grooming: Sitting;Set up;Supervision/safety   Upper Body Bathing: Minimal assistance   Lower Body Bathing: Total assistance;Sitting/lateral leans   Upper Body Dressing : Sitting;Minimal assistance   Lower Body Dressing: Sitting/lateral leans;Maximal assistance   Toilet Transfer: Moderate assistance;Rolling walker (2 wheels);Stand-pivot   Toileting- Clothing Manipulation and Hygiene: Maximal assistance;Sit to/from stand Toileting - Clothing Manipulation Details (indicate cue type and reason): soiled bed pad when standing.             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            Pertinent Vitals/Pain Pain Assessment Pain Assessment: Faces Faces Pain Scale: Hurts a little bit Pain Location: abdomen Pain Descriptors / Indicators: Sore Pain Intervention(s): Monitored during session     Extremity/Trunk Assessment Upper Extremity Assessment Upper Extremity Assessment: Overall WFL for tasks assessed   Lower Extremity Assessment Lower Extremity Assessment: Defer to PT evaluation   Cervical / Trunk Assessment Cervical / Trunk Assessment: Normal   Communication     Cognition Arousal: Alert Behavior During Therapy: WFL for tasks assessed/performed Cognition: No apparent impairments  Home Living Family/patient expects to be discharged to:: Private residence Living Arrangements: Spouse/significant other Available Help at Discharge: Family;Available PRN/intermittently Type of Home: Apartment                                  Prior Functioning/Environment Prior Level of Function : Independent/Modified Independent                    OT Problem List: Decreased strength;Decreased activity tolerance;Impaired balance (sitting and/or standing);Decreased safety awareness;Decreased knowledge of precautions;Decreased knowledge of use of DME or AE   OT Treatment/Interventions: Self-care/ADL training;Neuromuscular education;DME and/or AE instruction;Therapeutic activities;Patient/family education;Balance training      OT Goals(Current goals can be found in the care plan section)   Acute Rehab OT Goals Patient Stated Goal: to go home OT Goal Formulation: With patient Time For Goal Achievement: 11/26/23 Potential to Achieve Goals: Fair   OT Frequency:  Min 2X/week       AM-PAC OT "6 Clicks" Daily Activity     Outcome Measure Help from another person eating meals?: None Help from another person taking care of personal grooming?: A Little Help from another person toileting, which includes using toliet, bedpan, or urinal?: A Lot Help from another person bathing (including washing, rinsing, drying)?: A Lot Help from another person to put on and taking off regular upper body clothing?: A Little Help from another person to put on and taking off regular lower body clothing?: A Lot 6 Click Score: 16   End of Session Equipment Utilized During Treatment: Rolling walker (2 wheels) Nurse Communication: Mobility status  Activity Tolerance: Patient tolerated treatment well Patient left: in chair;with call bell/phone within reach;with chair alarm set  OT Visit Diagnosis: Unsteadiness on feet (R26.81);Other abnormalities of gait and mobility (R26.89);Muscle  weakness (generalized) (M62.81)                Time: 1610-9604 OT Time Calculation (min): 25 min Charges:  OT General Charges $OT Visit: 1 Visit OT Evaluation $OT Eval Moderate Complexity: 1 Mod OT Treatments $Self Care/Home Management : 8-22 mins  Rosalio Loud, MS Acute Rehabilitation Department Office# [phone]   Selinda Flavin 11/12/2023, 1:40 PM

## 2023-11-12 NOTE — TOC Progression Note (Signed)
 Transition of Care [facility]) - Progression Note    Patient Details  Name: GLEB MCGUIRE MRN: [id number] Date of Birth: Feb 12, 1941  Transition of Care [facility] CM/SW Contact  Larrie Kass, LCSW Phone Number: 11/12/2023, 1:22 PM  Clinical Narrative:     CSW received call from [facility] requesting to speak with pt's RN.   CSW has started English as a second language teacher . Authorization for SNF is pending. TOC to follow.  Expected Discharge Plan: Skilled Nursing Facility Barriers to Discharge: Continued Medical Work up, English as a second language teacher  Expected Discharge Plan and Services       Living arrangements for the past 2 months: Independent Living Facility                                       Social Determinants of Health (SDOH) Interventions SDOH Screenings   Food Insecurity: Patient Unable To Answer (11/06/2023)  Transportation Needs: Patient Unable To Answer (11/06/2023)  Utilities: Patient Unable To Answer (11/06/2023)  Social Connections: Patient Unable To Answer (11/06/2023)  Tobacco Use: Medium Risk (11/04/2023)    Readmission Risk Interventions    11/06/2023    9:31 AM  Readmission Risk Prevention Plan  Transportation Screening Complete  PCP or Specialist Appt within 3-5 Days Complete  HRI or Home Care Consult Complete  Social Work Consult for Recovery Care Planning/Counseling Complete  Palliative Care Screening Not Applicable  Medication Review [employer]) Complete

## 2023-11-12 NOTE — Progress Notes (Signed)
 Nutrition Follow-up  DOCUMENTATION CODES:   Not applicable  INTERVENTION:  - Heart diet per [facility]CS.  - Ensure Plus High Protein po BID, each supplement provides 350 kcal and 20 grams of protein. - Monitor weight trends.  NUTRITION DIAGNOSIS:   Increased nutrient needs related to acute illness as evidenced by estimated needs. *new  GOAL:   Patient will meet greater than or equal to 90% of their needs *progressing  MONITOR:   PO intake, Supplement acceptance, Weight trends  REASON FOR ASSESSMENT:   Consult Enteral/tube feeding initiation and management  ASSESSMENT:   83 y.o. male with PMH significant for HLD, SBO (s/p ex-lap/SBR 02/2022), aggressive bladder/prostate CA requiring ileal conduit, CKD stage IV, dementia who presented for sudden onset of abdominal pain around 1200 with nausea, vomiting and chills. Admitted with septic shock and small bowel obstruction.  2/24 Admit 2/25 s/ ex-lap, found to have closed-loop SBO with necrosis s/p SBR; remained intubated post-op 2/26 Extubated 2/28 TF started at 13mL/hr and advanced to 75mL/hr after 6 hours 3/2 SLP eval-> DYS 1 diet then NGT removed so DYS 3 diet 3/4 Heart diet per [facility]  Patient sleeping at time of visit. RN reports patient ate well this morning (100%) while still on a DYS 3 diet.  CCS advanced patient to a heart diet this AM. SLP to also see patient today for another eval.  Ensure added to support oral intake.     Admit weight: 217# -> 204# (same day) Current weight: 190# I&O's: +822mL  Medications reviewed and include: -  Labs reviewed:  Creatinine 2.08   Diet Order:   Diet Order             Diet Heart Room service appropriate? Yes; Fluid consistency: Thin  Diet effective now                   EDUCATION NEEDS:  No education needs have been identified at this time  Skin:  Skin Assessment: Skin Integrity Issues: Skin Integrity Issues:: Incisions Incisions: Abdomen  Last BM:  3/3 - type  7  Height:  Ht Readings from Last 1 Encounters:  11/05/23 5\' 11"  (1.803 m)   Weight:  Wt Readings from Last 1 Encounters:  11/12/23 86.6 kg    BMI:  Body mass index is 26.63 kg/m.  Estimated Nutritional Needs:  Kcal:  2000-2300 kcals Protein:  100-120 grams Fluid:  >/= 2L    Shelle Iron RD, LDN Contact via Secure Chat.

## 2023-11-12 NOTE — Progress Notes (Signed)
 Speech Language Pathology Treatment: Dysphagia  Patient Details Name: Benjamin Hensley MRN: [id number] DOB: Feb 01, 1941 Today's Date: 11/12/2023 Time: 5621-3086 SLP Time Calculation (min) (ACUTE ONLY): 18 min  Assessment / Plan / Recommendation Clinical Impression  Patient seen to assess po tolerance given his diet advancement and NG removal.  Patient reports voice to be at baseline strength and swallow to be normal.    [name] voice was strong and he was able to consume Water, Ensure and [employer]. Adequate mastication with oral clearance.   Swallow clinically judged to be minimally delayed with liquids  but not significantly.    Suspect pt's coughing with liquids on Sunday was due to his NG tube impacting epiglottic clearance. Recommend continue his regular/thin diet with general precautions.  Noted he is now on a PPI.    All education completed for general precautions and no SLP follow up indicated. Thanks for allowing SlP to assist with pt's care plan.   HPI HPI: Pt is an 83 yo male adm to [facility] with abdominal pain. Pt required emergent bowel surgery.  PMH + for bladder cancer, SBO 2023, HLD, falls, He has a JP drain and NG tube in place.  Surgery ordered swallow eval with hopes to advance diet and remove NG. Hospital coarse complicated by delirium and encephalopathy.  Most recent CXR showed Improved bibasilar airspace opacities remain, right greater than  left. A small right pleural effusion is suspected.  Swallow evaluation ordered and conducted and pt has been completed and pt had NG removed.      SLP Plan  All goals met      Recommendations for follow up therapy are one component of a multi-disciplinary discharge planning process, led by the attending physician.  Recommendations may be updated based on patient status, additional functional criteria and insurance authorization.    Recommendations  Diet recommendations: Regular;Thin liquid Liquids provided via: Straw;Cup Medication  Administration: Whole meds with liquid Supervision: Patient able to self feed Compensations: Slow rate;Small sips/bites Postural Changes and/or Swallow Maneuvers: Seated upright 90 degrees;Upright 30-60 min after meal                  Oral care BID           All goals met   Benjamin Infante, MS [facility] SLP Acute Rehab Services Office [phone]   Chales Abrahams  11/12/2023, 4:34 PM

## 2023-11-12 NOTE — Progress Notes (Signed)
 PROGRESS NOTE    [name]  NWG:[id number] DOB: 02-28-1941 DOA: 11/04/2023 PCP: Thana Ates, MD    Brief Narrative:   Benjamin Hensley is a 83 y.o. male with past medical history significant for SBO (s/p ex-lap/SBR 02/2022), aggressive bladder/prostate CA requiring ileal conduit (followed by Urology - Dr. 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  In the ED, Tmax 103.68F, tachycardic to 100s, tachypneic to 20s-low 30s, BP 150s/80s. Labs were notable for WBC 8.3, Hgb 14.1, Plt 146. Na 131, K 4.3, CO2 21, Cr 2.71 (baseline variable), LFTs WNL. Trop unremarkable. UA with small Hgb, 100 protein, large leuks, rare bacteria. Blood Cx and Urine Cx pending.  CT abdomen/pelvis with right lower quadrant ileal conduit that is decompressed with bilateral hydronephrosis/hydroureter, left renal atrophy, concern for small bowel obstruction with possible vascular compromise causing small bowel wall thickening appears to be localized in the right lower quadrant with concern of adhesions.  Empiric broad-spectrum antibiotics (cefepime/Flagyl) started.  EDP discussed with urology and general surgery will see in consultation.  Hospitalist service consulted for admission.  Significant Hospital events: 2/24: Admit to [facility], [facility], urology consulted; started on broad-spectrum antibiotics 2/25: Persistently febrile with soft BP, rising LA to 7.1. PCCM consulted. Taken to OR for ex-lap; found to have closed-loop SBO with necrosis (resected) 2/2 tight band (nonfunctional L ureter) which was ligated. Taken to ICU postoperatively on 2 vasopressors and intubated. LIJ CVC placed for access. 2/26: Sedation weaned, extubated. 2/27: POD#2 from ex-lap/LOA and L ureteral  ligation. Remains encephalopathic and uncomfortable. Bicarb gtt discontinued. Off of Levophed. Urostomy with increased mucous/purulent material/ Plt 49, held Lovenox 2/28: Encephalopathy improving, right IJ catheter removed, creatinine improved to 2.6.  Transferred to progressive unit 3/1: Transferred to [facility]; tube feeds increased to 30 mL/h; stable; downgrade to med/surg 3/2: per CCS, ok to increase tube feeds to goal, SLP eval for swallow study 3/3: NGT/tube feeds discontinued yesterday, started on diet, remains with poor oral intake, hypoglycemic started on D5W gtt 3/4: JP drain removed and diet further advanced by general surgery, will discontinue D5 drip given increased oral intake   Assessment & Plan:   Small bowel obstruction with necrosis s/p Exlap, LOA, SBR Patient presenting to the ED with acute onset abdominal pain.  Temperature 103.5, tachycardic, tachypneic.  CT abdomen/pelvis notable for SBO with possible vascular compromise.  General surgery was consulted and patient underwent exploratory laparotomy with lysis of adhesions, small bowel resection, primary hernia repair on 11/05/2023 by Dr. Carolynne Edouard.  -- General Surgery following, appreciate assistance -- Oxycodone 5-10 mg PO q4h PRN moderate pain -- Lidocaine patch -- Fentanyl 25-50 mg IV q2h PRN severe pain -- Further per general surgery  Septic shock Klebsiella pneumonia UTI/bacteremia Urine culture and blood cultures positive for Klebsiella pneumonia with resistance to ampicillin. -- Continue ceftriaxone 2 g IV q24h; plan 10-day course  Acute metabolic encephalopathy: Improving Etiology likely secondary to critical illness, sepsis secondary to bowel obstruction with necrosis as above.  Postoperatively required vasopressor support, mechanical ventilation; which now has been discontinued.  -- Supportive care  Acute renal failure on CKD stage IV History of bladder/prostate cancer s/p ileal conduit History of recurrent  pyelonephritis s/p cystectomy Chronic left hydronephrotic kidney Patient follows with urology outpatient, Dr. Berneice Heinrich.  Does not follow  with nephrology outpatient.  Underwent cystoprostatectomy with conduit diversion initially  January 2023.  Patient underwent left ureter ligation and repair of ileal conduit by Dr. Alvester Morin on 11/05/2023.  -- Nephrology following, appreciate assistance -- Cr 2.71>>4.85>>2.63>2.55>2.31>2.16 -- BMP daily  Hypoglycemia Patient transition to oral diet yesterday.  Poor intake.  Blood sugar 60 this morning.  Received orange juice.   -- D5W at 75 mL/h -- Monitor glucose closely  Transaminitis -- AST 21>>81>149>88>60 -- ALT 19>45>127>107>90 -- Total bilirubin 0.8>>0.5>0.5>1.0>0.6 -- Discontinued Tylenol; holding home statin -- Repeat CMP in a.m.  Hypernatremia: Resolved Sodium down to 137, will discontinue D5W drip today. -- Repeat CMP in a.m.  Hypokalemia Repleted -- Repeat electrolytes in a.m.  Hypophosphatemia Repleted -- Repeat electrolytes in a.m.  Hyperlipidemia -- Hold home atorvastatin  Weakness/debility/deconditioning: -- PT/OT recommend SNF placement, TOC consulted  DVT prophylaxis: enoxaparin (LOVENOX) injection 30 mg Start: 11/12/23 1200 SCDs Start: 11/05/23 1125    Code Status: Full Code Family Communication: No family present at bedside this morning  Disposition Plan:  Level of care: Med-Surg Status is: Inpatient Remains inpatient appropriate because: IV antibiotics; will need SNF placement    Consultants:  Urology General surgery Nephrology PCCM -signed off 11/09/2023  Procedures:  Exploratory laparotomy, LOA, SBR, ligation left ureter, primary hernia repair; Dr. Carolynne Edouard, Dr. Fredricka Bonine, Dr. Alvester Morin, Dr. Berneice Heinrich; 11/05/2023  Antimicrobials:  Cefepime 2/24 - 2/25 Metronidazole 2/25 - 2/26 Ceftriaxone 2/26>>   Subjective: Patient seen examined bedside, lying in bed.  Eating breakfast.  Consumed 100% of meal.  Feels much improved,  slight abdominal pain when he coughs surrounding surgical incision site.  Seen by general surgery and further advance diet today, remove JP drain.  Continues on ceftriaxone with plan 10-day course for bacteremia.  Denies headache, no chest pain, no palpitations, no shortness of breath, no fever/chills, no nausea/vomiting, no diarrhea.  No acute events overnight per nursing staff.   Objective: Vitals:   11/11/23 1412 11/11/23 2107 11/12/23 0433 11/12/23 0437  BP: 125/61 121/61 (!) 132/56   Pulse: 71 79 77   Resp: 20 19 15    Temp: 98 F (36.7 C) 98.5 F (36.9 C) 98.8 F (37.1 C)   TempSrc: Oral Oral Oral   SpO2: 98% 98% 99%   Weight:    86.6 kg  Height:        Intake/Output Summary (Last 24 hours) at 11/12/2023 1257 Last data filed at 11/12/2023 1610 Gross per 24 hour  Intake 736.73 ml  Output 2100 ml  Net -1363.27 ml   Filed Weights   11/10/23 0714 11/11/23 0500 11/12/23 0437  Weight: 83.4 kg 86.5 kg 86.6 kg    Examination:  Physical Exam: GEN: NAD, alert and oriented x 3, ill in appearance HEENT: NCAT, PERRL, EOMI, sclera clear, MMM PULM: CTAB w/o wheezes/crackles, normal respiratory effort, on room air with SpO2 99% CV: RRR w/o M/G/R GI: abd soft, slight tenderness to palpation surrounding surgical wound, dressing noted clean/dry/intact,  + BS GU: Urostomy noted with yellow urine in collection bag MSK: no peripheral edema, moves all extremities independently NEURO: No focal neurological deficits PSYCH: normal mood/affect Integumentary: Urostomy and abdominal surgical incision site noted, otherwise no other concerning rashes/lesions/wounds noted on exposed skin surfaces    Data Reviewed: I have personally reviewed following labs and imaging studies  CBC: Recent Labs  Lab 11/07/23 0516 11/08/23 0535 11/09/23 0446 11/10/23 0751 11/11/23 0400 11/12/23 0430  WBC 13.1* 9.6 8.0 8.4 9.7 11.1*  NEUTROABS 12.6*  --   --   --   --   --  HGB 9.4* 9.1* 9.4* 10.1* 10.0*  9.2*  HCT 29.0* 28.8* 30.2* 34.0* 32.4* 29.2*  MCV 86.1 88.1 89.9 91.9 90.3 88.8  PLT 49* 53* 68* 93* 114* 134*   Basic Metabolic Panel: Recent Labs  Lab 11/08/23 0535 11/08/23 1603 11/09/23 0446 11/09/23 1718 11/10/23 0751 11/11/23 0400 11/12/23 0430  NA 145  --  148*  --  153* 146* 137  K 3.6  --  3.8  --  4.5 5.3* 3.9  CL 114*  --  116*  --  121* 117* 107  CO2 25  --  22  --  23 22 21*  GLUCOSE 120*  --  117*  --  136* 107* 109*  BUN 49*  --  51*  --  51* 45* 38*  CREATININE 2.63*  --  2.55*  --  2.31* 2.16* 2.08*  CALCIUM 7.6*  --  8.1*  --  8.4* 8.0* 7.5*  MG 2.0 2.1 2.3 2.5* 2.5*  --   --   PHOS 2.2* 2.6 2.4* 2.6 2.8  --   --    GFR: Estimated Creatinine Clearance: 30.2 mL/min (A) (by C-G formula based on SCr of 2.08 mg/dL (H)). Liver Function Tests: Recent Labs  Lab 11/08/23 0535 11/09/23 0446 11/10/23 0751 11/11/23 0400 11/12/23 0430  AST 27 81* 149* 88* 60*  ALT 20 45* 127* 107* 90*  ALKPHOS 69 256* 352* 315* 268*  BILITOT 0.5 0.5 0.5 1.0 0.6  PROT 5.0* 5.5* 5.7* 5.3* 5.0*  ALBUMIN 2.1* 2.4* 2.5* 2.4* 2.1*   No results for input(s): "LIPASE", "AMYLASE" in the last 168 hours. No results for input(s): "AMMONIA" in the last 168 hours. Coagulation Profile: Recent Labs  Lab 11/05/23 1628 11/06/23 0314 11/09/23 0446  INR 1.6* 1.8* 1.1   Cardiac Enzymes: No results for input(s): "CKTOTAL", "CKMB", "CKMBINDEX", "TROPONINI" in the last 168 hours. BNP (last 3 results) No results for input(s): "PROBNP" in the last 8760 hours. HbA1C: No results for input(s): "HGBA1C" in the last 72 hours. CBG: Recent Labs  Lab 11/11/23 1612 11/11/23 2108 11/11/23 2340 11/12/23 0429 11/12/23 0733  GLUCAP 72 91 75 103* 77   Lipid Profile: No results for input(s): "CHOL", "HDL", "LDLCALC", "TRIG", "CHOLHDL", "LDLDIRECT" in the last 72 hours. Thyroid Function Tests: No results for input(s): "TSH", "T4TOTAL", "FREET4", "T3FREE", "THYROIDAB" in the last 72 hours. Anemia  Panel: No results for input(s): "VITAMINB12", "FOLATE", "FERRITIN", "TIBC", "IRON", "RETICCTPCT" in the last 72 hours. Sepsis Labs: Recent Labs  Lab 11/05/23 1628 11/07/23 0824  LATICACIDVEN 3.0* 1.3    Recent Results (from the past 240 hours)  Urine Culture (for pregnant, neutropenic or urologic patients or patients with an indwelling urinary catheter)     Status: Abnormal   Collection Time: 11/05/23  1:02 AM   Specimen: Urine, Clean Catch  Result Value Ref Range Status   Specimen Description   Final    URINE, CLEAN CATCH Performed at [facility], [address], [place], [place] [address]    Special Requests   Final    Immunocompromised Performed at [facility], [address], [place], [place] [address]    Culture >=100,000 COLONIES/mL KLEBSIELLA PNEUMONIAE (A)  Final   Report Status 11/07/2023 FINAL  Final   Organism ID, Bacteria KLEBSIELLA PNEUMONIAE (A)  Final      Susceptibility   Klebsiella pneumoniae - MIC*    AMPICILLIN RESISTANT Resistant     CEFAZOLIN <=4 SENSITIVE Sensitive     CEFEPIME <=0.12 SENSITIVE  Sensitive     CEFTRIAXONE <=0.25 SENSITIVE Sensitive     CIPROFLOXACIN <=0.25 SENSITIVE Sensitive     GENTAMICIN <=1 SENSITIVE Sensitive     IMIPENEM <=0.25 SENSITIVE Sensitive     NITROFURANTOIN <=16 SENSITIVE Sensitive     TRIMETH/SULFA <=20 SENSITIVE Sensitive     AMPICILLIN/SULBACTAM <=2 SENSITIVE Sensitive     PIP/TAZO <=4 SENSITIVE Sensitive ug/mL    * >=100,000 COLONIES/mL KLEBSIELLA PNEUMONIAE  Culture, blood (Routine X 2) w Reflex to ID Panel     Status: Abnormal   Collection Time: 11/05/23  1:15 AM   Specimen: BLOOD  Result Value Ref Range Status   Specimen Description   Final    BLOOD RIGHT ANTECUBITAL Performed at [facility], [address], [place], [place] [address]    Special Requests   Final    BOTTLES DRAWN AEROBIC AND ANAEROBIC Blood Culture results may not be optimal due to an  inadequate volume of blood received in culture bottles Performed at [facility], [address], [place], [place] [address]    Culture  Setup Time   Final    GRAM NEGATIVE RODS IN BOTH AEROBIC AND ANAEROBIC BOTTLES CRITICAL RESULT CALLED TO, READ BACK BY AND VERIFIED WITH: PHARMD MARY SWAYNE ON 11/05/23 @ 1415 BY DRT Performed at [facility], [address] [place], [place] [address]    Culture KLEBSIELLA PNEUMONIAE (A)  Final   Report Status 11/07/2023 FINAL  Final   Organism ID, Bacteria KLEBSIELLA PNEUMONIAE  Final   Organism ID, Bacteria KLEBSIELLA PNEUMONIAE  Final      Susceptibility   Klebsiella pneumoniae - KIRBY BAUER*    CEFAZOLIN SENSITIVE Sensitive    Klebsiella pneumoniae - MIC*    AMPICILLIN RESISTANT Resistant     CEFEPIME <=0.12 SENSITIVE Sensitive     CEFTAZIDIME <=1 SENSITIVE Sensitive     CEFTRIAXONE <=0.25 SENSITIVE Sensitive     CIPROFLOXACIN <=0.25 SENSITIVE Sensitive     GENTAMICIN <=1 SENSITIVE Sensitive     IMIPENEM <=0.25 SENSITIVE Sensitive     TRIMETH/SULFA <=20 SENSITIVE Sensitive     AMPICILLIN/SULBACTAM <=2 SENSITIVE Sensitive     PIP/TAZO <=4 SENSITIVE Sensitive ug/mL    * KLEBSIELLA PNEUMONIAE    KLEBSIELLA PNEUMONIAE  Blood Culture ID Panel (Reflexed)     Status: Abnormal   Collection Time: 11/05/23  1:15 AM  Result Value Ref Range Status   Enterococcus faecalis NOT DETECTED NOT DETECTED Final   Enterococcus Faecium NOT DETECTED NOT DETECTED Final   Listeria monocytogenes NOT DETECTED NOT DETECTED Final   Staphylococcus species NOT DETECTED NOT DETECTED Final   Staphylococcus aureus (BCID) NOT DETECTED NOT DETECTED Final   Staphylococcus epidermidis NOT DETECTED NOT DETECTED Final   Staphylococcus lugdunensis NOT DETECTED NOT DETECTED Final   Streptococcus species NOT DETECTED NOT DETECTED Final   Streptococcus agalactiae NOT DETECTED NOT DETECTED Final   Streptococcus pneumoniae NOT DETECTED NOT DETECTED Final    Streptococcus pyogenes NOT DETECTED NOT DETECTED Final   A.calcoaceticus-baumannii NOT DETECTED NOT DETECTED Final   Bacteroides fragilis NOT DETECTED NOT DETECTED Final   Enterobacterales DETECTED (A) NOT DETECTED Final    Comment: Enterobacterales represent a large order of gram negative bacteria, not a single organism. CRITICAL RESULT CALLED TO, READ BACK BY AND VERIFIED WITH: PHARMD MARY SWAYNE ON 11/05/23 @ 1415 BY DRT    Enterobacter cloacae complex NOT DETECTED NOT DETECTED Final   Escherichia coli NOT DETECTED NOT DETECTED Final   Klebsiella aerogenes  NOT DETECTED NOT DETECTED Final   Klebsiella oxytoca NOT DETECTED NOT DETECTED Final   Klebsiella pneumoniae DETECTED (A) NOT DETECTED Final    Comment: CRITICAL RESULT CALLED TO, READ BACK BY AND VERIFIED WITH: PHARMD MARY SWAYNE ON 11/05/23 @ 1415 BY DRT    Proteus species NOT DETECTED NOT DETECTED Final   Salmonella species NOT DETECTED NOT DETECTED Final   Serratia marcescens NOT DETECTED NOT DETECTED Final   Haemophilus influenzae NOT DETECTED NOT DETECTED Final   Neisseria meningitidis NOT DETECTED NOT DETECTED Final   Pseudomonas aeruginosa NOT DETECTED NOT DETECTED Final   Stenotrophomonas maltophilia NOT DETECTED NOT DETECTED Final   Candida albicans NOT DETECTED NOT DETECTED Final   Candida auris NOT DETECTED NOT DETECTED Final   Candida glabrata NOT DETECTED NOT DETECTED Final   Candida krusei NOT DETECTED NOT DETECTED Final   Candida parapsilosis NOT DETECTED NOT DETECTED Final   Candida tropicalis NOT DETECTED NOT DETECTED Final   Cryptococcus neoformans/gattii NOT DETECTED NOT DETECTED Final   CTX-M ESBL NOT DETECTED NOT DETECTED Final   Carbapenem resistance IMP NOT DETECTED NOT DETECTED Final   Carbapenem resistance KPC NOT DETECTED NOT DETECTED Final   Carbapenem resistance NDM NOT DETECTED NOT DETECTED Final   Carbapenem resist OXA 48 LIKE NOT DETECTED NOT DETECTED Final   Carbapenem resistance VIM NOT DETECTED  NOT DETECTED Final    Comment: Performed at [facility], [address], [place], [place] [address]  Culture, blood (Routine X 2) w Reflex to ID Panel     Status: Abnormal   Collection Time: 11/05/23  1:16 AM   Specimen: BLOOD RIGHT HAND  Result Value Ref Range Status   Specimen Description   Final    BLOOD RIGHT HAND Performed at [facility], [address], [place], [place] [address]    Special Requests   Final    BOTTLES DRAWN AEROBIC AND ANAEROBIC Blood Culture results may not be optimal due to an inadequate volume of blood received in culture bottles Performed at [facility], [address] [place], [place] [address]    Culture  Setup Time   Final    GRAM NEGATIVE RODS AEROBIC BOTTLE ONLY CRITICAL VALUE NOTED.  VALUE IS CONSISTENT WITH PREVIOUSLY REPORTED AND CALLED VALUE.    Culture (A)  Final    KLEBSIELLA PNEUMONIAE SUSCEPTIBILITIES PERFORMED ON PREVIOUS CULTURE WITHIN THE LAST 5 DAYS. Performed at [facility], [address] [place], [place] [address]    Report Status 11/07/2023 FINAL  Final  MRSA Next Gen by PCR, Nasal     Status: None   Collection Time: 11/05/23  5:31 PM   Specimen: Nasal Mucosa; Nasal Swab  Result Value Ref Range Status   MRSA by PCR Next Gen NOT DETECTED NOT DETECTED Final    Comment: (NOTE) The GeneXpert MRSA Assay (FDA approved for NASAL specimens only), is one component of a comprehensive MRSA colonization surveillance program. It is not intended to diagnose MRSA infection nor to guide or monitor treatment for MRSA infections. Test performance is not FDA approved in patients less than 63 years old. Performed at [facility], [address] [place], [place] [address]   Resp panel by RT-PCR (RSV, Flu A&B, Covid) Anterior Nasal Swab     Status: None   Collection Time: 11/05/23  6:47 PM   Specimen: Anterior Nasal Swab  Result Value Ref Range Status   SARS Coronavirus 2 by RT PCR  NEGATIVE NEGATIVE Final    Comment: (NOTE) SARS-CoV-2 target nucleic acids are NOT DETECTED.  The SARS-CoV-2 RNA is generally detectable in upper respiratory specimens during the acute phase of infection. The lowest concentration of SARS-CoV-2 viral copies this assay can detect is 138 copies/mL. A negative result does not preclude SARS-Cov-2 infection and should not be used as the sole basis for treatment or other patient management decisions. A negative result may occur with  improper specimen collection/handling, submission of specimen other than nasopharyngeal swab, presence of viral mutation(s) within the areas targeted by this assay, and inadequate number of viral copies(<138 copies/mL). A negative result must be combined with clinical observations, patient history, and epidemiological information. The expected result is Negative.  Fact Sheet for Patients:  BloggerCourse.com  Fact Sheet for Healthcare Providers:  SeriousBroker.it  This test is no t yet approved or cleared by the [place] FDA and  has been authorized for detection and/or diagnosis of SARS-CoV-2 by FDA under an Emergency Use Authorization (EUA). This EUA will remain  in effect (meaning this test can be used) for the duration of the COVID-19 declaration under Section 564(b)(1) of the Act, 21 U.S.C.section 360bbb-3(b)(1), unless the authorization is terminated  or revoked sooner.       Influenza A by PCR NEGATIVE NEGATIVE Final   Influenza B by PCR NEGATIVE NEGATIVE Final    Comment: (NOTE) The Xpert Xpress SARS-CoV-2/FLU/RSV plus assay is intended as an aid in the diagnosis of influenza from Nasopharyngeal swab specimens and should not be used as a sole basis for treatment. Nasal washings and aspirates are unacceptable for Xpert Xpress SARS-CoV-2/FLU/RSV testing.  Fact Sheet for Patients: BloggerCourse.com  Fact Sheet for  Healthcare Providers: SeriousBroker.it  This test is not yet approved or cleared by the [place] FDA and has been authorized for detection and/or diagnosis of SARS-CoV-2 by FDA under an Emergency Use Authorization (EUA). This EUA will remain in effect (meaning this test can be used) for the duration of the COVID-19 declaration under Section 564(b)(1) of the Act, 21 U.S.C. section 360bbb-3(b)(1), unless the authorization is terminated or revoked.     Resp Syncytial Virus by PCR NEGATIVE NEGATIVE Final    Comment: (NOTE) Fact Sheet for Patients: BloggerCourse.com  Fact Sheet for Healthcare Providers: SeriousBroker.it  This test is not yet approved or cleared by the [place] FDA and has been authorized for detection and/or diagnosis of SARS-CoV-2 by FDA under an Emergency Use Authorization (EUA). This EUA will remain in effect (meaning this test can be used) for the duration of the COVID-19 declaration under Section 564(b)(1) of the Act, 21 U.S.C. section 360bbb-3(b)(1), unless the authorization is terminated or revoked.  Performed at [facility], [address] [place], [place] [address]          Radiology Studies: No results found.       Scheduled Meds:  Chlorhexidine Gluconate Cloth  6 each Topical Q2200   enoxaparin (LOVENOX) injection  30 mg Subcutaneous Q24H   feeding supplement  237 mL Oral BID BM   lidocaine  1 patch Transdermal Q24H   pantoprazole (PROTONIX) IV  40 mg Intravenous Daily   Continuous Infusions:  cefTRIAXone (ROCEPHIN)  IV Stopped (11/12/23 0553)   dextrose 75 mL/hr at 11/11/23 1418     LOS: 8 days    Time spent: 52 minutes spent on chart review, discussion with nursing staff, consultants, updating family and interview/physical exam; more than 50% of that time was spent in counseling  and/or coordination of care.    Alvira Philips [place],  DO [facility] Available via Epic secure chat 7am-7pm After these hours, please refer to coverage provider listed on [email or web address] 11/12/2023, 12:57 PM

## 2023-11-13 DIAGNOSIS — R1084 Generalized abdominal pain: Secondary | ICD-10-CM | POA: Diagnosis not present

## 2023-11-13 LAB — COMPREHENSIVE METABOLIC PANEL
ALT: 113 U/L — ABNORMAL HIGH (ref 0–44)
AST: 86 U/L — ABNORMAL HIGH (ref 15–41)
Albumin: 2.2 g/dL — ABNORMAL LOW (ref 3.5–5.0)
Alkaline Phosphatase: 267 U/L — ABNORMAL HIGH (ref 38–126)
Anion gap: 8 (ref 5–15)
BUN: 40 mg/dL — ABNORMAL HIGH (ref 8–23)
CO2: 20 mmol/L — ABNORMAL LOW (ref 22–32)
Calcium: 7.8 mg/dL — ABNORMAL LOW (ref 8.9–10.3)
Chloride: 109 mmol/L (ref 98–111)
Creatinine, Ser: 2.09 mg/dL — ABNORMAL HIGH (ref 0.61–1.24)
GFR, Estimated: 31 mL/min — ABNORMAL LOW (ref >60)
Glucose, Bld: 123 mg/dL — ABNORMAL HIGH (ref 70–99)
Potassium: 4.4 mmol/L (ref 3.5–5.1)
Sodium: 137 mmol/L (ref 135–145)
Total Bilirubin: 0.5 mg/dL (ref 0.0–1.2)
Total Protein: 5.4 g/dL — ABNORMAL LOW (ref 6.5–8.1)

## 2023-11-13 LAB — CBC
HCT: 29.4 % — ABNORMAL LOW (ref 39.0–52.0)
Hemoglobin: 9 g/dL — ABNORMAL LOW (ref 13.0–17.0)
MCH: 27.3 pg (ref 26.0–34.0)
MCHC: 30.6 g/dL (ref 30.0–36.0)
MCV: 89.1 fL (ref 80.0–100.0)
Platelets: 166 10*3/uL (ref 150–400)
RBC: 3.3 MIL/uL — ABNORMAL LOW (ref 4.22–5.81)
RDW: 13.8 % (ref 11.5–15.5)
WBC: 10.2 10*3/uL (ref 4.0–10.5)
nRBC: 0 % (ref 0.0–0.2)

## 2023-11-13 LAB — MAGNESIUM: Magnesium: 2.1 mg/dL (ref 1.7–2.4)

## 2023-11-13 LAB — PHOSPHORUS: Phosphorus: 3.3 mg/dL (ref 2.5–4.6)

## 2023-11-13 NOTE — Progress Notes (Signed)
 Progress Note  8 Days Post-Op  Subjective: NAEO. States he is feeling better. Tolerated an omelette for breakfast and reports loose, non-bloody stools   Objective: Vital signs in last 24 hours: Temp:  [98.1 F (36.7 C)-98.9 F (37.2 C)] 98.4 F (36.9 C) (03/05 0454) Pulse Rate:  [75-93] 75 (03/05 0454) Resp:  [17-22] 17 (03/05 0454) BP: (111-126)/(54-60) 111/54 (03/05 0454) SpO2:  [97 %-98 %] 97 % (03/05 0454) Weight:  [85.7 kg] 85.7 kg (03/05 0500) Last BM Date : 11/12/23  Intake/Output from previous day: 03/04 0701 - 03/05 0700 In: 600 [P.O.:600] Out: 1650 [Urine:1600; Drains:50] Intake/Output this shift: No intake/output data recorded.  PE: General: pleasant, WD, WN male who is sitting in bed in NAD Lungs: Respiratory effort nonlabored Abd: soft, appropriately ttp, ND, urostomy in RLQ and productive, midline wound with VAC present with good seal. Interval removal of JP.   Lab Results:  Recent Labs    11/12/23 0430 11/13/23 0358  WBC 11.1* 10.2  HGB 9.2* 9.0*  HCT 29.2* 29.4*  PLT 134* 166   BMET Recent Labs    11/12/23 0430 11/13/23 0358  NA 137 137  K 3.9 4.4  CL 107 109  CO2 21* 20*  GLUCOSE 109* 123*  BUN 38* 40*  CREATININE 2.08* 2.09*  CALCIUM 7.5* 7.8*   PT/INR No results for input(s): "LABPROT", "INR" in the last 72 hours.  CMP     Component Value Date/Time   NA 137 11/13/2023 0358   K 4.4 11/13/2023 0358   CL 109 11/13/2023 0358   CO2 20 (L) 11/13/2023 0358   GLUCOSE 123 (H) 11/13/2023 0358   BUN 40 (H) 11/13/2023 0358   CREATININE 2.09 (H) 11/13/2023 0358   CALCIUM 7.8 (L) 11/13/2023 0358   PROT 5.4 (L) 11/13/2023 0358   ALBUMIN 2.2 (L) 11/13/2023 0358   AST 86 (H) 11/13/2023 0358   ALT 113 (H) 11/13/2023 0358   ALKPHOS 267 (H) 11/13/2023 0358   BILITOT 0.5 11/13/2023 0358   GFRNONAA 31 (L) 11/13/2023 0358   Lipase     Component Value Date/Time   LIPASE 38 11/14/2021 1355       Studies/Results: No results  found.  Anti-infectives: Anti-infectives (From admission, onward)    Start     Dose/Rate Route Frequency Ordered Stop   11/07/23 0500  cefTRIAXone (ROCEPHIN) 2 g in sodium chloride 0.9 % 100 mL IVPB        2 g 200 mL/hr over 30 Minutes Intravenous Every 24 hours 11/06/23 1046 11/14/23 1309   11/06/23 0400  ceFEPIme (MAXIPIME) 1 g in sodium chloride 0.9 % 100 mL IVPB  Status:  Discontinued        1 g 200 mL/hr over 30 Minutes Intravenous Every 24 hours 11/05/23 0456 11/05/23 1419   11/06/23 0400  ceFEPIme (MAXIPIME) 2 g in sodium chloride 0.9 % 100 mL IVPB  Status:  Discontinued        2 g 200 mL/hr over 30 Minutes Intravenous Every 24 hours 11/05/23 1419 11/06/23 1046   11/05/23 1200  metroNIDAZOLE (FLAGYL) IVPB 500 mg  Status:  Discontinued        500 mg 100 mL/hr over 60 Minutes Intravenous Every 12 hours 11/05/23 1106 11/06/23 1046   11/05/23 0345  ceFEPIme (MAXIPIME) 2 g in sodium chloride 0.9 % 100 mL IVPB        2 g 200 mL/hr over 30 Minutes Intravenous  Once 11/05/23 0331 11/05/23 0424  Assessment/Plan  83 y/o M w/ history of bladder cancer, cystoprostatectomy and ileal conduit, ex lap for SBO in 2023 who presented with acute abdominal pain and vomiting. CT significant for SBO in RLQ. He was in septic shock in the ED and was taken emergently to the operating room where a closed loop SBO was found.  S/P EXPLORATORY LAPAROTOMY; lysis of adhesions; small bowel resection; ligation of left ureter; primary hernia repair 2/25 Dr. Carolynne Edouard (with Dr. Alvester Morin, Dr. Berneice Heinrich)  -  POD#8, afebrile, WBC 10.2 , hgb 9.0 (stable) - continue HH diet, will add ensure - VAC M-R, will discuss with TOC needs to continue this at SNF - JP drain removed 3/4 - mobilize as able  - approaching readiness for discharge from a surgical standpoint, defer medical readiness to primary attending; patient tells me that he may go to SNF tomorrow     FEN: HH diet, IVF per [facility] ID: rocephin for bacteremia per  primary Foley: none - urostomy productive VTE: Lovenox  LOS: 9 days     Adam Phenix, [facility] 11/13/2023, 10:17 AM Please see Amion for pager number during day hours 7:00am-4:30pm

## 2023-11-13 NOTE — Progress Notes (Signed)
 Physical Therapy Treatment Patient Details Name: Benjamin Hensley MRN: [id number] DOB: 1941-02-14 Today's Date: 11/13/2023   History of Present Illness 83 year old man who presented to [facility] ED 2/24 for abdominal pain. Dx: septic shock, ARF, acute met encephalopathy. S/P ex lap, SB resection, hernia repair 11/05/23.  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    PT Comments  Encouragement required for participation. Pt reported fatigue since being up with nursing this am. With time, pt agreeable to work with therapy. He tolerated walking a short distance on today. Min A overall. He is at risk for falls when mobilizing. Patient will benefit from continued inpatient follow up therapy, <3 hours/day     If plan is discharge home, recommend the following: A little help with walking and/or transfers;A little help with bathing/dressing/bathroom;Assistance with cooking/housework;Assist for transportation;Help with stairs or ramp for entrance   Can travel by private vehicle        Equipment Recommendations  None recommended by PT    Recommendations for Other Services       Precautions / Restrictions Precautions Precautions: Fall Precaution/Restrictions Comments: R urostomy,  wound vac Restrictions Weight Bearing Restrictions Per Provider Order: No     Mobility  Bed Mobility Overal bed mobility: Needs Assistance Bed Mobility: Supine to Sit     Supine to sit: Min assist, HOB elevated, Used rails     General bed mobility comments: Increased time. Cues required. Assist to balance trunk-LOB posteriorly.    Transfers Overall transfer level: Needs assistance Equipment used: Rolling walker (2 wheels) Transfers: Sit to/from Stand Sit to Stand: Min assist, From elevated  surface           General transfer comment: Cues for safety, technique, hand placement. Increased time. Assist to power up, steady, control descent.    Ambulation/Gait Ambulation/Gait assistance: Min assist Gait Distance (Feet): 15 Feet Assistive device: Rolling walker (2 wheels) Gait Pattern/deviations: Step-through pattern, Decreased stride length       General Gait Details: Cues for safety, RW proximity, increased step lengths. Assist to stabilize. Distance limited by fatigue on today.   Stairs             Wheelchair Mobility     Tilt Bed    Modified Rankin (Stroke Patients Only)       Balance Overall balance assessment: Needs assistance         Standing balance support: Bilateral upper extremity supported, During functional activity, Reliant on assistive device for balance Standing balance-Leahy Scale: Poor                              Communication Communication Communication: No apparent difficulties  Cognition Arousal: Alert Behavior During Therapy: WFL for tasks assessed/performed   PT - Cognitive impairments: History of cognitive impairments                       PT - Cognition Comments: requires cues. followed 1 step commands well.   Following commands impaired: Follows one step commands with increased time    Cueing Cueing Techniques: Verbal cues  Exercises      General Comments        Pertinent Vitals/Pain Pain Assessment Pain Assessment: No/denies pain    Home Living  Prior Function            PT Goals (current goals can now be found in the care plan section) Progress towards PT goals: Progressing toward goals    Frequency    Min 1X/week      PT Plan      Co-evaluation              AM-PAC PT "6 Clicks" Mobility   Outcome Measure  Help needed turning from your back to your side while in a flat bed without using bedrails?: A Little Help needed  moving from lying on your back to sitting on the side of a flat bed without using bedrails?: A Little Help needed moving to and from a bed to a chair (including a wheelchair)?: A Little Help needed standing up from a chair using your arms (e.g., wheelchair or bedside chair)?: A Little Help needed to walk in hospital room?: A Little Help needed climbing 3-5 steps with a railing? : A Lot 6 Click Score: 17    End of Session Equipment Utilized During Treatment: Gait belt Activity Tolerance: Patient tolerated treatment well;Patient limited by fatigue Patient left: in bed;with call bell/phone within reach;with bed alarm set   PT Visit Diagnosis: Muscle weakness (generalized) (M62.81);Difficulty in walking, not elsewhere classified (R26.2)     Time: 7829-5621 PT Time Calculation (min) (ACUTE ONLY): 20 min  Charges:    $Gait Training: 8-22 mins PT General Charges $$ ACUTE PT VISIT: 1 Visit                        Faye Ramsay, PT Acute Rehabilitation  Office: [phone]

## 2023-11-13 NOTE — TOC Progression Note (Signed)
 Transition of Care [facility]) - Progression Note    Patient Details  Name: Benjamin Hensley MRN: [id number] Date of Birth: 1941/01/23  Transition of [facility] CM/SW Contact  Larrie Kass, LCSW Phone Number: 11/13/2023, 2:19 PM  Clinical Narrative:     CSW received a call from pt's insurance , pt's Berkley Harvey was approved. CSW spoke with Annice Pih from [facility] they are still working on getting all supplies needed. TOC to follow.     Expected Discharge Plan: Skilled Nursing Facility   Expected Discharge Plan and Services       Living arrangements for the past 2 months: Independent Living Facility                                       Social Determinants of Health (SDOH) Interventions SDOH Screenings   Food Insecurity: Patient Unable To Answer (11/06/2023)  Transportation Needs: Patient Unable To Answer (11/06/2023)  Utilities: Patient Unable To Answer (11/06/2023)  Social Connections: Patient Unable To Answer (11/06/2023)  Tobacco Use: Medium Risk (11/04/2023)    Readmission Risk Interventions    11/06/2023    9:31 AM  Readmission Risk Prevention Plan  Transportation Screening Complete  PCP or Specialist Appt within 3-5 Days Complete  HRI or Home Care Consult Complete  Social Work Consult for Recovery Care Planning/Counseling Complete  Palliative Care Screening Not Applicable  Medication Review Oceanographer) Complete

## 2023-11-13 NOTE — Progress Notes (Signed)
 PT Cancellation Note  Patient Details Name: Benjamin Hensley MRN: [id number] DOB: October 04, 1940   Cancelled Treatment:    Reason Eval/Treat Not Completed: Fatigue/lethargy limiting ability to participate--RN reports pt is fatigued after ADLs. Will check back as schedule allows.    Faye Ramsay, PT Acute Rehabilitation  Office: [phone]

## 2023-11-13 NOTE — Progress Notes (Signed)
 PROGRESS NOTE    Benjamin Hensley  JXB:[id number] DOB: 07-Aug-1941 DOA: 11/04/2023 PCP: Thana Ates, MD    Brief Narrative:   Benjamin Hensley is a 83 y.o. male with past medical history significant for SBO (s/p ex-lap/SBR 02/2022), aggressive bladder/prostate CA requiring ileal conduit (followed by Urology - Dr. 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  In the ED, Tmax 103.2F, tachycardic to 100s, tachypneic to 20s-low 30s, BP 150s/80s. Labs were notable for WBC 8.3, Hgb 14.1, Plt 146. Na 131, K 4.3, CO2 21, Cr 2.71 (baseline variable), LFTs WNL. Trop unremarkable. UA with small Hgb, 100 protein, large leuks, rare bacteria. Blood Cx and Urine Cx pending.  CT abdomen/pelvis with right lower quadrant ileal conduit that is decompressed with bilateral hydronephrosis/hydroureter, left renal atrophy, concern for small bowel obstruction with possible vascular compromise causing small bowel wall thickening appears to be localized in the right lower quadrant with concern of adhesions.  Empiric broad-spectrum antibiotics (cefepime/Flagyl) started.  EDP discussed with urology and general surgery will see in consultation.  Hospitalist service consulted for admission.  Significant Hospital events: 2/24: Admit to [facility], [facility], urology consulted; started on broad-spectrum antibiotics 2/25: Persistently febrile with soft BP, rising LA to 7.1. PCCM consulted. Taken to OR for ex-lap; found to have closed-loop SBO with necrosis (resected) 2/2 tight band (nonfunctional L ureter) which was ligated. Taken to ICU postoperatively on 2 vasopressors and intubated. LIJ CVC placed for access. 2/26: Sedation weaned, extubated. 2/27: POD#2 from ex-lap/LOA and L ureteral  ligation. Remains encephalopathic and uncomfortable. Bicarb gtt discontinued. Off of Levophed. Urostomy with increased mucous/purulent material/ Plt 49, held Lovenox 2/28: Encephalopathy improving, right IJ catheter removed, creatinine improved to 2.6.  Transferred to progressive unit 3/1: Transferred to [facility]; tube feeds increased to 30 mL/h; stable; downgrade to med/surg 3/2: per CCS, ok to increase tube feeds to goal, SLP eval for swallow study 3/3: NGT/tube feeds discontinued yesterday, started on diet, remains with poor oral intake, hypoglycemic started on D5W gtt 3/4: JP drain removed and diet further advanced by general surgery, will discontinue D5 drip given increased oral intake   Assessment & Plan:   Small bowel obstruction with necrosis s/p Exlap, LOA, SBR Patient presenting to the ED with acute onset abdominal pain.  Temperature 103.5, tachycardic, tachypneic.  CT abdomen/pelvis notable for SBO with possible vascular compromise.  General surgery was consulted and patient underwent exploratory laparotomy with lysis of adhesions, small bowel resection, primary hernia repair on 11/05/2023 by Dr. Carolynne Edouard.  -- General Surgery following, appreciate assistance -- Oxycodone 5-10 mg PO q4h PRN moderate pain -- Lidocaine patch -- Fentanyl 25-50 mg IV q2h PRN severe pain -- Further per general surgery  Septic shock Klebsiella pneumonia UTI/bacteremia Urine culture and blood cultures positive for Klebsiella pneumonia with resistance to ampicillin. -- Continue ceftriaxone 2 g IV q24h; plan 10-day course  Acute metabolic encephalopathy: Improving Etiology likely secondary to critical illness, sepsis secondary to bowel obstruction with necrosis as above.  Postoperatively required vasopressor support, mechanical ventilation; which now has been discontinued.  -- Supportive care  Acute renal failure on CKD stage IV History of bladder/prostate cancer s/p ileal conduit History of recurrent  pyelonephritis s/p cystectomy Chronic left hydronephrotic kidney Patient follows with urology outpatient, Dr. Berneice Heinrich.  Does not follow  with nephrology outpatient.  Underwent cystoprostatectomy with conduit diversion initially  January 2023.  Patient underwent left ureter ligation and repair of ileal conduit by Dr. Alvester Morin on 11/05/2023.  -- Nephrology following, appreciate assistance -- Cr 2.71>>4.85>>2.63>2.55>2.31>2.16>2.09 -- BMP daily  Hypoglycemia: Resolved On transition to oral diet from tube feeds, patient developed hypoglycemia.  Supported with increased oral intake and now resolved.    Transaminitis -- AST 21>>81>149>88>60>86 -- ALT 19>45>127>107>90>113 -- Total bilirubin 0.8>>0.5>0.5>1.0>0.6>0.5 -- Discontinued Tylenol; holding home statin -- Repeat CMP in a.m.  Hypernatremia: Resolved Sodium down to 137, will discontinue D5W drip today. -- Repeat CMP in a.m.  Hypokalemia Repleted -- Repeat electrolytes in a.m.  Hypophosphatemia Repleted -- Repeat electrolytes in a.m.  Hyperlipidemia -- Hold home atorvastatin  Weakness/debility/deconditioning: -- PT/OT recommend SNF placement, TOC consulted; pending insurance authorization for friend's home  DVT prophylaxis: enoxaparin (LOVENOX) injection 30 mg Start: 11/12/23 1200 SCDs Start: 11/05/23 1125    Code Status: Full Code Family Communication: No family present at bedside this morning  Disposition Plan:  Level of care: Med-Surg Status is: Inpatient Remains inpatient appropriate because: IV antibiotics; pending insurance authorization for friend's home    Consultants:  Urology General surgery Nephrology PCCM -signed off 11/09/2023  Procedures:  Exploratory laparotomy, LOA, SBR, ligation left ureter, primary hernia repair; Dr. Carolynne Edouard, Dr. Fredricka Bonine, Dr. Alvester Morin, Dr. Berneice Heinrich; 11/05/2023  Antimicrobials:  Cefepime 2/24 - 2/25 Metronidazole 2/25 - 2/26 Ceftriaxone 2/26>>   Subjective: Patient seen examined bedside, lying  in bed.  Eating breakfast.  Overall feels improve other than generalized weakness.  Pain controlled.  Remains on ceftriaxone for 10-day IV antibiotic course, anticipate finishing tomorrow.   Denies headache, no chest pain, no palpitations, no shortness of breath, no fever/chills, no nausea/vomiting, no diarrhea.  No acute events overnight per nursing staff.  Pending insurance authorization for friend's home SNF.   Objective: Vitals:   11/12/23 1345 11/12/23 2044 11/13/23 0454 11/13/23 0500  BP: 123/60 (!) 126/55 (!) 111/54   Pulse: 80 93 75   Resp: (!) 22 18 17    Temp: 98.1 F (36.7 C) 98.9 F (37.2 C) 98.4 F (36.9 C)   TempSrc: Oral Oral Oral   SpO2: 98% 98% 97%   Weight:    85.7 kg  Height:        Intake/Output Summary (Last 24 hours) at 11/13/2023 1103 Last data filed at 11/13/2023 0500 Gross per 24 hour  Intake 360 ml  Output 1300 ml  Net -940 ml   Filed Weights   11/11/23 0500 11/12/23 0437 11/13/23 0500  Weight: 86.5 kg 86.6 kg 85.7 kg    Examination:  Physical Exam: GEN: NAD, alert and oriented x 3, ill in appearance HEENT: NCAT, PERRL, EOMI, sclera clear, MMM PULM: CTAB w/o wheezes/crackles, normal respiratory effort, on room air with SpO2 99% CV: RRR w/o M/G/R GI: abd soft, slight tenderness to palpation surrounding surgical wound, dressing noted clean/dry/intact,  + BS GU: Urostomy noted with yellow urine in collection bag MSK: no peripheral edema, moves all extremities independently NEURO: No focal neurological deficits PSYCH: normal mood/affect Integumentary: Urostomy and abdominal surgical incision site noted, otherwise no other concerning rashes/lesions/wounds noted on exposed skin surfaces    Data Reviewed: I have personally reviewed following labs and imaging studies  CBC: Recent Labs  Lab 11/07/23 0516 11/08/23 0535 11/09/23 0446 11/10/23 0751 11/11/23 0400 11/12/23 0430 11/13/23 0358  WBC 13.1*   < > 8.0 8.4 9.7 11.1* 10.2  NEUTROABS 12.6*   --   --   --   --   --   --  HGB 9.4*   < > 9.4* 10.1* 10.0* 9.2* 9.0*  HCT 29.0*   < > 30.2* 34.0* 32.4* 29.2* 29.4*  MCV 86.1   < > 89.9 91.9 90.3 88.8 89.1  PLT 49*   < > 68* 93* 114* 134* 166   < > = values in this interval not displayed.   Basic Metabolic Panel: Recent Labs  Lab 11/08/23 1603 11/09/23 0446 11/09/23 1718 11/10/23 0751 11/11/23 0400 11/12/23 0430 11/13/23 0358  NA  --  148*  --  153* 146* 137 137  K  --  3.8  --  4.5 5.3* 3.9 4.4  CL  --  116*  --  121* 117* 107 109  CO2  --  22  --  23 22 21* 20*  GLUCOSE  --  117*  --  136* 107* 109* 123*  BUN  --  51*  --  51* 45* 38* 40*  CREATININE  --  2.55*  --  2.31* 2.16* 2.08* 2.09*  CALCIUM  --  8.1*  --  8.4* 8.0* 7.5* 7.8*  MG 2.1 2.3 2.5* 2.5*  --   --  2.1  PHOS 2.6 2.4* 2.6 2.8  --   --  3.3   GFR: Estimated Creatinine Clearance: 30 mL/min (A) (by C-G formula based on SCr of 2.09 mg/dL (H)). Liver Function Tests: Recent Labs  Lab 11/09/23 0446 11/10/23 0751 11/11/23 0400 11/12/23 0430 11/13/23 0358  AST 81* 149* 88* 60* 86*  ALT 45* 127* 107* 90* 113*  ALKPHOS 256* 352* 315* 268* 267*  BILITOT 0.5 0.5 1.0 0.6 0.5  PROT 5.5* 5.7* 5.3* 5.0* 5.4*  ALBUMIN 2.4* 2.5* 2.4* 2.1* 2.2*   No results for input(s): "LIPASE", "AMYLASE" in the last 168 hours. No results for input(s): "AMMONIA" in the last 168 hours. Coagulation Profile: Recent Labs  Lab 11/09/23 0446  INR 1.1   Cardiac Enzymes: No results for input(s): "CKTOTAL", "CKMB", "CKMBINDEX", "TROPONINI" in the last 168 hours. BNP (last 3 results) No results for input(s): "PROBNP" in the last 8760 hours. HbA1C: No results for input(s): "HGBA1C" in the last 72 hours. CBG: Recent Labs  Lab 11/11/23 1612 11/11/23 2108 11/11/23 2340 11/12/23 0429 11/12/23 0733  GLUCAP 72 91 75 103* 77   Lipid Profile: No results for input(s): "CHOL", "HDL", "LDLCALC", "TRIG", "CHOLHDL", "LDLDIRECT" in the last 72 hours. Thyroid Function Tests: No  results for input(s): "TSH", "T4TOTAL", "FREET4", "T3FREE", "THYROIDAB" in the last 72 hours. Anemia Panel: No results for input(s): "VITAMINB12", "FOLATE", "FERRITIN", "TIBC", "IRON", "RETICCTPCT" in the last 72 hours. Sepsis Labs: Recent Labs  Lab 11/07/23 4098  LATICACIDVEN 1.3    Recent Results (from the past 240 hours)  Urine Culture (for pregnant, neutropenic or urologic patients or patients with an indwelling urinary catheter)     Status: Abnormal   Collection Time: 11/05/23  1:02 AM   Specimen: Urine, Clean Catch  Result Value Ref Range Status   Specimen Description   Final    URINE, CLEAN CATCH Performed at [facility], [address], [place], [place] [address]    Special Requests   Final    Immunocompromised Performed at [facility], [address], [place], [place] [address]    Culture >=100,000 COLONIES/mL KLEBSIELLA PNEUMONIAE (A)  Final   Report Status 11/07/2023 FINAL  Final   Organism ID, Bacteria KLEBSIELLA PNEUMONIAE (A)  Final      Susceptibility   Klebsiella pneumoniae - MIC*    AMPICILLIN  RESISTANT Resistant     CEFAZOLIN <=4 SENSITIVE Sensitive     CEFEPIME <=0.12 SENSITIVE Sensitive     CEFTRIAXONE <=0.25 SENSITIVE Sensitive     CIPROFLOXACIN <=0.25 SENSITIVE Sensitive     GENTAMICIN <=1 SENSITIVE Sensitive     IMIPENEM <=0.25 SENSITIVE Sensitive     NITROFURANTOIN <=16 SENSITIVE Sensitive     TRIMETH/SULFA <=20 SENSITIVE Sensitive     AMPICILLIN/SULBACTAM <=2 SENSITIVE Sensitive     PIP/TAZO <=4 SENSITIVE Sensitive ug/mL    * >=100,000 COLONIES/mL KLEBSIELLA PNEUMONIAE  Culture, blood (Routine X 2) w Reflex to ID Panel     Status: Abnormal   Collection Time: 11/05/23  1:15 AM   Specimen: BLOOD  Result Value Ref Range Status   Specimen Description   Final    BLOOD RIGHT ANTECUBITAL Performed at [facility], [address], [place], [place] [address]    Special Requests   Final     BOTTLES DRAWN AEROBIC AND ANAEROBIC Blood Culture results may not be optimal due to an inadequate volume of blood received in culture bottles Performed at [facility], [address] [place], [place] [address]    Culture  Setup Time   Final    GRAM NEGATIVE RODS IN BOTH AEROBIC AND ANAEROBIC BOTTLES CRITICAL RESULT CALLED TO, READ BACK BY AND VERIFIED WITH: PHARMD MARY SWAYNE ON 11/05/23 @ 1415 BY DRT Performed at [facility], [address], [place], [place] [address]    Culture KLEBSIELLA PNEUMONIAE (A)  Final   Report Status 11/07/2023 FINAL  Final   Organism ID, Bacteria KLEBSIELLA PNEUMONIAE  Final   Organism ID, Bacteria KLEBSIELLA PNEUMONIAE  Final      Susceptibility   Klebsiella pneumoniae - KIRBY BAUER*    CEFAZOLIN SENSITIVE Sensitive    Klebsiella pneumoniae - MIC*    AMPICILLIN RESISTANT Resistant     CEFEPIME <=0.12 SENSITIVE Sensitive     CEFTAZIDIME <=1 SENSITIVE Sensitive     CEFTRIAXONE <=0.25 SENSITIVE Sensitive     CIPROFLOXACIN <=0.25 SENSITIVE Sensitive     GENTAMICIN <=1 SENSITIVE Sensitive     IMIPENEM <=0.25 SENSITIVE Sensitive     TRIMETH/SULFA <=20 SENSITIVE Sensitive     AMPICILLIN/SULBACTAM <=2 SENSITIVE Sensitive     PIP/TAZO <=4 SENSITIVE Sensitive ug/mL    * KLEBSIELLA PNEUMONIAE    KLEBSIELLA PNEUMONIAE  Blood Culture ID Panel (Reflexed)     Status: Abnormal   Collection Time: 11/05/23  1:15 AM  Result Value Ref Range Status   Enterococcus faecalis NOT DETECTED NOT DETECTED Final   Enterococcus Faecium NOT DETECTED NOT DETECTED Final   Listeria monocytogenes NOT DETECTED NOT DETECTED Final   Staphylococcus species NOT DETECTED NOT DETECTED Final   Staphylococcus aureus (BCID) NOT DETECTED NOT DETECTED Final   Staphylococcus epidermidis NOT DETECTED NOT DETECTED Final   Staphylococcus lugdunensis NOT DETECTED NOT DETECTED Final   Streptococcus species NOT DETECTED NOT DETECTED Final   Streptococcus agalactiae NOT  DETECTED NOT DETECTED Final   Streptococcus pneumoniae NOT DETECTED NOT DETECTED Final   Streptococcus pyogenes NOT DETECTED NOT DETECTED Final   A.calcoaceticus-baumannii NOT DETECTED NOT DETECTED Final   Bacteroides fragilis NOT DETECTED NOT DETECTED Final   Enterobacterales DETECTED (A) NOT DETECTED Final    Comment: Enterobacterales represent a large order of gram negative bacteria, not a single organism. CRITICAL RESULT CALLED TO, READ BACK BY AND VERIFIED WITH: PHARMD MARY SWAYNE ON 11/05/23 @ 1415 BY DRT    Enterobacter cloacae complex NOT  DETECTED NOT DETECTED Final   Escherichia coli NOT DETECTED NOT DETECTED Final   Klebsiella aerogenes NOT DETECTED NOT DETECTED Final   Klebsiella oxytoca NOT DETECTED NOT DETECTED Final   Klebsiella pneumoniae DETECTED (A) NOT DETECTED Final    Comment: CRITICAL RESULT CALLED TO, READ BACK BY AND VERIFIED WITH: PHARMD MARY SWAYNE ON 11/05/23 @ 1415 BY DRT    Proteus species NOT DETECTED NOT DETECTED Final   Salmonella species NOT DETECTED NOT DETECTED Final   Serratia marcescens NOT DETECTED NOT DETECTED Final   Haemophilus influenzae NOT DETECTED NOT DETECTED Final   Neisseria meningitidis NOT DETECTED NOT DETECTED Final   Pseudomonas aeruginosa NOT DETECTED NOT DETECTED Final   Stenotrophomonas maltophilia NOT DETECTED NOT DETECTED Final   Candida albicans NOT DETECTED NOT DETECTED Final   Candida auris NOT DETECTED NOT DETECTED Final   Candida glabrata NOT DETECTED NOT DETECTED Final   Candida krusei NOT DETECTED NOT DETECTED Final   Candida parapsilosis NOT DETECTED NOT DETECTED Final   Candida tropicalis NOT DETECTED NOT DETECTED Final   Cryptococcus neoformans/gattii NOT DETECTED NOT DETECTED Final   CTX-M ESBL NOT DETECTED NOT DETECTED Final   Carbapenem resistance IMP NOT DETECTED NOT DETECTED Final   Carbapenem resistance KPC NOT DETECTED NOT DETECTED Final   Carbapenem resistance NDM NOT DETECTED NOT DETECTED Final   Carbapenem  resist OXA 48 LIKE NOT DETECTED NOT DETECTED Final   Carbapenem resistance VIM NOT DETECTED NOT DETECTED Final    Comment: Performed at [facility], [address], [place], [place] [address]  Culture, blood (Routine X 2) w Reflex to ID Panel     Status: Abnormal   Collection Time: 11/05/23  1:16 AM   Specimen: BLOOD RIGHT HAND  Result Value Ref Range Status   Specimen Description   Final    BLOOD RIGHT HAND Performed at [facility], [address] [place], [place] [address]    Special Requests   Final    BOTTLES DRAWN AEROBIC AND ANAEROBIC Blood Culture results may not be optimal due to an inadequate volume of blood received in culture bottles Performed at [facility], [address], [place], [place] [address]    Culture  Setup Time   Final    GRAM NEGATIVE RODS AEROBIC BOTTLE ONLY CRITICAL VALUE NOTED.  VALUE IS CONSISTENT WITH PREVIOUSLY REPORTED AND CALLED VALUE.    Culture (A)  Final    KLEBSIELLA PNEUMONIAE SUSCEPTIBILITIES PERFORMED ON PREVIOUS CULTURE WITHIN THE LAST 5 DAYS. Performed at [facility], [address] [place], [place] [address]    Report Status 11/07/2023 FINAL  Final  MRSA Next Gen by PCR, Nasal     Status: None   Collection Time: 11/05/23  5:31 PM   Specimen: Nasal Mucosa; Nasal Swab  Result Value Ref Range Status   MRSA by PCR Next Gen NOT DETECTED NOT DETECTED Final    Comment: (NOTE) The GeneXpert MRSA Assay (FDA approved for NASAL specimens only), is one component of a comprehensive MRSA colonization surveillance program. It is not intended to diagnose MRSA infection nor to guide or monitor treatment for MRSA infections. Test performance is not FDA approved in patients less than 57 years old. Performed at [facility], [address] [place], [place] [address]   Resp panel by RT-PCR (RSV, Flu A&B, Covid) Anterior Nasal Swab     Status: None   Collection Time: 11/05/23  6:47 PM    Specimen:  Anterior Nasal Swab  Result Value Ref Range Status   SARS Coronavirus 2 by RT PCR NEGATIVE NEGATIVE Final    Comment: (NOTE) SARS-CoV-2 target nucleic acids are NOT DETECTED.  The SARS-CoV-2 RNA is generally detectable in upper respiratory specimens during the acute phase of infection. The lowest concentration of SARS-CoV-2 viral copies this assay can detect is 138 copies/mL. A negative result does not preclude SARS-Cov-2 infection and should not be used as the sole basis for treatment or other patient management decisions. A negative result may occur with  improper specimen collection/handling, submission of specimen other than nasopharyngeal swab, presence of viral mutation(s) within the areas targeted by this assay, and inadequate number of viral copies(<138 copies/mL). A negative result must be combined with clinical observations, patient history, and epidemiological information. The expected result is Negative.  Fact Sheet for Patients:  BloggerCourse.com  Fact Sheet for Healthcare Providers:  SeriousBroker.it  This test is no t yet approved or cleared by the [place] FDA and  has been authorized for detection and/or diagnosis of SARS-CoV-2 by FDA under an Emergency Use Authorization (EUA). This EUA will remain  in effect (meaning this test can be used) for the duration of the COVID-19 declaration under Section 564(b)(1) of the Act, 21 U.S.C.section 360bbb-3(b)(1), unless the authorization is terminated  or revoked sooner.       Influenza A by PCR NEGATIVE NEGATIVE Final   Influenza B by PCR NEGATIVE NEGATIVE Final    Comment: (NOTE) The Xpert Xpress SARS-CoV-2/FLU/RSV plus assay is intended as an aid in the diagnosis of influenza from Nasopharyngeal swab specimens and should not be used as a sole basis for treatment. Nasal washings and aspirates are unacceptable for Xpert Xpress  SARS-CoV-2/FLU/RSV testing.  Fact Sheet for Patients: BloggerCourse.com  Fact Sheet for Healthcare Providers: SeriousBroker.it  This test is not yet approved or cleared by the [place] FDA and has been authorized for detection and/or diagnosis of SARS-CoV-2 by FDA under an Emergency Use Authorization (EUA). This EUA will remain in effect (meaning this test can be used) for the duration of the COVID-19 declaration under Section 564(b)(1) of the Act, 21 U.S.C. section 360bbb-3(b)(1), unless the authorization is terminated or revoked.     Resp Syncytial Virus by PCR NEGATIVE NEGATIVE Final    Comment: (NOTE) Fact Sheet for Patients: BloggerCourse.com  Fact Sheet for Healthcare Providers: SeriousBroker.it  This test is not yet approved or cleared by the [place] FDA and has been authorized for detection and/or diagnosis of SARS-CoV-2 by FDA under an Emergency Use Authorization (EUA). This EUA will remain in effect (meaning this test can be used) for the duration of the COVID-19 declaration under Section 564(b)(1) of the Act, 21 U.S.C. section 360bbb-3(b)(1), unless the authorization is terminated or revoked.  Performed at [facility], [address], [place], [place] [address]          Radiology Studies: No results found.       Scheduled Meds:  Chlorhexidine Gluconate Cloth  6 each Topical Q2200   enoxaparin (LOVENOX) injection  30 mg Subcutaneous Q24H   feeding supplement  237 mL Oral BID BM   lidocaine  1 patch Transdermal Q24H   pantoprazole  40 mg Oral Daily   Continuous Infusions:  cefTRIAXone (ROCEPHIN)  IV 2 g (11/13/23 0600)     LOS: 9 days    Time spent: 48 minutes spent on chart review, discussion with nursing staff, consultants, updating family and interview/physical exam; more than  50% of that time was spent in  counseling and/or coordination of care.    Alvira Philips [place], DO [facility] Available via Epic secure chat 7am-7pm After these hours, please refer to coverage provider listed on [email or web address] 11/13/2023, 11:03 AM

## 2023-11-14 DIAGNOSIS — I129 Hypertensive chronic kidney disease with stage 1 through stage 4 chronic kidney disease, or unspecified chronic kidney disease: Secondary | ICD-10-CM | POA: Diagnosis not present

## 2023-11-14 DIAGNOSIS — N1832 Chronic kidney disease, stage 3b: Secondary | ICD-10-CM | POA: Diagnosis not present

## 2023-11-14 DIAGNOSIS — Z936 Other artificial openings of urinary tract status: Secondary | ICD-10-CM | POA: Diagnosis not present

## 2023-11-14 DIAGNOSIS — E119 Type 2 diabetes mellitus without complications: Secondary | ICD-10-CM | POA: Diagnosis not present

## 2023-11-14 DIAGNOSIS — C672 Malignant neoplasm of lateral wall of bladder: Secondary | ICD-10-CM | POA: Diagnosis not present

## 2023-11-14 DIAGNOSIS — N139 Obstructive and reflux uropathy, unspecified: Secondary | ICD-10-CM | POA: Diagnosis not present

## 2023-11-14 DIAGNOSIS — K56609 Unspecified intestinal obstruction, unspecified as to partial versus complete obstruction: Secondary | ICD-10-CM | POA: Diagnosis not present

## 2023-11-14 DIAGNOSIS — Z48815 Encounter for surgical aftercare following surgery on the digestive system: Secondary | ICD-10-CM | POA: Diagnosis not present

## 2023-11-14 DIAGNOSIS — Z8551 Personal history of malignant neoplasm of bladder: Secondary | ICD-10-CM | POA: Diagnosis not present

## 2023-11-14 DIAGNOSIS — F411 Generalized anxiety disorder: Secondary | ICD-10-CM | POA: Diagnosis not present

## 2023-11-14 DIAGNOSIS — N1831 Chronic kidney disease, stage 3a: Secondary | ICD-10-CM | POA: Diagnosis not present

## 2023-11-14 DIAGNOSIS — N179 Acute kidney failure, unspecified: Secondary | ICD-10-CM | POA: Diagnosis not present

## 2023-11-14 DIAGNOSIS — R2681 Unsteadiness on feet: Secondary | ICD-10-CM | POA: Diagnosis not present

## 2023-11-14 DIAGNOSIS — R531 Weakness: Secondary | ICD-10-CM | POA: Diagnosis not present

## 2023-11-14 DIAGNOSIS — F339 Major depressive disorder, recurrent, unspecified: Secondary | ICD-10-CM | POA: Diagnosis not present

## 2023-11-14 DIAGNOSIS — E569 Vitamin deficiency, unspecified: Secondary | ICD-10-CM | POA: Diagnosis not present

## 2023-11-14 DIAGNOSIS — K56699 Other intestinal obstruction unspecified as to partial versus complete obstruction: Secondary | ICD-10-CM | POA: Diagnosis not present

## 2023-11-14 DIAGNOSIS — A419 Sepsis, unspecified organism: Secondary | ICD-10-CM | POA: Diagnosis not present

## 2023-11-14 DIAGNOSIS — R52 Pain, unspecified: Secondary | ICD-10-CM | POA: Diagnosis not present

## 2023-11-14 DIAGNOSIS — R6521 Severe sepsis with septic shock: Secondary | ICD-10-CM | POA: Diagnosis not present

## 2023-11-14 DIAGNOSIS — Z7401 Bed confinement status: Secondary | ICD-10-CM | POA: Diagnosis not present

## 2023-11-14 DIAGNOSIS — D638 Anemia in other chronic diseases classified elsewhere: Secondary | ICD-10-CM | POA: Diagnosis not present

## 2023-11-14 DIAGNOSIS — L219 Seborrheic dermatitis, unspecified: Secondary | ICD-10-CM | POA: Diagnosis not present

## 2023-11-14 DIAGNOSIS — R1084 Generalized abdominal pain: Secondary | ICD-10-CM | POA: Diagnosis not present

## 2023-11-14 DIAGNOSIS — R7401 Elevation of levels of liver transaminase levels: Secondary | ICD-10-CM | POA: Diagnosis not present

## 2023-11-14 DIAGNOSIS — I10 Essential (primary) hypertension: Secondary | ICD-10-CM | POA: Diagnosis not present

## 2023-11-14 DIAGNOSIS — N184 Chronic kidney disease, stage 4 (severe): Secondary | ICD-10-CM | POA: Diagnosis not present

## 2023-11-14 DIAGNOSIS — D631 Anemia in chronic kidney disease: Secondary | ICD-10-CM | POA: Diagnosis not present

## 2023-11-14 DIAGNOSIS — N189 Chronic kidney disease, unspecified: Secondary | ICD-10-CM | POA: Diagnosis not present

## 2023-11-14 DIAGNOSIS — E440 Moderate protein-calorie malnutrition: Secondary | ICD-10-CM | POA: Diagnosis not present

## 2023-11-14 DIAGNOSIS — E785 Hyperlipidemia, unspecified: Secondary | ICD-10-CM | POA: Diagnosis not present

## 2023-11-14 DIAGNOSIS — K219 Gastro-esophageal reflux disease without esophagitis: Secondary | ICD-10-CM | POA: Diagnosis not present

## 2023-11-14 LAB — COMPREHENSIVE METABOLIC PANEL
ALT: 108 U/L — ABNORMAL HIGH (ref 0–44)
AST: 64 U/L — ABNORMAL HIGH (ref 15–41)
Albumin: 2.2 g/dL — ABNORMAL LOW (ref 3.5–5.0)
Alkaline Phosphatase: 238 U/L — ABNORMAL HIGH (ref 38–126)
Anion gap: 6 (ref 5–15)
BUN: 38 mg/dL — ABNORMAL HIGH (ref 8–23)
CO2: 22 mmol/L (ref 22–32)
Calcium: 8.2 mg/dL — ABNORMAL LOW (ref 8.9–10.3)
Chloride: 111 mmol/L (ref 98–111)
Creatinine, Ser: 2.12 mg/dL — ABNORMAL HIGH (ref 0.61–1.24)
GFR, Estimated: 31 mL/min — ABNORMAL LOW (ref >60)
Glucose, Bld: 107 mg/dL — ABNORMAL HIGH (ref 70–99)
Potassium: 4.4 mmol/L (ref 3.5–5.1)
Sodium: 139 mmol/L (ref 135–145)
Total Bilirubin: 0.6 mg/dL (ref 0.0–1.2)
Total Protein: 5.8 g/dL — ABNORMAL LOW (ref 6.5–8.1)

## 2023-11-14 LAB — CBC
HCT: 29.5 % — ABNORMAL LOW (ref 39.0–52.0)
Hemoglobin: 9.1 g/dL — ABNORMAL LOW (ref 13.0–17.0)
MCH: 27.6 pg (ref 26.0–34.0)
MCHC: 30.8 g/dL (ref 30.0–36.0)
MCV: 89.4 fL (ref 80.0–100.0)
Platelets: 193 10*3/uL (ref 150–400)
RBC: 3.3 MIL/uL — ABNORMAL LOW (ref 4.22–5.81)
RDW: 13.7 % (ref 11.5–15.5)
WBC: 9 10*3/uL (ref 4.0–10.5)
nRBC: 0 % (ref 0.0–0.2)

## 2023-11-14 MED ORDER — LIDOCAINE 5 % EX PTCH: 1.0000 | MEDICATED_PATCH | CUTANEOUS | 0 refills | Status: DC

## 2023-11-14 MED ORDER — PANTOPRAZOLE SODIUM 40 MG PO TBEC: 40.0000 mg | DELAYED_RELEASE_TABLET | Freq: Every day | ORAL | 0 refills | Status: AC

## 2023-11-14 MED ORDER — OXYCODONE HCL 5 MG PO TABS: 5.0000 mg | ORAL_TABLET | Freq: Three times a day (TID) | ORAL | 0 refills | Status: DC | PRN

## 2023-11-14 NOTE — Progress Notes (Signed)
 Patient discharging to [facility].  Report called to Robin at facility and AVS placed in packet. Patient in NAD at this time.  Will leave VAC dressing in place as it will continue at facility.

## 2023-11-14 NOTE — TOC Transition Note (Signed)
 Transition of Care [facility]) - Discharge Note   Patient Details  Name: Benjamin Hensley MRN: [id number] Date of Birth: [birth year]  [facility]) CM/SW Contact:  Larrie Kass, LCSW Phone Number: 11/14/2023, 11:04 AM   Clinical Narrative:    Pt to d/c to [facility] for short term rehab. [name] room number 51, RN to call report to [phone] ext [phone]. PTAR called, TOC sign off.    Final next level of care: Skilled Nursing Facility Barriers to Discharge: Barriers Resolved   Patient Goals and CMS Choice Patient states their goals for this hospitalization and ongoing recovery are:: SNf to get stonger CMS Medicare.gov Compare Post Acute Care list provided to:: Patient Represenative (must comment) Choice offered to / list presented to : Spouse      Discharge Placement                Patient to be transferred to facility by: EMS   Patient and family notified of of transfer: 11/14/23  Discharge Plan and Services Additional resources added to the After Visit Summary for                                       Social Drivers of Health (SDOH) Interventions SDOH Screenings   Food Insecurity: Patient Unable To Answer (11/06/2023)  Transportation Needs: Patient Unable To Answer (11/06/2023)  Utilities: Patient Unable To Answer (11/06/2023)  Social Connections: Patient Unable To Answer (11/06/2023)  Tobacco Use: Medium Risk (11/04/2023)     Readmission Risk Interventions    11/06/2023    9:31 AM  Readmission Risk Prevention Plan  Transportation Screening Complete  PCP or Specialist Appt within 3-5 Days Complete  HRI or Home Care Consult Complete  Social Work Consult for Recovery Care Planning/Counseling Complete  Palliative Care Screening Not Applicable  Medication Review Oceanographer) Complete

## 2023-11-14 NOTE — Discharge Summary (Signed)
 Physician Discharge Summary  [name] WGN:[id number] DOB: Apr 15, 1941 DOA: 11/04/2023  PCP: Thana Ates, MD  Admit date: 11/04/2023 Discharge date: 11/14/2023  Admitted From: [facility] Disposition: [facility]  Recommendations for Outpatient Follow-up:  Follow up with PCP in 1-2 weeks Follow-up with general surgery as scheduled Outpatient follow-up with urology, Dr. Berneice Heinrich Continue wound [facility] with twice weekly changes on Monday and Thursday, last changed on 11/14/2023 Continue to hold home statin (atorvastatin) for elevated LFTs, recommend repeat CMP in 1-2 weeks before consideration of restarting Avoid all Tylenol, NSAIDs given his elevated LFTs and chronic kidney disease Please obtain CMP/CBC in one week   Discharge Condition: Stable CODE STATUS: Full code Diet recommendation: Heart healthy diet  History of present illness:  Benjamin Hensley is a 83 y.o. male with past medical history significant for SBO (s/p ex-lap/SBR 02/2022), aggressive bladder/prostate CA requiring ileal conduit (followed by Urology - Dr. 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   In the ED, Tmax 103.18F, tachycardic to 100s, tachypneic to 20s-low 30s, BP 150s/80s. Labs were notable for WBC 8.3, Hgb 14.1, Plt 146. Na 131, K 4.3, CO2 21, Cr 2.71 (baseline variable), LFTs WNL. Trop unremarkable. UA with small Hgb, 100 protein, large leuks, rare bacteria. Blood Cx and Urine Cx pending.  CT abdomen/pelvis with right lower quadrant ileal conduit that is decompressed with bilateral hydronephrosis/hydroureter, left renal atrophy, concern for small bowel obstruction with possible vascular compromise causing small bowel wall thickening  appears to be localized in the right lower quadrant with concern of adhesions.  Empiric broad-spectrum antibiotics (cefepime/Flagyl) started.  EDP discussed with urology and general surgery will see in consultation.  Hospitalist service consulted for admission.   Significant Hospital events: 2/24: Admit to [facility], [facility], urology consulted; started on broad-spectrum antibiotics 2/25: Persistently febrile with soft BP, rising LA to 7.1. PCCM consulted. Taken to OR for ex-lap; found to have closed-loop SBO with necrosis (resected) 2/2 tight band (nonfunctional L ureter) which was ligated. Taken to ICU postoperatively on 2 vasopressors and intubated. LIJ CVC placed for access. 2/26: Sedation weaned, extubated. 2/27: POD#2 from ex-lap/LOA and L ureteral ligation. Remains encephalopathic and uncomfortable. Bicarb gtt discontinued. Off of Levophed. Urostomy with increased mucous/purulent material/ Plt 49, held Lovenox 2/28: Encephalopathy improving, right IJ catheter removed, creatinine improved to 2.6.  Transferred to progressive unit 3/1: Transferred to [facility]; tube feeds increased to 30 mL/h; stable; downgrade to med/surg 3/2: per [facility], ok to increase tube feeds to goal, SLP eval for swallow study 3/3: NGT/tube feeds discontinued yesterday, started on diet, remains with poor oral intake, hypoglycemic started on D5W gtt 3/4: JP drain removed and diet further advanced by general surgery, will discontinue D5 drip given increased oral intake 3/6: To complete IV antibiotics today, [facility]  Hospital course:  Small bowel obstruction with necrosis s/p Exlap, LOA, SBR Patient presenting to the ED with acute onset abdominal pain.  Temperature 103.5, tachycardic, tachypneic.  CT abdomen/pelvis notable for SBO with possible vascular compromise.  General surgery was consulted and patient underwent exploratory laparotomy with lysis of adhesions, small bowel resection, primary hernia repair on  11/05/2023 by Dr. Carolynne Edouard.  Patient initially required tube feeds which was slowly titrated off with up titration of oral  intake in which she was tolerating well at time of discharge.  Continue wound VAC with twice weekly changes on Monday and Thursdays, last changed on 11/14/2023.  Continue oxycodone 5 mg p.o. every 8 hours as needed moderate pain.  Avoid Tylenol and NSAIDs given his elevated LFTs and renal dysfunction.  Outpatient follow-up with urology and general surgery.   Septic shock Klebsiella pneumonia UTI/bacteremia Urine culture and blood cultures positive for Klebsiella pneumonia with resistance to ampicillin.  Completed 10-day course of IV ceftriaxone on 11/14/2023.   Acute metabolic encephalopathy: Resolved Etiology likely secondary to critical illness, sepsis secondary to bowel obstruction with necrosis as above.  Postoperatively required vasopressor support, mechanical ventilation; which now has been discontinued.  Mental status improved during hospital course and now back at his typical baseline.   Acute renal failure on CKD stage IV History of bladder/prostate cancer s/p ileal conduit History of recurrent pyelonephritis s/p cystectomy Chronic left hydronephrotic kidney Patient follows with urology outpatient, Dr. Berneice Heinrich.  Does not follow with nephrology outpatient.  Underwent cystoprostatectomy with conduit diversion initially  January 2023.  Patient underwent left ureter ligation and repair of ileal conduit by Dr. Alvester Morin on 11/05/2023.  Initially seen by nephrology now signed off with downtrend of creatinine.  Creatinine peaked at 4.85 and improved to 2.12 at time of discharge.  Baseline reported at 2.5.  Outpatient follow-up with urology Dr. Berneice Heinrich   Hypoglycemia: Resolved On transition to oral diet from tube feeds, patient developed hypoglycemia.  Supported with increased oral intake and now resolved.     Transaminitis Etiology likely secondary to critical illness as well as Tylenol and  statin use.  Avoid all Tylenol products, holding home statin on discharge.  LFTs trending down.  Recommend repeat CMP 1-2 weeks before consideration of restarting atorvastatin.  Hypernatremia: Resolved Sodium down to 139, D5W drip discontinued.   Hypokalemia Repleted during hospitalization.   Hypophosphatemia Repleted during hospitalization.   Hyperlipidemia Hold home atorvastatin on discharge until follow-up with PCP with repeat CMP before consideration of restarting.   Weakness/debility/deconditioning: Seen by PT OT during hospitalization with recommendation of SNF placement.  [facility]  Discharge Diagnoses:  Principal Problem:   Generalized abdominal pain Active Problems:   AKI (acute kidney injury) (HCC)   Sepsis (HCC)   SBO (small bowel obstruction) (HCC)   Shock (HCC)   Small bowel obstruction (HCC)   Protein-calorie malnutrition, moderate (HCC)    Discharge Instructions  Discharge Instructions     Call MD for:  difficulty breathing, headache or visual disturbances   Complete by: As directed    Call MD for:  extreme fatigue   Complete by: As directed    Call MD for:  persistant dizziness or light-headedness   Complete by: As directed    Call MD for:  persistant nausea and vomiting   Complete by: As directed    Call MD for:  severe uncontrolled pain   Complete by: As directed    Call MD for:  temperature >100.4   Complete by: As directed    Diet - low sodium heart healthy   Complete by: As directed    Discharge wound care:   Complete by: As directed    Wound VAC changes twice weekly on Monday and Thursday   Increase activity slowly   Complete by: As directed       Allergies as of 11/14/2023   No Known Allergies      Medication List     PAUSE  taking these medications    atorvastatin 10 MG tablet Wait to take this until your doctor or other care provider tells you to start again. Commonly known as: LIPITOR Take 5 mg by  mouth in the morning.       STOP taking these medications    acetaminophen 500 MG tablet Commonly known as: TYLENOL       TAKE these medications    ketoconazole 2 % shampoo Commonly known as: NIZORAL Apply 1 application  topically as needed for irritation.   lidocaine 5 % Commonly known as: LIDODERM Place 1 patch onto the skin daily. Remove & Discard patch within 12 hours or as directed by MD   MULTIVITAMIN GUMMIES ADULT PO Take 1 tablet by mouth in the morning.   oxyCODONE 5 MG immediate release tablet Commonly known as: Roxicodone Take 1 tablet (5 mg total) by mouth every 8 (eight) hours as needed for moderate pain (pain score 4-6).   pantoprazole 40 MG tablet Commonly known as: PROTONIX Take 1 tablet (40 mg total) by mouth daily. Start taking on: November 15, 2023   sertraline 50 MG tablet Commonly known as: ZOLOFT Take 50 mg by mouth in the morning.               Durable Medical Equipment  (From admission, onward)           Start     Ordered   11/12/23 1215  For home use only DME Negative pressure wound device  Once       Question Answer Comment  Frequency of dressing change 2 times per week   Length of need 3 Months   Dressing type Foam   Amount of suction 125 mm/Hg   Pressure application Continuous pressure   Supplies 10 canisters and 15 dressings per month for duration of therapy      11/12/23 1215              Discharge Care Instructions  (From admission, onward)           Start     Ordered   11/14/23 0000  Discharge wound care:       Comments: Wound VAC changes twice weekly on Monday and Thursday   11/14/23 1100            Follow-up Information     Connect with your PCP/Specialist as discussed. Schedule an appointment as soon as possible for a visit .   Contact information: [email or web address] Call our physician referral line at [phone].        Griselda Miner, MD Follow up.    Specialty: General Surgery Why: our office is scheduling you for post-operative follow up in 3-4 weeks. call to confirm appointment date/time. Contact information: [address] [place] [address] [phone]         Loletta Parish., MD. Schedule an appointment as soon as possible for a visit in 2 week(s).   Specialty: Urology Contact information: [address] [place] [address] [phone]                No Known Allergies  Consultations: Urology General surgery Nephrology PCCM    Procedures/Studies: DG CHEST PORT 1 VIEW Result Date: 11/09/2023 CLINICAL DATA:  Acute hypoxemic respiratory failure. EXAM: PORTABLE CHEST 1 VIEW COMPARISON:  One-view chest x-ray 11/06/2023 FINDINGS: The patient has been extubated. Gastric tube courses off the inferior border of the film. Previously noted left IJ line was removed.  Aeration of both lungs is improved. Lung volumes remain low. Improved bibasilar airspace opacities remain, right greater than left. A small right pleural effusion is suspected. IMPRESSION: 1. Interval extubation. 2. Improved aeration of both lungs. 3. Improved bibasilar airspace disease, ill-defined opacities remain, right greater than left. 4. Small right pleural effusion. Electronically Signed   By: Marin Roberts M.D.   On: 11/09/2023 11:53   Portable Chest xray Result Date: 11/06/2023 CLINICAL DATA:  Endotracheal tube placement. EXAM: PORTABLE CHEST 1 VIEW COMPARISON:  November 05, 2023. FINDINGS: The heart size and mediastinal contours are within normal limits. Endotracheal and nasogastric tubes are unchanged in position. Left internal jugular catheter is unchanged. Increased bibasilar opacities are noted concerning for worsening atelectasis or infiltrates. The visualized skeletal structures are unremarkable. IMPRESSION: Stable support apparatus. Increased bibasilar opacities as noted above. Electronically Signed   By: Lupita Raider M.D.   On: 11/06/2023 09:39   DG Chest Port 1 View Result Date: 11/05/2023 CLINICAL DATA:  Line placement EXAM: PORTABLE CHEST 1 VIEW COMPARISON:  11/05/2023 earlier FINDINGS: Stable ET tube and enteric tube. Left IJ catheter in place with tip along the central SVC above the right atrium. No pneumothorax. Stable enlarged cardiopericardial silhouette with some prominence of central vasculature. Persistent right lung base opacity with small right effusion. Dense right upper lung nodule consistent with old granulomatous disease. IMPRESSION: New left IJ catheter.  No pneumothorax. Electronically Signed   By: Karen Kays M.D.   On: 11/05/2023 17:58   Portable Chest x-ray Result Date: 11/05/2023 CLINICAL DATA:  Intubation. EXAM: PORTABLE CHEST 1 VIEW COMPARISON:  Radiographs 11/04/2023 and 08/13/2023. FINDINGS: 1619 hours. Interval intubation. Tip of the endotracheal tube overlies the mid trachea. Enteric tube projects below the diaphragm, tip not visualized. The heart size and mediastinal contours are stable allowing for supine technique. Mildly increased opacities at both lung bases, greater on the right. No evidence of pneumothorax or significant pleural effusion. Unchanged calcified right upper lobe granuloma. No acute osseous findings. IMPRESSION: 1. Satisfactory position of support system. 2. Mildly increased bibasilar opacities, greater on the right, which could reflect atelectasis or pneumonia. Electronically Signed   By: Carey Bullocks M.D.   On: 11/05/2023 17:09   DG Abd Portable 1V-Small Bowel Obstruction Protocol-initial, 8 hr delay Result Date: 11/05/2023 CLINICAL DATA:  Small-bowel obstruction.  8 hour delayed film EXAM: PORTABLE ABDOMEN - 1 VIEW COMPARISON:  11/05/2023 at 1 a.m. FINDINGS: Abdominal x-ray obtained 8 hours after administration of small amount of oral contrast demonstrates a small amount of contrast in the fundus of the stomach. Enteric tube extending to the right of the  spine, likely along the distal stomach. There is a right lower quadrant ostomy. The large bowel has scattered stool and is nondilated. No colonic contrast. There are several distended loops of small bowel in the mid abdomen with central loop measuring up to 3.3 cm in diameter. No contrast in the small bowel. Curvature and moderate degenerative changes of the spine. IMPRESSION: Small amount of contrast overlying the fundus of the stomach. No significant other areas of small large bowel contrast. Persistent dilated loops of small bowel in the mid abdomen. Right-sided ostomy.  Diffuse scattered colonic stool. NG tube Electronically Signed   By: Karen Kays M.D.   On: 11/05/2023 13:36   DG Abd Portable 1V-Small Bowel Protocol-Position Verification Result Date: 11/05/2023 CLINICAL DATA:  NG tube placement EXAM: PORTABLE ABDOMEN - 1 VIEW COMPARISON:  CT today. FINDINGS: NG tube tip is  in the stomach with the side port near the GE junction. IMPRESSION: NG tube in the proximal stomach. Electronically Signed   By: Charlett Nose M.D.   On: 11/05/2023 01:37   CT ABDOMEN PELVIS WO CONTRAST Result Date: 11/04/2023 CLINICAL DATA:  Acute nonlocalized abdominal pain. Worsening right upper quadrant abdominal pain tonight. Urostomy not draining. EXAM: CT ABDOMEN AND PELVIS WITHOUT CONTRAST TECHNIQUE: Multidetector CT imaging of the abdomen and pelvis was performed following the standard protocol without IV contrast. RADIATION DOSE REDUCTION: This exam was performed according to the departmental dose-optimization program which includes automated exposure control, adjustment of the mA and/or kV according to patient size and/or use of iterative reconstruction technique. COMPARISON:  08/15/2023 FINDINGS: Lower chest: Mild atelectasis in the lung bases. Small esophageal hiatal hernia. Hepatobiliary: Minimal free fluid along the anterior liver edge. No focal liver lesions. Gallbladder and bile ducts are unremarkable. Pancreas:  Unremarkable. No pancreatic ductal dilatation or surrounding inflammatory changes. Spleen: Calcified granulomas in the spleen.  Normal-size. Adrenals/Urinary Tract: No adrenal gland nodules. Diffuse parenchymal atrophy involving the left kidney. Bilateral hydronephrosis and hydroureter. A right lower quadrant urinary conduit is present. The urinary conduit is decompressed. Bladder is surgically absent. Stomach/Bowel: Stomach is fluid-filled without abnormal distention. Proximal small bowel are decompressed. Mid abdominal and right lower quadrant small bowel loops are dilated and fluid-filled with thickened wall. There appears to be tethering in the right lower quadrant likely indicating an area of adhesions. This is near the apex of the ileal conduit. Scattered stool throughout the colon. There is a ventral abdominal wall hernia at the umbilicus containing fat, cecum, and terminal ileum. There is no obvious obstruction at this level. Multiple areas of surgical anastomosis are demonstrated in small bowel. Vascular/Lymphatic: Aortic atherosclerosis. No enlarged abdominal or pelvic lymph nodes. Reproductive: Prostate gland appears to be surgically absent. Other: Small amount of free fluid in the mesentery surrounding dilated bowel loops. No free air. Musculoskeletal: Degenerative changes in the spine. No acute bony abnormalities. IMPRESSION: 1. Right lower quadrant ileal conduit. Ileal conduit is decompressed and there is bilateral hydronephrosis and hydroureter. Left renal atrophy. 2. Dilated mid to lower abdominal small bowel with small bowel wall thickening in this area and mesenteric fluid. No pneumatosis. 3. Changes are likely to represent small bowel obstruction with possible vascular compromise causing small-bowel wall thickening. Level of obstruction appears to be in the right lower quadrant, likely adhesions. Obstruction may also be involving the proximal ileal conduit. 4. Aortic atherosclerosis. These results  were called by telephone at the time of interpretation on 11/04/2023 at 9:41 pm to provider [facility] , who verbally acknowledged these results. Electronically Signed   By: Burman Nieves M.D.   On: 11/04/2023 21:45   DG Chest Portable 1 View Result Date: 11/04/2023 CLINICAL DATA:  Worsening right upper quadrant pain EXAM: PORTABLE CHEST 1 VIEW COMPARISON:  08/13/2023 FINDINGS: Single frontal view of the chest demonstrates an unremarkable cardiac silhouette. No airspace disease, effusion, or pneumothorax. No acute bony abnormalities. IMPRESSION: 1. No acute intrathoracic process. Electronically Signed   By: Sharlet Salina M.D.   On: 11/04/2023 21:10     Subjective: Seen examined bedside, eating breakfast.  No complaints this morning.  Wound VAC exchange by general surgery this morning.  Discharging to SNF today.  Updated patient's son via telephone this morning.  No other questions or concerns at this time.  Denies headache, no visual changes, no chest pain, no palpitations, no shortness of breath, no abdominal pain, no  fever/chills/night sweats, no nausea/vomiting/diarrhea, no focal weakness, no fatigue, no paresthesias.  No acute events overnight per nurse staff.  Discharge Exam: Vitals:   11/13/23 1940 11/14/23 0453  BP: 108/67 (!) 114/59  Pulse: 81 80  Resp: 18 19  Temp: 98.2 F (36.8 C) 98.4 F (36.9 C)  SpO2: 97% 100%   Vitals:   11/13/23 1443 11/13/23 1940 11/14/23 0453 11/14/23 0500  BP: 118/62 108/67 (!) 114/59   Pulse: (!) 101 81 80   Resp: 20 18 19    Temp: 98.2 F (36.8 C) 98.2 F (36.8 C) 98.4 F (36.9 C)   TempSrc: Oral Oral Oral   SpO2: 98% 97% 100%   Weight:    85.7 kg  Height:        Physical Exam: GEN: NAD, alert and oriented x 3, ill in appearance HEENT: NCAT, PERRL, EOMI, sclera clear, MMM PULM: CTAB w/o wheezes/crackles, normal respiratory effort, on room air with SpO2 99% CV: RRR w/o M/G/R GI: abd soft, wound VAC noted w/ slight tenderness  surrounding, nondistended,  + BS GU: Urostomy noted with yellow urine in collection bag MSK: no peripheral edema, moves all extremities independently NEURO: No focal neurological deficits PSYCH: normal mood/affect Integumentary: Urostomy and wound VAC site noted, otherwise no other concerning rashes/lesions/wounds noted on exposed skin surfaces    The results of significant diagnostics from this hospitalization (including imaging, microbiology, ancillary and laboratory) are listed below for reference.     Microbiology: Recent Results (from the past 240 hours)  Urine Culture (for pregnant, neutropenic or urologic patients or patients with an indwelling urinary catheter)     Status: Abnormal   Collection Time: 11/05/23  1:02 AM   Specimen: Urine, Clean Catch  Result Value Ref Range Status   Specimen Description   Final    URINE, CLEAN CATCH Performed at [facility], [address] [place], [place] [address]    Special Requests   Final    Immunocompromised Performed at [facility], [address] [place], [place] [address]    Culture >=100,000 COLONIES/mL KLEBSIELLA PNEUMONIAE (A)  Final   Report Status 11/07/2023 FINAL  Final   Organism ID, Bacteria KLEBSIELLA PNEUMONIAE (A)  Final      Susceptibility   Klebsiella pneumoniae - MIC*    AMPICILLIN RESISTANT Resistant     CEFAZOLIN <=4 SENSITIVE Sensitive     CEFEPIME <=0.12 SENSITIVE Sensitive     CEFTRIAXONE <=0.25 SENSITIVE Sensitive     CIPROFLOXACIN <=0.25 SENSITIVE Sensitive     GENTAMICIN <=1 SENSITIVE Sensitive     IMIPENEM <=0.25 SENSITIVE Sensitive     NITROFURANTOIN <=16 SENSITIVE Sensitive     TRIMETH/SULFA <=20 SENSITIVE Sensitive     AMPICILLIN/SULBACTAM <=2 SENSITIVE Sensitive     PIP/TAZO <=4 SENSITIVE Sensitive ug/mL    * >=100,000 COLONIES/mL KLEBSIELLA PNEUMONIAE  Culture, blood (Routine X 2) w Reflex to ID Panel     Status: Abnormal   Collection Time: 11/05/23  1:15  AM   Specimen: BLOOD  Result Value Ref Range Status   Specimen Description   Final    BLOOD RIGHT ANTECUBITAL Performed at [facility], [address], [place], [place] [address]    Special Requests   Final    BOTTLES DRAWN AEROBIC AND ANAEROBIC Blood Culture results may not be optimal due to an inadequate volume of blood received in culture bottles Performed at [facility], [address], [place], [place] [address]  Culture  Setup Time   Final    GRAM NEGATIVE RODS IN BOTH AEROBIC AND ANAEROBIC BOTTLES CRITICAL RESULT CALLED TO, READ BACK BY AND VERIFIED WITH: PHARMD MARY SWAYNE ON 11/05/23 @ 1415 BY DRT Performed at [facility], [address], [place], [place] [address]    Culture KLEBSIELLA PNEUMONIAE (A)  Final   Report Status 11/07/2023 FINAL  Final   Organism ID, Bacteria KLEBSIELLA PNEUMONIAE  Final   Organism ID, Bacteria KLEBSIELLA PNEUMONIAE  Final      Susceptibility   Klebsiella pneumoniae - KIRBY BAUER*    CEFAZOLIN SENSITIVE Sensitive    Klebsiella pneumoniae - MIC*    AMPICILLIN RESISTANT Resistant     CEFEPIME <=0.12 SENSITIVE Sensitive     CEFTAZIDIME <=1 SENSITIVE Sensitive     CEFTRIAXONE <=0.25 SENSITIVE Sensitive     CIPROFLOXACIN <=0.25 SENSITIVE Sensitive     GENTAMICIN <=1 SENSITIVE Sensitive     IMIPENEM <=0.25 SENSITIVE Sensitive     TRIMETH/SULFA <=20 SENSITIVE Sensitive     AMPICILLIN/SULBACTAM <=2 SENSITIVE Sensitive     PIP/TAZO <=4 SENSITIVE Sensitive ug/mL    * KLEBSIELLA PNEUMONIAE    KLEBSIELLA PNEUMONIAE  Blood Culture ID Panel (Reflexed)     Status: Abnormal   Collection Time: 11/05/23  1:15 AM  Result Value Ref Range Status   Enterococcus faecalis NOT DETECTED NOT DETECTED Final   Enterococcus Faecium NOT DETECTED NOT DETECTED Final   Listeria monocytogenes NOT DETECTED NOT DETECTED Final   Staphylococcus species NOT DETECTED NOT DETECTED Final   Staphylococcus aureus (BCID) NOT DETECTED  NOT DETECTED Final   Staphylococcus epidermidis NOT DETECTED NOT DETECTED Final   Staphylococcus lugdunensis NOT DETECTED NOT DETECTED Final   Streptococcus species NOT DETECTED NOT DETECTED Final   Streptococcus agalactiae NOT DETECTED NOT DETECTED Final   Streptococcus pneumoniae NOT DETECTED NOT DETECTED Final   Streptococcus pyogenes NOT DETECTED NOT DETECTED Final   A.calcoaceticus-baumannii NOT DETECTED NOT DETECTED Final   Bacteroides fragilis NOT DETECTED NOT DETECTED Final   Enterobacterales DETECTED (A) NOT DETECTED Final    Comment: Enterobacterales represent a large order of gram negative bacteria, not a single organism. CRITICAL RESULT CALLED TO, READ BACK BY AND VERIFIED WITH: PHARMD MARY SWAYNE ON 11/05/23 @ 1415 BY DRT    Enterobacter cloacae complex NOT DETECTED NOT DETECTED Final   Escherichia coli NOT DETECTED NOT DETECTED Final   Klebsiella aerogenes NOT DETECTED NOT DETECTED Final   Klebsiella oxytoca NOT DETECTED NOT DETECTED Final   Klebsiella pneumoniae DETECTED (A) NOT DETECTED Final    Comment: CRITICAL RESULT CALLED TO, READ BACK BY AND VERIFIED WITH: PHARMD MARY SWAYNE ON 11/05/23 @ 1415 BY DRT    Proteus species NOT DETECTED NOT DETECTED Final   Salmonella species NOT DETECTED NOT DETECTED Final   Serratia marcescens NOT DETECTED NOT DETECTED Final   Haemophilus influenzae NOT DETECTED NOT DETECTED Final   Neisseria meningitidis NOT DETECTED NOT DETECTED Final   Pseudomonas aeruginosa NOT DETECTED NOT DETECTED Final   Stenotrophomonas maltophilia NOT DETECTED NOT DETECTED Final   Candida albicans NOT DETECTED NOT DETECTED Final   Candida auris NOT DETECTED NOT DETECTED Final   Candida glabrata NOT DETECTED NOT DETECTED Final   Candida krusei NOT DETECTED NOT DETECTED Final   Candida parapsilosis NOT DETECTED NOT DETECTED Final   Candida tropicalis NOT DETECTED NOT DETECTED Final   Cryptococcus neoformans/gattii NOT DETECTED NOT DETECTED Final   CTX-M  ESBL NOT DETECTED NOT DETECTED Final   Carbapenem  resistance IMP NOT DETECTED NOT DETECTED Final   Carbapenem resistance KPC NOT DETECTED NOT DETECTED Final   Carbapenem resistance NDM NOT DETECTED NOT DETECTED Final   Carbapenem resist OXA 48 LIKE NOT DETECTED NOT DETECTED Final   Carbapenem resistance VIM NOT DETECTED NOT DETECTED Final    Comment: Performed at [facility], [address], [place], [place] [address]  Culture, blood (Routine X 2) w Reflex to ID Panel     Status: Abnormal   Collection Time: 11/05/23  1:16 AM   Specimen: BLOOD RIGHT HAND  Result Value Ref Range Status   Specimen Description   Final    BLOOD RIGHT HAND Performed at [facility], [address], [place], [place] [address]    Special Requests   Final    BOTTLES DRAWN AEROBIC AND ANAEROBIC Blood Culture results may not be optimal due to an inadequate volume of blood received in culture bottles Performed at [facility], [address], [place], [place] [address]    Culture  Setup Time   Final    GRAM NEGATIVE RODS AEROBIC BOTTLE ONLY CRITICAL VALUE NOTED.  VALUE IS CONSISTENT WITH PREVIOUSLY REPORTED AND CALLED VALUE.    Culture (A)  Final    KLEBSIELLA PNEUMONIAE SUSCEPTIBILITIES PERFORMED ON PREVIOUS CULTURE WITHIN THE LAST 5 DAYS. Performed at [facility], [address] [place], [place] [address]    Report Status 11/07/2023 FINAL  Final  MRSA Next Gen by PCR, Nasal     Status: None   Collection Time: 11/05/23  5:31 PM   Specimen: Nasal Mucosa; Nasal Swab  Result Value Ref Range Status   MRSA by PCR Next Gen NOT DETECTED NOT DETECTED Final    Comment: (NOTE) The GeneXpert MRSA Assay (FDA approved for NASAL specimens only), is one component of a comprehensive MRSA colonization surveillance program. It is not intended to diagnose MRSA infection nor to guide or monitor treatment for MRSA infections. Test performance is not FDA approved in patients less than 19  years old. Performed at [facility], [address], [place], [place] [address]   Resp panel by RT-PCR (RSV, Flu A&B, Covid) Anterior Nasal Swab     Status: None   Collection Time: 11/05/23  6:47 PM   Specimen: Anterior Nasal Swab  Result Value Ref Range Status   SARS Coronavirus 2 by RT PCR NEGATIVE NEGATIVE Final    Comment: (NOTE) SARS-CoV-2 target nucleic acids are NOT DETECTED.  The SARS-CoV-2 RNA is generally detectable in upper respiratory specimens during the acute phase of infection. The lowest concentration of SARS-CoV-2 viral copies this assay can detect is 138 copies/mL. A negative result does not preclude SARS-Cov-2 infection and should not be used as the sole basis for treatment or other patient management decisions. A negative result may occur with  improper specimen collection/handling, submission of specimen other than nasopharyngeal swab, presence of viral mutation(s) within the areas targeted by this assay, and inadequate number of viral copies(<138 copies/mL). A negative result must be combined with clinical observations, patient history, and epidemiological information. The expected result is Negative.  Fact Sheet for Patients:  BloggerCourse.com  Fact Sheet for Healthcare Providers:  SeriousBroker.it  This test is no t yet approved or cleared by the [place] FDA and  has been authorized for detection and/or diagnosis of SARS-CoV-2 by FDA under an Emergency Use Authorization (EUA). This EUA will remain  in effect (meaning this test can be used)  for the duration of the COVID-19 declaration under Section 564(b)(1) of the Act, 21 U.S.C.section 360bbb-3(b)(1), unless the authorization is terminated  or revoked sooner.       Influenza A by PCR NEGATIVE NEGATIVE Final   Influenza B by PCR NEGATIVE NEGATIVE Final    Comment: (NOTE) The Xpert Xpress SARS-CoV-2/FLU/RSV plus assay is  intended as an aid in the diagnosis of influenza from Nasopharyngeal swab specimens and should not be used as a sole basis for treatment. Nasal washings and aspirates are unacceptable for Xpert Xpress SARS-CoV-2/FLU/RSV testing.  Fact Sheet for Patients: BloggerCourse.com  Fact Sheet for Healthcare Providers: SeriousBroker.it  This test is not yet approved or cleared by the [place] FDA and has been authorized for detection and/or diagnosis of SARS-CoV-2 by FDA under an Emergency Use Authorization (EUA). This EUA will remain in effect (meaning this test can be used) for the duration of the COVID-19 declaration under Section 564(b)(1) of the Act, 21 U.S.C. section 360bbb-3(b)(1), unless the authorization is terminated or revoked.     Resp Syncytial Virus by PCR NEGATIVE NEGATIVE Final    Comment: (NOTE) Fact Sheet for Patients: BloggerCourse.com  Fact Sheet for Healthcare Providers: SeriousBroker.it  This test is not yet approved or cleared by the [place] FDA and has been authorized for detection and/or diagnosis of SARS-CoV-2 by FDA under an Emergency Use Authorization (EUA). This EUA will remain in effect (meaning this test can be used) for the duration of the COVID-19 declaration under Section 564(b)(1) of the Act, 21 U.S.C. section 360bbb-3(b)(1), unless the authorization is terminated or revoked.  Performed at [facility], [address], [place], [place] [address]      Labs: BNP (last 3 results) No results for input(s): "BNP" in the last 8760 hours. Basic Metabolic Panel: Recent Labs  Lab 11/08/23 1603 11/09/23 0446 11/09/23 1718 11/10/23 0751 11/11/23 0400 11/12/23 0430 11/13/23 0358 11/14/23 0414  NA  --  148*  --  153* 146* 137 137 139  K  --  3.8  --  4.5 5.3* 3.9 4.4 4.4  CL  --  116*  --  121* 117* 107 109 111  CO2  --   22  --  23 22 21* 20* 22  GLUCOSE  --  117*  --  136* 107* 109* 123* 107*  BUN  --  51*  --  51* 45* 38* 40* 38*  CREATININE  --  2.55*  --  2.31* 2.16* 2.08* 2.09* 2.12*  CALCIUM  --  8.1*  --  8.4* 8.0* 7.5* 7.8* 8.2*  MG 2.1 2.3 2.5* 2.5*  --   --  2.1  --   PHOS 2.6 2.4* 2.6 2.8  --   --  3.3  --    Liver Function Tests: Recent Labs  Lab 11/10/23 0751 11/11/23 0400 11/12/23 0430 11/13/23 0358 11/14/23 0414  AST 149* 88* 60* 86* 64*  ALT 127* 107* 90* 113* 108*  ALKPHOS 352* 315* 268* 267* 238*  BILITOT 0.5 1.0 0.6 0.5 0.6  PROT 5.7* 5.3* 5.0* 5.4* 5.8*  ALBUMIN 2.5* 2.4* 2.1* 2.2* 2.2*   No results for input(s): "LIPASE", "AMYLASE" in the last 168 hours. No results for input(s): "AMMONIA" in the last 168 hours. CBC: Recent Labs  Lab 11/10/23 0751 11/11/23 0400 11/12/23 0430 11/13/23 0358 11/14/23 0414  WBC 8.4 9.7 11.1* 10.2 9.0  HGB 10.1* 10.0* 9.2* 9.0* 9.1*  HCT 34.0* 32.4* 29.2* 29.4* 29.5*  MCV 91.9 90.3 88.8  89.1 89.4  PLT 93* 114* 134* 166 193   Cardiac Enzymes: No results for input(s): "CKTOTAL", "CKMB", "CKMBINDEX", "TROPONINI" in the last 168 hours. BNP: Invalid input(s): "POCBNP" CBG: Recent Labs  Lab 11/11/23 1612 11/11/23 2108 11/11/23 2340 11/12/23 0429 11/12/23 0733  GLUCAP 72 91 75 103* 77   D-Dimer No results for input(s): "DDIMER" in the last 72 hours. Hgb A1c No results for input(s): "HGBA1C" in the last 72 hours. Lipid Profile No results for input(s): "CHOL", "HDL", "LDLCALC", "TRIG", "CHOLHDL", "LDLDIRECT" in the last 72 hours. Thyroid function studies No results for input(s): "TSH", "T4TOTAL", "T3FREE", "THYROIDAB" in the last 72 hours.  Invalid input(s): "FREET3" Anemia work up No results for input(s): "VITAMINB12", "FOLATE", "FERRITIN", "TIBC", "IRON", "RETICCTPCT" in the last 72 hours. Urinalysis    Component Value Date/Time   COLORURINE YELLOW 11/05/2023 0101   APPEARANCEUR CLOUDY (A) 11/05/2023 0101   LABSPEC 1.009  11/05/2023 0101   PHURINE 8.0 11/05/2023 0101   GLUCOSEU NEGATIVE 11/05/2023 0101   HGBUR SMALL (A) 11/05/2023 0101   BILIRUBINUR NEGATIVE 11/05/2023 0101   KETONESUR NEGATIVE 11/05/2023 0101   PROTEINUR 100 (A) 11/05/2023 0101   NITRITE NEGATIVE 11/05/2023 0101   LEUKOCYTESUR LARGE (A) 11/05/2023 0101   Sepsis Labs Recent Labs  Lab 11/11/23 0400 11/12/23 0430 11/13/23 0358 11/14/23 0414  WBC 9.7 11.1* 10.2 9.0   Microbiology Recent Results (from the past 240 hours)  Urine Culture (for pregnant, neutropenic or urologic patients or patients with an indwelling urinary catheter)     Status: Abnormal   Collection Time: 11/05/23  1:02 AM   Specimen: Urine, Clean Catch  Result Value Ref Range Status   Specimen Description   Final    URINE, CLEAN CATCH Performed at [facility], [address], [place], [place] [address]    Special Requests   Final    Immunocompromised Performed at [facility], [address] [place], [place] [address]    Culture >=100,000 COLONIES/mL KLEBSIELLA PNEUMONIAE (A)  Final   Report Status 11/07/2023 FINAL  Final   Organism ID, Bacteria KLEBSIELLA PNEUMONIAE (A)  Final      Susceptibility   Klebsiella pneumoniae - MIC*    AMPICILLIN RESISTANT Resistant     CEFAZOLIN <=4 SENSITIVE Sensitive     CEFEPIME <=0.12 SENSITIVE Sensitive     CEFTRIAXONE <=0.25 SENSITIVE Sensitive     CIPROFLOXACIN <=0.25 SENSITIVE Sensitive     GENTAMICIN <=1 SENSITIVE Sensitive     IMIPENEM <=0.25 SENSITIVE Sensitive     NITROFURANTOIN <=16 SENSITIVE Sensitive     TRIMETH/SULFA <=20 SENSITIVE Sensitive     AMPICILLIN/SULBACTAM <=2 SENSITIVE Sensitive     PIP/TAZO <=4 SENSITIVE Sensitive ug/mL    * >=100,000 COLONIES/mL KLEBSIELLA PNEUMONIAE  Culture, blood (Routine X 2) w Reflex to ID Panel     Status: Abnormal   Collection Time: 11/05/23  1:15 AM   Specimen: BLOOD  Result Value Ref Range Status   Specimen Description   Final     BLOOD RIGHT ANTECUBITAL Performed at [facility], [address], [place], [place] [address]    Special Requests   Final    BOTTLES DRAWN AEROBIC AND ANAEROBIC Blood Culture results may not be optimal due to an inadequate volume of blood received in culture bottles Performed at [facility], [address], [place], [place] [address]    Culture  Setup Time   Final    GRAM NEGATIVE RODS IN BOTH AEROBIC AND  ANAEROBIC BOTTLES CRITICAL RESULT CALLED TO, READ BACK BY AND VERIFIED WITH: PHARMD MARY SWAYNE ON 11/05/23 @ 1415 BY DRT Performed at [facility], [address], [place], [place] [address]    Culture KLEBSIELLA PNEUMONIAE (A)  Final   Report Status 11/07/2023 FINAL  Final   Organism ID, Bacteria KLEBSIELLA PNEUMONIAE  Final   Organism ID, Bacteria KLEBSIELLA PNEUMONIAE  Final      Susceptibility   Klebsiella pneumoniae - KIRBY BAUER*    CEFAZOLIN SENSITIVE Sensitive    Klebsiella pneumoniae - MIC*    AMPICILLIN RESISTANT Resistant     CEFEPIME <=0.12 SENSITIVE Sensitive     CEFTAZIDIME <=1 SENSITIVE Sensitive     CEFTRIAXONE <=0.25 SENSITIVE Sensitive     CIPROFLOXACIN <=0.25 SENSITIVE Sensitive     GENTAMICIN <=1 SENSITIVE Sensitive     IMIPENEM <=0.25 SENSITIVE Sensitive     TRIMETH/SULFA <=20 SENSITIVE Sensitive     AMPICILLIN/SULBACTAM <=2 SENSITIVE Sensitive     PIP/TAZO <=4 SENSITIVE Sensitive ug/mL    * KLEBSIELLA PNEUMONIAE    KLEBSIELLA PNEUMONIAE  Blood Culture ID Panel (Reflexed)     Status: Abnormal   Collection Time: 11/05/23  1:15 AM  Result Value Ref Range Status   Enterococcus faecalis NOT DETECTED NOT DETECTED Final   Enterococcus Faecium NOT DETECTED NOT DETECTED Final   Listeria monocytogenes NOT DETECTED NOT DETECTED Final   Staphylococcus species NOT DETECTED NOT DETECTED Final   Staphylococcus aureus (BCID) NOT DETECTED NOT DETECTED Final   Staphylococcus epidermidis NOT DETECTED NOT DETECTED Final    Staphylococcus lugdunensis NOT DETECTED NOT DETECTED Final   Streptococcus species NOT DETECTED NOT DETECTED Final   Streptococcus agalactiae NOT DETECTED NOT DETECTED Final   Streptococcus pneumoniae NOT DETECTED NOT DETECTED Final   Streptococcus pyogenes NOT DETECTED NOT DETECTED Final   A.calcoaceticus-baumannii NOT DETECTED NOT DETECTED Final   Bacteroides fragilis NOT DETECTED NOT DETECTED Final   Enterobacterales DETECTED (A) NOT DETECTED Final    Comment: Enterobacterales represent a large order of gram negative bacteria, not a single organism. CRITICAL RESULT CALLED TO, READ BACK BY AND VERIFIED WITH: PHARMD MARY SWAYNE ON 11/05/23 @ 1415 BY DRT    Enterobacter cloacae complex NOT DETECTED NOT DETECTED Final   Escherichia coli NOT DETECTED NOT DETECTED Final   Klebsiella aerogenes NOT DETECTED NOT DETECTED Final   Klebsiella oxytoca NOT DETECTED NOT DETECTED Final   Klebsiella pneumoniae DETECTED (A) NOT DETECTED Final    Comment: CRITICAL RESULT CALLED TO, READ BACK BY AND VERIFIED WITH: PHARMD MARY SWAYNE ON 11/05/23 @ 1415 BY DRT    Proteus species NOT DETECTED NOT DETECTED Final   Salmonella species NOT DETECTED NOT DETECTED Final   Serratia marcescens NOT DETECTED NOT DETECTED Final   Haemophilus influenzae NOT DETECTED NOT DETECTED Final   Neisseria meningitidis NOT DETECTED NOT DETECTED Final   Pseudomonas aeruginosa NOT DETECTED NOT DETECTED Final   Stenotrophomonas maltophilia NOT DETECTED NOT DETECTED Final   Candida albicans NOT DETECTED NOT DETECTED Final   Candida auris NOT DETECTED NOT DETECTED Final   Candida glabrata NOT DETECTED NOT DETECTED Final   Candida krusei NOT DETECTED NOT DETECTED Final   Candida parapsilosis NOT DETECTED NOT DETECTED Final   Candida tropicalis NOT DETECTED NOT DETECTED Final   Cryptococcus neoformans/gattii NOT DETECTED NOT DETECTED Final   CTX-M ESBL NOT DETECTED NOT DETECTED Final   Carbapenem resistance IMP NOT DETECTED NOT  DETECTED Final   Carbapenem resistance KPC NOT DETECTED NOT DETECTED Final  Carbapenem resistance NDM NOT DETECTED NOT DETECTED Final   Carbapenem resist OXA 48 LIKE NOT DETECTED NOT DETECTED Final   Carbapenem resistance VIM NOT DETECTED NOT DETECTED Final    Comment: Performed at [facility], [address] [place], [place] [address]  Culture, blood (Routine X 2) w Reflex to ID Panel     Status: Abnormal   Collection Time: 11/05/23  1:16 AM   Specimen: BLOOD RIGHT HAND  Result Value Ref Range Status   Specimen Description   Final    BLOOD RIGHT HAND Performed at [facility], [address] [place], [place] [address]    Special Requests   Final    BOTTLES DRAWN AEROBIC AND ANAEROBIC Blood Culture results may not be optimal due to an inadequate volume of blood received in culture bottles Performed at [facility], [address] [place], [place] [address]    Culture  Setup Time   Final    GRAM NEGATIVE RODS AEROBIC BOTTLE ONLY CRITICAL VALUE NOTED.  VALUE IS CONSISTENT WITH PREVIOUSLY REPORTED AND CALLED VALUE.    Culture (A)  Final    KLEBSIELLA PNEUMONIAE SUSCEPTIBILITIES PERFORMED ON PREVIOUS CULTURE WITHIN THE LAST 5 DAYS. Performed at [facility], [address], [place], [place] [address]    Report Status 11/07/2023 FINAL  Final  MRSA Next Gen by PCR, Nasal     Status: None   Collection Time: 11/05/23  5:31 PM   Specimen: Nasal Mucosa; Nasal Swab  Result Value Ref Range Status   MRSA by PCR Next Gen NOT DETECTED NOT DETECTED Final    Comment: (NOTE) The GeneXpert MRSA Assay (FDA approved for NASAL specimens only), is one component of a comprehensive MRSA colonization surveillance program. It is not intended to diagnose MRSA infection nor to guide or monitor treatment for MRSA infections. Test performance is not FDA approved in patients less than 27 years old. Performed at [facility], [address] [place], [place] [address]   Resp panel by RT-PCR (RSV, Flu A&B, Covid) Anterior Nasal Swab     Status: None   Collection Time: 11/05/23  6:47 PM   Specimen: Anterior Nasal Swab  Result Value Ref Range Status   SARS Coronavirus 2 by RT PCR NEGATIVE NEGATIVE Final    Comment: (NOTE) SARS-CoV-2 target nucleic acids are NOT DETECTED.  The SARS-CoV-2 RNA is generally detectable in upper respiratory specimens during the acute phase of infection. The lowest concentration of SARS-CoV-2 viral copies this assay can detect is 138 copies/mL. A negative result does not preclude SARS-Cov-2 infection and should not be used as the sole basis for treatment or other patient management decisions. A negative result may occur with  improper specimen collection/handling, submission of specimen other than nasopharyngeal swab, presence of viral mutation(s) within the areas targeted by this assay, and inadequate number of viral copies(<138 copies/mL). A negative result must be combined with clinical observations, patient history, and epidemiological information. The expected result is Negative.  Fact Sheet for Patients:  BloggerCourse.com  Fact Sheet for Healthcare Providers:  SeriousBroker.it  This test is no t yet approved or cleared by the [place] FDA and  has been authorized for detection and/or diagnosis of SARS-CoV-2 by FDA under an Emergency Use Authorization (EUA). This EUA will remain  in effect (meaning this test can be used) for the duration of the COVID-19 declaration under Section 564(b)(1) of the Act, 21 U.S.C.section 360bbb-3(b)(1), unless the authorization  is terminated  or revoked sooner.       Influenza A by PCR NEGATIVE NEGATIVE Final   Influenza B by PCR NEGATIVE NEGATIVE Final    Comment: (NOTE) The Xpert Xpress SARS-CoV-2/FLU/RSV plus assay is intended as an aid in the diagnosis of influenza from Nasopharyngeal swab  specimens and should not be used as a sole basis for treatment. Nasal washings and aspirates are unacceptable for Xpert Xpress SARS-CoV-2/FLU/RSV testing.  Fact Sheet for Patients: BloggerCourse.com  Fact Sheet for Healthcare Providers: SeriousBroker.it  This test is not yet approved or cleared by the [place] FDA and has been authorized for detection and/or diagnosis of SARS-CoV-2 by FDA under an Emergency Use Authorization (EUA). This EUA will remain in effect (meaning this test can be used) for the duration of the COVID-19 declaration under Section 564(b)(1) of the Act, 21 U.S.C. section 360bbb-3(b)(1), unless the authorization is terminated or revoked.     Resp Syncytial Virus by PCR NEGATIVE NEGATIVE Final    Comment: (NOTE) Fact Sheet for Patients: BloggerCourse.com  Fact Sheet for Healthcare Providers: SeriousBroker.it  This test is not yet approved or cleared by the [place] FDA and has been authorized for detection and/or diagnosis of SARS-CoV-2 by FDA under an Emergency Use Authorization (EUA). This EUA will remain in effect (meaning this test can be used) for the duration of the COVID-19 declaration under Section 564(b)(1) of the Act, 21 U.S.C. section 360bbb-3(b)(1), unless the authorization is terminated or revoked.  Performed at [facility], [address], [place], [place] [address]      Time coordinating discharge: Over 30 minutes  SIGNED:   Alvira Philips [place], DO  [facility] 11/14/2023, 11:00 AM

## 2023-11-14 NOTE — Progress Notes (Signed)
 PROGRESS NOTE    DONATELLO KLEVE  WUJ:[id number] DOB: May 17, 1941 DOA: 11/04/2023 PCP: Thana Ates, MD    Brief Narrative:   HARLON KUTNER is a 83 y.o. male with past medical history significant for SBO (s/p ex-lap/SBR 02/2022), aggressive bladder/prostate CA requiring ileal conduit (followed by Urology - Dr. 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  In the ED, Tmax 103.47F, tachycardic to 100s, tachypneic to 20s-low 30s, BP 150s/80s. Labs were notable for WBC 8.3, Hgb 14.1, Plt 146. Na 131, K 4.3, CO2 21, Cr 2.71 (baseline variable), LFTs WNL. Trop unremarkable. UA with small Hgb, 100 protein, large leuks, rare bacteria. Blood Cx and Urine Cx pending.  CT abdomen/pelvis with right lower quadrant ileal conduit that is decompressed with bilateral hydronephrosis/hydroureter, left renal atrophy, concern for small bowel obstruction with possible vascular compromise causing small bowel wall thickening appears to be localized in the right lower quadrant with concern of adhesions.  Empiric broad-spectrum antibiotics (cefepime/Flagyl) started.  EDP discussed with urology and general surgery will see in consultation.  Hospitalist service consulted for admission.  Significant Hospital events: 2/24: Admit to [facility], [facility], urology consulted; started on broad-spectrum antibiotics 2/25: Persistently febrile with soft BP, rising LA to 7.1. PCCM consulted. Taken to OR for ex-lap; found to have closed-loop SBO with necrosis (resected) 2/2 tight band (nonfunctional L ureter) which was ligated. Taken to ICU postoperatively on 2 vasopressors and intubated. LIJ CVC placed for access. 2/26: Sedation weaned, extubated. 2/27: POD#2 from ex-lap/LOA and L ureteral  ligation. Remains encephalopathic and uncomfortable. Bicarb gtt discontinued. Off of Levophed. Urostomy with increased mucous/purulent material/ Plt 49, held Lovenox 2/28: Encephalopathy improving, right IJ catheter removed, creatinine improved to 2.6.  Transferred to progressive unit 3/1: Transferred to [facility]; tube feeds increased to 30 mL/h; stable; downgrade to med/surg 3/2: per CCS, ok to increase tube feeds to goal, SLP eval for swallow study 3/3: NGT/tube feeds discontinued yesterday, started on diet, remains with poor oral intake, hypoglycemic started on D5W gtt 3/4: JP drain removed and diet further advanced by general surgery, will discontinue D5 drip given increased oral intake 3/6: To complete IV antibiotics today, awaiting equipment to be arranged at [facility]; medically stable for discharge once equipment arrives   Assessment & Plan:   Small bowel obstruction with necrosis s/p Exlap, LOA, SBR Patient presenting to the ED with acute onset abdominal pain.  Temperature 103.5, tachycardic, tachypneic.  CT abdomen/pelvis notable for SBO with possible vascular compromise.  General surgery was consulted and patient underwent exploratory laparotomy with lysis of adhesions, small bowel resection, primary hernia repair on 11/05/2023 by Dr. Carolynne Edouard.  -- General Surgery following, appreciate assistance -- Oxycodone 5-10 mg PO q4h PRN moderate pain -- Lidocaine patch -- Fentanyl 25-50 mg IV q2h PRN severe pain -- Continue wound VAC -- Further per general surgery  Septic shock Klebsiella pneumonia UTI/bacteremia Urine culture and blood cultures positive for Klebsiella pneumonia with resistance to ampicillin.  Completed 10-day course of IV ceftriaxone on 11/14/2023.  Acute metabolic encephalopathy: Resolved Etiology likely secondary to critical illness, sepsis secondary to bowel obstruction with necrosis as above.  Postoperatively required vasopressor support, mechanical ventilation; which now  has been discontinued.  -- Supportive care  Acute renal failure on CKD stage IV History of bladder/prostate  cancer s/p ileal conduit History of recurrent pyelonephritis s/p cystectomy Chronic left hydronephrotic kidney Patient follows with urology outpatient, Dr. Berneice Heinrich.  Does not follow with nephrology outpatient.  Underwent cystoprostatectomy with conduit diversion initially  January 2023.  Patient underwent left ureter ligation and repair of ileal conduit by Dr. Alvester Morin on 11/05/2023.  Initially seen by nephrology now signed off with downtrend of creatinine. -- Cr 2.71>>4.85>>2.63>2.55>2.31>2.16>2.09>2.12 (baseline 2.5) -- BMP daily -- Outpatient follow-up with urology, Dr. Berneice Heinrich  Hypoglycemia: Resolved On transition to oral diet from tube feeds, patient developed hypoglycemia.  Supported with increased oral intake and now resolved.    Transaminitis -- AST 21>>81>149>88>60>86>64 -- ALT 19>45>127>107>90>113>108 -- Total bilirubin 0.8>>0.5>0.5>1.0>0.6>0.5>0.6 -- Discontinued Tylenol; holding home statin -- Repeat CMP in a.m.  Hypernatremia: Resolved Sodium down to 139, D5W drip discontinued. -- Repeat CMP in a.m.  Hypokalemia Repleted -- Repeat electrolytes in a.m.  Hypophosphatemia Repleted -- Repeat electrolytes in a.m.  Hyperlipidemia -- Hold home atorvastatin  Weakness/debility/deconditioning: -- PT/OT recommend SNF placement, TOC consulted; pending insurance authorization for [facility]  DVT prophylaxis: enoxaparin (LOVENOX) injection 30 mg Start: 11/12/23 1200 SCDs Start: 11/05/23 1125    Code Status: Full Code Family Communication: No family present at bedside this morning; the patient's son Judie Grieve via telephone this morning  Disposition Plan:  Level of care: Med-Surg Status is: Inpatient Remains inpatient appropriate because: Medically stable for [facility] to [facility] once equipment arrives    Consultants:  Urology General surgery Nephrology PCCM  -signed off 11/09/2023  Procedures:  Exploratory laparotomy, LOA, SBR, ligation left ureter, primary hernia repair; Dr. Carolynne Edouard, Dr. Fredricka Bonine, Dr. Alvester Morin, Dr. Berneice Heinrich; 11/05/2023  Antimicrobials:  Cefepime 2/24 - 2/25 Metronidazole 2/25 - 2/26 Ceftriaxone 2/26>>   Subjective: Patient seen examined bedside, lying in bed.  Eating breakfast.  Overall feels improve other than generalized weakness.  Pain controlled.  Remains on ceftriaxone for 10-day IV antibiotic course, anticipate finishing tomorrow.   Denies headache, no chest pain, no palpitations, no shortness of breath, no fever/chills, no nausea/vomiting, no diarrhea.  No acute events overnight per nursing staff.  Pending insurance authorization for [facility].   Objective: Vitals:   11/13/23 1443 11/13/23 1940 11/14/23 0453 11/14/23 0500  BP: 118/62 108/67 (!) 114/59   Pulse: (!) 101 81 80   Resp: 20 18 19    Temp: 98.2 F (36.8 C) 98.2 F (36.8 C) 98.4 F (36.9 C)   TempSrc: Oral Oral Oral   SpO2: 98% 97% 100%   Weight:    85.7 kg  Height:        Intake/Output Summary (Last 24 hours) at 11/14/2023 1031 Last data filed at 11/14/2023 0900 Gross per 24 hour  Intake 480 ml  Output 1350 ml  Net -870 ml   Filed Weights   11/12/23 0437 11/13/23 0500 11/14/23 0500  Weight: 86.6 kg 85.7 kg 85.7 kg    Examination:  Physical Exam: GEN: NAD, alert and oriented x 3, ill in appearance HEENT: NCAT, PERRL, EOMI, sclera clear, MMM PULM: CTAB w/o wheezes/crackles, normal respiratory effort, on room air with SpO2 99% CV: RRR w/o M/G/R GI: abd soft, wound VAC noted w/ slight tenderness surrounding, nondistended,  + BS GU: Urostomy noted with yellow urine in collection bag MSK: no peripheral edema, moves all extremities independently NEURO: No focal neurological deficits PSYCH: normal mood/affect Integumentary: Urostomy and wound VAC site noted, otherwise no other concerning rashes/lesions/wounds noted on exposed skin surfaces    Data  Reviewed: I have personally reviewed  following labs and imaging studies  CBC: Recent Labs  Lab 11/10/23 0751 11/11/23 0400 11/12/23 0430 11/13/23 0358 11/14/23 0414  WBC 8.4 9.7 11.1* 10.2 9.0  HGB 10.1* 10.0* 9.2* 9.0* 9.1*  HCT 34.0* 32.4* 29.2* 29.4* 29.5*  MCV 91.9 90.3 88.8 89.1 89.4  PLT 93* 114* 134* 166 193   Basic Metabolic Panel: Recent Labs  Lab 11/08/23 1603 11/09/23 0446 11/09/23 1718 11/10/23 0751 11/11/23 0400 11/12/23 0430 11/13/23 0358 11/14/23 0414  NA  --  148*  --  153* 146* 137 137 139  K  --  3.8  --  4.5 5.3* 3.9 4.4 4.4  CL  --  116*  --  121* 117* 107 109 111  CO2  --  22  --  23 22 21* 20* 22  GLUCOSE  --  117*  --  136* 107* 109* 123* 107*  BUN  --  51*  --  51* 45* 38* 40* 38*  CREATININE  --  2.55*  --  2.31* 2.16* 2.08* 2.09* 2.12*  CALCIUM  --  8.1*  --  8.4* 8.0* 7.5* 7.8* 8.2*  MG 2.1 2.3 2.5* 2.5*  --   --  2.1  --   PHOS 2.6 2.4* 2.6 2.8  --   --  3.3  --    GFR: Estimated Creatinine Clearance: 29.6 mL/min (A) (by C-G formula based on SCr of 2.12 mg/dL (H)). Liver Function Tests: Recent Labs  Lab 11/10/23 0751 11/11/23 0400 11/12/23 0430 11/13/23 0358 11/14/23 0414  AST 149* 88* 60* 86* 64*  ALT 127* 107* 90* 113* 108*  ALKPHOS 352* 315* 268* 267* 238*  BILITOT 0.5 1.0 0.6 0.5 0.6  PROT 5.7* 5.3* 5.0* 5.4* 5.8*  ALBUMIN 2.5* 2.4* 2.1* 2.2* 2.2*   No results for input(s): "LIPASE", "AMYLASE" in the last 168 hours. No results for input(s): "AMMONIA" in the last 168 hours. Coagulation Profile: Recent Labs  Lab 11/09/23 0446  INR 1.1   Cardiac Enzymes: No results for input(s): "CKTOTAL", "CKMB", "CKMBINDEX", "TROPONINI" in the last 168 hours. BNP (last 3 results) No results for input(s): "PROBNP" in the last 8760 hours. HbA1C: No results for input(s): "HGBA1C" in the last 72 hours. CBG: Recent Labs  Lab 11/11/23 1612 11/11/23 2108 11/11/23 2340 11/12/23 0429 11/12/23 0733  GLUCAP 72 91 75 103* 77   Lipid  Profile: No results for input(s): "CHOL", "HDL", "LDLCALC", "TRIG", "CHOLHDL", "LDLDIRECT" in the last 72 hours. Thyroid Function Tests: No results for input(s): "TSH", "T4TOTAL", "FREET4", "T3FREE", "THYROIDAB" in the last 72 hours. Anemia Panel: No results for input(s): "VITAMINB12", "FOLATE", "FERRITIN", "TIBC", "IRON", "RETICCTPCT" in the last 72 hours. Sepsis Labs: No results for input(s): "PROCALCITON", "LATICACIDVEN" in the last 168 hours.   Recent Results (from the past 240 hours)  Urine Culture (for pregnant, neutropenic or urologic patients or patients with an indwelling urinary catheter)     Status: Abnormal   Collection Time: 11/05/23  1:02 AM   Specimen: Urine, Clean Catch  Result Value Ref Range Status   Specimen Description   Final    URINE, CLEAN CATCH Performed at [facility], [address], [place], [place] [address]    Special Requests   Final    Immunocompromised Performed at [facility], [address], [place], [place] [address]    Culture >=100,000 COLONIES/mL KLEBSIELLA PNEUMONIAE (A)  Final   Report Status 11/07/2023 FINAL  Final   Organism ID, Bacteria KLEBSIELLA PNEUMONIAE (A)  Final  Susceptibility   Klebsiella pneumoniae - MIC*    AMPICILLIN RESISTANT Resistant     CEFAZOLIN <=4 SENSITIVE Sensitive     CEFEPIME <=0.12 SENSITIVE Sensitive     CEFTRIAXONE <=0.25 SENSITIVE Sensitive     CIPROFLOXACIN <=0.25 SENSITIVE Sensitive     GENTAMICIN <=1 SENSITIVE Sensitive     IMIPENEM <=0.25 SENSITIVE Sensitive     NITROFURANTOIN <=16 SENSITIVE Sensitive     TRIMETH/SULFA <=20 SENSITIVE Sensitive     AMPICILLIN/SULBACTAM <=2 SENSITIVE Sensitive     PIP/TAZO <=4 SENSITIVE Sensitive ug/mL    * >=100,000 COLONIES/mL KLEBSIELLA PNEUMONIAE  Culture, blood (Routine X 2) w Reflex to ID Panel     Status: Abnormal   Collection Time: 11/05/23  1:15 AM   Specimen: BLOOD  Result Value Ref Range Status   Specimen  Description   Final    BLOOD RIGHT ANTECUBITAL Performed at [facility], [address], [place], [place] [address]    Special Requests   Final    BOTTLES DRAWN AEROBIC AND ANAEROBIC Blood Culture results may not be optimal due to an inadequate volume of blood received in culture bottles Performed at [facility], [address] [place], [place] [address]    Culture  Setup Time   Final    GRAM NEGATIVE RODS IN BOTH AEROBIC AND ANAEROBIC BOTTLES CRITICAL RESULT CALLED TO, READ BACK BY AND VERIFIED WITH: PHARMD MARY SWAYNE ON 11/05/23 @ 1415 BY DRT Performed at [facility], [address] [place], [place] [address]    Culture KLEBSIELLA PNEUMONIAE (A)  Final   Report Status 11/07/2023 FINAL  Final   Organism ID, Bacteria KLEBSIELLA PNEUMONIAE  Final   Organism ID, Bacteria KLEBSIELLA PNEUMONIAE  Final      Susceptibility   Klebsiella pneumoniae - KIRBY BAUER*    CEFAZOLIN SENSITIVE Sensitive    Klebsiella pneumoniae - MIC*    AMPICILLIN RESISTANT Resistant     CEFEPIME <=0.12 SENSITIVE Sensitive     CEFTAZIDIME <=1 SENSITIVE Sensitive     CEFTRIAXONE <=0.25 SENSITIVE Sensitive     CIPROFLOXACIN <=0.25 SENSITIVE Sensitive     GENTAMICIN <=1 SENSITIVE Sensitive     IMIPENEM <=0.25 SENSITIVE Sensitive     TRIMETH/SULFA <=20 SENSITIVE Sensitive     AMPICILLIN/SULBACTAM <=2 SENSITIVE Sensitive     PIP/TAZO <=4 SENSITIVE Sensitive ug/mL    * KLEBSIELLA PNEUMONIAE    KLEBSIELLA PNEUMONIAE  Blood Culture ID Panel (Reflexed)     Status: Abnormal   Collection Time: 11/05/23  1:15 AM  Result Value Ref Range Status   Enterococcus faecalis NOT DETECTED NOT DETECTED Final   Enterococcus Faecium NOT DETECTED NOT DETECTED Final   Listeria monocytogenes NOT DETECTED NOT DETECTED Final   Staphylococcus species NOT DETECTED NOT DETECTED Final   Staphylococcus aureus (BCID) NOT DETECTED NOT DETECTED Final   Staphylococcus epidermidis NOT DETECTED NOT  DETECTED Final   Staphylococcus lugdunensis NOT DETECTED NOT DETECTED Final   Streptococcus species NOT DETECTED NOT DETECTED Final   Streptococcus agalactiae NOT DETECTED NOT DETECTED Final   Streptococcus pneumoniae NOT DETECTED NOT DETECTED Final   Streptococcus pyogenes NOT DETECTED NOT DETECTED Final   A.calcoaceticus-baumannii NOT DETECTED NOT DETECTED Final   Bacteroides fragilis NOT DETECTED NOT DETECTED Final   Enterobacterales DETECTED (A) NOT DETECTED Final    Comment: Enterobacterales represent a large order of gram negative bacteria, not a single organism. CRITICAL RESULT CALLED TO, READ BACK BY AND VERIFIED WITH: PHARMD MARY SWAYNE ON 11/05/23 @  1415 BY DRT    Enterobacter cloacae complex NOT DETECTED NOT DETECTED Final   Escherichia coli NOT DETECTED NOT DETECTED Final   Klebsiella aerogenes NOT DETECTED NOT DETECTED Final   Klebsiella oxytoca NOT DETECTED NOT DETECTED Final   Klebsiella pneumoniae DETECTED (A) NOT DETECTED Final    Comment: CRITICAL RESULT CALLED TO, READ BACK BY AND VERIFIED WITH: PHARMD MARY SWAYNE ON 11/05/23 @ 1415 BY DRT    Proteus species NOT DETECTED NOT DETECTED Final   Salmonella species NOT DETECTED NOT DETECTED Final   Serratia marcescens NOT DETECTED NOT DETECTED Final   Haemophilus influenzae NOT DETECTED NOT DETECTED Final   Neisseria meningitidis NOT DETECTED NOT DETECTED Final   Pseudomonas aeruginosa NOT DETECTED NOT DETECTED Final   Stenotrophomonas maltophilia NOT DETECTED NOT DETECTED Final   Candida albicans NOT DETECTED NOT DETECTED Final   Candida auris NOT DETECTED NOT DETECTED Final   Candida glabrata NOT DETECTED NOT DETECTED Final   Candida krusei NOT DETECTED NOT DETECTED Final   Candida parapsilosis NOT DETECTED NOT DETECTED Final   Candida tropicalis NOT DETECTED NOT DETECTED Final   Cryptococcus neoformans/gattii NOT DETECTED NOT DETECTED Final   CTX-M ESBL NOT DETECTED NOT DETECTED Final   Carbapenem resistance IMP NOT  DETECTED NOT DETECTED Final   Carbapenem resistance KPC NOT DETECTED NOT DETECTED Final   Carbapenem resistance NDM NOT DETECTED NOT DETECTED Final   Carbapenem resist OXA 48 LIKE NOT DETECTED NOT DETECTED Final   Carbapenem resistance VIM NOT DETECTED NOT DETECTED Final    Comment: Performed at [facility], [address] [place], [place] [address]  Culture, blood (Routine X 2) w Reflex to ID Panel     Status: Abnormal   Collection Time: 11/05/23  1:16 AM   Specimen: BLOOD RIGHT HAND  Result Value Ref Range Status   Specimen Description   Final    BLOOD RIGHT HAND Performed at [facility], [address], [place], [place] [address]    Special Requests   Final    BOTTLES DRAWN AEROBIC AND ANAEROBIC Blood Culture results may not be optimal due to an inadequate volume of blood received in culture bottles Performed at [facility], [address], [place], [place] [address]    Culture  Setup Time   Final    GRAM NEGATIVE RODS AEROBIC BOTTLE ONLY CRITICAL VALUE NOTED.  VALUE IS CONSISTENT WITH PREVIOUSLY REPORTED AND CALLED VALUE.    Culture (A)  Final    KLEBSIELLA PNEUMONIAE SUSCEPTIBILITIES PERFORMED ON PREVIOUS CULTURE WITHIN THE LAST 5 DAYS. Performed at [facility], [address] [place], [place] [address]    Report Status 11/07/2023 FINAL  Final  MRSA Next Gen by PCR, Nasal     Status: None   Collection Time: 11/05/23  5:31 PM   Specimen: Nasal Mucosa; Nasal Swab  Result Value Ref Range Status   MRSA by PCR Next Gen NOT DETECTED NOT DETECTED Final    Comment: (NOTE) The GeneXpert MRSA Assay (FDA approved for NASAL specimens only), is one component of a comprehensive MRSA colonization surveillance program. It is not intended to diagnose MRSA infection nor to guide or monitor treatment for MRSA infections. Test performance is not FDA approved in patients less than 7 years old. Performed at [facility], [address], [place], [place] [address]   Resp panel by RT-PCR (RSV, Flu A&B, Covid) Anterior Nasal Swab     Status: None  Collection Time: 11/05/23  6:47 PM   Specimen: Anterior Nasal Swab  Result Value Ref Range Status   SARS Coronavirus 2 by RT PCR NEGATIVE NEGATIVE Final    Comment: (NOTE) SARS-CoV-2 target nucleic acids are NOT DETECTED.  The SARS-CoV-2 RNA is generally detectable in upper respiratory specimens during the acute phase of infection. The lowest concentration of SARS-CoV-2 viral copies this assay can detect is 138 copies/mL. A negative result does not preclude SARS-Cov-2 infection and should not be used as the sole basis for treatment or other patient management decisions. A negative result may occur with  improper specimen collection/handling, submission of specimen other than nasopharyngeal swab, presence of viral mutation(s) within the areas targeted by this assay, and inadequate number of viral copies(<138 copies/mL). A negative result must be combined with clinical observations, patient history, and epidemiological information. The expected result is Negative.  Fact Sheet for Patients:  BloggerCourse.com  Fact Sheet for Healthcare Providers:  SeriousBroker.it  This test is no t yet approved or cleared by the [place] FDA and  has been authorized for detection and/or diagnosis of SARS-CoV-2 by FDA under an Emergency Use Authorization (EUA). This EUA will remain  in effect (meaning this test can be used) for the duration of the COVID-19 declaration under Section 564(b)(1) of the Act, 21 U.S.C.section 360bbb-3(b)(1), unless the authorization is terminated  or revoked sooner.       Influenza A by PCR NEGATIVE NEGATIVE Final   Influenza B by PCR NEGATIVE NEGATIVE Final    Comment: (NOTE) The Xpert Xpress SARS-CoV-2/FLU/RSV plus assay is intended as an aid in the diagnosis of influenza from Nasopharyngeal  swab specimens and should not be used as a sole basis for treatment. Nasal washings and aspirates are unacceptable for Xpert Xpress SARS-CoV-2/FLU/RSV testing.  Fact Sheet for Patients: BloggerCourse.com  Fact Sheet for Healthcare Providers: SeriousBroker.it  This test is not yet approved or cleared by the [place] FDA and has been authorized for detection and/or diagnosis of SARS-CoV-2 by FDA under an Emergency Use Authorization (EUA). This EUA will remain in effect (meaning this test can be used) for the duration of the COVID-19 declaration under Section 564(b)(1) of the Act, 21 U.S.C. section 360bbb-3(b)(1), unless the authorization is terminated or revoked.     Resp Syncytial Virus by PCR NEGATIVE NEGATIVE Final    Comment: (NOTE) Fact Sheet for Patients: BloggerCourse.com  Fact Sheet for Healthcare Providers: SeriousBroker.it  This test is not yet approved or cleared by the [place] FDA and has been authorized for detection and/or diagnosis of SARS-CoV-2 by FDA under an Emergency Use Authorization (EUA). This EUA will remain in effect (meaning this test can be used) for the duration of the COVID-19 declaration under Section 564(b)(1) of the Act, 21 U.S.C. section 360bbb-3(b)(1), unless the authorization is terminated or revoked.  Performed at [facility], [address] [place], [place] [address]          Radiology Studies: No results found.       Scheduled Meds:  Chlorhexidine Gluconate Cloth  6 each Topical Q2200   enoxaparin (LOVENOX) injection  30 mg Subcutaneous Q24H   feeding supplement  237 mL Oral BID BM   lidocaine  1 patch Transdermal Q24H   pantoprazole  40 mg Oral Daily   Continuous Infusions:     LOS: 10 days    Time spent: 48 minutes spent on chart review, discussion with nursing staff, consultants,  updating family and interview/physical exam; more  than 50% of that time was spent in counseling and/or coordination of care.    Alvira Philips [place], DO [facility] Available via Epic secure chat 7am-7pm After these hours, please refer to coverage provider listed on [email or web address] 11/14/2023, 10:31 AM

## 2023-11-14 NOTE — Progress Notes (Addendum)
 [facility] Progress Note  9 Days Post-Op  Subjective: CC:  NAEO. Pain controlled. Tolerating PO. Having Bms.   Objective: Vital signs in last 24 hours: Temp:  [98.2 F (36.8 C)-98.4 F (36.9 C)] 98.4 F (36.9 C) (03/06 0453) Pulse Rate:  [80-101] 80 (03/06 0453) Resp:  [18-20] 19 (03/06 0453) BP: (108-118)/(59-67) 114/59 (03/06 0453) SpO2:  [97 %-100 %] 100 % (03/06 0453) Weight:  [85.7 kg] 85.7 kg (03/06 0500) Last BM Date : 11/13/23  Intake/Output from previous day: 03/05 0701 - 03/06 0700 In: 240 [P.O.:240] Out: 1350 [Urine:1350] Intake/Output this shift: Total I/O In: 240 [P.O.:240] Out: -   PE: Gen:  Alert, NAD, pleasant Card:  Regular rate and rhythm, pedal pulses 2+ BL Pulm:  Normal effort, clear to auscultation bilaterally Abd: Soft, approp tender., midline incision healing nicely as below - fascia in tact. Granulation tissue. No purulence. No cellulitis. Urostomy functioning - clear yellow urine in bag.  Skin: warm and dry, no rashes  Psych: A&Ox3   Lab Results:  Recent Labs    11/13/23 0358 11/14/23 0414  WBC 10.2 9.0  HGB 9.0* 9.1*  HCT 29.4* 29.5*  PLT 166 193   BMET Recent Labs    11/13/23 0358 11/14/23 0414  NA 137 139  K 4.4 4.4  CL 109 111  CO2 20* 22  GLUCOSE 123* 107*  BUN 40* 38*  CREATININE 2.09* 2.12*  CALCIUM 7.8* 8.2*   PT/INR No results for input(s): "LABPROT", "INR" in the last 72 hours. CMP     Component Value Date/Time   NA 139 11/14/2023 0414   K 4.4 11/14/2023 0414   CL 111 11/14/2023 0414   CO2 22 11/14/2023 0414   GLUCOSE 107 (H) 11/14/2023 0414   BUN 38 (H) 11/14/2023 0414   CREATININE 2.12 (H) 11/14/2023 0414   CALCIUM 8.2 (L) 11/14/2023 0414   PROT 5.8 (L) 11/14/2023 0414   ALBUMIN 2.2 (L) 11/14/2023 0414   AST 64 (H) 11/14/2023 0414   ALT 108 (H) 11/14/2023 0414   ALKPHOS 238 (H) 11/14/2023 0414   BILITOT 0.6 11/14/2023 0414   GFRNONAA 31 (L) 11/14/2023 0414   Lipase     Component  Value Date/Time   LIPASE 38 11/14/2021 1355       Studies/Results: No results found.  Anti-infectives: Anti-infectives (From admission, onward)    Start     Dose/Rate Route Frequency Ordered Stop   11/07/23 0500  cefTRIAXone (ROCEPHIN) 2 g in sodium chloride 0.9 % 100 mL IVPB        2 g 200 mL/hr over 30 Minutes Intravenous Every 24 hours 11/06/23 1046 11/14/23 0550   11/06/23 0400  ceFEPIme (MAXIPIME) 1 g in sodium chloride 0.9 % 100 mL IVPB  Status:  Discontinued        1 g 200 mL/hr over 30 Minutes Intravenous Every 24 hours 11/05/23 0456 11/05/23 1419   11/06/23 0400  ceFEPIme (MAXIPIME) 2 g in sodium chloride 0.9 % 100 mL IVPB  Status:  Discontinued        2 g 200 mL/hr over 30 Minutes Intravenous Every 24 hours 11/05/23 1419 11/06/23 1046   11/05/23 1200  metroNIDAZOLE (FLAGYL) IVPB 500 mg  Status:  Discontinued        500 mg 100 mL/hr over 60 Minutes Intravenous Every 12 hours 11/05/23 1106 11/06/23 1046   11/05/23 0345  ceFEPIme (MAXIPIME) 2 g in sodium chloride 0.9 % 100 mL IVPB  2 g 200 mL/hr over 30 Minutes Intravenous  Once 11/05/23 0331 11/05/23 0424        Assessment/Plan  /P EXPLORATORY LAPAROTOMY; lysis of adhesions; small bowel resection; ligation of left ureter; primary hernia repair 2/25 Dr. Carolynne Edouard (with Dr. Alvester Morin, Dr. Berneice Heinrich)  Stable for discharge to SNF. Continue VAC.  Follow up provided.     LOS: 10 days   I reviewed nursing notes, hospitalist notes, last 24 h vitals and pain scores, last 48 h intake and output, last 24 h labs and trends, and last 24 h imaging results.  This care required straight-forward level of medical decision making.   Hosie Spangle, PA-C [facility] Please see Amion for pager number during day hours 7:00am-4:30pm

## 2023-11-15 ENCOUNTER — Non-Acute Institutional Stay (SKILLED_NURSING_FACILITY): Payer: Self-pay | Admitting: Sports Medicine

## 2023-11-15 ENCOUNTER — Encounter: Payer: Self-pay | Admitting: Sports Medicine

## 2023-11-15 DIAGNOSIS — E785 Hyperlipidemia, unspecified: Secondary | ICD-10-CM | POA: Diagnosis not present

## 2023-11-15 DIAGNOSIS — R531 Weakness: Secondary | ICD-10-CM

## 2023-11-15 DIAGNOSIS — K56609 Unspecified intestinal obstruction, unspecified as to partial versus complete obstruction: Secondary | ICD-10-CM | POA: Diagnosis not present

## 2023-11-15 DIAGNOSIS — R7401 Elevation of levels of liver transaminase levels: Secondary | ICD-10-CM

## 2023-11-15 DIAGNOSIS — C672 Malignant neoplasm of lateral wall of bladder: Secondary | ICD-10-CM | POA: Diagnosis not present

## 2023-11-15 NOTE — Progress Notes (Signed)
 Provider:  Dr. Venita Sheffield Location:  [facility] Place of Service:   Skilled care   PCP: Thana Ates, MD Patient Care Team: Thana Ates, MD as PCP - General (Internal Medicine)  Extended Emergency Contact Information Primary Emergency Contact: [facility] Address: [address]          [place], [place] [address] D[place] of [place] Phone: [phone] Relation: Spouse Secondary Emergency Contact: [facility] Home Phone: [phone] Mobile Phone: [phone] Relation: Son  Goals of Care: Advanced Directive information    11/08/2023    8:00 AM  Advanced Directives  Does Patient Have a Medical Advance Directive? Unable to assess, patient is non-responsive or altered mental status      No chief complaint on file.   HPI: Patient is a 83 y.o. [place] seen today for admission to [facility].           Patient seen and examined in his room.  He is laying on the bed.  Both his hands available during the visit today.  Patient states he is doing fine. He worked with physical therapy this morning able to transfer and walk to the bathroom with assistance.  Denies abdominal pain, nausea, vomiting. Able to tolerate p.o. food with no vomitings. Patient denies fevers, chills, chest pain, shortness of breath, dizzy, lightheadedness.  As per d/c summary   Hospital course:   ''  Small bowel obstruction with necrosis s/p Exlap, LOA, SBR Patient presenting to the ED with acute onset abdominal pain.  Temperature 103.5, tachycardic, tachypneic.  CT abdomen/pelvis notable for SBO with possible vascular compromise.  General surgery was consulted and patient underwent exploratory laparotomy with lysis of adhesions, small bowel resection, primary hernia repair on 11/05/2023 by Dr. Carolynne Edouard.  Patient initially required tube feeds which was slowly titrated off with up titration of oral intake in which she was tolerating well at time of discharge.  Continue  wound VAC with twice weekly changes on Monday and Thursdays, last changed on 11/14/2023.  Continue oxycodone 5 mg p.o. every 8 hours as needed moderate pain.  Avoid Tylenol and NSAIDs given his elevated LFTs and renal dysfunction.  Outpatient follow-up with urology and general surgery.''    Past Medical History:  Diagnosis Date   Bladder cancer (HCC)    Chronic kidney disease    Dementia (HCC)    History of kidney stones    Medical history non-contributory    Meningitis    hx of as a child affected right eye   Past Surgical History:  Procedure Laterality Date   ciliodional cyst  yrs ago   COLONOSCOPY WITH PROPOFOL N/A 02/26/2017   Procedure: COLONOSCOPY WITH PROPOFOL;  Surgeon: Charolett Bumpers, MD;  Location: [facility];  Service: Endoscopy;  Laterality: N/A;   colonscopy  2008   HERNIA REPAIR     LAPAROTOMY N/A 11/05/2023   Procedure: EXPLORATORY LAPAROTOMY; lysis of adhesions; small bowel resection; ligation of left ureter; primary hernia repair;  Surgeon: Griselda Miner, MD;  Location: [facility];  Service: General;  Laterality: N/A;   LYMPH NODE DISSECTION Bilateral 09/22/2021   Procedure: LYMPH NODE DISSECTION;  Surgeon: Sebastian Ache, MD;  Location: [facility];  Service: Urology;  Laterality: Bilateral;   NEPHROLITHOTOMY Right 03/30/2022   Procedure: NEPHROLITHOTOMY PERCUTANEOUS;  Surgeon: Sebastian Ache, MD;  Location: [facility];  Service: Urology;  Laterality: Right;  2 HRS   NEPHROLITHOTOMY Right 03/31/2022   Procedure: RIGHT ANTEGRADE NEPHROGRAM; NEPHROSTOMY TUBE PLACEMENT;  Surgeon: Sebastian Ache,  MD;  Location: [facility];  Service: Urology;  Laterality: Right;   ROBOT ASSISTED LAPAROSCOPIC COMPLETE CYSTECT ILEAL CONDUIT N/A 09/22/2021   Procedure: XI ROBOTIC ASSISTED LAPAROSCOPIC COMPLETE CYSTECT ILEAL CONDUIT WITH INDOCYANINE GREEN DYE;  Surgeon: Sebastian Ache, MD;  Location: [facility];  Service: Urology;  Laterality: N/A;  6 HRS   ROBOT ASSISTED LAPAROSCOPIC RADICAL PROSTATECTOMY N/A  09/22/2021   Procedure: XI ROBOTIC ASSISTED LAPAROSCOPIC RADICAL PROSTATECTOMY;  Surgeon: Sebastian Ache, MD;  Location: [facility];  Service: Urology;  Laterality: N/A;   TONSILLECTOMY  as child   TRANSURETHRAL RESECTION OF BLADDER TUMOR WITH MITOMYCIN-C N/A 05/02/2021   Procedure: TRANSURETHRAL RESECTION OF BLADDER TUMOR WITH GEMCITABINE;  Surgeon: Bjorn Pippin, MD;  Location: [facility];  Service: Urology;  Laterality: N/A;  GENERAL ANESTHESIA WITH PARALYSIS   UMBILICAL HERNIA REPAIR N/A 09/22/2021   Procedure: OPEN HERNIA REPAIR UMBILICAL ADULT;  Surgeon: Sebastian Ache, MD;  Location: [facility];  Service: Urology;  Laterality: N/A;    reports that he has quit smoking. His smoking use included cigarettes. He has a 30 pack-year smoking history. He has never used smokeless tobacco. He reports that he does not currently use alcohol. He reports that he does not use drugs. Social History   Socioeconomic History   Marital status: Married    Spouse name: Not on file   Number of children: Not on file   Years of education: Not on file   Highest education level: Not on file  Occupational History   Not on file  Tobacco Use   Smoking status: Former    Current packs/day: 1.00    Average packs/day: 1 pack/day for 30.0 years (30.0 ttl pk-yrs)    Types: Cigarettes   Smokeless tobacco: Never  Vaping Use   Vaping status: Never Used  Substance and Sexual Activity   Alcohol use: Not Currently    Comment: occ   Drug use: No   Sexual activity: Not on file  Other Topics Concern   Not on file  Social History Narrative   Not on file   Social Drivers of Health   Financial Resource Strain: Not on file  Food Insecurity: Patient Unable To Answer (11/06/2023)   Hunger Vital Sign    Worried About Running Out of Food in the Last Year: Patient unable to answer    Ran Out of Food in the Last Year: Patient unable to answer  Transportation Needs: Patient Unable To Answer (11/06/2023)   PRAPARE - Transportation     Lack of Transportation (Medical): Patient unable to answer    Lack of Transportation (Non-Medical): Patient unable to answer  Physical Activity: Not on file  Stress: Not on file  Social Connections: Patient Unable To Answer (11/06/2023)   Social Connection and Isolation Panel [NHANES]    Frequency of Communication with Friends and Family: Patient unable to answer    Frequency of Social Gatherings with Friends and Family: Patient unable to answer    Attends Religious Services: Patient unable to answer    Active Member of Clubs or Organizations: Patient unable to answer    Attends Banker Meetings: Patient unable to answer    Marital Status: Patient unable to answer  Intimate Partner Violence: Patient Unable To Answer (11/06/2023)   Humiliation, Afraid, Rape, and Kick questionnaire    Fear of Current or Ex-Partner: Patient unable to answer    Emotionally Abused: Patient unable to answer    Physically Abused: Patient unable to answer    Sexually Abused:  Patient unable to answer    Functional Status Survey:    No family history on file.  Health Maintenance  Topic Date Due   Pneumonia Vaccine 14+ Years old (1 of 2 - PCV) Never done   DTaP/Tdap/Td (1 - Tdap) Never done   Lung Cancer Screening  Never done   COVID-19 Vaccine (7 - 2024-25 season) 08/28/2023   Medicare Annual Wellness (AWV)  01/01/2024   INFLUENZA VACCINE  Completed   Zoster Vaccines- Shingrix  Completed   HPV VACCINES  Aged Out    No Known Allergies  Outpatient Encounter Medications as of 11/15/2023  Medication Sig   [Paused] atorvastatin (LIPITOR) 10 MG tablet Take 5 mg by mouth in the morning.   ketoconazole (NIZORAL) 2 % shampoo Apply 1 application  topically as needed for irritation.   lidocaine (LIDODERM) 5 % Place 1 patch onto the skin daily. Remove & Discard patch within 12 hours or as directed by MD   Multiple Vitamins-Minerals (MULTIVITAMIN GUMMIES ADULT PO) Take 1 tablet by mouth in the morning.    oxyCODONE (ROXICODONE) 5 MG immediate release tablet Take 1 tablet (5 mg total) by mouth every 8 (eight) hours as needed for moderate pain (pain score 4-6).   pantoprazole (PROTONIX) 40 MG tablet Take 1 tablet (40 mg total) by mouth daily.   sertraline (ZOLOFT) 50 MG tablet Take 50 mg by mouth in the morning.   No facility-administered encounter medications on file as of 11/15/2023.    Review of Systems  Constitutional:  Negative for fever.  Respiratory:  Negative for cough and shortness of breath.   Cardiovascular:  Negative for leg swelling.  Gastrointestinal:  Negative for abdominal pain, blood in stool, constipation, nausea and vomiting.  Genitourinary:  Negative for dysuria.  Neurological:  Negative for dizziness.  Psychiatric/Behavioral:  Negative for confusion.     There were no vitals filed for this visit. There is no height or weight on file to calculate BMI. BP Readings from Last 3 Encounters:  11/14/23 (!) 114/59  11/19/22 (!) 140/79  04/02/22 126/66   Wt Readings from Last 3 Encounters:  11/14/23 188 lb 15 oz (85.7 kg)  11/19/22 180 lb (81.6 kg)  03/30/22 175 lb (79.4 kg)   Physical Exam Constitutional:      Appearance: Normal appearance.  HENT:     Head: Normocephalic and atraumatic.  Cardiovascular:     Rate and Rhythm: Normal rate and regular rhythm.     Pulses: Normal pulses.     Heart sounds: Normal heart sounds.  Pulmonary:     Effort: No respiratory distress.     Breath sounds: No stridor. No wheezing or rales.  Abdominal:     General: Bowel sounds are normal. There is no distension.     Palpations: Abdomen is soft.     Tenderness: There is no abdominal tenderness. There is no guarding.     Comments: Wound vac in place Urostomy bag in place  Musculoskeletal:        General: No swelling.  Neurological:     Mental Status: He is alert. Mental status is at baseline.     Sensory: No sensory deficit.     Motor: No weakness.     Labs  reviewed: Basic Metabolic Panel: Recent Labs    11/09/23 1718 11/10/23 0751 11/11/23 0400 11/12/23 0430 11/13/23 0358 11/14/23 0414  NA  --  153*   < > 137 137 139  K  --  4.5   < >  3.9 4.4 4.4  CL  --  121*   < > 107 109 111  CO2  --  23   < > 21* 20* 22  GLUCOSE  --  136*   < > 109* 123* 107*  BUN  --  51*   < > 38* 40* 38*  CREATININE  --  2.31*   < > 2.08* 2.09* 2.12*  CALCIUM  --  8.4*   < > 7.5* 7.8* 8.2*  MG 2.5* 2.5*  --   --  2.1  --   PHOS 2.6 2.8  --   --  3.3  --    < > = values in this interval not displayed.   Liver Function Tests: Recent Labs    11/12/23 0430 11/13/23 0358 11/14/23 0414  AST 60* 86* 64*  ALT 90* 113* 108*  ALKPHOS 268* 267* 238*  BILITOT 0.6 0.5 0.6  PROT 5.0* 5.4* 5.8*  ALBUMIN 2.1* 2.2* 2.2*   No results for input(s): "LIPASE", "AMYLASE" in the last 8760 hours. No results for input(s): "AMMONIA" in the last 8760 hours. CBC: Recent Labs    11/04/23 1931 11/05/23 0537 11/07/23 0516 11/08/23 0535 11/12/23 0430 11/13/23 0358 11/14/23 0414  WBC 8.3   < > 13.1*   < > 11.1* 10.2 9.0  NEUTROABS 6.9  --  12.6*  --   --   --   --   HGB 14.1   < > 9.4*   < > 9.2* 9.0* 9.1*  HCT 42.7   < > 29.0*   < > 29.2* 29.4* 29.5*  MCV 85.6   < > 86.1   < > 88.8 89.1 89.4  PLT 146*   < > 49*   < > 134* 166 193   < > = values in this interval not displayed.   Cardiac Enzymes: No results for input(s): "CKTOTAL", "CKMB", "CKMBINDEX", "TROPONINI" in the last 8760 hours. BNP: Invalid input(s): "POCBNP" No results found for: "HGBA1C" No results found for: "TSH" No results found for: "VITAMINB12" No results found for: "FOLATE" Lab Results  Component Value Date   IRON 13 (L) 11/27/2021   TIBC 192 (L) 11/27/2021    Imaging and Procedures obtained prior to SNF admission: DG Chest Port 1 View Result Date: 11/05/2023 CLINICAL DATA:  Line placement EXAM: PORTABLE CHEST 1 VIEW COMPARISON:  11/05/2023 earlier FINDINGS: Stable ET tube and enteric tube.  Left IJ catheter in place with tip along the central SVC above the right atrium. No pneumothorax. Stable enlarged cardiopericardial silhouette with some prominence of central vasculature. Persistent right lung base opacity with small right effusion. Dense right upper lung nodule consistent with old granulomatous disease. IMPRESSION: New left IJ catheter.  No pneumothorax. Electronically Signed   By: Karen Kays M.D.   On: 11/05/2023 17:58   Portable Chest x-ray Result Date: 11/05/2023 CLINICAL DATA:  Intubation. EXAM: PORTABLE CHEST 1 VIEW COMPARISON:  Radiographs 11/04/2023 and 08/13/2023. FINDINGS: 1619 hours. Interval intubation. Tip of the endotracheal tube overlies the mid trachea. Enteric tube projects below the diaphragm, tip not visualized. The heart size and mediastinal contours are stable allowing for supine technique. Mildly increased opacities at both lung bases, greater on the right. No evidence of pneumothorax or significant pleural effusion. Unchanged calcified right upper lobe granuloma. No acute osseous findings. IMPRESSION: 1. Satisfactory position of support system. 2. Mildly increased bibasilar opacities, greater on the right, which could reflect atelectasis or pneumonia. Electronically Signed   By: Hilarie Fredrickson.D.  On: 11/05/2023 17:09   DG Abd Portable 1V-Small Bowel Obstruction Protocol-initial, 8 hr delay Result Date: 11/05/2023 CLINICAL DATA:  Small-bowel obstruction.  8 hour delayed film EXAM: PORTABLE ABDOMEN - 1 VIEW COMPARISON:  11/05/2023 at 1 a.m. FINDINGS: Abdominal x-ray obtained 8 hours after administration of small amount of oral contrast demonstrates a small amount of contrast in the fundus of the stomach. Enteric tube extending to the right of the spine, likely along the distal stomach. There is a right lower quadrant ostomy. The large bowel has scattered stool and is nondilated. No colonic contrast. There are several distended loops of small bowel in the mid abdomen  with central loop measuring up to 3.3 cm in diameter. No contrast in the small bowel. Curvature and moderate degenerative changes of the spine. IMPRESSION: Small amount of contrast overlying the fundus of the stomach. No significant other areas of small large bowel contrast. Persistent dilated loops of small bowel in the mid abdomen. Right-sided ostomy.  Diffuse scattered colonic stool. NG tube Electronically Signed   By: Karen Kays M.D.   On: 11/05/2023 13:36   DG Abd Portable 1V-Small Bowel Protocol-Position Verification Result Date: 11/05/2023 CLINICAL DATA:  NG tube placement EXAM: PORTABLE ABDOMEN - 1 VIEW COMPARISON:  CT today. FINDINGS: NG tube tip is in the stomach with the side port near the GE junction. IMPRESSION: NG tube in the proximal stomach. Electronically Signed   By: Charlett Nose M.D.   On: 11/05/2023 01:37   CT ABDOMEN PELVIS WO CONTRAST Result Date: 11/04/2023 CLINICAL DATA:  Acute nonlocalized abdominal pain. Worsening right upper quadrant abdominal pain tonight. Urostomy not draining. EXAM: CT ABDOMEN AND PELVIS WITHOUT CONTRAST TECHNIQUE: Multidetector CT imaging of the abdomen and pelvis was performed following the standard protocol without IV contrast. RADIATION DOSE REDUCTION: This exam was performed according to the departmental dose-optimization program which includes automated exposure control, adjustment of the mA and/or kV according to patient size and/or use of iterative reconstruction technique. COMPARISON:  08/15/2023 FINDINGS: Lower chest: Mild atelectasis in the lung bases. Small esophageal hiatal hernia. Hepatobiliary: Minimal free fluid along the anterior liver edge. No focal liver lesions. Gallbladder and bile ducts are unremarkable. Pancreas: Unremarkable. No pancreatic ductal dilatation or surrounding inflammatory changes. Spleen: Calcified granulomas in the spleen.  Normal-size. Adrenals/Urinary Tract: No adrenal gland nodules. Diffuse parenchymal atrophy involving  the left kidney. Bilateral hydronephrosis and hydroureter. A right lower quadrant urinary conduit is present. The urinary conduit is decompressed. Bladder is surgically absent. Stomach/Bowel: Stomach is fluid-filled without abnormal distention. Proximal small bowel are decompressed. Mid abdominal and right lower quadrant small bowel loops are dilated and fluid-filled with thickened wall. There appears to be tethering in the right lower quadrant likely indicating an area of adhesions. This is near the apex of the ileal conduit. Scattered stool throughout the colon. There is a ventral abdominal wall hernia at the umbilicus containing fat, cecum, and terminal ileum. There is no obvious obstruction at this level. Multiple areas of surgical anastomosis are demonstrated in small bowel. Vascular/Lymphatic: Aortic atherosclerosis. No enlarged abdominal or pelvic lymph nodes. Reproductive: Prostate gland appears to be surgically absent. Other: Small amount of free fluid in the mesentery surrounding dilated bowel loops. No free air. Musculoskeletal: Degenerative changes in the spine. No acute bony abnormalities. IMPRESSION: 1. Right lower quadrant ileal conduit. Ileal conduit is decompressed and there is bilateral hydronephrosis and hydroureter. Left renal atrophy. 2. Dilated mid to lower abdominal small bowel with small bowel wall thickening in this  area and mesenteric fluid. No pneumatosis. 3. Changes are likely to represent small bowel obstruction with possible vascular compromise causing small-bowel wall thickening. Level of obstruction appears to be in the right lower quadrant, likely adhesions. Obstruction may also be involving the proximal ileal conduit. 4. Aortic atherosclerosis. These results were called by telephone at the time of interpretation on 11/04/2023 at 9:41 pm to provider [facility] , who verbally acknowledged these results. Electronically Signed   By: Burman Nieves M.D.   On: 11/04/2023 21:45    DG Chest Portable 1 View Result Date: 11/04/2023 CLINICAL DATA:  Worsening right upper quadrant pain EXAM: PORTABLE CHEST 1 VIEW COMPARISON:  08/13/2023 FINDINGS: Single frontal view of the chest demonstrates an unremarkable cardiac silhouette. No airspace disease, effusion, or pneumothorax. No acute bony abnormalities. IMPRESSION: 1. No acute intrathoracic process. Electronically Signed   By: Sharlet Salina M.D.   On: 11/04/2023 21:10    Assessment and Plan        Small bowel obstruction status post expiratory laparotomy Denies abdominal pain, nausea, vomiting Tolerating p.o. food Continue wound VAC with twice weekly changes Follow-up with general surgery Continue oxycodone 5 mg p.o. 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    30 min Total time spent for obtaining history,  performing a medically appropriate examination and evaluation, reviewing the tests,   documenting clinical information in the electronic or other health record,   ,care coordination (not separately reported)

## 2023-11-22 ENCOUNTER — Non-Acute Institutional Stay (SKILLED_NURSING_FACILITY): Payer: Self-pay | Admitting: Nurse Practitioner

## 2023-11-22 ENCOUNTER — Encounter: Payer: Self-pay | Admitting: Nurse Practitioner

## 2023-11-22 DIAGNOSIS — L219 Seborrheic dermatitis, unspecified: Secondary | ICD-10-CM | POA: Insufficient documentation

## 2023-11-22 DIAGNOSIS — Z936 Other artificial openings of urinary tract status: Secondary | ICD-10-CM

## 2023-11-22 DIAGNOSIS — K56609 Unspecified intestinal obstruction, unspecified as to partial versus complete obstruction: Secondary | ICD-10-CM | POA: Diagnosis not present

## 2023-11-22 DIAGNOSIS — N1831 Chronic kidney disease, stage 3a: Secondary | ICD-10-CM | POA: Diagnosis not present

## 2023-11-22 DIAGNOSIS — D638 Anemia in other chronic diseases classified elsewhere: Secondary | ICD-10-CM | POA: Diagnosis not present

## 2023-11-22 DIAGNOSIS — K219 Gastro-esophageal reflux disease without esophagitis: Secondary | ICD-10-CM | POA: Insufficient documentation

## 2023-11-22 DIAGNOSIS — E785 Hyperlipidemia, unspecified: Secondary | ICD-10-CM | POA: Diagnosis not present

## 2023-11-22 DIAGNOSIS — F339 Major depressive disorder, recurrent, unspecified: Secondary | ICD-10-CM | POA: Insufficient documentation

## 2023-11-22 NOTE — Assessment & Plan Note (Signed)
 facial rash, scaly, dry, itching. Hx of dandruff, using Ketoconazole shampoo.  Apply 0.1% Triamcinolone/Nystatin cream bid for facial rash away from eyes until healed

## 2023-11-22 NOTE — Assessment & Plan Note (Signed)
 on Sertraline, stable.

## 2023-11-22 NOTE — Assessment & Plan Note (Signed)
 Hgb 9.1 11/14/23

## 2023-11-22 NOTE — Assessment & Plan Note (Signed)
on statin. 

## 2023-11-22 NOTE — Assessment & Plan Note (Signed)
 Hx of SBO (s/p ex-lap/SBR 02/2022 ). 11/05/23 closed -lop SBO with necrosis(resected) 2/2 tight band(nonfunctional L ureter)which was ligated

## 2023-11-22 NOTE — Assessment & Plan Note (Signed)
 Aggressive bladder/prostate CA requiring ileal conduit (followed by Urology - Dr. 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

## 2023-11-22 NOTE — Progress Notes (Signed)
 Location:   [facility] Room Number: 13 Place of Service:  SNF (31) Provider: Arna Snipe Edker Punt NP  Thana Ates, MD  Patient Care Team: Thana Ates, MD as PCP - General (Internal Medicine)  Extended Emergency Contact Information Primary Emergency Contact: [facility] Address: [address]          [place], [place] [address] D[place] Phone: [phone] Relation: Spouse Secondary Emergency Contact: [facility] Home Phone: [phone] Mobile Phone: [phone] Relation: Son  Code Status: DNR Goals of care: Advanced Directive information    11/08/2023    8:00 AM  Advanced Directives  Does Patient Have a Medical Advance Directive? Unable to assess, patient is non-responsive or altered mental status     Chief Complaint  Patient presents with   Acute Visit    Facial rash    HPI:  Pt is a 83 y.o. male seen today for an acute visit for facial rash, scaly, dry, itching. Hx of dandruff, using Ketoconazole shampoo.     Hospitalized 11/04/23-11/14/23 for AKI, abd sepsis, SOB-underwent exploratory laparotomy with LOA, SOR-wound vac-f/u Dr. Carolynne Edouard general surgery-underwent exploratory laparotomy with LOA, SOR-wound vac-f/u Dr. Carolynne Edouard general surgery  Hx of SBO (s/p ex-lap/SBR 02/2022 ). 11/05/23 closed -lop SBO with necrosis(resected) 2/2 tight band(nonfunctional L ureter)which was ligated  Aggressive bladder/prostate CA requiring ileal conduit (followed by Urology - Dr. 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   HLD on statin   CKD Bun/creat 38/2.212 11/14/23  Anemia, Hgb 9.1 11/14/23  GERD, taking Pantoprazole.   Depression, on Sertraline.     Past Medical History:  Diagnosis Date   Bladder cancer (HCC)    Chronic kidney disease    Dementia (HCC)    History of kidney stones    Medical history non-contributory     Meningitis    hx of as a child affected right eye   Past Surgical History:  Procedure Laterality Date   ciliodional cyst  yrs ago   COLONOSCOPY WITH PROPOFOL N/A 02/26/2017   Procedure: COLONOSCOPY WITH PROPOFOL;  Surgeon: Charolett Bumpers, MD;  Location: [facility];  Service: Endoscopy;  Laterality: N/A;   colonscopy  2008   HERNIA REPAIR     LAPAROTOMY N/A 11/05/2023   Procedure: EXPLORATORY LAPAROTOMY; lysis of adhesions; small bowel resection; ligation of left ureter; primary hernia repair;  Surgeon: Griselda Miner, MD;  Location: [facility];  Service: General;  Laterality: N/A;   LYMPH NODE DISSECTION Bilateral 09/22/2021   Procedure: LYMPH NODE DISSECTION;  Surgeon: Sebastian Ache, MD;  Location: [facility];  Service: Urology;  Laterality: Bilateral;   NEPHROLITHOTOMY Right 03/30/2022   Procedure: NEPHROLITHOTOMY PERCUTANEOUS;  Surgeon: Sebastian Ache, MD;  Location: [facility];  Service: Urology;  Laterality: Right;  2 HRS   NEPHROLITHOTOMY Right 03/31/2022   Procedure: RIGHT ANTEGRADE NEPHROGRAM; NEPHROSTOMY TUBE PLACEMENT;  Surgeon: Sebastian Ache, MD;  Location: [facility];  Service: Urology;  Laterality: Right;   ROBOT ASSISTED LAPAROSCOPIC COMPLETE CYSTECT ILEAL CONDUIT N/A 09/22/2021   Procedure: XI ROBOTIC ASSISTED LAPAROSCOPIC COMPLETE CYSTECT ILEAL CONDUIT WITH INDOCYANINE GREEN DYE;  Surgeon: Sebastian Ache, MD;  Location: [facility];  Service: Urology;  Laterality: N/A;  6 HRS   ROBOT ASSISTED LAPAROSCOPIC RADICAL PROSTATECTOMY N/A 09/22/2021   Procedure: XI ROBOTIC ASSISTED LAPAROSCOPIC RADICAL PROSTATECTOMY;  Surgeon: Sebastian Ache, MD;  Location: [facility];  Service: Urology;  Laterality: N/A;   TONSILLECTOMY  as  child   TRANSURETHRAL RESECTION OF BLADDER TUMOR WITH MITOMYCIN-C N/A 05/02/2021   Procedure: TRANSURETHRAL RESECTION OF BLADDER TUMOR WITH GEMCITABINE;  Surgeon: Bjorn Pippin, MD;  Location: [facility];  Service: Urology;  Laterality: N/A;  GENERAL ANESTHESIA WITH PARALYSIS   UMBILICAL  HERNIA REPAIR N/A 09/22/2021   Procedure: OPEN HERNIA REPAIR UMBILICAL ADULT;  Surgeon: Sebastian Ache, MD;  Location: [facility];  Service: Urology;  Laterality: N/A;    No Known Allergies  Allergies as of 11/22/2023   No Known Allergies      Medication List        Accurate as of November 22, 2023 11:38 AM. If you have any questions, ask your nurse or doctor.          PAUSE taking these medications    atorvastatin 10 MG tablet Wait to take this until your doctor or other care provider tells you to start again. Commonly known as: LIPITOR Take 5 mg by mouth in the morning.       TAKE these medications    ketoconazole 2 % shampoo Commonly known as: NIZORAL Apply 1 application  topically as needed for irritation.   lidocaine 5 % Commonly known as: LIDODERM Place 1 patch onto the skin daily. Remove & Discard patch within 12 hours or as directed by MD   MULTIVITAMIN GUMMIES ADULT PO Take 1 tablet by mouth in the morning.   oxyCODONE 5 MG immediate release tablet Commonly known as: Roxicodone Take 1 tablet (5 mg total) by mouth every 8 (eight) hours as needed for moderate pain (pain score 4-6).   pantoprazole 40 MG tablet Commonly known as: PROTONIX Take 1 tablet (40 mg total) by mouth daily.   sertraline 50 MG tablet Commonly known as: ZOLOFT Take 50 mg by mouth in the morning.        Review of Systems  Constitutional:  Negative for appetite change, fatigue and fever.  HENT:  Negative for congestion and trouble swallowing.   Eyes:  Negative for visual disturbance.  Respiratory:  Negative for cough and wheezing.   Cardiovascular:  Negative for palpitations and leg swelling.  Gastrointestinal:  Negative for abdominal pain, constipation, nausea and vomiting.  Genitourinary:        Ileal conduit.   Musculoskeletal:  Positive for gait problem.  Skin:  Positive for rash and wound.  Neurological:  Negative for tremors and headaches.  Psychiatric/Behavioral:   Negative for confusion, dysphoric mood and sleep disturbance. The patient is not nervous/anxious.     Immunization History  Administered Date(s) Administered   Influenza, High Dose Seasonal PF 07/03/2023   PFIZER Comirnaty(Gray Top)Covid-19 Tri-Sucrose Vaccine 01/06/2021   PFIZER(Purple Top)SARS-COV-2 Vaccination 09/30/2019, 10/19/2019, 06/07/2020   Pfizer Covid-19 Vaccine Bivalent Booster 46yrs & up 06/07/2021   Pfizer(Comirnaty)Fall Seasonal Vaccine 12 years and older 07/03/2023   Zoster Recombinant(Shingrix) 09/04/2018, 04/15/2019   Pertinent  Health Maintenance Due  Topic Date Due   INFLUENZA VACCINE  Completed      03/31/2022    8:39 AM 03/31/2022    8:30 PM 04/01/2022    8:00 AM 04/01/2022    9:30 PM 04/02/2022    8:00 AM  Fall Risk  (RETIRED) Patient Fall Risk Level Moderate fall risk Moderate fall risk Moderate fall risk Moderate fall risk Low fall risk   Functional Status Survey:    Vitals:   11/22/23 1109  BP: 113/65  Pulse: 65  Resp: 20  Temp: 98.2 F (36.8 C)  SpO2: 95%  Weight: 188 lb (85.3  kg)   Body mass index is 26.22 kg/m. Physical Exam Vitals and nursing note reviewed.  Constitutional:      Appearance: Normal appearance.  HENT:     Head: Normocephalic and atraumatic.     Nose: Nose normal.     Mouth/Throat:     Mouth: Mucous membranes are moist.  Eyes:     Extraocular Movements: Extraocular movements intact.     Conjunctiva/sclera: Conjunctivae normal.     Pupils: Pupils are equal, round, and reactive to light.  Cardiovascular:     Rate and Rhythm: Normal rate and regular rhythm.     Heart sounds: No murmur heard. Pulmonary:     Effort: Pulmonary effort is normal.     Breath sounds: No rales.  Abdominal:     General: Bowel sounds are normal.     Palpations: Abdomen is soft.     Tenderness: There is no abdominal tenderness.  Musculoskeletal:        General: No tenderness. Normal range of motion.     Cervical back: Normal range of motion  and neck supple.     Right lower leg: No edema.     Left lower leg: No edema.  Skin:    General: Skin is warm and dry.     Findings: Rash present.     Comments: Scaly dry itching facial rash, mostly eyebrows, nasolabial folds, under facial hair, behind of ears.   Left abd ileal conduit, mid lower abd wound vac/incision intact.   Neurological:     General: No focal deficit present.     Mental Status: He is alert and oriented to person, place, and time. Mental status is at baseline.     Motor: No weakness.     Coordination: Coordination normal.     Gait: Gait abnormal.  Psychiatric:        Mood and Affect: Mood normal.        Behavior: Behavior normal.        Thought Content: Thought content normal.        Judgment: Judgment normal.     Labs reviewed: Recent Labs    11/09/23 1718 11/10/23 0751 11/11/23 0400 11/12/23 0430 11/13/23 0358 11/14/23 0414  NA  --  153*   < > 137 137 139  K  --  4.5   < > 3.9 4.4 4.4  CL  --  121*   < > 107 109 111  CO2  --  23   < > 21* 20* 22  GLUCOSE  --  136*   < > 109* 123* 107*  BUN  --  51*   < > 38* 40* 38*  CREATININE  --  2.31*   < > 2.08* 2.09* 2.12*  CALCIUM  --  8.4*   < > 7.5* 7.8* 8.2*  MG 2.5* 2.5*  --   --  2.1  --   PHOS 2.6 2.8  --   --  3.3  --    < > = values in this interval not displayed.   Recent Labs    11/12/23 0430 11/13/23 0358 11/14/23 0414  AST 60* 86* 64*  ALT 90* 113* 108*  ALKPHOS 268* 267* 238*  BILITOT 0.6 0.5 0.6  PROT 5.0* 5.4* 5.8*  ALBUMIN 2.1* 2.2* 2.2*   Recent Labs    11/04/23 1931 11/05/23 0537 11/07/23 0516 11/08/23 0535 11/12/23 0430 11/13/23 0358 11/14/23 0414  WBC 8.3   < > 13.1*   < > 11.1* 10.2  9.0  NEUTROABS 6.9  --  12.6*  --   --   --   --   HGB 14.1   < > 9.4*   < > 9.2* 9.0* 9.1*  HCT 42.7   < > 29.0*   < > 29.2* 29.4* 29.5*  MCV 85.6   < > 86.1   < > 88.8 89.1 89.4  PLT 146*   < > 49*   < > 134* 166 193   < > = values in this interval not displayed.   No results found  for: "TSH" No results found for: "HGBA1C" Lab Results  Component Value Date   TRIG 493 (H) 11/06/2023    Significant Diagnostic Results in last 30 days:  DG CHEST PORT 1 VIEW Result Date: 11/09/2023 CLINICAL DATA:  Acute hypoxemic respiratory failure. EXAM: PORTABLE CHEST 1 VIEW COMPARISON:  One-view chest x-ray 11/06/2023 FINDINGS: The patient has been extubated. Gastric tube courses off the inferior border of the film. Previously noted left IJ line was removed. Aeration of both lungs is improved. Lung volumes remain low. Improved bibasilar airspace opacities remain, right greater than left. A small right pleural effusion is suspected. IMPRESSION: 1. Interval extubation. 2. Improved aeration of both lungs. 3. Improved bibasilar airspace disease, ill-defined opacities remain, right greater than left. 4. Small right pleural effusion. Electronically Signed   By: Marin Roberts M.D.   On: 11/09/2023 11:53   Portable Chest xray Result Date: 11/06/2023 CLINICAL DATA:  Endotracheal tube placement. EXAM: PORTABLE CHEST 1 VIEW COMPARISON:  November 05, 2023. FINDINGS: The heart size and mediastinal contours are within normal limits. Endotracheal and nasogastric tubes are unchanged in position. Left internal jugular catheter is unchanged. Increased bibasilar opacities are noted concerning for worsening atelectasis or infiltrates. The visualized skeletal structures are unremarkable. IMPRESSION: Stable support apparatus. Increased bibasilar opacities as noted above. Electronically Signed   By: Lupita Raider M.D.   On: 11/06/2023 09:39   DG Chest Port 1 View Result Date: 11/05/2023 CLINICAL DATA:  Line placement EXAM: PORTABLE CHEST 1 VIEW COMPARISON:  11/05/2023 earlier FINDINGS: Stable ET tube and enteric tube. Left IJ catheter in place with tip along the central SVC above the right atrium. No pneumothorax. Stable enlarged cardiopericardial silhouette with some prominence of central vasculature.  Persistent right lung base opacity with small right effusion. Dense right upper lung nodule consistent with old granulomatous disease. IMPRESSION: New left IJ catheter.  No pneumothorax. Electronically Signed   By: Karen Kays M.D.   On: 11/05/2023 17:58   Portable Chest x-ray Result Date: 11/05/2023 CLINICAL DATA:  Intubation. EXAM: PORTABLE CHEST 1 VIEW COMPARISON:  Radiographs 11/04/2023 and 08/13/2023. FINDINGS: 1619 hours. Interval intubation. Tip of the endotracheal tube overlies the mid trachea. Enteric tube projects below the diaphragm, tip not visualized. The heart size and mediastinal contours are stable allowing for supine technique. Mildly increased opacities at both lung bases, greater on the right. No evidence of pneumothorax or significant pleural effusion. Unchanged calcified right upper lobe granuloma. No acute osseous findings. IMPRESSION: 1. Satisfactory position of support system. 2. Mildly increased bibasilar opacities, greater on the right, which could reflect atelectasis or pneumonia. Electronically Signed   By: Carey Bullocks M.D.   On: 11/05/2023 17:09   DG Abd Portable 1V-Small Bowel Obstruction Protocol-initial, 8 hr delay Result Date: 11/05/2023 CLINICAL DATA:  Small-bowel obstruction.  8 hour delayed film EXAM: PORTABLE ABDOMEN - 1 VIEW COMPARISON:  11/05/2023 at 1 a.m. FINDINGS: Abdominal x-ray obtained 8 hours  after administration of small amount of oral contrast demonstrates a small amount of contrast in the fundus of the stomach. Enteric tube extending to the right of the spine, likely along the distal stomach. There is a right lower quadrant ostomy. The large bowel has scattered stool and is nondilated. No colonic contrast. There are several distended loops of small bowel in the mid abdomen with central loop measuring up to 3.3 cm in diameter. No contrast in the small bowel. Curvature and moderate degenerative changes of the spine. IMPRESSION: Small amount of contrast  overlying the fundus of the stomach. No significant other areas of small large bowel contrast. Persistent dilated loops of small bowel in the mid abdomen. Right-sided ostomy.  Diffuse scattered colonic stool. NG tube Electronically Signed   By: Karen Kays M.D.   On: 11/05/2023 13:36   DG Abd Portable 1V-Small Bowel Protocol-Position Verification Result Date: 11/05/2023 CLINICAL DATA:  NG tube placement EXAM: PORTABLE ABDOMEN - 1 VIEW COMPARISON:  CT today. FINDINGS: NG tube tip is in the stomach with the side port near the GE junction. IMPRESSION: NG tube in the proximal stomach. Electronically Signed   By: Charlett Nose M.D.   On: 11/05/2023 01:37   CT ABDOMEN PELVIS WO CONTRAST Result Date: 11/04/2023 CLINICAL DATA:  Acute nonlocalized abdominal pain. Worsening right upper quadrant abdominal pain tonight. Urostomy not draining. EXAM: CT ABDOMEN AND PELVIS WITHOUT CONTRAST TECHNIQUE: Multidetector CT imaging of the abdomen and pelvis was performed following the standard protocol without IV contrast. RADIATION DOSE REDUCTION: This exam was performed according to the departmental dose-optimization program which includes automated exposure control, adjustment of the mA and/or kV according to patient size and/or use of iterative reconstruction technique. COMPARISON:  08/15/2023 FINDINGS: Lower chest: Mild atelectasis in the lung bases. Small esophageal hiatal hernia. Hepatobiliary: Minimal free fluid along the anterior liver edge. No focal liver lesions. Gallbladder and bile ducts are unremarkable. Pancreas: Unremarkable. No pancreatic ductal dilatation or surrounding inflammatory changes. Spleen: Calcified granulomas in the spleen.  Normal-size. Adrenals/Urinary Tract: No adrenal gland nodules. Diffuse parenchymal atrophy involving the left kidney. Bilateral hydronephrosis and hydroureter. A right lower quadrant urinary conduit is present. The urinary conduit is decompressed. Bladder is surgically absent.  Stomach/Bowel: Stomach is fluid-filled without abnormal distention. Proximal small bowel are decompressed. Mid abdominal and right lower quadrant small bowel loops are dilated and fluid-filled with thickened wall. There appears to be tethering in the right lower quadrant likely indicating an area of adhesions. This is near the apex of the ileal conduit. Scattered stool throughout the colon. There is a ventral abdominal wall hernia at the umbilicus containing fat, cecum, and terminal ileum. There is no obvious obstruction at this level. Multiple areas of surgical anastomosis are demonstrated in small bowel. Vascular/Lymphatic: Aortic atherosclerosis. No enlarged abdominal or pelvic lymph nodes. Reproductive: Prostate gland appears to be surgically absent. Other: Small amount of free fluid in the mesentery surrounding dilated bowel loops. No free air. Musculoskeletal: Degenerative changes in the spine. No acute bony abnormalities. IMPRESSION: 1. Right lower quadrant ileal conduit. Ileal conduit is decompressed and there is bilateral hydronephrosis and hydroureter. Left renal atrophy. 2. Dilated mid to lower abdominal small bowel with small bowel wall thickening in this area and mesenteric fluid. No pneumatosis. 3. Changes are likely to represent small bowel obstruction with possible vascular compromise causing small-bowel wall thickening. Level of obstruction appears to be in the right lower quadrant, likely adhesions. Obstruction may also be involving the proximal ileal conduit. 4.  Aortic atherosclerosis. These results were called by telephone at the time of interpretation on 11/04/2023 at 9:41 pm to provider [facility] , who verbally acknowledged these results. Electronically Signed   By: Burman Nieves M.D.   On: 11/04/2023 21:45   DG Chest Portable 1 View Result Date: 11/04/2023 CLINICAL DATA:  Worsening right upper quadrant pain EXAM: PORTABLE CHEST 1 VIEW COMPARISON:  08/13/2023 FINDINGS: Single  frontal view of the chest demonstrates an unremarkable cardiac silhouette. No airspace disease, effusion, or pneumothorax. No acute bony abnormalities. IMPRESSION: 1. No acute intrathoracic process. Electronically Signed   By: Sharlet Salina M.D.   On: 11/04/2023 21:10    Assessment/Plan: Seborrheic dermatitis facial rash, scaly, dry, itching. Hx of dandruff, using Ketoconazole shampoo.  Apply 0.1% Triamcinolone/Nystatin cream bid for facial rash away from eyes until healed   SBO (small bowel obstruction) (HCC) Hx of SBO (s/p ex-lap/SBR 02/2022 ). 11/05/23 closed -lop SBO with necrosis(resected) 2/2 tight band(nonfunctional L ureter)which was ligated  S/P ileal conduit (HCC) Aggressive bladder/prostate CA requiring ileal conduit (followed by Urology - Dr. 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   HLD (hyperlipidemia) on statin   Chronic kidney disease, stage 3a (HCC) Bun/creat 38/2.212 11/14/23  Anemia of chronic disease Hgb 9.1 11/14/23  GERD (gastroesophageal reflux disease) taking Pantoprazole.     Family/ staff Communication: plan of care reviewed with the patient and charge nurse.   Labs/tests ordered:  none

## 2023-11-22 NOTE — Assessment & Plan Note (Signed)
taking Pantoprazole

## 2023-11-22 NOTE — Assessment & Plan Note (Signed)
 Bun/creat 38/2.212 11/14/23

## 2023-11-27 ENCOUNTER — Non-Acute Institutional Stay (SKILLED_NURSING_FACILITY): Admitting: Nurse Practitioner

## 2023-11-27 ENCOUNTER — Encounter: Payer: Self-pay | Admitting: Nurse Practitioner

## 2023-11-27 DIAGNOSIS — D638 Anemia in other chronic diseases classified elsewhere: Secondary | ICD-10-CM | POA: Diagnosis not present

## 2023-11-27 DIAGNOSIS — K219 Gastro-esophageal reflux disease without esophagitis: Secondary | ICD-10-CM | POA: Diagnosis not present

## 2023-11-27 DIAGNOSIS — F411 Generalized anxiety disorder: Secondary | ICD-10-CM

## 2023-11-27 DIAGNOSIS — N1831 Chronic kidney disease, stage 3a: Secondary | ICD-10-CM

## 2023-11-27 NOTE — Assessment & Plan Note (Addendum)
 identified lower Iron level 34 11/21/23, no active bleeding. Baseline Hgb 9s.  11/21/23 Iron 34, Vit B12 595, wbc 5.4, Hgb 9.0, plt 230, Na 139, K 4.6, Bun 40, creat 2.35 11/27/23 adding Fe 325mg  MWF, MiraLax MWF for bowel regimen.

## 2023-11-27 NOTE — Assessment & Plan Note (Signed)
 Bun/creat 40/2.35 11/21/23 at baseline.

## 2023-11-27 NOTE — Progress Notes (Signed)
 Location:   [facility] Room Number: 79 Place of Service:  SNF (31) Provider: Arna Snipe Geselle Cardosa NP  Thana Ates, MD  Patient Care Team: Thana Ates, MD as PCP - General (Internal Medicine)  Extended Emergency Contact Information Primary Emergency Contact: [facility] Address: [address]          [place], [place] [address] [place] Phone: [phone] Relation: Spouse Secondary Emergency Contact: [facility] Home Phone: [phone] Mobile Phone: [phone] Relation: Son  Code Status: DNR Goals of care: Advanced Directive information    11/08/2023    8:00 AM  Advanced Directives  Does Patient Have a Medical Advance Directive? Unable to assess, patient is non-responsive or altered mental status     Chief Complaint  Patient presents with  . Acute Visit    Iron deficiency anemia, chronic anemia.    HPI:  Pt is a 83 y.o. male seen today for an acute visit for identified lower Iron level 34 11/21/23, no active bleeding. Baseline Hgb 9s.      Seborrheic dermatitis, improved facial rash, scaly, dry, itching. Hx of dandruff, using Ketoconazole shampoo.              Hospitalized 11/04/23-11/14/23 for AKI, abd sepsis, SOB-underwent exploratory laparotomy with LOA, SOR-wound vac-f/u Dr. Carolynne Edouard general surgery-underwent exploratory laparotomy with LOA, SOR-wound vac-f/u Dr. Carolynne Edouard general surgery             Hx of SBO (s/p ex-lap/SBR 02/2022 ). 11/05/23 closed -lop SBO with necrosis(resected) 2/2 tight band(nonfunctional L ureter)which was ligated             Aggressive bladder/prostate CA requiring ileal conduit (followed by Urology - Dr. 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              HLD on statin               CKD Bun/creat 40/2.35 11/21/23 at baseline.              Anemia, Hgb 9.1 11/14/23>>9.0, Iron 34, Vit B12  595 11/21/23             GERD, taking Pantoprazole.              Depression, on Sertraline.                  Past Medical History:  Diagnosis Date  . Bladder cancer (HCC)   . Chronic kidney disease   . Dementia (HCC)   . History of kidney stones   . Medical history non-contributory   . Meningitis    hx of as a child affected right eye   Past Surgical History:  Procedure Laterality Date  . ciliodional cyst  yrs ago  . COLONOSCOPY WITH PROPOFOL N/A 02/26/2017   Procedure: COLONOSCOPY WITH PROPOFOL;  Surgeon: Charolett Bumpers, MD;  Location: [facility];  Service: Endoscopy;  Laterality: N/A;  . colonscopy  2008  . HERNIA REPAIR    . LAPAROTOMY N/A 11/05/2023   Procedure: EXPLORATORY LAPAROTOMY; lysis of adhesions; small bowel resection; ligation of left ureter; primary hernia repair;  Surgeon: Griselda Miner, MD;  Location: [facility];  Service: General;  Laterality: N/A;  . LYMPH NODE DISSECTION Bilateral 09/22/2021   Procedure: LYMPH NODE DISSECTION;  Surgeon: Sebastian Ache, MD;  Location: [facility];  Service: Urology;  Laterality: Bilateral;  . NEPHROLITHOTOMY Right 03/30/2022  Procedure: NEPHROLITHOTOMY PERCUTANEOUS;  Surgeon: Sebastian Ache, MD;  Location: [facility];  Service: Urology;  Laterality: Right;  2 HRS  . NEPHROLITHOTOMY Right 03/31/2022   Procedure: RIGHT ANTEGRADE NEPHROGRAM; NEPHROSTOMY TUBE PLACEMENT;  Surgeon: Sebastian Ache, MD;  Location: [facility];  Service: Urology;  Laterality: Right;  . ROBOT ASSISTED LAPAROSCOPIC COMPLETE CYSTECT ILEAL CONDUIT N/A 09/22/2021   Procedure: XI ROBOTIC ASSISTED LAPAROSCOPIC COMPLETE CYSTECT ILEAL CONDUIT WITH INDOCYANINE GREEN DYE;  Surgeon: Sebastian Ache, MD;  Location: [facility];  Service: Urology;  Laterality: N/A;  6 HRS  . ROBOT ASSISTED LAPAROSCOPIC RADICAL PROSTATECTOMY N/A 09/22/2021   Procedure: XI ROBOTIC ASSISTED LAPAROSCOPIC RADICAL PROSTATECTOMY;  Surgeon: Sebastian Ache, MD;  Location: [facility];  Service: Urology;  Laterality:  N/A;  . TONSILLECTOMY  as child  . TRANSURETHRAL RESECTION OF BLADDER TUMOR WITH MITOMYCIN-C N/A 05/02/2021   Procedure: TRANSURETHRAL RESECTION OF BLADDER TUMOR WITH GEMCITABINE;  Surgeon: Bjorn Pippin, MD;  Location: [facility];  Service: Urology;  Laterality: N/A;  GENERAL ANESTHESIA WITH PARALYSIS  . UMBILICAL HERNIA REPAIR N/A 09/22/2021   Procedure: OPEN HERNIA REPAIR UMBILICAL ADULT;  Surgeon: Sebastian Ache, MD;  Location: [facility];  Service: Urology;  Laterality: N/A;    No Known Allergies  Allergies as of 11/27/2023   No Known Allergies      Medication List        Accurate as of November 27, 2023  3:48 PM. If you have any questions, ask your nurse or doctor.          PAUSE taking these medications    atorvastatin 10 MG tablet Wait to take this until your doctor or other care provider tells you to start again. Commonly known as: LIPITOR Take 5 mg by mouth in the morning.       TAKE these medications    ketoconazole 2 % shampoo Commonly known as: NIZORAL Apply 1 application  topically as needed for irritation.   lidocaine 5 % Commonly known as: LIDODERM Place 1 patch onto the skin daily. Remove & Discard patch within 12 hours or as directed by MD   MULTIVITAMIN GUMMIES ADULT PO Take 1 tablet by mouth in the morning.   oxyCODONE 5 MG immediate release tablet Commonly known as: Roxicodone Take 1 tablet (5 mg total) by mouth every 8 (eight) hours as needed for moderate pain (pain score 4-6).   pantoprazole 40 MG tablet Commonly known as: PROTONIX Take 1 tablet (40 mg total) by mouth daily.   sertraline 50 MG tablet Commonly known as: ZOLOFT Take 50 mg by mouth in the morning.        Review of Systems  Constitutional:  Negative for appetite change, fatigue and fever.  HENT:  Negative for congestion and trouble swallowing.   Eyes:  Negative for visual disturbance.  Respiratory:  Negative for cough and wheezing.   Cardiovascular:  Negative for palpitations  and leg swelling.  Gastrointestinal:  Negative for abdominal pain, constipation, nausea and vomiting.  Genitourinary:        Ileal conduit.   Musculoskeletal:  Positive for gait problem.  Skin:  Positive for wound. Negative for rash.  Neurological:  Negative for tremors and headaches.  Psychiatric/Behavioral:  Negative for confusion, dysphoric mood and sleep disturbance. The patient is not nervous/anxious.     Immunization History  Administered Date(s) Administered  . Influenza, High Dose Seasonal PF 07/03/2023  . PFIZER Comirnaty(Gray Top)Covid-19 Tri-Sucrose Vaccine 01/06/2021  . PFIZER(Purple Top)SARS-COV-2 Vaccination 09/30/2019, 10/19/2019, 06/07/2020  . Pfizer Covid-19 Vaccine  Bivalent Booster 36yrs & up 06/07/2021  . Pfizer(Comirnaty)Fall Seasonal Vaccine 12 years and older 07/03/2023  . Zoster Recombinant(Shingrix) 09/04/2018, 04/15/2019   Pertinent  Health Maintenance Due  Topic Date Due  . INFLUENZA VACCINE  Completed      03/31/2022    8:39 AM 03/31/2022    8:30 PM 04/01/2022    8:00 AM 04/01/2022    9:30 PM 04/02/2022    8:00 AM  Fall Risk  (RETIRED) Patient Fall Risk Level Moderate fall risk Moderate fall risk Moderate fall risk Moderate fall risk Low fall risk   Functional Status Survey:    Vitals:   11/27/23 1234  BP: 117/65  Pulse: 73  Resp: 16  Temp: 97.7 F (36.5 C)  SpO2: 95%  Weight: 188 lb (85.3 kg)   Body mass index is 26.22 kg/m. Physical Exam Vitals and nursing note reviewed.  Constitutional:      Appearance: Normal appearance.  HENT:     Head: Normocephalic and atraumatic.     Nose: Nose normal.     Mouth/Throat:     Mouth: Mucous membranes are moist.  Eyes:     Extraocular Movements: Extraocular movements intact.     Conjunctiva/sclera: Conjunctivae normal.     Pupils: Pupils are equal, round, and reactive to light.  Cardiovascular:     Rate and Rhythm: Normal rate and regular rhythm.     Heart sounds: No murmur heard. Pulmonary:      Effort: Pulmonary effort is normal.     Breath sounds: No rales.  Abdominal:     General: Bowel sounds are normal.     Palpations: Abdomen is soft.     Tenderness: There is no abdominal tenderness.  Musculoskeletal:        General: No tenderness. Normal range of motion.     Cervical back: Normal range of motion and neck supple.     Right lower leg: No edema.     Left lower leg: No edema.  Skin:    General: Skin is warm and dry.     Findings: No rash.     Comments: Improved scaly dry itching facial rash, mostly eyebrows, nasolabial folds, under facial hair, behind of ears.   Left abd ileal conduit, mid lower abd wound vac/incision intact.   Neurological:     General: No focal deficit present.     Mental Status: He is alert and oriented to person, place, and time. Mental status is at baseline.     Motor: No weakness.     Coordination: Coordination normal.     Gait: Gait abnormal.  Psychiatric:        Mood and Affect: Mood normal.        Behavior: Behavior normal.        Thought Content: Thought content normal.        Judgment: Judgment normal.    Labs reviewed: Recent Labs    11/09/23 1718 11/10/23 0751 11/11/23 0400 11/12/23 0430 11/13/23 0358 11/14/23 0414  NA  --  153*   < > 137 137 139  K  --  4.5   < > 3.9 4.4 4.4  CL  --  121*   < > 107 109 111  CO2  --  23   < > 21* 20* 22  GLUCOSE  --  136*   < > 109* 123* 107*  BUN  --  51*   < > 38* 40* 38*  CREATININE  --  2.31*   < >  2.08* 2.09* 2.12*  CALCIUM  --  8.4*   < > 7.5* 7.8* 8.2*  MG 2.5* 2.5*  --   --  2.1  --   PHOS 2.6 2.8  --   --  3.3  --    < > = values in this interval not displayed.   Recent Labs    11/12/23 0430 11/13/23 0358 11/14/23 0414  AST 60* 86* 64*  ALT 90* 113* 108*  ALKPHOS 268* 267* 238*  BILITOT 0.6 0.5 0.6  PROT 5.0* 5.4* 5.8*  ALBUMIN 2.1* 2.2* 2.2*   Recent Labs    11/04/23 1931 11/05/23 0537 11/07/23 0516 11/08/23 0535 11/12/23 0430 11/13/23 0358 11/14/23 0414  WBC  8.3   < > 13.1*   < > 11.1* 10.2 9.0  NEUTROABS 6.9  --  12.6*  --   --   --   --   HGB 14.1   < > 9.4*   < > 9.2* 9.0* 9.1*  HCT 42.7   < > 29.0*   < > 29.2* 29.4* 29.5*  MCV 85.6   < > 86.1   < > 88.8 89.1 89.4  PLT 146*   < > 49*   < > 134* 166 193   < > = values in this interval not displayed.   No results found for: "TSH" No results found for: "HGBA1C" Lab Results  Component Value Date   TRIG 493 (H) 11/06/2023    Significant Diagnostic Results in last 30 days:  DG CHEST PORT 1 VIEW Result Date: 11/09/2023 CLINICAL DATA:  Acute hypoxemic respiratory failure. EXAM: PORTABLE CHEST 1 VIEW COMPARISON:  One-view chest x-ray 11/06/2023 FINDINGS: The patient has been extubated. Gastric tube courses off the inferior border of the film. Previously noted left IJ line was removed. Aeration of both lungs is improved. Lung volumes remain low. Improved bibasilar airspace opacities remain, right greater than left. A small right pleural effusion is suspected. IMPRESSION: 1. Interval extubation. 2. Improved aeration of both lungs. 3. Improved bibasilar airspace disease, ill-defined opacities remain, right greater than left. 4. Small right pleural effusion. Electronically Signed   By: Marin Roberts M.D.   On: 11/09/2023 11:53   Portable Chest xray Result Date: 11/06/2023 CLINICAL DATA:  Endotracheal tube placement. EXAM: PORTABLE CHEST 1 VIEW COMPARISON:  November 05, 2023. FINDINGS: The heart size and mediastinal contours are within normal limits. Endotracheal and nasogastric tubes are unchanged in position. Left internal jugular catheter is unchanged. Increased bibasilar opacities are noted concerning for worsening atelectasis or infiltrates. The visualized skeletal structures are unremarkable. IMPRESSION: Stable support apparatus. Increased bibasilar opacities as noted above. Electronically Signed   By: Lupita Raider M.D.   On: 11/06/2023 09:39   DG Chest Port 1 View Result Date:  11/05/2023 CLINICAL DATA:  Line placement EXAM: PORTABLE CHEST 1 VIEW COMPARISON:  11/05/2023 earlier FINDINGS: Stable ET tube and enteric tube. Left IJ catheter in place with tip along the central SVC above the right atrium. No pneumothorax. Stable enlarged cardiopericardial silhouette with some prominence of central vasculature. Persistent right lung base opacity with small right effusion. Dense right upper lung nodule consistent with old granulomatous disease. IMPRESSION: New left IJ catheter.  No pneumothorax. Electronically Signed   By: Karen Kays M.D.   On: 11/05/2023 17:58   Portable Chest x-ray Result Date: 11/05/2023 CLINICAL DATA:  Intubation. EXAM: PORTABLE CHEST 1 VIEW COMPARISON:  Radiographs 11/04/2023 and 08/13/2023. FINDINGS: 1619 hours. Interval intubation. Tip of the endotracheal tube  overlies the mid trachea. Enteric tube projects below the diaphragm, tip not visualized. The heart size and mediastinal contours are stable allowing for supine technique. Mildly increased opacities at both lung bases, greater on the right. No evidence of pneumothorax or significant pleural effusion. Unchanged calcified right upper lobe granuloma. No acute osseous findings. IMPRESSION: 1. Satisfactory position of support system. 2. Mildly increased bibasilar opacities, greater on the right, which could reflect atelectasis or pneumonia. Electronically Signed   By: Carey Bullocks M.D.   On: 11/05/2023 17:09   DG Abd Portable 1V-Small Bowel Obstruction Protocol-initial, 8 hr delay Result Date: 11/05/2023 CLINICAL DATA:  Small-bowel obstruction.  8 hour delayed film EXAM: PORTABLE ABDOMEN - 1 VIEW COMPARISON:  11/05/2023 at 1 a.m. FINDINGS: Abdominal x-ray obtained 8 hours after administration of small amount of oral contrast demonstrates a small amount of contrast in the fundus of the stomach. Enteric tube extending to the right of the spine, likely along the distal stomach. There is a right lower quadrant  ostomy. The large bowel has scattered stool and is nondilated. No colonic contrast. There are several distended loops of small bowel in the mid abdomen with central loop measuring up to 3.3 cm in diameter. No contrast in the small bowel. Curvature and moderate degenerative changes of the spine. IMPRESSION: Small amount of contrast overlying the fundus of the stomach. No significant other areas of small large bowel contrast. Persistent dilated loops of small bowel in the mid abdomen. Right-sided ostomy.  Diffuse scattered colonic stool. NG tube Electronically Signed   By: Karen Kays M.D.   On: 11/05/2023 13:36   DG Abd Portable 1V-Small Bowel Protocol-Position Verification Result Date: 11/05/2023 CLINICAL DATA:  NG tube placement EXAM: PORTABLE ABDOMEN - 1 VIEW COMPARISON:  CT today. FINDINGS: NG tube tip is in the stomach with the side port near the GE junction. IMPRESSION: NG tube in the proximal stomach. Electronically Signed   By: Charlett Nose M.D.   On: 11/05/2023 01:37   CT ABDOMEN PELVIS WO CONTRAST Result Date: 11/04/2023 CLINICAL DATA:  Acute nonlocalized abdominal pain. Worsening right upper quadrant abdominal pain tonight. Urostomy not draining. EXAM: CT ABDOMEN AND PELVIS WITHOUT CONTRAST TECHNIQUE: Multidetector CT imaging of the abdomen and pelvis was performed following the standard protocol without IV contrast. RADIATION DOSE REDUCTION: This exam was performed according to the departmental dose-optimization program which includes automated exposure control, adjustment of the mA and/or kV according to patient size and/or use of iterative reconstruction technique. COMPARISON:  08/15/2023 FINDINGS: Lower chest: Mild atelectasis in the lung bases. Small esophageal hiatal hernia. Hepatobiliary: Minimal free fluid along the anterior liver edge. No focal liver lesions. Gallbladder and bile ducts are unremarkable. Pancreas: Unremarkable. No pancreatic ductal dilatation or surrounding inflammatory  changes. Spleen: Calcified granulomas in the spleen.  Normal-size. Adrenals/Urinary Tract: No adrenal gland nodules. Diffuse parenchymal atrophy involving the left kidney. Bilateral hydronephrosis and hydroureter. A right lower quadrant urinary conduit is present. The urinary conduit is decompressed. Bladder is surgically absent. Stomach/Bowel: Stomach is fluid-filled without abnormal distention. Proximal small bowel are decompressed. Mid abdominal and right lower quadrant small bowel loops are dilated and fluid-filled with thickened wall. There appears to be tethering in the right lower quadrant likely indicating an area of adhesions. This is near the apex of the ileal conduit. Scattered stool throughout the colon. There is a ventral abdominal wall hernia at the umbilicus containing fat, cecum, and terminal ileum. There is no obvious obstruction at this level. Multiple areas of  surgical anastomosis are demonstrated in small bowel. Vascular/Lymphatic: Aortic atherosclerosis. No enlarged abdominal or pelvic lymph nodes. Reproductive: Prostate gland appears to be surgically absent. Other: Small amount of free fluid in the mesentery surrounding dilated bowel loops. No free air. Musculoskeletal: Degenerative changes in the spine. No acute bony abnormalities. IMPRESSION: 1. Right lower quadrant ileal conduit. Ileal conduit is decompressed and there is bilateral hydronephrosis and hydroureter. Left renal atrophy. 2. Dilated mid to lower abdominal small bowel with small bowel wall thickening in this area and mesenteric fluid. No pneumatosis. 3. Changes are likely to represent small bowel obstruction with possible vascular compromise causing small-bowel wall thickening. Level of obstruction appears to be in the right lower quadrant, likely adhesions. Obstruction may also be involving the proximal ileal conduit. 4. Aortic atherosclerosis. These results were called by telephone at the time of interpretation on 11/04/2023 at  9:41 pm to provider [facility] , who verbally acknowledged these results. Electronically Signed   By: Burman Nieves M.D.   On: 11/04/2023 21:45   DG Chest Portable 1 View Result Date: 11/04/2023 CLINICAL DATA:  Worsening right upper quadrant pain EXAM: PORTABLE CHEST 1 VIEW COMPARISON:  08/13/2023 FINDINGS: Single frontal view of the chest demonstrates an unremarkable cardiac silhouette. No airspace disease, effusion, or pneumothorax. No acute bony abnormalities. IMPRESSION: 1. No acute intrathoracic process. Electronically Signed   By: Sharlet Salina M.D.   On: 11/04/2023 21:10    Assessment/Plan: Anemia of chronic disease identified lower Iron level 34 11/21/23, no active bleeding. Baseline Hgb 9s.  11/21/23 Iron 34, Vit B12 595, wbc 5.4, Hgb 9.0, plt 230, Na 139, K 4.6, Bun 40, creat 2.35 11/27/23 adding Fe 325mg  MWF, MiraLax MWF for bowel regimen.   Chronic kidney disease, stage 3a (HCC) Bun/creat 40/2.35 11/21/23 at baseline.   GERD (gastroesophageal reflux disease) Stable,  taking Pantoprazole.   Generalized anxiety disorder Stable, on Sertraline.     Family/ staff Communication: plan of care reviewed with the patient and charge nurse.   Labs/tests ordered:  none

## 2023-11-27 NOTE — Assessment & Plan Note (Signed)
 Stable, on Sertraline.

## 2023-11-27 NOTE — Assessment & Plan Note (Signed)
 Stable, taking Pantoprazole

## 2023-12-10 DIAGNOSIS — N1832 Chronic kidney disease, stage 3b: Secondary | ICD-10-CM | POA: Diagnosis not present

## 2023-12-10 DIAGNOSIS — N189 Chronic kidney disease, unspecified: Secondary | ICD-10-CM | POA: Diagnosis not present

## 2023-12-10 DIAGNOSIS — D631 Anemia in chronic kidney disease: Secondary | ICD-10-CM | POA: Diagnosis not present

## 2023-12-10 DIAGNOSIS — N184 Chronic kidney disease, stage 4 (severe): Secondary | ICD-10-CM | POA: Diagnosis not present

## 2023-12-10 DIAGNOSIS — N139 Obstructive and reflux uropathy, unspecified: Secondary | ICD-10-CM | POA: Diagnosis not present

## 2023-12-10 DIAGNOSIS — I129 Hypertensive chronic kidney disease with stage 1 through stage 4 chronic kidney disease, or unspecified chronic kidney disease: Secondary | ICD-10-CM | POA: Diagnosis not present

## 2023-12-11 DIAGNOSIS — R2681 Unsteadiness on feet: Secondary | ICD-10-CM | POA: Diagnosis not present

## 2023-12-12 DIAGNOSIS — R2681 Unsteadiness on feet: Secondary | ICD-10-CM | POA: Diagnosis not present

## 2023-12-13 DIAGNOSIS — R2681 Unsteadiness on feet: Secondary | ICD-10-CM | POA: Diagnosis not present

## 2023-12-16 DIAGNOSIS — R2681 Unsteadiness on feet: Secondary | ICD-10-CM | POA: Diagnosis not present

## 2023-12-17 DIAGNOSIS — R2681 Unsteadiness on feet: Secondary | ICD-10-CM | POA: Diagnosis not present

## 2023-12-18 ENCOUNTER — Encounter: Payer: Self-pay | Admitting: Nurse Practitioner

## 2023-12-18 ENCOUNTER — Non-Acute Institutional Stay (SKILLED_NURSING_FACILITY): Payer: Self-pay | Admitting: Nurse Practitioner

## 2023-12-18 DIAGNOSIS — D638 Anemia in other chronic diseases classified elsewhere: Secondary | ICD-10-CM | POA: Diagnosis not present

## 2023-12-18 DIAGNOSIS — N1831 Chronic kidney disease, stage 3a: Secondary | ICD-10-CM

## 2023-12-18 DIAGNOSIS — K219 Gastro-esophageal reflux disease without esophagitis: Secondary | ICD-10-CM

## 2023-12-18 DIAGNOSIS — F339 Major depressive disorder, recurrent, unspecified: Secondary | ICD-10-CM | POA: Diagnosis not present

## 2023-12-18 DIAGNOSIS — R2681 Unsteadiness on feet: Secondary | ICD-10-CM | POA: Diagnosis not present

## 2023-12-18 NOTE — Assessment & Plan Note (Signed)
 Stable, on Sertraline.

## 2023-12-18 NOTE — Assessment & Plan Note (Signed)
 taking Iron, Hgb 9.1 11/14/23>>9.0, Iron 34, Vit B12 595 11/21/23

## 2023-12-18 NOTE — Assessment & Plan Note (Signed)
 Stable, taking Pantoprazole

## 2023-12-18 NOTE — Progress Notes (Unsigned)
 Location:   [facility] Room Number: 98 Place of Service:  SNF (31) Provider: Arna Snipe Heavenleigh Petruzzi NP  Thana Ates, MD  Patient Care Team: Thana Ates, MD as PCP - General (Internal Medicine)  Extended Emergency Contact Information Primary Emergency Contact: [facility] Address: [address]          [place], [place] [address] Darden Amber of [place] Phone: [phone] Relation: Spouse Secondary Emergency Contact: [facility] Home Phone: [phone] Mobile Phone: [phone] Relation: Son  Code Status:  DNR Goals of care: Advanced Directive information    11/08/2023    8:00 AM  Advanced Directives  Does Patient Have a Medical Advance Directive? Unable to assess, patient is non-responsive or altered mental status     Chief Complaint  Patient presents with  . Medical Management of Chronic Issues    HPI:  Pt is a 83 y.o. male seen today for medical management of chronic diseases.                Seborrheic dermatitis, resolved facial rash, scaly, dry, itching. Hx of dandruff, using Ketoconazole shampoo.              Hospitalized 11/04/23-11/14/23 for AKI, abd sepsis, SOB-underwent exploratory laparotomy with LOA, SOR-wound vac-f/u Dr. Carolynne Edouard general surgery-underwent exploratory laparotomy with LOA, SOR-wound vac-f/u Dr. Carolynne Edouard general surgery             Hx of SBO (s/p ex-lap/SBR 02/2022 ). 11/05/23 closed -lop SBO with necrosis(resected) 2/2 tight band(nonfunctional L ureter)which was ligated             Aggressive bladder/prostate CA requiring ileal conduit (followed by Urology - Dr. 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              HLD on statin               CKD Bun/creat 40/2.35 11/21/23 at baseline.              Anemia, taking Iron, Hgb 9.1 11/14/23>>9.0, Iron 34, Vit B12 595 11/21/23             GERD, taking Pantoprazole.               Depression, on Sertraline.    Past Medical History:  Diagnosis Date  . Bladder cancer (HCC)   . Chronic kidney disease   . Dementia (HCC)   . History of kidney stones   . Medical history non-contributory   . Meningitis    hx of as a child affected right eye   Past Surgical History:  Procedure Laterality Date  . ciliodional cyst  yrs ago  . COLONOSCOPY WITH PROPOFOL N/A 02/26/2017   Procedure: COLONOSCOPY WITH PROPOFOL;  Surgeon: Charolett Bumpers, MD;  Location: [facility];  Service: Endoscopy;  Laterality: N/A;  . colonscopy  2008  . HERNIA REPAIR    . LAPAROTOMY N/A 11/05/2023   Procedure: EXPLORATORY LAPAROTOMY; lysis of adhesions; small bowel resection; ligation of left ureter; primary hernia repair;  Surgeon: Griselda Miner, MD;  Location: [facility];  Service: General;  Laterality: N/A;  . LYMPH NODE DISSECTION Bilateral 09/22/2021   Procedure: LYMPH NODE DISSECTION;  Surgeon: Sebastian Ache, MD;  Location: [facility];  Service: Urology;  Laterality: Bilateral;  . NEPHROLITHOTOMY Right 03/30/2022   Procedure: NEPHROLITHOTOMY PERCUTANEOUS;  Surgeon: Sebastian Ache, MD;  [facility]: [facility];  Service: Urology;  Laterality:  Right;  2 HRS  . NEPHROLITHOTOMY Right 03/31/2022   Procedure: RIGHT ANTEGRADE NEPHROGRAM; NEPHROSTOMY TUBE PLACEMENT;  Surgeon: Sebastian Ache, MD;  [facility];  Service: Urology;  Laterality: Right;  . ROBOT ASSISTED LAPAROSCOPIC COMPLETE CYSTECT ILEAL CONDUIT N/A 09/22/2021   Procedure: XI ROBOTIC ASSISTED LAPAROSCOPIC COMPLETE CYSTECT ILEAL CONDUIT WITH INDOCYANINE GREEN DYE;  Surgeon: Sebastian Ache, MD;  [facility];  Service: Urology;  Laterality: N/A;  6 HRS  . ROBOT ASSISTED LAPAROSCOPIC RADICAL PROSTATECTOMY N/A 09/22/2021   Procedure: XI ROBOTIC ASSISTED LAPAROSCOPIC RADICAL PROSTATECTOMY;  Surgeon: Sebastian Ache, MD;  [facility];  Service: Urology;  Laterality: N/A;  . TONSILLECTOMY  as child  . TRANSURETHRAL RESECTION OF BLADDER  TUMOR WITH MITOMYCIN-C N/A 05/02/2021   Procedure: TRANSURETHRAL RESECTION OF BLADDER TUMOR WITH GEMCITABINE;  Surgeon: Bjorn Pippin, MD;  [facility];  Service: Urology;  Laterality: N/A;  GENERAL ANESTHESIA WITH PARALYSIS  . UMBILICAL HERNIA REPAIR N/A 09/22/2021   Procedure: OPEN HERNIA REPAIR UMBILICAL ADULT;  Surgeon: Sebastian Ache, MD;  [facility];  Service: Urology;  Laterality: N/A;    No Known Allergies  Allergies as of 12/18/2023   No Known Allergies      Medication List        Accurate as of December 18, 2023  3:50 PM. If you have any questions, ask your nurse or doctor.          PAUSE taking these medications    atorvastatin 10 MG tablet Wait to take this until your doctor or other care provider tells you to start again. Commonly known as: LIPITOR Take 5 mg by mouth in the morning.       TAKE these medications    ketoconazole 2 % shampoo Commonly known as: NIZORAL Apply 1 application  topically as needed for irritation.   lidocaine 5 % Commonly known as: LIDODERM Place 1 patch onto the skin daily. Remove & Discard patch within 12 hours or as directed by MD   MULTIVITAMIN GUMMIES ADULT PO Take 1 tablet by mouth in the morning.   oxyCODONE 5 MG immediate release tablet Commonly known as: Roxicodone Take 1 tablet (5 mg total) by mouth every 8 (eight) hours as needed for moderate pain (pain score 4-6).   pantoprazole 40 MG tablet Commonly known as: PROTONIX Take 1 tablet (40 mg total) by mouth daily.   sertraline 50 MG tablet Commonly known as: ZOLOFT Take 50 mg by mouth in the morning.        Review of Systems  Constitutional:  Negative for appetite change, fatigue and fever.  HENT:  Negative for congestion and trouble swallowing.   Eyes:  Negative for visual disturbance.  Respiratory:  Negative for cough and wheezing.   Cardiovascular:  Negative for palpitations and leg swelling.  Gastrointestinal:  Negative for abdominal pain and  constipation.  Genitourinary:        Ileal conduit.   Musculoskeletal:  Positive for gait problem.  Skin:  Negative for rash and wound.  Neurological:  Negative for tremors and headaches.  Psychiatric/Behavioral:  Negative for confusion, dysphoric mood and sleep disturbance. The patient is not nervous/anxious.     Immunization History  Administered Date(s) Administered  . Influenza, High Dose Seasonal PF 07/03/2023  . PFIZER Comirnaty(Gray Top)Covid-19 Tri-Sucrose Vaccine 01/06/2021  . PFIZER(Purple Top)SARS-COV-2 Vaccination 09/30/2019, 10/19/2019, 06/07/2020  . 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 Pfizer(Comirnaty)Fall Seasonal Vaccine 12 years and older 07/03/2023  . Zoster Recombinant(Shingrix)  09/04/2018, 04/15/2019   Pertinent  Health Maintenance Due  Topic Date Due  . INFLUENZA VACCINE  04/10/2024      03/31/2022    8:39 AM 03/31/2022    8:30 PM 04/01/2022    8:00 AM 04/01/2022    9:30 PM 04/02/2022    8:00 AM  Fall Risk  (RETIRED) Patient Fall Risk Level Moderate fall risk Moderate fall risk Moderate fall risk Moderate fall risk Low fall risk   Functional Status Survey:    Vitals:   12/18/23 1539  BP: 117/67  Pulse: 70  Resp: 18  Temp: 98.1 F (36.7 C)  SpO2: 96%  Weight: 176 lb 1.6 oz (79.9 kg)   Body mass index is 24.56 kg/m. Physical Exam Vitals and nursing note reviewed.  Constitutional:      Appearance: Normal appearance.  HENT:     Head: Normocephalic and atraumatic.     Nose: Nose normal.     Mouth/Throat:     Mouth: Mucous membranes are moist.  Eyes:     Extraocular Movements: Extraocular movements intact.     Conjunctiva/sclera: Conjunctivae normal.     Pupils: Pupils are equal, round, and reactive to light.  Cardiovascular:     Rate and Rhythm: Normal rate and regular rhythm.     Heart sounds: No murmur heard. Pulmonary:     Effort: Pulmonary effort is normal.     Breath sounds: No rales.  Abdominal:      General: Bowel sounds are normal.     Palpations: Abdomen is soft.     Tenderness: There is no abdominal tenderness.  Musculoskeletal:        General: No tenderness. Normal range of motion.     Cervical back: Normal range of motion and neck supple.     Right lower leg: No edema.     Left lower leg: No edema.  Skin:    General: Skin is warm and dry.     Findings: No rash.     Comments: Left abd ileal conduit, mid lower abd wound vac/incision healed.   Neurological:     General: No focal deficit present.     Mental Status: He is alert and oriented to person, place, and time. Mental status is at baseline.     Motor: No weakness.     Coordination: Coordination normal.     Gait: Gait abnormal.  Psychiatric:        Mood and Affect: Mood normal.        Behavior: Behavior normal.        Thought Content: Thought content normal.        Judgment: Judgment normal.    Labs reviewed: Recent Labs    11/09/23 1718 11/10/23 0751 11/11/23 0400 11/12/23 0430 11/13/23 0358 11/14/23 0414  NA  --  153*   < > 137 137 139  K  --  4.5   < > 3.9 4.4 4.4  CL  --  121*   < > 107 109 111  CO2  --  23   < > 21* 20* 22  GLUCOSE  --  136*   < > 109* 123* 107*  BUN  --  51*   < > 38* 40* 38*  CREATININE  --  2.31*   < > 2.08* 2.09* 2.12*  CALCIUM  --  8.4*   < > 7.5* 7.8* 8.2*  MG 2.5* 2.5*  --   --  2.1  --   PHOS 2.6 2.8  --   --  3.3  --    < > = values in this interval not displayed.   Recent Labs    11/12/23 0430 11/13/23 0358 11/14/23 0414  AST 60* 86* 64*  ALT 90* 113* 108*  ALKPHOS 268* 267* 238*  BILITOT 0.6 0.5 0.6  PROT 5.0* 5.4* 5.8*  ALBUMIN 2.1* 2.2* 2.2*   Recent Labs    11/04/23 1931 11/05/23 0537 11/07/23 0516 11/08/23 0535 11/12/23 0430 11/13/23 0358 11/14/23 0414  WBC 8.3   < > 13.1*   < > 11.1* 10.2 9.0  NEUTROABS 6.9  --  12.6*  --   --   --   --   HGB 14.1   < > 9.4*   < > 9.2* 9.0* 9.1*  HCT 42.7   < > 29.0*   < > 29.2* 29.4* 29.5*  MCV 85.6   < > 86.1   <  > 88.8 89.1 89.4  PLT 146*   < > 49*   < > 134* 166 193   < > = values in this interval not displayed.   No results found for: "TSH" No results found for: "HGBA1C" Lab Results  Component Value Date   TRIG 493 (H) 11/06/2023    Significant Diagnostic Results in last 30 days:  No results found.  Assessment/Plan  Anemia of chronic disease taking Iron, Hgb 9.1 11/14/23>>9.0, Iron 34, Vit B12 595 11/21/23  GERD (gastroesophageal reflux disease) Stable, taking Pantoprazole.   Recurrent depression (HCC) Stable, on Sertraline.   Chronic kidney disease, stage 3a (HCC) Bun/creat 40/2.35 11/21/23 at baseline.    Family/ staff Communication: ***  Labs/tests ordered:  ***

## 2023-12-18 NOTE — Assessment & Plan Note (Signed)
 Bun/creat 40/2.35 11/21/23 at baseline.

## 2023-12-19 DIAGNOSIS — R2681 Unsteadiness on feet: Secondary | ICD-10-CM | POA: Diagnosis not present

## 2023-12-20 DIAGNOSIS — R2681 Unsteadiness on feet: Secondary | ICD-10-CM | POA: Diagnosis not present

## 2023-12-22 DIAGNOSIS — R2681 Unsteadiness on feet: Secondary | ICD-10-CM | POA: Diagnosis not present

## 2023-12-23 ENCOUNTER — Encounter: Payer: Self-pay | Admitting: Nurse Practitioner

## 2023-12-23 ENCOUNTER — Non-Acute Institutional Stay (SKILLED_NURSING_FACILITY): Payer: Self-pay | Admitting: Nurse Practitioner

## 2023-12-23 DIAGNOSIS — Z936 Other artificial openings of urinary tract status: Secondary | ICD-10-CM | POA: Diagnosis not present

## 2023-12-23 DIAGNOSIS — D638 Anemia in other chronic diseases classified elsewhere: Secondary | ICD-10-CM | POA: Diagnosis not present

## 2023-12-23 DIAGNOSIS — F339 Major depressive disorder, recurrent, unspecified: Secondary | ICD-10-CM

## 2023-12-23 DIAGNOSIS — K219 Gastro-esophageal reflux disease without esophagitis: Secondary | ICD-10-CM

## 2023-12-23 DIAGNOSIS — N1831 Chronic kidney disease, stage 3a: Secondary | ICD-10-CM | POA: Diagnosis not present

## 2023-12-23 DIAGNOSIS — R2681 Unsteadiness on feet: Secondary | ICD-10-CM | POA: Diagnosis not present

## 2023-12-23 NOTE — Assessment & Plan Note (Addendum)
 Aggressive bladder CA requiring ileal conduit (followed by Urology - Dr. 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

## 2023-12-23 NOTE — Assessment & Plan Note (Signed)
 Bun/creat 40/2.35 11/21/23 at baseline.

## 2023-12-23 NOTE — Assessment & Plan Note (Signed)
 Stable, taking Pantoprazole

## 2023-12-23 NOTE — Assessment & Plan Note (Signed)
 taking Iron, Hgb 9.1 11/14/23>>9.0, Iron 34, Vit B12 595 11/21/23

## 2023-12-23 NOTE — Assessment & Plan Note (Signed)
 His mood is stable, on Sertraline.

## 2023-12-23 NOTE — Progress Notes (Addendum)
 Location:   [facility] Room Number: 31 Place of Service:  SNF (31)  Provider: Chipper Oman NP  PCP: Thana Ates, MD Patient Care Team: Thana Ates, MD as PCP - General (Internal Medicine)  Extended Emergency Contact Information Primary Emergency Contact: Sarafian,May Address: [address]          [place], [place] [address] Darden Amber of [place] Phone: [phone] Relation: Spouse Secondary Emergency Contact: [facility] Home Phone: [phone] Mobile Phone: [phone] Relation: Son  Code Status: DNR Goals of care:  Advanced Directive information    11/08/2023    8:00 AM  Advanced Directives  Does Patient Have a Medical Advance Directive? Unable to assess, patient is non-responsive or altered mental status     No Known Allergies  No chief complaint on file.   HPI:  83 y.o. male  with medical history significant of bladder cancer, s/p ileal conduit, chronic CKD, depression, anemia was admitted to [facility] for therapy following hospital stay  The patient has recovered from abd surgery, regained physical strength, and ADL function, he is stable to transfer to SNF [facility] for continue therapy and care.  The patient had flare up facial seborrheic dermatitis while in SNF [facility], treated with topical agents.  Hx of dandruff, using Ketoconazole shampoo use.              Hospitalized 11/04/23-11/14/23 for AKI, abd sepsis, SOB-followed by Dr. Carolynne Edouard general surgery-underwent exploratory laparotomy with LOA, SOR-wound vac-healed.              Hx of SBO (s/p ex-lap/SBR 02/2022 ). 11/05/23 closed -lop SBO with necrosis(resected) 2/2 tight band(nonfunctional L ureter)which was ligated             Aggressive bladder/prostate CA requiring ileal conduit (followed by Urology - Dr. 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              HLD  off statin               CKD Bun/creat 40/2.35 11/21/23 at baseline.              Anemia, taking Iron, Hgb 9.1 11/14/23>>9.0, Iron 34, Vit B12 595 11/21/23             GERD, taking Pantoprazole.              Depression, on Sertraline.       Past Medical History:  Diagnosis Date   Bladder cancer (HCC)    Chronic kidney disease    Dementia (HCC)    History of kidney stones    Medical history non-contributory    Meningitis    hx of as a child affected right eye    Past Surgical History:  Procedure Laterality Date   ciliodional cyst  yrs ago   COLONOSCOPY WITH PROPOFOL N/A 02/26/2017   Procedure: COLONOSCOPY WITH PROPOFOL;  Surgeon: Charolett Bumpers, MD;  [facility];  Service: Endoscopy;  Laterality: N/A;   colonscopy  2008   HERNIA REPAIR     LAPAROTOMY N/A 11/05/2023   Procedure: EXPLORATORY LAPAROTOMY; lysis of adhesions; small bowel resection; ligation of left ureter; primary hernia repair;  Surgeon: Griselda Miner, MD;  [facility];  Service: General;  Laterality: N/A;   LYMPH NODE DISSECTION Bilateral 09/22/2021   Procedure: LYMPH NODE DISSECTION;  Surgeon: Sebastian Ache, MD;  [facility];  Service: Urology;  Laterality: Bilateral;   NEPHROLITHOTOMY Right 03/30/2022   Procedure: NEPHROLITHOTOMY PERCUTANEOUS;  Surgeon: Osborn Blaze, MD;  [facility];  Service: Urology;  Laterality: Right;  2 HRS   NEPHROLITHOTOMY Right 03/31/2022   Procedure: RIGHT ANTEGRADE NEPHROGRAM; NEPHROSTOMY TUBE PLACEMENT;  Surgeon: Osborn Blaze, MD;  [facility];  Service: Urology;  Laterality: Right;   ROBOT ASSISTED LAPAROSCOPIC COMPLETE CYSTECT ILEAL CONDUIT N/A 09/22/2021   Procedure: XI ROBOTIC ASSISTED LAPAROSCOPIC COMPLETE CYSTECT ILEAL CONDUIT WITH INDOCYANINE GREEN DYE;  Surgeon: Osborn Blaze, MD;  [facility];  Service: Urology;  Laterality: N/A;  6 HRS   ROBOT ASSISTED LAPAROSCOPIC RADICAL PROSTATECTOMY N/A 09/22/2021   Procedure: XI ROBOTIC ASSISTED  LAPAROSCOPIC RADICAL PROSTATECTOMY;  Surgeon: Osborn Blaze, MD;  [facility];  Service: Urology;  Laterality: N/A;   TONSILLECTOMY  as child   TRANSURETHRAL RESECTION OF BLADDER TUMOR WITH MITOMYCIN-C N/A 05/02/2021   Procedure: TRANSURETHRAL RESECTION OF BLADDER TUMOR WITH GEMCITABINE;  Surgeon: Homero Luster, MD;  [facility];  Service: Urology;  Laterality: N/A;  GENERAL ANESTHESIA WITH PARALYSIS   UMBILICAL HERNIA REPAIR N/A 09/22/2021   Procedure: OPEN HERNIA REPAIR UMBILICAL ADULT;  Surgeon: Osborn Blaze, MD;  [facility];  Service: Urology;  Laterality: N/A;      reports that he has quit smoking. His smoking use included cigarettes. He has a 30 pack-year smoking history. He has never used smokeless tobacco. He reports that he does not currently use alcohol. He reports that he does not use drugs. Social History   Socioeconomic History   Marital status: Married    Spouse name: Not on file   Number of children: Not on file   Years of education: Not on file   Highest education level: Not on file  Occupational History   Not on file  Tobacco Use   Smoking status: Former    Current packs/day: 1.00    Average packs/day: 1 pack/day for 30.0 years (30.0 ttl pk-yrs)    Types: Cigarettes   Smokeless tobacco: Never  Vaping Use   Vaping status: Never Used  Substance and Sexual Activity   Alcohol use: Not Currently    Comment: occ   Drug use: No   Sexual activity: Not on file  Other Topics Concern   Not on file  Social History Narrative   Not on file   Social Drivers of Health   Financial Resource Strain: Not on file  Food Insecurity: Patient Unable To Answer (11/06/2023)   Hunger Vital Sign    Worried About Running Out of Food in the Last Year: Patient unable to answer    Ran Out of Food in the Last Year: Patient unable to answer  Transportation Needs: Patient Unable To Answer (11/06/2023)   PRAPARE - Transportation    Lack of Transportation (Medical): Patient  unable to answer    Lack of Transportation (Non-Medical): Patient unable to answer  Physical Activity: Not on file  Stress: Not on file  Social Connections: Patient Unable To Answer (11/06/2023)   Social Connection and Isolation Panel [NHANES]    Frequency of Communication with Friends and Family: Patient unable to answer    Frequency of Social Gatherings with Friends and Family: Patient unable to answer    Attends Religious Services: Patient unable to answer    Active Member of Clubs or Organizations: Patient unable to answer    Attends Banker Meetings: Patient unable to answer    Marital Status: Patient unable to  answer  Intimate Partner Violence: Patient Unable To Answer (11/06/2023)   Humiliation, Afraid, Rape, and Kick questionnaire    Fear of Current or Ex-Partner: Patient unable to answer    Emotionally Abused: Patient unable to answer    Physically Abused: Patient unable to answer    Sexually Abused: Patient unable to answer   Functional Status Survey:    No Known Allergies  Pertinent  Health Maintenance Due  Topic Date Due   INFLUENZA VACCINE  04/10/2024    Medications: Allergies as of 12/23/2023   No Known Allergies      Medication List        Accurate as of December 23, 2023  4:04 PM. If you have any questions, ask your nurse or doctor.          PAUSE taking these medications    atorvastatin 10 MG tablet Wait to take this until your doctor or other care provider tells you to start again. Commonly known as: LIPITOR Take 5 mg by mouth in the morning.       TAKE these medications    ketoconazole 2 % shampoo Commonly known as: NIZORAL Apply 1 application  topically as needed for irritation.   lidocaine 5 % Commonly known as: LIDODERM Place 1 patch onto the skin daily. Remove & Discard patch within 12 hours or as directed by MD   MULTIVITAMIN GUMMIES ADULT PO Take 1 tablet by mouth in the morning.   oxyCODONE 5 MG immediate release  tablet Commonly known as: Roxicodone Take 1 tablet (5 mg total) by mouth every 8 (eight) hours as needed for moderate pain (pain score 4-6).   pantoprazole 40 MG tablet Commonly known as: PROTONIX Take 1 tablet (40 mg total) by mouth daily.   sertraline 50 MG tablet Commonly known as: ZOLOFT Take 50 mg by mouth in the morning.        Review of Systems  Constitutional:  Negative for appetite change, fatigue and fever.  HENT:  Negative for congestion and trouble swallowing.   Eyes:  Negative for visual disturbance.  Respiratory:  Negative for cough and wheezing.   Cardiovascular:  Negative for palpitations and leg swelling.  Gastrointestinal:  Negative for abdominal pain and constipation.  Genitourinary:        Ileal conduit.   Musculoskeletal:  Positive for gait problem.  Skin:  Negative for rash and wound.  Neurological:  Negative for tremors and headaches.  Psychiatric/Behavioral:  Negative for confusion, dysphoric mood and sleep disturbance. The patient is not nervous/anxious.     Vitals:   12/23/23 1356  BP: 119/69  Pulse: 72  Resp: 18  Temp: 98.1 F (36.7 C)  SpO2: 96%  Weight: 176 lb 1.6 oz (79.9 kg)   Body mass index is 24.56 kg/m. Physical Exam Vitals and nursing note reviewed.  Constitutional:      Appearance: Normal appearance.  HENT:     Head: Normocephalic and atraumatic.     Nose: Nose normal.     Mouth/Throat:     Mouth: Mucous membranes are moist.  Eyes:     Extraocular Movements: Extraocular movements intact.     Conjunctiva/sclera: Conjunctivae normal.     Pupils: Pupils are equal, round, and reactive to light.  Cardiovascular:     Rate and Rhythm: Normal rate and regular rhythm.     Heart sounds: No murmur heard. Pulmonary:     Effort: Pulmonary effort is normal.     Breath sounds: No rales.  Abdominal:  General: Bowel sounds are normal.     Palpations: Abdomen is soft.     Tenderness: There is no abdominal tenderness.   Musculoskeletal:        General: No tenderness. Normal range of motion.     Cervical back: Normal range of motion and neck supple.     Right lower leg: No edema.     Left lower leg: No edema.  Skin:    General: Skin is warm and dry.     Comments: Left abd ileal conduit, mid lower abd wound vac/incision healed.   Neurological:     General: No focal deficit present.     Mental Status: He is alert and oriented to person, place, and time. Mental status is at baseline.     Motor: No weakness.     Coordination: Coordination normal.     Gait: Gait abnormal.  Psychiatric:        Mood and Affect: Mood normal.        Behavior: Behavior normal.        Thought Content: Thought content normal.        Judgment: Judgment normal.     Labs reviewed: Basic Metabolic Panel: Recent Labs    11/09/23 1718 11/10/23 0751 11/11/23 0400 11/12/23 0430 11/13/23 0358 11/14/23 0414  NA  --  153*   < > 137 137 139  K  --  4.5   < > 3.9 4.4 4.4  CL  --  121*   < > 107 109 111  CO2  --  23   < > 21* 20* 22  GLUCOSE  --  136*   < > 109* 123* 107*  BUN  --  51*   < > 38* 40* 38*  CREATININE  --  2.31*   < > 2.08* 2.09* 2.12*  CALCIUM  --  8.4*   < > 7.5* 7.8* 8.2*  MG 2.5* 2.5*  --   --  2.1  --   PHOS 2.6 2.8  --   --  3.3  --    < > = values in this interval not displayed.   Liver Function Tests: Recent Labs    11/12/23 0430 11/13/23 0358 11/14/23 0414  AST 60* 86* 64*  ALT 90* 113* 108*  ALKPHOS 268* 267* 238*  BILITOT 0.6 0.5 0.6  PROT 5.0* 5.4* 5.8*  ALBUMIN 2.1* 2.2* 2.2*   No results for input(s): "LIPASE", "AMYLASE" in the last 8760 hours. No results for input(s): "AMMONIA" in the last 8760 hours. CBC: Recent Labs    11/04/23 1931 11/05/23 0537 11/07/23 0516 11/08/23 0535 11/12/23 0430 11/13/23 0358 11/14/23 0414  WBC 8.3   < > 13.1*   < > 11.1* 10.2 9.0  NEUTROABS 6.9  --  12.6*  --   --   --   --   HGB 14.1   < > 9.4*   < > 9.2* 9.0* 9.1*  HCT 42.7   < > 29.0*   < >  29.2* 29.4* 29.5*  MCV 85.6   < > 86.1   < > 88.8 89.1 89.4  PLT 146*   < > 49*   < > 134* 166 193   < > = values in this interval not displayed.   Cardiac Enzymes: No results for input(s): "CKTOTAL", "CKMB", "CKMBINDEX", "TROPONINI" in the last 8760 hours. BNP: Invalid input(s): "POCBNP" CBG: Recent Labs    11/11/23 2340 11/12/23 0429 11/12/23 0733  GLUCAP 75 103* 77  Procedures and Imaging Studies During Stay: No results found.  Assessment/Plan:   Chronic kidney disease, stage 3a (HCC) Bun/creat 40/2.35 11/21/23 at baseline.   Anemia of chronic disease  taking Iron, Hgb 9.1 11/14/23>>9.0, Iron 34, Vit B12 595 11/21/23  GERD (gastroesophageal reflux disease) Stable, taking Pantoprazole.   Recurrent depression (HCC) His mood is stable, on Sertraline.   S/P ileal conduit (HCC) Aggressive bladder CA requiring ileal conduit (followed by Urology - Dr. 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    Patient is being discharged with the following home health services:    Patient is being discharged with the following durable medical equipment:    Patient has been advised to f/u with their PCP in 1-2 weeks to bring them up to date on their rehab stay.  Social services at facility was responsible for arranging this appointment.  Pt was provided with a 30 day supply of prescriptions for medications and refills must be obtained from their PCP.  For controlled substances, a more limited supply may be provided adequate until PCP appointment only.  Future labs/tests needed:  prn

## 2023-12-24 DIAGNOSIS — R2681 Unsteadiness on feet: Secondary | ICD-10-CM | POA: Diagnosis not present

## 2023-12-26 ENCOUNTER — Non-Acute Institutional Stay (SKILLED_NURSING_FACILITY): Payer: Self-pay | Admitting: Internal Medicine

## 2023-12-26 DIAGNOSIS — F339 Major depressive disorder, recurrent, unspecified: Secondary | ICD-10-CM

## 2023-12-26 DIAGNOSIS — N1831 Chronic kidney disease, stage 3a: Secondary | ICD-10-CM | POA: Diagnosis not present

## 2023-12-26 DIAGNOSIS — Z936 Other artificial openings of urinary tract status: Secondary | ICD-10-CM

## 2023-12-26 DIAGNOSIS — E785 Hyperlipidemia, unspecified: Secondary | ICD-10-CM

## 2023-12-26 DIAGNOSIS — M6281 Muscle weakness (generalized): Secondary | ICD-10-CM | POA: Diagnosis not present

## 2023-12-26 DIAGNOSIS — D638 Anemia in other chronic diseases classified elsewhere: Secondary | ICD-10-CM

## 2023-12-26 DIAGNOSIS — Z9889 Other specified postprocedural states: Secondary | ICD-10-CM

## 2023-12-26 DIAGNOSIS — K219 Gastro-esophageal reflux disease without esophagitis: Secondary | ICD-10-CM | POA: Diagnosis not present

## 2023-12-26 DIAGNOSIS — R262 Difficulty in walking, not elsewhere classified: Secondary | ICD-10-CM | POA: Diagnosis not present

## 2023-12-26 MED ORDER — IRON (FERROUS SULFATE) 325 (65 FE) MG PO TABS: 325.0000 mg | ORAL_TABLET | ORAL | Status: AC

## 2023-12-26 NOTE — Progress Notes (Addendum)
 Provider:   Location:  [facility] curator of Service:  SNF ([id number])  PCP: Tena Feeling, MD Patient Care Team: Tena Feeling, MD as PCP - General (Internal Medicine)  Extended Emergency Contact Information Primary Emergency Contact: [facility] Address: [address]          [place], [place] [address] [place] Mobile Phone: [phone] Relation: Spouse Secondary Emergency Contact: [facility] Phone: [phone] Mobile Phone: [phone] Relation: Son  Code Status: Full Code Goals of Care: Advanced Directive information    11/08/2023    8:00 AM  Advanced Directives  Does Patient Have a Medical Advance Directive? Unable to assess, patient is non-responsive or altered mental status      Chief Complaint  Patient presents with   Care Management    HPI: Patient is a 83 y.o. male seen today for admission to  [facility]  Patient has h/o H/o Cystectomy and has Urostomy for Bladder cancer, h/o CKD 4 and HLD  Admitted in the hospital from 02/24-03/06 for SBO with necrosis needing Laparotomy and Left Urethral Ligation  Post op Complicated with Encephalopathy Sepsis and Wound infection  Eventually wa discharged to [facility] for Rehab And now he is in [place] to be close to his wife   Patient is doing well. Wound is almost closed He is walking with his walker Able to do his transfers His Goal is to move to his apartment His wife is also here for Rehab     Past Medical History:  Diagnosis Date   Bladder cancer (HCC)    Chronic kidney disease    Dementia (HCC)    History of kidney stones    Medical history non-contributory    Meningitis    hx of as a child affected right eye   Past Surgical History:  Procedure Laterality Date   ciliodional cyst  yrs ago   COLONOSCOPY WITH PROPOFOL  N/A 02/26/2017   Procedure: COLONOSCOPY WITH PROPOFOL ;  Surgeon: Garrett Kallman, MD;  Location: [facility];  Service: Endoscopy;  Laterality: N/A;    colonscopy  2008   HERNIA REPAIR     LAPAROTOMY N/A 11/05/2023   Procedure: EXPLORATORY LAPAROTOMY; lysis of adhesions; small bowel resection; ligation of left ureter; primary hernia repair;  Surgeon: Caralyn Chandler, MD;  Location: [facility];  Service: General;  Laterality: N/A;   LYMPH NODE DISSECTION Bilateral 09/22/2021   Procedure: LYMPH NODE DISSECTION;  Surgeon: Osborn Blaze, MD;  Location: [facility];  Service: Urology;  Laterality: Bilateral;   NEPHROLITHOTOMY Right 03/30/2022   Procedure: NEPHROLITHOTOMY PERCUTANEOUS;  Surgeon: Osborn Blaze, MD;  Location: [facility];  Service: Urology;  Laterality: Right;  2 HRS   NEPHROLITHOTOMY Right 03/31/2022   Procedure: RIGHT ANTEGRADE NEPHROGRAM; NEPHROSTOMY TUBE PLACEMENT;  Surgeon: Osborn Blaze, MD;  Location: [facility];  Service: Urology;  Laterality: Right;   ROBOT ASSISTED LAPAROSCOPIC COMPLETE CYSTECT ILEAL CONDUIT N/A 09/22/2021   Procedure: XI ROBOTIC ASSISTED LAPAROSCOPIC COMPLETE CYSTECT ILEAL CONDUIT WITH INDOCYANINE GREEN DYE;  Surgeon: Osborn Blaze, MD;  Location: [facility];  Service: Urology;  Laterality: N/A;  6 HRS   ROBOT ASSISTED LAPAROSCOPIC RADICAL PROSTATECTOMY N/A 09/22/2021   Procedure: XI ROBOTIC ASSISTED LAPAROSCOPIC RADICAL PROSTATECTOMY;  Surgeon: Osborn Blaze, MD;  Location: [facility];  Service: Urology;  Laterality: N/A;   TONSILLECTOMY  as child   TRANSURETHRAL RESECTION OF BLADDER TUMOR WITH MITOMYCIN -C N/A 05/02/2021   Procedure: TRANSURETHRAL RESECTION OF BLADDER TUMOR WITH GEMCITABINE ;  Surgeon: Homero Luster, MD;  Location: [facility];  Service: Urology;  Laterality: N/A;  GENERAL ANESTHESIA WITH PARALYSIS   UMBILICAL HERNIA REPAIR N/A 09/22/2021   Procedure: OPEN HERNIA REPAIR UMBILICAL ADULT;  Surgeon: Osborn Blaze, MD;  Location: [facility];  Service: Urology;  Laterality: N/A;    reports that he has quit smoking. His smoking use included cigarettes. He has a 30 pack-year smoking history. He has never used smokeless tobacco. He  reports that he does not currently use alcohol. He reports that he does not use drugs. Social History   Socioeconomic History   Marital status: Married    Spouse name: Not on file   Number of children: Not on file   Years of education: Not on file   Highest education level: Not on file  Occupational History   Not on file  Tobacco Use   Smoking status: Former    Current packs/day: 1.00    Average packs/day: 1 pack/day for 30.0 years (30.0 ttl pk-yrs)    Types: Cigarettes   Smokeless tobacco: Never  Vaping Use   Vaping status: Never Used  Substance and Sexual Activity   Alcohol use: Not Currently    Comment: occ   Drug use: No   Sexual activity: Not on file  Other Topics Concern   Not on file  Social History Narrative   Not on file   Social Drivers of Health   Financial Resource Strain: Not on file  Food Insecurity: Patient Unable To Answer (11/06/2023)   Hunger Vital Sign    Worried About Running Out of Food in the Last Year: Patient unable to answer    Ran Out of Food in the Last Year: Patient unable to answer  Transportation Needs: Patient Unable To Answer (11/06/2023)   PRAPARE - Transportation    Lack of Transportation (Medical): Patient unable to answer    Lack of Transportation (Non-Medical): Patient unable to answer  Physical Activity: Not on file  Stress: Not on file  Social Connections: Patient Unable To Answer (11/06/2023)   Social Connection and Isolation Panel [NHANES]    Frequency of Communication with Friends and Family: Patient unable to answer    Frequency of Social Gatherings with Friends and Family: Patient unable to answer    Attends Religious Services: Patient unable to answer    Active Member of Clubs or Organizations: Patient unable to answer    Attends Banker Meetings: Patient unable to answer    Marital Status: Patient unable to answer  Intimate Partner Violence: Patient Unable To Answer (11/06/2023)   Humiliation, Afraid, Rape, and  Kick questionnaire    Fear of Current or Ex-Partner: Patient unable to answer    Emotionally Abused: Patient unable to answer    Physically Abused: Patient unable to answer    Sexually Abused: Patient unable to answer    Functional Status Survey:    No family history on file.  Health Maintenance  Topic Date Due   DTaP/Tdap/Td (1 - Tdap) Never done   Pneumonia Vaccine 81+ Years old (1 of 2 - PCV) Never done   Lung Cancer Screening  Never done   Medicare Annual Wellness (AWV)  01/01/2024   COVID-19 Vaccine (7 - Pfizer risk 2024-25 season) 01/01/2024   INFLUENZA VACCINE  04/10/2024   Zoster Vaccines- Shingrix  Completed   HPV VACCINES  Aged Out   Meningococcal B Vaccine  Aged Out    No Known Allergies  Outpatient Encounter Medications as of 12/26/2023  Medication Sig   [Paused]  atorvastatin  (LIPITOR) 10 MG tablet Take 5 mg by mouth in the morning.   ketoconazole (NIZORAL) 2 % shampoo Apply 1 application  topically as needed for irritation.   lidocaine  (LIDODERM ) 5 % Place 1 patch onto the skin daily. Remove & Discard patch within 12 hours or as directed by MD   Multiple Vitamins-Minerals (MULTIVITAMIN GUMMIES ADULT PO) Take 1 tablet by mouth in the morning.   oxyCODONE  (ROXICODONE ) 5 MG immediate release tablet Take 1 tablet (5 mg total) by mouth every 8 (eight) hours as needed for moderate pain (pain score 4-6).   pantoprazole  (PROTONIX ) 40 MG tablet Take 1 tablet (40 mg total) by mouth daily.   sertraline  (ZOLOFT ) 50 MG tablet Take 50 mg by mouth in the morning.   No facility-administered encounter medications on file as of 12/26/2023.    Review of Systems  Constitutional:  Negative for activity change, appetite change and unexpected weight change.  HENT: Negative.    Respiratory:  Negative for cough and shortness of breath.   Cardiovascular:  Negative for leg swelling.  Gastrointestinal:  Negative for constipation.  Genitourinary:  Negative for frequency.   Musculoskeletal:  Positive for gait problem. Negative for arthralgias and myalgias.  Skin: Negative.  Negative for rash.  Neurological:  Positive for weakness. Negative for dizziness.  Psychiatric/Behavioral:  Negative for confusion and sleep disturbance.   All other systems reviewed and are negative.   There were no vitals filed for this visit. There is no height or weight on file to calculate BMI. Physical Exam Vitals reviewed.  Constitutional:      Appearance: Normal appearance.  HENT:     Head: Normocephalic.     Nose: Nose normal.     Mouth/Throat:     Mouth: Mucous membranes are moist.     Pharynx: Oropharynx is clear.  Eyes:     Pupils: Pupils are equal, round, and reactive to light.  Cardiovascular:     Rate and Rhythm: Normal rate and regular rhythm.     Pulses: Normal pulses.     Heart sounds: No murmur heard. Pulmonary:     Effort: Pulmonary effort is normal. No respiratory distress.     Breath sounds: Normal breath sounds. No rales.  Abdominal:     General: Abdomen is flat. Bowel sounds are normal.     Palpations: Abdomen is soft.     Comments: Urostomy Midline Surgical Incision completely Healed  Musculoskeletal:        General: No swelling.     Cervical back: Neck supple.  Skin:    General: Skin is warm.  Neurological:     General: No focal deficit present.     Mental Status: He is alert and oriented to person, place, and time.  Psychiatric:        Mood and Affect: Mood normal.        Thought Content: Thought content normal.     Labs reviewed: Basic Metabolic Panel: Recent Labs    11/09/23 1718 11/10/23 0751 11/11/23 0400 11/12/23 0430 11/13/23 0358 11/14/23 0414  NA  --  153*   < > 137 137 139  K  --  4.5   < > 3.9 4.4 4.4  CL  --  121*   < > 107 109 111  CO2  --  23   < > 21* 20* 22  GLUCOSE  --  136*   < > 109* 123* 107*  BUN  --  51*   < > 38*  40* 38*  CREATININE  --  2.31*   < > 2.08* 2.09* 2.12*  CALCIUM   --  8.4*   < > 7.5* 7.8*  8.2*  MG 2.5* 2.5*  --   --  2.1  --   PHOS 2.6 2.8  --   --  3.3  --    < > = values in this interval not displayed.   Liver Function Tests: Recent Labs    11/12/23 0430 11/13/23 0358 11/14/23 0414  AST 60* 86* 64*  ALT 90* 113* 108*  ALKPHOS 268* 267* 238*  BILITOT 0.6 0.5 0.6  PROT 5.0* 5.4* 5.8*  ALBUMIN  2.1* 2.2* 2.2*   No results for input(s): "LIPASE", "AMYLASE" in the last 8760 hours. No results for input(s): "AMMONIA" in the last 8760 hours. CBC: Recent Labs    11/04/23 1931 11/05/23 0537 11/07/23 0516 11/08/23 0535 11/12/23 0430 11/13/23 0358 11/14/23 0414  WBC 8.3   < > 13.1*   < > 11.1* 10.2 9.0  NEUTROABS 6.9  --  12.6*  --   --   --   --   HGB 14.1   < > 9.4*   < > 9.2* 9.0* 9.1*  HCT 42.7   < > 29.0*   < > 29.2* 29.4* 29.5*  MCV 85.6   < > 86.1   < > 88.8 89.1 89.4  PLT 146*   < > 49*   < > 134* 166 193   < > = values in this interval not displayed.   Cardiac Enzymes: No results for input(s): "CKTOTAL", "CKMB", "CKMBINDEX", "TROPONINI" in the last 8760 hours. BNP: Invalid input(s): "POCBNP" No results found for: "HGBA1C" No results found for: "TSH" No results found for: "VITAMINB12" No results found for: "FOLATE" Lab Results  Component Value Date   IRON  13 (L) 11/27/2021   TIBC 192 (L) 11/27/2021    Imaging and Procedures obtained prior to SNF admission: DG Chest Port 1 View Result Date: 11/05/2023 CLINICAL DATA:  Line placement EXAM: PORTABLE CHEST 1 VIEW COMPARISON:  11/05/2023 earlier FINDINGS: Stable ET tube and enteric tube. Left IJ catheter in place with tip along the central SVC above the right atrium. No pneumothorax. Stable enlarged cardiopericardial silhouette with some prominence of central vasculature. Persistent right lung base opacity with small right effusion. Dense right upper lung nodule consistent with old granulomatous disease. IMPRESSION: New left IJ catheter.  No pneumothorax. Electronically Signed   By: Adrianna Horde M.D.   On:  11/05/2023 17:58   Portable Chest x-ray Result Date: 11/05/2023 CLINICAL DATA:  Intubation. EXAM: PORTABLE CHEST 1 VIEW COMPARISON:  Radiographs 11/04/2023 and 08/13/2023. FINDINGS: 1619 hours. Interval intubation. Tip of the endotracheal tube overlies the mid trachea. Enteric tube projects below the diaphragm, tip not visualized. The heart size and mediastinal contours are stable allowing for supine technique. Mildly increased opacities at both lung bases, greater on the right. No evidence of pneumothorax or significant pleural effusion. Unchanged calcified right upper lobe granuloma. No acute osseous findings. IMPRESSION: 1. Satisfactory position of support system. 2. Mildly increased bibasilar opacities, greater on the right, which could reflect atelectasis or pneumonia. Electronically Signed   By: Elmon Hagedorn M.D.   On: 11/05/2023 17:09   DG Abd Portable 1V-Small Bowel Obstruction Protocol-initial, 8 hr delay Result Date: 11/05/2023 CLINICAL DATA:  Small-bowel obstruction.  8 hour delayed film EXAM: PORTABLE ABDOMEN - 1 VIEW COMPARISON:  11/05/2023 at 1 a.m. FINDINGS: Abdominal x-ray obtained 8 hours after administration of small  amount of oral contrast demonstrates a small amount of contrast in the fundus of the stomach. Enteric tube extending to the right of the spine, likely along the distal stomach. There is a right lower quadrant ostomy. The large bowel has scattered stool and is nondilated. No colonic contrast. There are several distended loops of small bowel in the mid abdomen with central loop measuring up to 3.3 cm in diameter. No contrast in the small bowel. Curvature and moderate degenerative changes of the spine. IMPRESSION: Small amount of contrast overlying the fundus of the stomach. No significant other areas of small large bowel contrast. Persistent dilated loops of small bowel in the mid abdomen. Right-sided ostomy.  Diffuse scattered colonic stool. NG tube Electronically Signed   By:  Adrianna Horde M.D.   On: 11/05/2023 13:36   DG Abd Portable 1V-Small Bowel Protocol-Position Verification Result Date: 11/05/2023 CLINICAL DATA:  NG tube placement EXAM: PORTABLE ABDOMEN - 1 VIEW COMPARISON:  CT today. FINDINGS: NG tube tip is in the stomach with the side port near the GE junction. IMPRESSION: NG tube in the proximal stomach. Electronically Signed   By: Janeece Mechanic M.D.   On: 11/05/2023 01:37   CT ABDOMEN PELVIS WO CONTRAST Result Date: 11/04/2023 CLINICAL DATA:  Acute nonlocalized abdominal pain. Worsening right upper quadrant abdominal pain tonight. Urostomy not draining. EXAM: CT ABDOMEN AND PELVIS WITHOUT CONTRAST TECHNIQUE: Multidetector CT imaging of the abdomen and pelvis was performed following the standard protocol without IV contrast. RADIATION DOSE REDUCTION: This exam was performed according to the departmental dose-optimization program which includes automated exposure control, adjustment of the mA and/or kV according to patient size and/or use of iterative reconstruction technique. COMPARISON:  08/15/2023 FINDINGS: Lower chest: Mild atelectasis in the lung bases. Small esophageal hiatal hernia. Hepatobiliary: Minimal free fluid along the anterior liver edge. No focal liver lesions. Gallbladder and bile ducts are unremarkable. Pancreas: Unremarkable. No pancreatic ductal dilatation or surrounding inflammatory changes. Spleen: Calcified granulomas in the spleen.  Normal-size. Adrenals/Urinary Tract: No adrenal gland nodules. Diffuse parenchymal atrophy involving the left kidney. Bilateral hydronephrosis and hydroureter. A right lower quadrant urinary conduit is present. The urinary conduit is decompressed. Bladder is surgically absent. Stomach/Bowel: Stomach is fluid-filled without abnormal distention. Proximal small bowel are decompressed. Mid abdominal and right lower quadrant small bowel loops are dilated and fluid-filled with thickened wall. There appears to be tethering in the  right lower quadrant likely indicating an area of adhesions. This is near the apex of the ileal conduit. Scattered stool throughout the colon. There is a ventral abdominal wall hernia at the umbilicus containing fat, cecum, and terminal ileum. There is no obvious obstruction at this level. Multiple areas of surgical anastomosis are demonstrated in small bowel. Vascular/Lymphatic: Aortic atherosclerosis. No enlarged abdominal or pelvic lymph nodes. Reproductive: Prostate gland appears to be surgically absent. Other: Small amount of free fluid in the mesentery surrounding dilated bowel loops. No free air. Musculoskeletal: Degenerative changes in the spine. No acute bony abnormalities. IMPRESSION: 1. Right lower quadrant ileal conduit. Ileal conduit is decompressed and there is bilateral hydronephrosis and hydroureter. Left renal atrophy. 2. Dilated mid to lower abdominal small bowel with small bowel wall thickening in this area and mesenteric fluid. No pneumatosis. 3. Changes are likely to represent small bowel obstruction with possible vascular compromise causing small-bowel wall thickening. Level of obstruction appears to be in the right lower quadrant, likely adhesions. Obstruction may also be involving the proximal ileal conduit. 4. Aortic atherosclerosis. These results  were called by telephone at the time of interpretation on 11/04/2023 at 9:41 pm to provider [facility] , who verbally acknowledged these results. Electronically Signed   By: Boyce Byes M.D.   On: 11/04/2023 21:45   DG Chest Portable 1 View Result Date: 11/04/2023 CLINICAL DATA:  Worsening right upper quadrant pain EXAM: PORTABLE CHEST 1 VIEW COMPARISON:  08/13/2023 FINDINGS: Single frontal view of the chest demonstrates an unremarkable cardiac silhouette. No airspace disease, effusion, or pneumothorax. No acute bony abnormalities. IMPRESSION: 1. No acute intrathoracic process. Electronically Signed   By: Bobbye Burrow M.D.   On:  11/04/2023 21:10    Assessment/Plan 1. Chronic kidney disease, stage 3a (HCC) (Primary) Creat 2.35 on 03/13 Which is his Baseline Will Need Follow up with Nephrology as Out Patient  2. Anemia of chronic disease Iron  Stores in [place] blood work were 14% with Low ferritin On Iron  Repeat CBC   3. S/P ileal conduit (HCC)with Urostomy   4. S/P exploratory laparotomy with lysis of adhesions; small bowel resection  Surgical Wound has healed He has Follow up with Surgery Denies any issues  5. 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 Gastroesophageal reflux disease, unspecified whether esophagitis present Protonix   7. Hyperlipidemia, unspecified hyperlipidemia type Statin on hold due to ? LFTS were high Will keep them on hold till repeat Labs All his  Labs were normal in [employer] staff Communication:   Labs/tests ordered:CBC,CMP

## 2023-12-27 ENCOUNTER — Encounter: Payer: Self-pay | Admitting: Internal Medicine

## 2023-12-27 DIAGNOSIS — R262 Difficulty in walking, not elsewhere classified: Secondary | ICD-10-CM | POA: Diagnosis not present

## 2023-12-27 DIAGNOSIS — Z905 Acquired absence of kidney: Secondary | ICD-10-CM | POA: Diagnosis not present

## 2023-12-27 DIAGNOSIS — M6281 Muscle weakness (generalized): Secondary | ICD-10-CM | POA: Diagnosis not present

## 2023-12-30 DIAGNOSIS — R262 Difficulty in walking, not elsewhere classified: Secondary | ICD-10-CM | POA: Diagnosis not present

## 2023-12-30 DIAGNOSIS — M6281 Muscle weakness (generalized): Secondary | ICD-10-CM | POA: Diagnosis not present

## 2023-12-30 DIAGNOSIS — D649 Anemia, unspecified: Secondary | ICD-10-CM | POA: Diagnosis not present

## 2023-12-31 DIAGNOSIS — Z905 Acquired absence of kidney: Secondary | ICD-10-CM | POA: Diagnosis not present

## 2023-12-31 DIAGNOSIS — M6281 Muscle weakness (generalized): Secondary | ICD-10-CM | POA: Diagnosis not present

## 2024-01-02 DIAGNOSIS — R262 Difficulty in walking, not elsewhere classified: Secondary | ICD-10-CM | POA: Diagnosis not present

## 2024-01-02 DIAGNOSIS — Z905 Acquired absence of kidney: Secondary | ICD-10-CM | POA: Diagnosis not present

## 2024-01-02 DIAGNOSIS — M6281 Muscle weakness (generalized): Secondary | ICD-10-CM | POA: Diagnosis not present

## 2024-01-05 DIAGNOSIS — M6281 Muscle weakness (generalized): Secondary | ICD-10-CM | POA: Diagnosis not present

## 2024-01-05 DIAGNOSIS — Z905 Acquired absence of kidney: Secondary | ICD-10-CM | POA: Diagnosis not present

## 2024-01-08 DIAGNOSIS — M6281 Muscle weakness (generalized): Secondary | ICD-10-CM | POA: Diagnosis not present

## 2024-01-08 DIAGNOSIS — R262 Difficulty in walking, not elsewhere classified: Secondary | ICD-10-CM | POA: Diagnosis not present

## 2024-01-08 DIAGNOSIS — Z905 Acquired absence of kidney: Secondary | ICD-10-CM | POA: Diagnosis not present

## 2024-01-09 DIAGNOSIS — M6281 Muscle weakness (generalized): Secondary | ICD-10-CM | POA: Diagnosis not present

## 2024-01-09 DIAGNOSIS — R262 Difficulty in walking, not elsewhere classified: Secondary | ICD-10-CM | POA: Diagnosis not present

## 2024-01-13 DIAGNOSIS — M6281 Muscle weakness (generalized): Secondary | ICD-10-CM | POA: Diagnosis not present

## 2024-01-13 DIAGNOSIS — Z905 Acquired absence of kidney: Secondary | ICD-10-CM | POA: Diagnosis not present

## 2024-01-15 DIAGNOSIS — M6281 Muscle weakness (generalized): Secondary | ICD-10-CM | POA: Diagnosis not present

## 2024-01-15 DIAGNOSIS — R262 Difficulty in walking, not elsewhere classified: Secondary | ICD-10-CM | POA: Diagnosis not present

## 2024-01-16 DIAGNOSIS — Z905 Acquired absence of kidney: Secondary | ICD-10-CM | POA: Diagnosis not present

## 2024-01-16 DIAGNOSIS — M6281 Muscle weakness (generalized): Secondary | ICD-10-CM | POA: Diagnosis not present

## 2024-01-17 DIAGNOSIS — M6281 Muscle weakness (generalized): Secondary | ICD-10-CM | POA: Diagnosis not present

## 2024-01-17 DIAGNOSIS — R262 Difficulty in walking, not elsewhere classified: Secondary | ICD-10-CM | POA: Diagnosis not present

## 2024-01-20 DIAGNOSIS — M6281 Muscle weakness (generalized): Secondary | ICD-10-CM | POA: Diagnosis not present

## 2024-01-20 DIAGNOSIS — Z905 Acquired absence of kidney: Secondary | ICD-10-CM | POA: Diagnosis not present

## 2024-01-21 DIAGNOSIS — M6281 Muscle weakness (generalized): Secondary | ICD-10-CM | POA: Diagnosis not present

## 2024-01-21 DIAGNOSIS — R262 Difficulty in walking, not elsewhere classified: Secondary | ICD-10-CM | POA: Diagnosis not present

## 2024-01-23 DIAGNOSIS — M6281 Muscle weakness (generalized): Secondary | ICD-10-CM | POA: Diagnosis not present

## 2024-01-23 DIAGNOSIS — Z905 Acquired absence of kidney: Secondary | ICD-10-CM | POA: Diagnosis not present

## 2024-01-23 DIAGNOSIS — R262 Difficulty in walking, not elsewhere classified: Secondary | ICD-10-CM | POA: Diagnosis not present

## 2024-01-27 DIAGNOSIS — M6281 Muscle weakness (generalized): Secondary | ICD-10-CM | POA: Diagnosis not present

## 2024-01-27 DIAGNOSIS — Z905 Acquired absence of kidney: Secondary | ICD-10-CM | POA: Diagnosis not present

## 2024-01-28 DIAGNOSIS — M6281 Muscle weakness (generalized): Secondary | ICD-10-CM | POA: Diagnosis not present

## 2024-01-28 DIAGNOSIS — R262 Difficulty in walking, not elsewhere classified: Secondary | ICD-10-CM | POA: Diagnosis not present

## 2024-01-29 ENCOUNTER — Non-Acute Institutional Stay (SKILLED_NURSING_FACILITY): Admitting: Orthopedic Surgery

## 2024-01-29 ENCOUNTER — Encounter: Payer: Self-pay | Admitting: Orthopedic Surgery

## 2024-01-29 DIAGNOSIS — Z936 Other artificial openings of urinary tract status: Secondary | ICD-10-CM

## 2024-01-29 DIAGNOSIS — Z9889 Other specified postprocedural states: Secondary | ICD-10-CM | POA: Diagnosis not present

## 2024-01-29 DIAGNOSIS — E785 Hyperlipidemia, unspecified: Secondary | ICD-10-CM | POA: Diagnosis not present

## 2024-01-29 DIAGNOSIS — N184 Chronic kidney disease, stage 4 (severe): Secondary | ICD-10-CM

## 2024-01-29 DIAGNOSIS — F339 Major depressive disorder, recurrent, unspecified: Secondary | ICD-10-CM

## 2024-01-29 DIAGNOSIS — K219 Gastro-esophageal reflux disease without esophagitis: Secondary | ICD-10-CM | POA: Diagnosis not present

## 2024-01-29 DIAGNOSIS — D638 Anemia in other chronic diseases classified elsewhere: Secondary | ICD-10-CM | POA: Diagnosis not present

## 2024-01-29 DIAGNOSIS — M6281 Muscle weakness (generalized): Secondary | ICD-10-CM | POA: Diagnosis not present

## 2024-01-29 DIAGNOSIS — Z905 Acquired absence of kidney: Secondary | ICD-10-CM | POA: Diagnosis not present

## 2024-01-29 NOTE — Progress Notes (Signed)
 Location:  [facility] Room Number: N34-A Place of Service:  SNF (31) Provider:  Nannette Babe, MD  Patient Care Team: Tena Feeling, MD as PCP - General (Internal Medicine)  Extended Emergency Contact Information Primary Emergency Contact: [facility] Address: [address]          [place], [place] [address] [place] Mobile Phone: [phone] Relation: Spouse Secondary Emergency Contact: [facility] Phone: [phone] Mobile Phone: [phone] Relation: Son  Code Status:  FULL Goals of care: Advanced Directive information    01/29/2024   10:51 AM  Advanced Directives  Does Patient Have a Medical Advance Directive? No  Would patient like information on creating a medical advance directive? No - Patient declined     Chief Complaint  Patient presents with   Medical Management of Chronic Issues    Routine visit.    HPI:  Pt is a 83 y.o. male seen today for medical management of chronic diseases.    He currently resides on the skilled nursing unit at [facility]. PMH: asthma, GERD, 11/04/2023 SBO with obstruction/necrosis s/p laparotomy with bowel resection, lysis of adhesions, hernia repair and left urethral ligation, h/o cystectomy with urostomy from bladder cancer, BPH, CKD, hydronephrosis, anemia, depression and anxiety.   S/p ileal conduit with urostomy- followed by urology>Dr. 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 Alethea Andes prn CKD- h/o AKI last hospitalization 02/24-03/06, BUN/creat 37/2.74, GFR 23 12/30/2023 Anemia of chronic disease- hgb 9.3 12/30/2023, remains on  ferrous sulfate  Depression- Na+ 141 12/30/2023, remains on sertraline  GERD- remains on pantoprazole  HLD- off statin at this time   No recent falls or injuries.   Working with PT/OT.   Plans to discharge to [place] 02/10/2024.   Recent weights:  05/14- 171.6 lbs  04/30- 174.1 lbs  04/16- 176.2 lbs  Recent blood pressures:  05/20- 116/77  05/13- 115/64  05/06- 145/78   Past Medical History:  Diagnosis Date   Bladder cancer (HCC)    Chronic kidney disease    Dementia (HCC)    History of kidney stones    Medical history non-contributory    Meningitis    hx of as a child affected right eye   Past Surgical History:  Procedure Laterality Date   ciliodional cyst  yrs ago   COLONOSCOPY WITH PROPOFOL  N/A 02/26/2017   Procedure: COLONOSCOPY WITH PROPOFOL ;  Surgeon: Garrett Kallman, MD;  Location: [facility];  Service: Endoscopy;  Laterality: N/A;   colonscopy  2008   HERNIA REPAIR     LAPAROTOMY N/A 11/05/2023   Procedure: EXPLORATORY LAPAROTOMY; lysis of adhesions; small bowel resection; ligation of left ureter; primary hernia repair;  Surgeon: Caralyn Chandler, MD;  Location: [facility];  Service: General;  Laterality: N/A;   LYMPH NODE DISSECTION Bilateral 09/22/2021   Procedure: LYMPH NODE DISSECTION;  Surgeon: Osborn Blaze, MD;  Location: [facility];  Service: Urology;  Laterality: Bilateral;   NEPHROLITHOTOMY Right 03/30/2022   Procedure: NEPHROLITHOTOMY PERCUTANEOUS;  Surgeon: Osborn Blaze, MD;  Location: [facility];  Service: Urology;  Laterality: Right;  2 HRS   NEPHROLITHOTOMY Right 03/31/2022   Procedure: RIGHT ANTEGRADE NEPHROGRAM; NEPHROSTOMY TUBE PLACEMENT;  Surgeon: Osborn Blaze, MD;  Location: [facility];  Service: Urology;  Laterality: Right;   ROBOT ASSISTED LAPAROSCOPIC COMPLETE CYSTECT ILEAL CONDUIT N/A 09/22/2021   Procedure: XI ROBOTIC ASSISTED LAPAROSCOPIC COMPLETE CYSTECT ILEAL CONDUIT WITH INDOCYANINE GREEN DYE;  Surgeon: Osborn Blaze, MD;  Location: [facility];   Service: Urology;  Laterality: N/A;  6 HRS   ROBOT ASSISTED LAPAROSCOPIC RADICAL PROSTATECTOMY N/A 09/22/2021   Procedure: XI ROBOTIC ASSISTED LAPAROSCOPIC RADICAL PROSTATECTOMY;  Surgeon: Osborn Blaze, MD;  [facility]: [facility];  Service: Urology;  Laterality: N/A;   TONSILLECTOMY  as child   TRANSURETHRAL RESECTION OF BLADDER TUMOR WITH MITOMYCIN -C N/A 05/02/2021   Procedure: TRANSURETHRAL RESECTION OF BLADDER TUMOR WITH GEMCITABINE ;  Surgeon: Homero Luster, MD;  [facility]: [facility];  Service: Urology;  Laterality: N/A;  GENERAL ANESTHESIA WITH PARALYSIS   UMBILICAL HERNIA REPAIR N/A 09/22/2021   Procedure: OPEN HERNIA REPAIR UMBILICAL ADULT;  Surgeon: Osborn Blaze, MD;  [facility]: [facility];  Service: Urology;  Laterality: N/A;    No Known Allergies  Outpatient Encounter Medications as of 01/29/2024  Medication Sig   Iron , Ferrous Sulfate , 325 (65 Fe) MG TABS Take 325 mg by mouth 3 (three) times a week.   ketoconazole (NIZORAL) 2 % shampoo Apply 1 application  topically as needed for irritation.   Multiple Vitamins-Minerals (MULTIVITAMIN GUMMIES ADULT PO) Take 1 tablet by mouth in the morning.   pantoprazole  (PROTONIX ) 40 MG tablet Take 1 tablet (40 mg total) by mouth daily.   sertraline  (ZOLOFT ) 50 MG tablet Take 50 mg by mouth in the morning.   Zinc Oxide 20 % PSTE Apply topically as needed.   [Paused] atorvastatin  (LIPITOR) 10 MG tablet Take 5 mg by mouth in the morning. (Patient not taking: Reported on 01/29/2024)   lidocaine  (LIDODERM ) 5 % Place 1 patch onto the skin daily. Remove & Discard patch within 12 hours or as directed by MD (Patient not taking: Reported on 01/29/2024)   No facility-administered encounter medications on file as of 01/29/2024.    Review of Systems  Constitutional:  Negative for fatigue and fever.  HENT:  Negative for sore throat and trouble swallowing.   Eyes:  Negative for visual disturbance.  Respiratory:  Negative for cough and shortness of breath.    Cardiovascular:  Negative for chest pain and leg swelling.  Gastrointestinal:  Negative for abdominal distention and abdominal pain.  Genitourinary:  Negative for hematuria.  Musculoskeletal:  Positive for gait problem.  Skin:  Negative for wound.  Neurological:  Negative for dizziness, weakness and light-headedness.  Psychiatric/Behavioral:  Positive for dysphoric mood. Negative for confusion and sleep disturbance. The patient is not nervous/anxious.     Immunization History  Administered Date(s) Administered   Influenza, High Dose Seasonal PF 07/03/2023   PFIZER Comirnaty(Gray Top)Covid-19 Tri-Sucrose Vaccine 01/06/2021   PFIZER(Purple Top)SARS-COV-2 Vaccination 09/30/2019, 10/19/2019, 06/07/2020   Pfizer Covid-19 Vaccine Bivalent Booster 4yrs & up 06/07/2021   Pneumococcal Conjugate,unspecified 01/08/2023   RSV,unspecified 07/12/2022   Unspecified SARS-COV-2 Vaccination 06/15/2022, 07/03/2023   Zoster Recombinant(Shingrix) 09/04/2018, 04/15/2019   Pertinent  Health Maintenance Due  Topic Date Due   INFLUENZA VACCINE  04/10/2024      03/31/2022    8:39 AM 03/31/2022    8:30 PM 04/01/2022    8:00 AM 04/01/2022    9:30 PM 04/02/2022    8:00 AM  Fall Risk  (RETIRED) Patient Fall Risk Level Moderate fall risk Moderate fall risk Moderate fall risk Moderate fall risk Low fall risk   Functional Status Survey:    Vitals:   01/29/24 1045  BP: 116/77  Pulse: 74  Resp: 18  Temp: (!) 96.5 F (35.8 C)  SpO2: 95%  Weight: 171 lb 9.6 oz (77.8 kg)  Height: 5\' 11"  (1.803 m)   Body mass index is 23.93 kg/m. Physical Exam Vitals reviewed.  Constitutional:      General: He is not in acute distress. HENT:     Head: Normocephalic.  Eyes:     General:        Right eye: No discharge.        Left eye: No discharge.  Cardiovascular:     Rate and Rhythm: Normal rate and regular rhythm.     Pulses: Normal pulses.     Heart sounds: Normal heart sounds.  Pulmonary:     Effort:  Pulmonary effort is normal.     Breath sounds: Normal breath sounds.  Abdominal:     General: Bowel sounds are normal. There is no distension.     Palpations: Abdomen is soft.     Tenderness: There is no abdominal tenderness.  Genitourinary:    Comments: Urostomy intact, stoma beefy red, UOP normal, surrounding skin intact Musculoskeletal:        General: Normal range of motion.     Cervical back: Neck supple.  Skin:    General: Skin is warm.  Neurological:     General: No focal deficit present.     Mental Status: He is alert and oriented to person, place, and time.     Gait: Gait abnormal.  Psychiatric:        Mood and Affect: Mood normal.     Labs reviewed: Recent Labs    11/09/23 1718 11/10/23 0751 11/11/23 0400 11/12/23 0430 11/13/23 0358 11/14/23 0414  NA  --  153*   < > 137 137 139  K  --  4.5   < > 3.9 4.4 4.4  CL  --  121*   < > 107 109 111  CO2  --  23   < > 21* 20* 22  GLUCOSE  --  136*   < > 109* 123* 107*  BUN  --  51*   < > 38* 40* 38*  CREATININE  --  2.31*   < > 2.08* 2.09* 2.12*  CALCIUM   --  8.4*   < > 7.5* 7.8* 8.2*  MG 2.5* 2.5*  --   --  2.1  --   PHOS 2.6 2.8  --   --  3.3  --    < > = values in this interval not displayed.   Recent Labs    11/12/23 0430 11/13/23 0358 11/14/23 0414  AST 60* 86* 64*  ALT 90* 113* 108*  ALKPHOS 268* 267* 238*  BILITOT 0.6 0.5 0.6  PROT 5.0* 5.4* 5.8*  ALBUMIN  2.1* 2.2* 2.2*   Recent Labs    11/04/23 1931 11/05/23 0537 11/07/23 0516 11/08/23 0535 11/12/23 0430 11/13/23 0358 11/14/23 0414  WBC 8.3   < > 13.1*   < > 11.1* 10.2 9.0  NEUTROABS 6.9  --  12.6*  --   --   --   --   HGB 14.1   < > 9.4*   < > 9.2* 9.0* 9.1*  HCT 42.7   < > 29.0*   < > 29.2* 29.4* 29.5*  MCV 85.6   < > 86.1   < > 88.8 89.1 89.4  PLT 146*   < > 49*   < > 134* 166 193   < > =  values in this interval not displayed.   No results found for: "TSH" No results found for: "HGBA1C" Lab Results  Component Value Date   TRIG 493  (H) 11/06/2023    Significant Diagnostic Results in last 30 days:  No results found.  Assessment/Plan 1. S/P ileal conduit (HCC) (Primary) - followed by urology> Dr. Secundino Dach - associated with aggressive bladder/prostate cancer - urostomy intact, good UOP  2. S/P exploratory laparotomy - s/p bowel resection due to ischemic necrosis 10/2023 - followed by Dr. Alethea Andes last seen 05/02 - tolerating diet - not pain - moving bowels  - MLS healed - f/u with general surgery prn - cont PT/OT - plans to [place] 06/02 [place]   3. Stage 4 chronic kidney disease (HCC) - encourage hydration  - avoid NSAIDS  4. Anemia of chronic disease - hgb stable - cont ferrous sulfate   5. Recurrent depression (HCC) - stable mood - Na+ stable - cont sertraline   6. Gastroesophageal reflux disease without esophagitis - cont pantoprazole   7. Hyperlipidemia, unspecified hyperlipidemia type - off statin    Family/ staff Communication: plan discussed with patient and nurse  Labs/tests ordered:  none

## 2024-01-30 DIAGNOSIS — R262 Difficulty in walking, not elsewhere classified: Secondary | ICD-10-CM | POA: Diagnosis not present

## 2024-01-30 DIAGNOSIS — M6281 Muscle weakness (generalized): Secondary | ICD-10-CM | POA: Diagnosis not present

## 2024-02-03 DIAGNOSIS — Z905 Acquired absence of kidney: Secondary | ICD-10-CM | POA: Diagnosis not present

## 2024-02-03 DIAGNOSIS — M6281 Muscle weakness (generalized): Secondary | ICD-10-CM | POA: Diagnosis not present

## 2024-02-04 DIAGNOSIS — M6281 Muscle weakness (generalized): Secondary | ICD-10-CM | POA: Diagnosis not present

## 2024-02-04 DIAGNOSIS — R262 Difficulty in walking, not elsewhere classified: Secondary | ICD-10-CM | POA: Diagnosis not present

## 2024-02-05 DIAGNOSIS — M6281 Muscle weakness (generalized): Secondary | ICD-10-CM | POA: Diagnosis not present

## 2024-02-05 DIAGNOSIS — Z905 Acquired absence of kidney: Secondary | ICD-10-CM | POA: Diagnosis not present

## 2024-02-06 ENCOUNTER — Non-Acute Institutional Stay (SKILLED_NURSING_FACILITY): Payer: Self-pay | Admitting: Internal Medicine

## 2024-02-06 DIAGNOSIS — F339 Major depressive disorder, recurrent, unspecified: Secondary | ICD-10-CM

## 2024-02-06 DIAGNOSIS — K219 Gastro-esophageal reflux disease without esophagitis: Secondary | ICD-10-CM

## 2024-02-06 DIAGNOSIS — Z936 Other artificial openings of urinary tract status: Secondary | ICD-10-CM

## 2024-02-06 DIAGNOSIS — E785 Hyperlipidemia, unspecified: Secondary | ICD-10-CM | POA: Diagnosis not present

## 2024-02-06 DIAGNOSIS — D638 Anemia in other chronic diseases classified elsewhere: Secondary | ICD-10-CM | POA: Diagnosis not present

## 2024-02-06 DIAGNOSIS — R262 Difficulty in walking, not elsewhere classified: Secondary | ICD-10-CM | POA: Diagnosis not present

## 2024-02-06 DIAGNOSIS — Z9889 Other specified postprocedural states: Secondary | ICD-10-CM | POA: Diagnosis not present

## 2024-02-06 DIAGNOSIS — M6281 Muscle weakness (generalized): Secondary | ICD-10-CM | POA: Diagnosis not present

## 2024-02-06 DIAGNOSIS — N184 Chronic kidney disease, stage 4 (severe): Secondary | ICD-10-CM | POA: Diagnosis not present

## 2024-02-06 NOTE — Progress Notes (Unsigned)
 [facility]  SNF (31)  Provider:   Code Status:  Goals of Care:     01/29/2024   10:51 AM  Advanced Directives  Does Patient Have a Medical Advance Directive? No  Would patient like information on creating a medical advance directive? No - Patient declined     Chief Complaint  Patient presents with   Discharge Note    HPI: Patient is a 83 y.o. male seen today for Discharge From [facility] ot his Apartment in [facility]  Patient has h/o H/o Cystectomy and has Urostomy for Bladder cancer, h/o CKD 4 and HLD   Admitted in the hospital from 02/24-03/06 for SBO with necrosis needing Laparotomy and Left Urethral Ligation   Post op Complicated with Encephalopathy Sepsis and Wound infection   Eventually was discharged to [facility] for Rehab  And has been in [place] for therapy  He contineus to do well  Walking with his walker No Pain Appetite is good Wound has healed completely Bowels are moving well  Past Medical History:  Diagnosis Date   Bladder cancer (HCC)    Chronic kidney disease    Dementia (HCC)    History of kidney stones    Medical history non-contributory    Meningitis    hx of as a child affected right eye    Past Surgical History:  Procedure Laterality Date   ciliodional cyst  yrs ago   COLONOSCOPY WITH PROPOFOL  N/A 02/26/2017   Procedure: COLONOSCOPY WITH PROPOFOL ;  Surgeon: Garrett Kallman, MD;  [facility];  Service: Endoscopy;  Laterality: N/A;   colonscopy  2008   HERNIA REPAIR     LAPAROTOMY N/A 11/05/2023   Procedure: EXPLORATORY LAPAROTOMY; lysis of adhesions; small bowel resection; ligation of left ureter; primary hernia repair;  Surgeon: Caralyn Chandler, MD;  [facility]: [facility];  Service: General;  Laterality: N/A;   LYMPH NODE DISSECTION Bilateral 09/22/2021   Procedure: LYMPH NODE DISSECTION;  Surgeon: Osborn Blaze, MD;  [facility]: [facility];  Service: Urology;  Laterality: Bilateral;   NEPHROLITHOTOMY  Right 03/30/2022   Procedure: NEPHROLITHOTOMY PERCUTANEOUS;  Surgeon: Osborn Blaze, MD;  [facility]: [facility];  Service: Urology;  Laterality: Right;  2 HRS   NEPHROLITHOTOMY Right 03/31/2022   Procedure: RIGHT ANTEGRADE NEPHROGRAM; NEPHROSTOMY TUBE PLACEMENT;  Surgeon: Osborn Blaze, MD;  [facility];  Service: Urology;  Laterality: Right;   ROBOT ASSISTED LAPAROSCOPIC COMPLETE CYSTECT ILEAL CONDUIT N/A 09/22/2021   Procedure: XI ROBOTIC ASSISTED LAPAROSCOPIC COMPLETE CYSTECT ILEAL CONDUIT WITH INDOCYANINE GREEN DYE;  Surgeon: Osborn Blaze, MD;  [facility];  Service: Urology;  Laterality: N/A;  6 HRS   ROBOT ASSISTED LAPAROSCOPIC RADICAL PROSTATECTOMY N/A 09/22/2021   Procedure: XI ROBOTIC ASSISTED LAPAROSCOPIC RADICAL PROSTATECTOMY;  Surgeon: Osborn Blaze, MD;  [facility];  Service: Urology;  Laterality: N/A;   TONSILLECTOMY  as child   TRANSURETHRAL RESECTION OF BLADDER TUMOR WITH MITOMYCIN -C N/A 05/02/2021   Procedure: TRANSURETHRAL RESECTION OF BLADDER TUMOR WITH GEMCITABINE ;  Surgeon: Homero Luster, MD;  [facility];  Service: Urology;  Laterality: N/A;  GENERAL ANESTHESIA WITH PARALYSIS   UMBILICAL HERNIA REPAIR N/A 09/22/2021   Procedure: OPEN HERNIA REPAIR UMBILICAL ADULT;  Surgeon: Osborn Blaze, MD;  [facility]: [facility];  Service: Urology;  Laterality: N/A;    No Known Allergies  Outpatient Encounter Medications as of 02/06/2024  Medication Sig   Iron , Ferrous Sulfate , 325 (65 Fe) MG TABS Take 325 mg by  mouth 3 (three) times a week.   ketoconazole (NIZORAL) 2 % shampoo Apply 1 application  topically as needed for irritation.   Multiple Vitamins-Minerals (MULTIVITAMIN GUMMIES ADULT PO) Take 1 tablet by mouth in the morning.   pantoprazole  (PROTONIX ) 40 MG tablet Take 1 tablet (40 mg total) by mouth daily.   sertraline  (ZOLOFT ) 50 MG tablet Take 50 mg by mouth in the morning.   Zinc Oxide 20 % PSTE Apply topically as needed.   No facility-administered encounter  medications on file as of 02/06/2024.    Review of Systems:  Review of Systems  Health Maintenance  Topic Date Due   DTaP/Tdap/Td (1 - Tdap) Never done   Pneumonia Vaccine 69+ Years old (1 of 2 - PCV) 03/26/1960   Lung Cancer Screening  Never done   COVID-19 Vaccine (8 - Pfizer risk 2024-25 season) 01/01/2024   Medicare Annual Wellness (AWV)  01/01/2024   INFLUENZA VACCINE  04/10/2024   Zoster Vaccines- Shingrix  Completed   HPV VACCINES  Aged Out   Meningococcal B Vaccine  Aged Out    Physical Exam: There were no vitals filed for this visit. There is no height or weight on file to calculate BMI. Physical Exam Vitals reviewed.  Constitutional:      Appearance: Normal appearance.  HENT:     Head: Normocephalic.     Nose: Nose normal.     Mouth/Throat:     Mouth: Mucous membranes are moist.     Pharynx: Oropharynx is clear.  Eyes:     Pupils: Pupils are equal, round, and reactive to light.  Cardiovascular:     Rate and Rhythm: Normal rate and regular rhythm.     Pulses: Normal pulses.     Heart sounds: No murmur heard. Pulmonary:     Effort: Pulmonary effort is normal. No respiratory distress.     Breath sounds: Normal breath sounds. No rales.  Abdominal:     General: Abdomen is flat. Bowel sounds are normal.     Palpations: Abdomen is soft.     Comments: Midline scar  Musculoskeletal:        General: No swelling.     Cervical back: Neck supple.  Skin:    General: Skin is warm.  Neurological:     General: No focal deficit present.     Mental Status: He is alert and oriented to person, place, and time.  Psychiatric:        Mood and Affect: Mood normal.        Thought Content: Thought content normal.     Labs reviewed: Basic Metabolic Panel: Recent Labs    11/09/23 1718 11/10/23 0751 11/11/23 0400 11/12/23 0430 11/13/23 0358 11/14/23 0414  NA  --  153*   < > 137 137 139  K  --  4.5   < > 3.9 4.4 4.4  CL  --  121*   < > 107 109 111  CO2  --  23   < >  21* 20* 22  GLUCOSE  --  136*   < > 109* 123* 107*  BUN  --  51*   < > 38* 40* 38*  CREATININE  --  2.31*   < > 2.08* 2.09* 2.12*  CALCIUM   --  8.4*   < > 7.5* 7.8* 8.2*  MG 2.5* 2.5*  --   --  2.1  --   PHOS 2.6 2.8  --   --  3.3  --    < > =  values in this interval not displayed.   Liver Function Tests: Recent Labs    11/12/23 0430 11/13/23 0358 11/14/23 0414  AST 60* 86* 64*  ALT 90* 113* 108*  ALKPHOS 268* 267* 238*  BILITOT 0.6 0.5 0.6  PROT 5.0* 5.4* 5.8*  ALBUMIN  2.1* 2.2* 2.2*   No results for input(s): "LIPASE", "AMYLASE" in the last 8760 hours. No results for input(s): "AMMONIA" in the last 8760 hours. CBC: Recent Labs    11/04/23 1931 11/05/23 0537 11/07/23 0516 11/08/23 0535 11/12/23 0430 11/13/23 0358 11/14/23 0414  WBC 8.3   < > 13.1*   < > 11.1* 10.2 9.0  NEUTROABS 6.9  --  12.6*  --   --   --   --   HGB 14.1   < > 9.4*   < > 9.2* 9.0* 9.1*  HCT 42.7   < > 29.0*   < > 29.2* 29.4* 29.5*  MCV 85.6   < > 86.1   < > 88.8 89.1 89.4  PLT 146*   < > 49*   < > 134* 166 193   < > = values in this interval not displayed.   Lipid Panel: Recent Labs    11/06/23 0314  TRIG 493*   No results found for: "HGBA1C"  Procedures since last visit: No results found.  Assessment/Plan 1. S/P exploratory laparotomy (Primary) Doing well Discharged from Surgery  2. Stage 4 chronic kidney disease (HCC) Patient is establish with Renal already Plan to see them in few weeks  3. Anemia of chronic disease HGB stable on iron  Iron  Stores in [place] blood work were 14% with Low ferritin  4. Recurrent depression (HCC) Zoloft   5. Gastroesophageal reflux disease without esophagitis Protonix    6 S/P ileal conduit (HCC)  7 Hyperlipidemia, unspecified hyperlipidemia type Statin on hold due to ? LFTS were high Need to d/w PCP to restart them    Labs/tests ordered:  * No order type specified * Next appt:  Visit date not found

## 2024-02-09 DIAGNOSIS — Z905 Acquired absence of kidney: Secondary | ICD-10-CM | POA: Diagnosis not present

## 2024-02-09 DIAGNOSIS — M6281 Muscle weakness (generalized): Secondary | ICD-10-CM | POA: Diagnosis not present

## 2024-02-11 DIAGNOSIS — M6281 Muscle weakness (generalized): Secondary | ICD-10-CM | POA: Diagnosis not present

## 2024-02-11 DIAGNOSIS — M25562 Pain in left knee: Secondary | ICD-10-CM | POA: Diagnosis not present

## 2024-02-11 DIAGNOSIS — M5459 Other low back pain: Secondary | ICD-10-CM | POA: Diagnosis not present

## 2024-02-11 DIAGNOSIS — M25561 Pain in right knee: Secondary | ICD-10-CM | POA: Diagnosis not present

## 2024-02-12 DIAGNOSIS — M6281 Muscle weakness (generalized): Secondary | ICD-10-CM | POA: Diagnosis not present

## 2024-02-12 DIAGNOSIS — Z905 Acquired absence of kidney: Secondary | ICD-10-CM | POA: Diagnosis not present

## 2024-02-13 DIAGNOSIS — M25561 Pain in right knee: Secondary | ICD-10-CM | POA: Diagnosis not present

## 2024-02-13 DIAGNOSIS — M5459 Other low back pain: Secondary | ICD-10-CM | POA: Diagnosis not present

## 2024-02-13 DIAGNOSIS — M6281 Muscle weakness (generalized): Secondary | ICD-10-CM | POA: Diagnosis not present

## 2024-02-13 DIAGNOSIS — M25562 Pain in left knee: Secondary | ICD-10-CM | POA: Diagnosis not present

## 2024-02-14 DIAGNOSIS — Z79899 Other long term (current) drug therapy: Secondary | ICD-10-CM | POA: Diagnosis not present

## 2024-02-14 DIAGNOSIS — D509 Iron deficiency anemia, unspecified: Secondary | ICD-10-CM | POA: Diagnosis not present

## 2024-02-14 DIAGNOSIS — Z0000 Encounter for general adult medical examination without abnormal findings: Secondary | ICD-10-CM | POA: Diagnosis not present

## 2024-02-14 DIAGNOSIS — N184 Chronic kidney disease, stage 4 (severe): Secondary | ICD-10-CM | POA: Diagnosis not present

## 2024-02-14 DIAGNOSIS — E782 Mixed hyperlipidemia: Secondary | ICD-10-CM | POA: Diagnosis not present

## 2024-02-14 DIAGNOSIS — Z8719 Personal history of other diseases of the digestive system: Secondary | ICD-10-CM | POA: Diagnosis not present

## 2024-02-14 DIAGNOSIS — Z8551 Personal history of malignant neoplasm of bladder: Secondary | ICD-10-CM | POA: Diagnosis not present

## 2024-02-18 DIAGNOSIS — M6281 Muscle weakness (generalized): Secondary | ICD-10-CM | POA: Diagnosis not present

## 2024-02-18 DIAGNOSIS — M25562 Pain in left knee: Secondary | ICD-10-CM | POA: Diagnosis not present

## 2024-02-18 DIAGNOSIS — Z905 Acquired absence of kidney: Secondary | ICD-10-CM | POA: Diagnosis not present

## 2024-02-18 DIAGNOSIS — M5459 Other low back pain: Secondary | ICD-10-CM | POA: Diagnosis not present

## 2024-02-18 DIAGNOSIS — M25561 Pain in right knee: Secondary | ICD-10-CM | POA: Diagnosis not present

## 2024-02-19 DIAGNOSIS — M25561 Pain in right knee: Secondary | ICD-10-CM | POA: Diagnosis not present

## 2024-02-19 DIAGNOSIS — M25562 Pain in left knee: Secondary | ICD-10-CM | POA: Diagnosis not present

## 2024-02-19 DIAGNOSIS — Z905 Acquired absence of kidney: Secondary | ICD-10-CM | POA: Diagnosis not present

## 2024-02-19 DIAGNOSIS — M6281 Muscle weakness (generalized): Secondary | ICD-10-CM | POA: Diagnosis not present

## 2024-02-19 DIAGNOSIS — M5459 Other low back pain: Secondary | ICD-10-CM | POA: Diagnosis not present

## 2024-02-24 DIAGNOSIS — Z905 Acquired absence of kidney: Secondary | ICD-10-CM | POA: Diagnosis not present

## 2024-02-24 DIAGNOSIS — M6281 Muscle weakness (generalized): Secondary | ICD-10-CM | POA: Diagnosis not present

## 2024-02-26 DIAGNOSIS — M25561 Pain in right knee: Secondary | ICD-10-CM | POA: Diagnosis not present

## 2024-02-26 DIAGNOSIS — M6281 Muscle weakness (generalized): Secondary | ICD-10-CM | POA: Diagnosis not present

## 2024-02-26 DIAGNOSIS — M5459 Other low back pain: Secondary | ICD-10-CM | POA: Diagnosis not present

## 2024-02-26 DIAGNOSIS — M25562 Pain in left knee: Secondary | ICD-10-CM | POA: Diagnosis not present

## 2024-02-27 DIAGNOSIS — Z905 Acquired absence of kidney: Secondary | ICD-10-CM | POA: Diagnosis not present

## 2024-02-27 DIAGNOSIS — M6281 Muscle weakness (generalized): Secondary | ICD-10-CM | POA: Diagnosis not present

## 2024-02-28 DIAGNOSIS — M5459 Other low back pain: Secondary | ICD-10-CM | POA: Diagnosis not present

## 2024-02-28 DIAGNOSIS — M25562 Pain in left knee: Secondary | ICD-10-CM | POA: Diagnosis not present

## 2024-02-28 DIAGNOSIS — M6281 Muscle weakness (generalized): Secondary | ICD-10-CM | POA: Diagnosis not present

## 2024-02-28 DIAGNOSIS — M25561 Pain in right knee: Secondary | ICD-10-CM | POA: Diagnosis not present

## 2024-03-02 DIAGNOSIS — Z905 Acquired absence of kidney: Secondary | ICD-10-CM | POA: Diagnosis not present

## 2024-03-02 DIAGNOSIS — M6281 Muscle weakness (generalized): Secondary | ICD-10-CM | POA: Diagnosis not present

## 2024-03-04 DIAGNOSIS — M5459 Other low back pain: Secondary | ICD-10-CM | POA: Diagnosis not present

## 2024-03-04 DIAGNOSIS — M6281 Muscle weakness (generalized): Secondary | ICD-10-CM | POA: Diagnosis not present

## 2024-03-04 DIAGNOSIS — M25562 Pain in left knee: Secondary | ICD-10-CM | POA: Diagnosis not present

## 2024-03-04 DIAGNOSIS — M25561 Pain in right knee: Secondary | ICD-10-CM | POA: Diagnosis not present

## 2024-03-05 DIAGNOSIS — Z905 Acquired absence of kidney: Secondary | ICD-10-CM | POA: Diagnosis not present

## 2024-03-05 DIAGNOSIS — M6281 Muscle weakness (generalized): Secondary | ICD-10-CM | POA: Diagnosis not present

## 2024-03-06 DIAGNOSIS — M5459 Other low back pain: Secondary | ICD-10-CM | POA: Diagnosis not present

## 2024-03-06 DIAGNOSIS — M25562 Pain in left knee: Secondary | ICD-10-CM | POA: Diagnosis not present

## 2024-03-06 DIAGNOSIS — M6281 Muscle weakness (generalized): Secondary | ICD-10-CM | POA: Diagnosis not present

## 2024-03-06 DIAGNOSIS — M25561 Pain in right knee: Secondary | ICD-10-CM | POA: Diagnosis not present

## 2024-03-09 DIAGNOSIS — Z905 Acquired absence of kidney: Secondary | ICD-10-CM | POA: Diagnosis not present

## 2024-03-09 DIAGNOSIS — M6281 Muscle weakness (generalized): Secondary | ICD-10-CM | POA: Diagnosis not present

## 2024-03-16 DIAGNOSIS — N184 Chronic kidney disease, stage 4 (severe): Secondary | ICD-10-CM | POA: Diagnosis not present

## 2024-03-16 DIAGNOSIS — N139 Obstructive and reflux uropathy, unspecified: Secondary | ICD-10-CM | POA: Diagnosis not present

## 2024-03-16 DIAGNOSIS — D631 Anemia in chronic kidney disease: Secondary | ICD-10-CM | POA: Diagnosis not present

## 2024-03-16 DIAGNOSIS — N390 Urinary tract infection, site not specified: Secondary | ICD-10-CM | POA: Diagnosis not present

## 2024-03-18 DIAGNOSIS — M6281 Muscle weakness (generalized): Secondary | ICD-10-CM | POA: Diagnosis not present

## 2024-03-18 DIAGNOSIS — M25562 Pain in left knee: Secondary | ICD-10-CM | POA: Diagnosis not present

## 2024-03-18 DIAGNOSIS — M5459 Other low back pain: Secondary | ICD-10-CM | POA: Diagnosis not present

## 2024-03-18 DIAGNOSIS — M25561 Pain in right knee: Secondary | ICD-10-CM | POA: Diagnosis not present

## 2024-03-23 DIAGNOSIS — M25562 Pain in left knee: Secondary | ICD-10-CM | POA: Diagnosis not present

## 2024-03-23 DIAGNOSIS — M6281 Muscle weakness (generalized): Secondary | ICD-10-CM | POA: Diagnosis not present

## 2024-03-23 DIAGNOSIS — M5459 Other low back pain: Secondary | ICD-10-CM | POA: Diagnosis not present

## 2024-03-23 DIAGNOSIS — M25561 Pain in right knee: Secondary | ICD-10-CM | POA: Diagnosis not present

## 2024-03-25 DIAGNOSIS — M25561 Pain in right knee: Secondary | ICD-10-CM | POA: Diagnosis not present

## 2024-03-25 DIAGNOSIS — M25562 Pain in left knee: Secondary | ICD-10-CM | POA: Diagnosis not present

## 2024-03-25 DIAGNOSIS — M5459 Other low back pain: Secondary | ICD-10-CM | POA: Diagnosis not present

## 2024-03-25 DIAGNOSIS — M6281 Muscle weakness (generalized): Secondary | ICD-10-CM | POA: Diagnosis not present

## 2024-03-27 DIAGNOSIS — M25562 Pain in left knee: Secondary | ICD-10-CM | POA: Diagnosis not present

## 2024-03-27 DIAGNOSIS — M25561 Pain in right knee: Secondary | ICD-10-CM | POA: Diagnosis not present

## 2024-03-27 DIAGNOSIS — M5459 Other low back pain: Secondary | ICD-10-CM | POA: Diagnosis not present

## 2024-03-27 DIAGNOSIS — M6281 Muscle weakness (generalized): Secondary | ICD-10-CM | POA: Diagnosis not present

## 2024-04-06 DIAGNOSIS — R509 Fever, unspecified: Secondary | ICD-10-CM | POA: Diagnosis not present

## 2024-06-18 DIAGNOSIS — N139 Obstructive and reflux uropathy, unspecified: Secondary | ICD-10-CM | POA: Diagnosis not present

## 2024-06-18 DIAGNOSIS — N184 Chronic kidney disease, stage 4 (severe): Secondary | ICD-10-CM | POA: Diagnosis not present

## 2024-06-18 DIAGNOSIS — D631 Anemia in chronic kidney disease: Secondary | ICD-10-CM | POA: Diagnosis not present

## 2024-06-18 DIAGNOSIS — E875 Hyperkalemia: Secondary | ICD-10-CM | POA: Diagnosis not present

## 2024-06-24 DIAGNOSIS — H52203 Unspecified astigmatism, bilateral: Secondary | ICD-10-CM | POA: Diagnosis not present

## 2024-06-24 DIAGNOSIS — Z961 Presence of intraocular lens: Secondary | ICD-10-CM | POA: Diagnosis not present

## 2024-06-24 DIAGNOSIS — H53001 Unspecified amblyopia, right eye: Secondary | ICD-10-CM | POA: Diagnosis not present

## 2024-06-24 DIAGNOSIS — H2511 Age-related nuclear cataract, right eye: Secondary | ICD-10-CM | POA: Diagnosis not present

## 2024-08-17 ENCOUNTER — Other Ambulatory Visit (HOSPITAL_COMMUNITY): Payer: Self-pay | Admitting: Urology

## 2024-08-17 ENCOUNTER — Ambulatory Visit (HOSPITAL_COMMUNITY)
Admission: RE | Admit: 2024-08-17 | Discharge: 2024-08-17 | Disposition: A | Source: Ambulatory Visit | Attending: Urology | Admitting: Urology

## 2024-08-17 DIAGNOSIS — C61 Malignant neoplasm of prostate: Secondary | ICD-10-CM
